# Patient Record
Sex: Male | Born: 1955
Health system: Southern US, Community
[De-identification: ages and names within clinical notes are randomized; demographics above are authoritative.]

## PROBLEM LIST (undated history)

## (undated) DIAGNOSIS — K5790 Diverticulosis of intestine, part unspecified, without perforation or abscess without bleeding: Secondary | ICD-10-CM

## (undated) DIAGNOSIS — K219 Gastro-esophageal reflux disease without esophagitis: Secondary | ICD-10-CM

## (undated) DIAGNOSIS — E785 Hyperlipidemia, unspecified: Secondary | ICD-10-CM

## (undated) DIAGNOSIS — K635 Polyp of colon: Secondary | ICD-10-CM

## (undated) DIAGNOSIS — Z800 Family history of malignant neoplasm of digestive organs: Secondary | ICD-10-CM

## (undated) DIAGNOSIS — I2510 Atherosclerotic heart disease of native coronary artery without angina pectoris: Secondary | ICD-10-CM

## (undated) DIAGNOSIS — I509 Heart failure, unspecified: Secondary | ICD-10-CM

## (undated) DIAGNOSIS — R0600 Dyspnea, unspecified: Secondary | ICD-10-CM

## (undated) DIAGNOSIS — I10 Essential (primary) hypertension: Secondary | ICD-10-CM

## (undated) DIAGNOSIS — I219 Acute myocardial infarction, unspecified: Secondary | ICD-10-CM

## (undated) DIAGNOSIS — G4733 Obstructive sleep apnea (adult) (pediatric): Secondary | ICD-10-CM

## (undated) DIAGNOSIS — I4891 Unspecified atrial fibrillation: Secondary | ICD-10-CM

## (undated) HISTORY — DX: Dyspnea, unspecified: R06.00

## (undated) HISTORY — DX: Hyperlipidemia, unspecified: E78.5

## (undated) HISTORY — DX: Essential (primary) hypertension: I10

## (undated) HISTORY — DX: Polyp of colon: K63.5

## (undated) HISTORY — DX: Family history of malignant neoplasm of digestive organs: Z80.0

## (undated) HISTORY — DX: Atherosclerotic heart disease of native coronary artery without angina pectoris: I25.10

## (undated) HISTORY — DX: Obstructive sleep apnea (adult) (pediatric): G47.33

## (undated) HISTORY — DX: Diverticulosis of intestine, part unspecified, without perforation or abscess without bleeding: K57.90

## (undated) HISTORY — DX: Heart failure, unspecified: I50.9

## (undated) HISTORY — DX: Unspecified atrial fibrillation: I48.91

## (undated) HISTORY — PX: CARDIAC CATHETERIZATION: SHX172

---
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
## 2002-03-30 ENCOUNTER — Emergency Department (HOSPITAL_COMMUNITY): Admission: EM | Admit: 2002-03-30 | Discharge: 2002-03-30 | Payer: Self-pay | Admitting: Emergency Medicine

## 2002-09-17 ENCOUNTER — Encounter: Payer: Self-pay | Admitting: Family Medicine

## 2002-09-17 ENCOUNTER — Ambulatory Visit (HOSPITAL_COMMUNITY): Admission: RE | Admit: 2002-09-17 | Discharge: 2002-09-17 | Payer: Self-pay | Admitting: Family Medicine

## 2002-12-01 ENCOUNTER — Encounter: Payer: Self-pay | Admitting: *Deleted

## 2002-12-01 ENCOUNTER — Emergency Department (HOSPITAL_COMMUNITY): Admission: EM | Admit: 2002-12-01 | Discharge: 2002-12-02 | Payer: Self-pay | Admitting: *Deleted

## 2003-09-13 ENCOUNTER — Emergency Department (HOSPITAL_COMMUNITY): Admission: EM | Admit: 2003-09-13 | Discharge: 2003-09-13 | Payer: Self-pay | Admitting: Emergency Medicine

## 2003-09-16 ENCOUNTER — Emergency Department (HOSPITAL_COMMUNITY): Admission: EM | Admit: 2003-09-16 | Discharge: 2003-09-16 | Payer: Self-pay | Admitting: Emergency Medicine

## 2003-09-20 ENCOUNTER — Emergency Department (HOSPITAL_COMMUNITY): Admission: EM | Admit: 2003-09-20 | Discharge: 2003-09-20 | Payer: Self-pay | Admitting: Emergency Medicine

## 2003-09-27 ENCOUNTER — Emergency Department (HOSPITAL_COMMUNITY): Admission: EM | Admit: 2003-09-27 | Discharge: 2003-09-27 | Payer: Self-pay | Admitting: Emergency Medicine

## 2003-10-11 ENCOUNTER — Emergency Department (HOSPITAL_COMMUNITY): Admission: EM | Admit: 2003-10-11 | Discharge: 2003-10-11 | Payer: Self-pay | Admitting: Emergency Medicine

## 2004-06-04 HISTORY — PX: CORONARY ARTERY BYPASS GRAFT: SHX141

## 2004-06-26 ENCOUNTER — Ambulatory Visit (HOSPITAL_COMMUNITY): Admission: RE | Admit: 2004-06-26 | Discharge: 2004-06-26 | Payer: Self-pay | Admitting: Family Medicine

## 2004-06-29 ENCOUNTER — Inpatient Hospital Stay (HOSPITAL_COMMUNITY): Admission: AD | Admit: 2004-06-29 | Discharge: 2004-07-05 | Payer: Self-pay | Admitting: Cardiology

## 2004-07-27 ENCOUNTER — Encounter: Admission: RE | Admit: 2004-07-27 | Discharge: 2004-07-27 | Payer: Self-pay | Admitting: Cardiology

## 2004-09-05 ENCOUNTER — Encounter (HOSPITAL_COMMUNITY): Admission: RE | Admit: 2004-09-05 | Discharge: 2004-10-05 | Payer: Self-pay | Admitting: *Deleted

## 2005-03-26 ENCOUNTER — Ambulatory Visit (HOSPITAL_COMMUNITY): Admission: RE | Admit: 2005-03-26 | Discharge: 2005-03-26 | Payer: Self-pay | Admitting: Family Medicine

## 2007-07-06 HISTORY — PX: COLONOSCOPY: SHX174

## 2007-07-16 ENCOUNTER — Encounter (INDEPENDENT_AMBULATORY_CARE_PROVIDER_SITE_OTHER): Payer: Self-pay | Admitting: General Surgery

## 2007-07-16 ENCOUNTER — Ambulatory Visit (HOSPITAL_COMMUNITY): Admission: RE | Admit: 2007-07-16 | Discharge: 2007-07-16 | Payer: Self-pay | Admitting: General Surgery

## 2007-07-16 DIAGNOSIS — K635 Polyp of colon: Secondary | ICD-10-CM

## 2007-07-16 HISTORY — DX: Polyp of colon: K63.5

## 2007-12-21 DIAGNOSIS — G4733 Obstructive sleep apnea (adult) (pediatric): Secondary | ICD-10-CM

## 2007-12-21 HISTORY — DX: Obstructive sleep apnea (adult) (pediatric): G47.33

## 2009-11-14 ENCOUNTER — Ambulatory Visit (HOSPITAL_COMMUNITY): Admission: RE | Admit: 2009-11-14 | Discharge: 2009-11-14 | Payer: Self-pay | Admitting: Cardiology

## 2009-11-17 ENCOUNTER — Ambulatory Visit (HOSPITAL_COMMUNITY): Admission: RE | Admit: 2009-11-17 | Discharge: 2009-11-17 | Payer: Self-pay | Admitting: Cardiology

## 2010-08-20 LAB — GLUCOSE, CAPILLARY
Glucose-Capillary: 120 mg/dL — ABNORMAL HIGH (ref 70–99)
Glucose-Capillary: 174 mg/dL — ABNORMAL HIGH (ref 70–99)

## 2010-10-17 NOTE — H&P (Signed)
NAME:  Francis Hall, Francis Hall                  ACCOUNT NO.:  [id number]   MEDICAL RECORD NO.:  [id number]          PATIENT TYPE:  AMB   LOCATION:  DAY                           FACILITY:  [facility]   PHYSICIAN:  Dalia Heading, M.D.  DATE OF BIRTH:  08/22/1935   DATE OF ADMISSION:  DATE OF DISCHARGE:  LH                              HISTORY & PHYSICAL   CHIEF COMPLAINT:  Need for screening colonoscopy.   HISTORY OF PRESENT ILLNESS:  The patient is a 55 year old white male who  is referred for endoscopic evaluation.  Needs colonoscopy for screening  purposes.  No abdominal pain, weight loss, nausea, vomiting, diarrhea,  constipation, melena, hematochezia have been noted.  He has never had a  colonoscopy.  There is no family history of colon carcinoma.   PAST MEDICAL HISTORY:  Includes:  1. Coronary artery disease.  2. Diabetes mellitus.   PAST SURGICAL HISTORY:  CABG 3 years ago.   CURRENT MEDICATIONS:  1. Plavix, which he will be holding.  2. Blood pressure pill.  3. Diabetic pill.   ALLERGIES:  No known drug allergies.   REVIEW OF SYSTEMS:  Noncontributory.   PHYSICAL EXAMINATION:  GENERAL:  The patient is a well-developed, well-  nourished, white male in no acute distress.  LUNGS:  Clear to auscultation with equal breath sounds bilaterally.  HEART:  Reveals regular rate and rhythm without S3, S4, or murmurs.  ABDOMEN:  Soft, nontender, nondistended.  No hepatosplenomegaly or  masses are noted.  RECTAL:  Examination was deferred to the procedure.   IMPRESSION:  Need for screening colonoscopy.   PLAN:  The patient is scheduled for a colonoscopy on July 08, 2007.  The risks and benefits of the procedure including bleeding and  perforation were fully explained to the patient, gave informed consent.      Dalia Heading, M.D.  Electronically Signed     MAJ/MEDQ  D:  06/24/2007  T:  06/24/2007  Job:  [id number]   cc:   Dalia Heading, M.D.  Fax: [phone]   Corrie Mckusick,  M.D.  Fax: [phone]

## 2010-10-20 NOTE — Discharge Summary (Signed)
NAMECERRONE, DEBOLD NO.:  [id number]   MEDICAL RECORD NO.:  [id number]          PATIENT TYPE:  INP   LOCATION:  2022                         FACILITY:  [facility]   PHYSICIAN:  Sheliah Plane, MD    DATE OF BIRTH:  27-Jul-1955   DATE OF ADMISSION:  06/29/2004  DATE OF DISCHARGE:                                 DISCHARGE SUMMARY   ADMISSION DIAGNOSIS:  Increasing shortness of breath and chest pain.   PAST MEDICAL HISTORY AND DISCHARGE DIAGNOSES:  1.  Hypertension.  2.  Hyperlipidemia.  3.  Noninsulin-dependent diabetes mellitus.  4.  History of right clavicular fracture.  5.  Status post tonsillectomy.  6.  Status post adenoidectomy.  7.  Two-vessel coronary artery disease, status post coronary artery bypass      graft x 2.  8.  Postoperative anemia.   ALLERGIES:  No known drug allergies.   BRIEF HISTORY:  The patient is a 55 year old Caucasian male with known  severe dyslipidemia, hypertension and noninsulin-dependent diabetes  mellitus, who had experienced symptoms of vague shortness of breath and  chest tightness for quite some time.  Over the past several weeks prior to  admission, however, the patient began to note that this was increasing.  Several weeks ago, the patient did have one episode where he was lifting a  tire and he became very dyspneic.  The episodes have increased since that  time, always with exertion and relieved with rest.  The patient denied  diaphoresis associated with any of these episodes.  He had an echocardiogram  performed in the past which revealed an ejection fraction of 51%, trivial  mild regurgitation and mild aortic sclerosis without stenosis.  The patient  underwent outpatient cardiac catheterization which revealed an 80% proximal  LAD lesion and a 95% circumflex lesion.  The patient's films were reviewed  with Dr. Jacinto Halim and it was Dr. Dennie Maizes opinion that the patient should  undergo surgical revascularization.   HOSPITAL COURSE:  The patient was admitted on June 29, 2004, status post  cardiac catheterization by Dr. Jacinto Halim.  Dr. Tyrone Sage of [facility] was consulted on  June 29, 2004, regarding surgical revascularization.  It was his opinion  that the patient should undergo coronary artery bypass grafting and this was  subsequently scheduled.  The patient was maintained on routine hospital  care.   The patient was taken to the OR on June 30, 2004, for coronary artery  bypass grafting x 2.  The left radial artery was harvested to the  circumflex.  The left internal mammary artery was grafted to the LAD.  The  patient tolerated the procedure well and was hemodynamically stable  immediately postoperatively.  The patient was transferred from the OR to the  SICU in stable condition.  The patient was extubated without complication  and woke up from anesthesia neurologically intact.   The patient was started on Imdur secondary to his radial artery harvest.  The patient's postoperative course has progressed as expected.  He has done  quite well postoperatively.  On postoperative day #1, he was without  complaint and awake and alert.  He was afebrile with stable vital signs and  maintaining a normal sinus rhythm.  There was no bleeding evident, however,  his hematocrit was noted to be 28.  His chest x-ray was stable.  The chest  tube and basal lines were discontinued in routine manner without  complication.  The patient began cardiac rehabilitation on postoperative day  #2 and has increased his tolerance to a satisfactory level.  The patient has  been volume overloaded postoperatively and has been diuresed accordingly.  The patient was transferred from the SICU to unit 2000 on postoperative day  #3, all without complication.  On postoperative day #4, the patient is  afebrile with stable vital signs and maintaining a normal sinus rhythm.  His  diabetes mellitus has been well controlled postoperatively.  He  is on his  home regimen of metformin and Actos.  He was initially controlled with  Lantus insulin and this was subsequently discontinued.  The patient does  continue to remain anemic postoperatively with a hemoglobin of 8.6.  Clinically, however, he is stable and will be started on an iron supplement.  On physical exam, the lungs are clear.  Cardiac is regular rate and rhythm  without murmur, rub or gallop. The abdomen is benign.  There is no edema  present in the bilateral lower extremities.  The left arm is neurologically  intact.  There is good grip present.  The incisions are healing well.  The  patient is making steady progress and is in stable condition at this time.  As long as he continues to progress in the current manner, he will be stable  for discharge in the next one to two days pending morning round  reevaluation.   LABORATORIES:  CBC and BMP on July 04, 2004, with white count 5.4,  hemoglobin 8.6, hematocrit 24.7, platelets 228, sodium 140, potassium 4.1,  BUN 28, creatinine 1.1 and glucose 153.   CONDITION ON DISCHARGE:  Improved.   DISCHARGE MEDICATIONS:  1.  Aspirin 81 mg p.o. daily.  2.  Coreg 25 mg b.i.d.  3.  Lasix 40 mg daily x 5 days.  4.  K-Dur 20 mEq daily x 5 days.  5.  Zocor 10 mg daily.  6.  Metformin 850 mg b.i.d.  7.  Tricor 145 mg daily.  8.  Actos 50 mg b.i.d.  9.  Plavix 75 mg daily.  10. Imdur 30 mg daily.  11. Iron 150 mg supplement b.i.d.  12. Folic acid 2 mg daily.  13. Tylox one to two p.o. q.4-6h. p.r.n. pain.   ACTIVITY:  No driving.  No lifting more than 10 pounds.  The patient is to  continue daily breathing and walking exercises.   DIET:  Low salt, low fat, carbohydrate modified, medium calorie.   WOUND CARE:  The patient may shower daily and clean the incisions with soap  and water.  If the incisions become red, swollen or drain or if the patient has a fever of 101 degrees Fahrenheit, he will be instructed to contact the  [facility]  office.   FOLLOWUP:  1.  Follow up with Dr. Jacinto Halim two weeks after discharge.  The patient will be      instructed to contact his office to      arrange this appointment.  The patient will have a chest x-ray taken at      the time of that appointment, which he will be instructed to bring with  him to the appointment with Dr. Tyrone Sage.  2.  Dr. Tyrone Sage on July 27, 2004, at 11 a.m.      AY/MEDQ  D:  07/04/2004  T:  07/04/2004  Job:  [id number]   cc:   Cristy Hilts. Jacinto Halim, MD  [address]  [place]  [place] [address]  Fax: [phone]   Dani Gobble, MD  Fax: [phone]

## 2010-10-20 NOTE — H&P (Signed)
[name], [name]                  ACCOUNT NO.:  [id number]   MEDICAL RECORD NO.:  [id number]          PATIENT TYPE:  REC   LOCATION:  CREH                          FACILITY:  [facility]   PHYSICIAN:  Ines Bloomer, M.D. DATE OF BIRTH:  1955/08/20   DATE OF ADMISSION:  12/26/2004  DATE OF DISCHARGE:                                HISTORY & PHYSICAL   HISTORY OF PRESENT ILLNESS:  Dictation Ended.       DPB/MEDQ  D:  12/25/2004  T:  12/26/2004  Job:  [id number]

## 2010-10-20 NOTE — Consult Note (Signed)
NAMESHAYDON, LEASE NO.:  [id number]   MEDICAL RECORD NO.:  [id number]          PATIENT TYPE:  INP   LOCATION:  3728                         FACILITY:  [facility]   PHYSICIAN:  Sheliah Plane, MD    DATE OF BIRTH:  February 14, 1956   DATE OF CONSULTATION:  06/29/2004  DATE OF DISCHARGE:                                   CONSULTATION   REQUESTING PHYSICIAN:  Dr. Jacinto Halim.   CARDIOLOGIST:  Dr. Domingo Sep in [place].   PRIMARY CARE PHYSICIAN:  Dr. Renette Butters, [facility].   REASON FOR CONSULTATION:  Coronary artery disease with unstable angina.   HISTORY OF PRESENT ILLNESS:  Patient is a 55 year old with known severe  dyslipidemia, who has had over a year of vague shortness of breath and chest  tightness; however, over the past several weeks, he has noted increasing  symptoms.  It first became worse when he was lifting a tire off his  motorcycle, put it in his car, and became very short of breath.  Since that  time, several weeks ago, he has had increasing episodes of shortness of  breath, always with exertion, relieving with rest.  The shortness of breath  is also accompanied with mild chest tightness. No diaphoresis.  He denies  any previous history of myocardial infarction.  He denies resting shortness  of breath.  He has had an echocardiogram in the past that showed an ejection  fraction of 51%, trivial mitral regurgitation, mild aortic sclerosis without  stenosis.  He had an exercise test in March, 2005, which is a low risk  study.   Cardiac risk factors include hypertension, severe hyperlipidemia, marked  positive family history with his father having cardiac disease, his sister  having had angioplasties done at age 80.  Also, his mother's brothers have  all had cardiac disease.  He denies claudication.  He denies renal  insufficiency.   PAST MEDICAL HISTORY:  He has had  no history of right clavicular fracture.   Previous surgery includes tonsillectomy and  adenoidectomy.   SOCIAL HISTORY:  [place]  He works Retail banker.  Denies alcohol use.  Denies smoking.  Is currently employed,  although his job does not involve heavy lifting.   MEDICATIONS:  1.  Benicar 40 mg a day.  2.  Ibuprofen 800 mg b.i.d.  3.  Coreg 25 b.i.d.  4.  Tricor 145 mg q.d.  5.  Fish oil.  6.  Zocor 10 mg a day.  7.  Vitamin C.  8.  Multivitamin.  9.  Cartia XT 120 mg a day.  10. Actos/Metformin 15/850 b.i.d.   DRUG ALLERGIES:  None known.   REVIEW OF SYSTEMS:  Positive for exertional shortness of breath and chest  pain.  He denies lower extremity edema, palpitations, syncope, presyncope,  orthopnea, or resting shortness of breath. General review of systems is  negative for weight gain or loss.  RESPIRATORY:  Without hemoptysis.  GASTROINTESTINAL:  He has had a history of sigmoid colonic diverticulitis in  the past without bleeding.  NEUROLOGIC:  He  denies TIAs or amaurosis.  MUSCULOSKELETAL:  Mild spur formation of the spine and osteoarthritis in the  hips and knees.  GU:  History of kidney stones in the past.  The last one in  June, 2004.  He denies any recent infections.  He has a history of anemia.  He has known diabetes x10 years.  Treated for the past three.  He denies a  psychiatric history.  Notes decreased hearing on the right.   PHYSICAL EXAMINATION:  VITAL SIGNS:  Blood pressure 139/78, pulse 87,  respiratory rate 18, O2 sat is 96% on room air.  Height 68 inches tall.  Weight is 206.2 pounds.  GENERAL:  Patient is a moderately obese male who is awake and alert,  neurologically intact.  HEENT:  Pupils are equal, round and reactive to light.  NECK:  Without carotid bruits.  LUNGS:  Clear.  CARDIAC:  Regular rhythm without murmur.  ABDOMEN:  Obese without palpable masses or tenderness.  EXTREMITIES:  There are 2+ DP/PT pulses.  He appears to have adequate veins  in both lower extremities.  Patient is  right-handed.  He has a negative  Allen's test bilaterally.   LABORATORY FINDINGS:  Hematocrit of 33.9, cholesterol 385, triglycerides  996, HDL 16, LDL 83.  Creatinine is 0.9.   Chest CT shows no pulmonary embolus.   Homocysteine levels were elevated to 16.88.   Patient's cardiac cath films done at an outpatient facility and reviewed  showed an 80% proximal LAD at the take-off of the diagonal.  The circumflex  has a proximal to mid stenosis of 95%, probably the culprit lesion.  The  right coronary artery has some luminal irregularities distally but without  significant stenosis.   Carotid Doppler studies show normal Dopplers in the palmar arches with  radial compression decreased 50% with ulnar compression.  There is no  significant carotid stenosis.   Patient's films are reviewed with Dr. Jacinto Halim.  The circumflex could be  angioplastied.  The LAD proximal lesion is very close to a take-off of a  diagonal coronary artery, which would make angioplasty difficult.  Coronary  artery bypass grafting is recommended to the patient.  The risks of surgery,  including death, infection, stroke, myocardial infarction, bleeding, blood  transfusion were all discussed.  Also discussed the possible use of radial  artery and the possibility of neurovascular injury to the left hand.  The  patient's palmar arch appears to have adequate flow, based on the ulnar  only.  The patient is agreeable with proceeding with planned surgery for  January 27th.     EG/MEDQ  D:  06/29/2004  T:  06/29/2004  Job:  [id number]

## 2010-10-20 NOTE — Op Note (Signed)
Francis Hall, Francis Hall NO.:  [id number]   MEDICAL RECORD NO.:  [id number]          PATIENT TYPE:  INP   LOCATION:  2022                         FACILITY:  [facility]   PHYSICIAN:  Sheliah Plane, MD    DATE OF BIRTH:  [birth year]   DATE OF PROCEDURE:  06/30/2004  DATE OF DISCHARGE:                                 OPERATIVE REPORT   PREOPERATIVE DIAGNOSIS:  Coronary occlusive disease with unstable angina.   POSTOPERATIVE DIAGNOSIS:  Coronary occlusive disease with unstable angina.   PROCEDURE:  Coronary artery bypass grafting x 2 with left internal mammary  to the left anterior descending coronary artery and the left radial artery  harvesting and bypass to the circumflex coronary artery.   SURGEON:  Sheliah Plane, M.D.   FIRST ASSISTANT:  Salvatore Decent. Dorris Fetch, M.D.   SECOND ASSISTANTS:  Pecola Leisure, P.A.  Rowe Clack, P.A.-C.   BRIEF HISTORY:  The patient is a 55 year old male who presents with unstable  anginal symptoms and underwent cardiac catheterization by Dr. Yates Decamp, as  an outpatient.  Because of the patient's unstable anginal symptoms and  greater than 95% stenosis of the circumflex coronary artery and an 80%  stenosis of the LAD, he was admitted to [facility] and cardiac surgery  consultation was obtained.   The patient has had longstanding diabetes, had luminal irregularities in the  right coronary artery but no high-grade stenosis.  Because of the high-grade  nature of the circumflex and the LAD lesion, and the complexity of the LAD  lesion, he was not felt to be a suitable candidate for angioplasty.  Coronary artery bypass grafting was recommended.  The patient agreed and  signed informed consent.   DESCRIPTION OF PROCEDURE:  With Swan-Ganz and arterial monitors in place,  the patient underwent general endotracheal anesthesia without difficulty.  The skin of the chest, legs and left arm was prepped with Betadine.  The  patient had a  negative Allen's test in the left hand.  Initially using a  curvilinear incision over the lower aspect of the left forearm, the radial  artery was identified and dissected free using clips and harmonic scalpel to  divide the small side branches.  The radial artery was of excellent quality  and caliber.  Prior to harvesting the vessel, the temporary bulldog was  placed, and there was preserved flow in the palmar arch.  The radial artery  was flushed with heparinized saline, papaverine solution.  The forearm was  then closed with running 2-0 Vicryl in the subcutaneous tissue and skin  staples in the skin edges.  Median sternotomy was performed.  The left  internal mammary artery was dissected down as a pedicle graft and the distal  artery had good free-flow.  Pericardium was opened.  Overall ventricular  function appeared preserved.  The patient was systemically heparinized.  Ascending aorta and the right atrium were cannulated and the aortic root  vented for cardioplegia, needle was introduced into the ascending aorta.  The patient was placed on cardiopulmonary bypass, 2.4 liters for a minute  per  meter squared, sites for anastomosis were selected and dissected out of  the epicardium.  Both the LAD and the circumflex were partially  intramyocardial but were able to be located.  The patient's body temperature  was allowed to drift, the aortic cross-clamp was applied and 500 mL of cold-  blood potassium-cardioplegia was administered with rapid diastolic arrest of  the heart.  Myocardial septal temperatures were monitored throughout the  cross-clamp period.   Attention was turned first to the circumflex coronary artery which was  opened and admitted a 1.5-mm probe.  Using a running 8-0 Prolene, the left  radial artery was anastomosed to the circumflex coronary artery.  Additional  blood cardioplegia was administered down the radial artery.  Attention was  then turned to the left anterior  descending coronary artery which was opened  and admitted a 1.5-mm probe.  Using a running 8-0 Prolene, the left internal  mammary artery was anastomosed to the left anterior descending coronary  artery.  With release of Edwards bulldog on the mammary artery, there was  appropriate rise in  the myocardial septal temperature; the aortic cross  clamp was removed for a total cross-clamp time 40 minutes.  Partial-  occlusion clamp was placed on the ascending aorta.  A single-punch aortotomy  was performed and using a running 7-0 Prolene, the radial artery was  anastomosed to the ascending aorta.  Air was evacuated from the graft and  partial-occlusion clamp was removed.  Sites for anastomoses were inspected  and were free of bleeding, and patient was then ventilated and weaned from  cardiopulmonary bypass without difficulty.  The patient remained  hemodynamically stable and was decannulated in the usual fashion.  Protamine  sulfate was administered with the operative field hemostatic.  Two atrial  and two ventricular pacing wires applied.  Graft marker applied in the left  pleural tube, two mediastinal tubes left in place, sternum was closed with  #6 stainless steel wire, fascia closed interrupted 0 Vicryl, running 3-0  Vicryl on the subcutaneous tissue, 4-0 subcuticular stitch in skin edges.  Dry dressings were applied.  Sponge and needle counts were reported correct  at the completion of the procedure.  The patient tolerated the procedure  without obvious complications and was transferred to surgical intensive care  unit for further postoperative care.  Sponge and needle counts were reported  as correct.  Total pump time was 86 minutes.      EG/MEDQ  D:  07/03/2004  T:  07/03/2004  Job:  [id number]   cc:   Cristy Hilts. Jacinto Halim, MD  [address]  [place]  [place] [address]  Fax: [phone]

## 2010-11-08 DIAGNOSIS — I509 Heart failure, unspecified: Secondary | ICD-10-CM

## 2010-11-08 HISTORY — DX: Heart failure, unspecified: I50.9

## 2010-11-27 ENCOUNTER — Encounter: Payer: Self-pay | Admitting: Urgent Care

## 2010-11-27 ENCOUNTER — Ambulatory Visit (INDEPENDENT_AMBULATORY_CARE_PROVIDER_SITE_OTHER): Payer: 59 | Admitting: Urgent Care

## 2010-11-27 VITALS — BP 133/71 | HR 69 | Temp 98.5°F | Ht 68.0 in | Wt 215.4 lb

## 2010-11-27 DIAGNOSIS — K635 Polyp of colon: Secondary | ICD-10-CM | POA: Insufficient documentation

## 2010-11-27 DIAGNOSIS — D126 Benign neoplasm of colon, unspecified: Secondary | ICD-10-CM

## 2010-11-27 DIAGNOSIS — Z800 Family history of malignant neoplasm of digestive organs: Secondary | ICD-10-CM

## 2010-11-27 NOTE — Assessment & Plan Note (Signed)
Francis Hall is a 55 y.o. caucasian male w/ last colonoscopy 2009.  Pathology revealed a single hyperplastic polyp removed in the setting of a complete colonosocpy w/ Dr Lovell Sheehan w/ a good prep.  This does not affect his risk for colorectal ca.  He does, however, have a family hx of colon cancer that will need to be considered in his risk-stratification.

## 2010-11-27 NOTE — Progress Notes (Signed)
Referring Provider: Assunta Found Primary Care Physician:  Assunta Found Primary Gastroenterologist:  Dr. Darrick Penna  Chief Complaint  Patient presents with  . Colonoscopy    Hx polyp, FH colon ca    HPI:  Francis Hall is a 55 y.o. male here as a referral from Dr. Phillips Odor for hx colon polyp removed in 2009.  Polyp removed from 30cm by Dr Lovell Sheehan and was found to be hyperplastic.  +FH colon ca dx w/ his father in his 63's.  Denies abd pain, nausea, vomiting, anorexia.  Denies constipation, diarrhea, rectal bleeding or melena.  Wt stable.  Past Medical History  Diagnosis Date  . Hyperplastic colon polyp 07/16/07    30cm   . Diabetes mellitus   . CAD (coronary artery disease)     [facility]  . Hyperlipemia   . HTN (hypertension)   . Family hx of colon cancer   . Diverticulosis    Past Surgical History  Procedure Date  . Coronary artery bypass graft 06/2004  . Tonsillectomy 1962   Current Outpatient Prescriptions  Medication Sig Dispense Refill  . aspirin 81 MG EC tablet Take 81 mg by mouth daily.        [name] atorvastatin (LIPITOR) 40 MG tablet Take 40 mg by mouth daily.        . carvedilol (COREG) 25 MG tablet Take 25 mg by mouth 2 (two) times daily with a meal.        . clopidogrel (PLAVIX) 75 MG tablet Take 75 mg by mouth daily.        [name] diltiazem (CARDIZEM CD) 180 MG 24 hr capsule Take 180 mg by mouth daily.        [name] lisinopril (PRINIVIL,ZESTRIL) 40 MG tablet Take 40 mg by mouth daily.        . Multiple Vitamin (MULTIVITAMIN) capsule Take 1 capsule by mouth daily.        [name] omega-3 acid ethyl esters (LOVAZA) 1 G capsule Take 2 g by mouth 2 (two) times daily.        . pioglitazone-metformin (ACTOPLUS MET) 15-850 MG per tablet Take 1 tablet by mouth 2 (two) times daily with a meal.        . sitaGLIPtin (JANUVIA) 100 MG tablet Take 100 mg by mouth daily.        . vitamin C (ASCORBIC ACID) 500 MG tablet Take 500 mg by mouth daily.         Allergies as of 11/27/2010  . (No Known  Allergies)   Family History  Problem Relation Age of Onset  . Colon cancer Father     62's?  . Lung cancer Father    History   Social History  . Marital Status: Married    Spouse Name: N/A    Number of Children: 2  . Years of Education: N/A   Occupational History  . Curator     Social History Main Topics  . Smoking status: Never Smoker   . Smokeless tobacco: Not on file  . Alcohol Use: No     social-2 beers/wk  . Drug Use: No  . Sexually Active: Not on file   Review of Systems: Gen: Denies any fever, chills, sweats, anorexia, fatigue, weakness, malaise, weight loss, and sleep disorder CV: Denies chest pain, angina, palpitations, syncope, orthopnea, PND, peripheral edema, and claudication. Resp: Denies dyspnea at rest, dyspnea with exercise, cough, sputum, wheezing, coughing up blood, and pleurisy. GI: Denies vomiting blood, jaundice, and fecal incontinence.  Denies dysphagia or odynophagia. GU : Denies urinary burning, blood in urine, urinary frequency, urinary hesitancy, nocturnal urination, and urinary incontinence. MS: Denies joint pain, limitation of movement, and swelling, stiffness, low back pain, extremity pain. Denies muscle weakness, cramps, atrophy.  Derm: Denies rash, itching, dry skin, hives, moles, warts, or unhealing ulcers.  Psych: Denies depression, anxiety, memory loss, suicidal ideation, hallucinations, paranoia, and confusion. Heme: Denies bruising, bleeding, and enlarged lymph nodes.  Physical Exam: BP 133/71  Pulse 69  Temp(Src) 98.5 F (36.9 C) (Temporal)  Ht 5\' 8"  (1.727 m)  Wt 215 lb 6.4 oz (97.705 kg)  BMI 32.75 kg/m2 General:   Alert,  Well-developed, well-nourished, pleasant and cooperative in NAD Head:  Normocephalic and atraumatic. Eyes:  Sclera clear, no icterus.   Conjunctiva pink. Ears:  Normal auditory acuity. Nose:  No deformity, discharge,  or lesions. Mouth:  No deformity or lesions, dentition normal. Neck:  Supple; no masses  or thyromegaly. Lungs:  Clear throughout to auscultation.   No wheezes, crackles, or rhonchi. No acute distress. Heart:  Regular rate and rhythm; no murmurs, clicks, rubs,  or gallops. Abdomen:  Soft, nontender and nondistended. No masses, hepatosplenomegaly or hernias noted. Normal bowel sounds, without guarding, and without rebound.   Rectal:  Deferred until time of colonoscopy.   Msk:  Symmetrical without gross deformities. Normal posture. Pulses:  Normal pulses noted. Extremities:  Without clubbing or edema. Neurologic:  Alert and  oriented x4;  grossly normal neurologically. Skin:  Intact without significant lesions or rashes. Cervical Nodes:  No significant cervical adenopathy. Psych:  Alert and cooperative. Normal mood and affect.

## 2010-11-27 NOTE — Assessment & Plan Note (Signed)
Next colonoscopy 2/20214 or sooner if he develops any red flags.    Thank you for your referral of Francis Hall for our care.

## 2010-11-30 NOTE — Progress Notes (Signed)
Cc to PCP 

## 2010-12-01 NOTE — Progress Notes (Signed)
Agree TCS q5years

## 2012-03-08 ENCOUNTER — Encounter (HOSPITAL_COMMUNITY): Payer: Self-pay | Admitting: Emergency Medicine

## 2012-03-08 ENCOUNTER — Emergency Department (HOSPITAL_COMMUNITY)
Admission: EM | Admit: 2012-03-08 | Discharge: 2012-03-08 | Disposition: A | Discharge status: Home / Self Care | Payer: BC Managed Care – PPO | Attending: Emergency Medicine | Admitting: Emergency Medicine

## 2012-03-08 ENCOUNTER — Emergency Department (HOSPITAL_COMMUNITY): Payer: BC Managed Care – PPO

## 2012-03-08 DIAGNOSIS — Z794 Long term (current) use of insulin: Secondary | ICD-10-CM | POA: Insufficient documentation

## 2012-03-08 DIAGNOSIS — S52201A Unspecified fracture of shaft of right ulna, initial encounter for closed fracture: Secondary | ICD-10-CM

## 2012-03-08 DIAGNOSIS — S52509A Unspecified fracture of the lower end of unspecified radius, initial encounter for closed fracture: Secondary | ICD-10-CM | POA: Insufficient documentation

## 2012-03-08 DIAGNOSIS — Z7982 Long term (current) use of aspirin: Secondary | ICD-10-CM | POA: Insufficient documentation

## 2012-03-08 DIAGNOSIS — S52609A Unspecified fracture of lower end of unspecified ulna, initial encounter for closed fracture: Secondary | ICD-10-CM | POA: Insufficient documentation

## 2012-03-08 DIAGNOSIS — Z951 Presence of aortocoronary bypass graft: Secondary | ICD-10-CM | POA: Insufficient documentation

## 2012-03-08 DIAGNOSIS — E785 Hyperlipidemia, unspecified: Secondary | ICD-10-CM | POA: Insufficient documentation

## 2012-03-08 DIAGNOSIS — E119 Type 2 diabetes mellitus without complications: Secondary | ICD-10-CM | POA: Insufficient documentation

## 2012-03-08 DIAGNOSIS — Z79899 Other long term (current) drug therapy: Secondary | ICD-10-CM | POA: Insufficient documentation

## 2012-03-08 DIAGNOSIS — I2510 Atherosclerotic heart disease of native coronary artery without angina pectoris: Secondary | ICD-10-CM | POA: Insufficient documentation

## 2012-03-08 DIAGNOSIS — I10 Essential (primary) hypertension: Secondary | ICD-10-CM | POA: Insufficient documentation

## 2012-03-08 MED ORDER — OXYCODONE-ACETAMINOPHEN 5-325 MG PO TABS
1.0000 | ORAL_TABLET | Freq: Once | ORAL | Status: AC
  Administered 2012-03-08: 1 via ORAL
  Filled 2012-03-08: qty 1

## 2012-03-08 MED ORDER — OXYCODONE-ACETAMINOPHEN 5-325 MG PO TABS: 1.0000 | ORAL_TABLET | Freq: Four times a day (QID) | ORAL | Status: DC | PRN

## 2012-03-08 NOTE — ED Notes (Signed)
Obvious swelling right wrist, any movement causes severe pain, ice pack applied.
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
## 2012-03-11 ENCOUNTER — Other Ambulatory Visit (HOSPITAL_COMMUNITY): Payer: Self-pay | Admitting: Urology

## 2012-03-12 ENCOUNTER — Encounter: Payer: Self-pay | Admitting: Orthopedic Surgery

## 2012-03-12 ENCOUNTER — Ambulatory Visit (INDEPENDENT_AMBULATORY_CARE_PROVIDER_SITE_OTHER): Payer: BC Managed Care – PPO | Admitting: Orthopedic Surgery

## 2012-03-12 VITALS — BP 140/70 | Ht 68.0 in | Wt 205.0 lb

## 2012-03-12 DIAGNOSIS — S52599A Other fractures of lower end of unspecified radius, initial encounter for closed fracture: Secondary | ICD-10-CM

## 2012-03-12 DIAGNOSIS — S52509A Unspecified fracture of the lower end of unspecified radius, initial encounter for closed fracture: Secondary | ICD-10-CM

## 2012-03-12 MED ORDER — HYDROCODONE-ACETAMINOPHEN 7.5-325 MG PO TABS: 1.0000 | ORAL_TABLET | Freq: Four times a day (QID) | ORAL | Status: DC | PRN

## 2012-03-12 NOTE — Patient Instructions (Addendum)
Keep  Cast dry   Do not get wet   If it gets wet dry with a hair dryer on low setting and call the office   

## 2012-03-12 NOTE — Progress Notes (Signed)
Patient ID: Francis Hall, male   DOB: [birth year], 56 y.o.   MRN: [id number] Chief Complaint  Patient presents with  . Wrist Injury    right wrist fracture, DOI 03/08/12   Fell at home, 03/08/2012. Injury, dull pain, 2/10, constant, swelling.  ROS: normal  The patient's allergies are recorded, the medical and surgical history have been recorded, medications family history and social history have been recorded and all have been reviewed.  Vital signs are stable as recorded  General appearance is normal  The patient is alert and oriented x3  The patient's mood and affect are normal  Gait assessment: normal  The cardiovascular exam reveals normal pulses and temperature without edema swelling.  The lymphatic system is negative for palpable lymph nodes  The sensory exam is normal.  There are no pathologic reflexes.  Balance is normal.   Exam of the right wrist: Inspection swelling tenderness radial and ulnar Range of motion decreased  Stability normal  Strength deferred Skin normal   Elbow and shoulder normal   xrays [facility]: non displaced intra-articular radius fracture and distal ulna fracture   xrays OOP in 4 weeks

## 2012-03-19 ENCOUNTER — Ambulatory Visit: Payer: BC Managed Care – PPO | Admitting: Orthopedic Surgery

## 2012-04-09 ENCOUNTER — Encounter: Payer: Self-pay | Admitting: Orthopedic Surgery

## 2012-04-09 ENCOUNTER — Ambulatory Visit (INDEPENDENT_AMBULATORY_CARE_PROVIDER_SITE_OTHER): Payer: BC Managed Care – PPO

## 2012-04-09 ENCOUNTER — Ambulatory Visit (INDEPENDENT_AMBULATORY_CARE_PROVIDER_SITE_OTHER): Payer: BC Managed Care – PPO | Admitting: Orthopedic Surgery

## 2012-04-09 VITALS — Ht 68.0 in | Wt 205.0 lb

## 2012-04-09 DIAGNOSIS — S62109A Fracture of unspecified carpal bone, unspecified wrist, initial encounter for closed fracture: Secondary | ICD-10-CM

## 2012-04-09 NOTE — Progress Notes (Signed)
Patient ID: Francis Hall, male   DOB: [birth year], 56 y.o.   MRN: [id number] Chief Complaint  Patient presents with  . Follow-up    4 week recheck on right wrist fracture.    X-rays out of plaster. Fracture is holding nicely.  Patient is placed in a brace that he brought from home.  X-ray again in 4 weeks

## 2012-04-09 NOTE — Patient Instructions (Signed)
Wear brace except for bathing  

## 2012-05-07 ENCOUNTER — Ambulatory Visit (INDEPENDENT_AMBULATORY_CARE_PROVIDER_SITE_OTHER): Payer: BC Managed Care – PPO

## 2012-05-07 ENCOUNTER — Ambulatory Visit (INDEPENDENT_AMBULATORY_CARE_PROVIDER_SITE_OTHER): Payer: BC Managed Care – PPO | Admitting: Orthopedic Surgery

## 2012-05-07 ENCOUNTER — Encounter: Payer: Self-pay | Admitting: Orthopedic Surgery

## 2012-05-07 VITALS — Ht 68.0 in | Wt 205.0 lb

## 2012-05-07 DIAGNOSIS — S62109A Fracture of unspecified carpal bone, unspecified wrist, initial encounter for closed fracture: Secondary | ICD-10-CM

## 2012-05-07 NOTE — Progress Notes (Signed)
Patient ID: Francis Hall, male   DOB: 07/08/1955, 56 y.o.   MRN: [id number] Chief Complaint  Patient presents with  . Follow-up    4 week recheck on right wrist with xray. DOI 03/08/12    1. Wrist fracture  DG Wrist Complete Right    xrays show fracture healing   Clinically exam is good except for stiffness  Wean brace   Exercise ball as tolerated   Prn

## 2012-05-07 NOTE — Patient Instructions (Addendum)
Wean yourself from the brace   Exercise ball daily

## 2012-07-31 ENCOUNTER — Ambulatory Visit (INDEPENDENT_AMBULATORY_CARE_PROVIDER_SITE_OTHER): Payer: BC Managed Care – PPO | Admitting: Urgent Care

## 2012-07-31 ENCOUNTER — Encounter: Payer: Self-pay | Admitting: Urgent Care

## 2012-07-31 VITALS — BP 155/80 | HR 55 | Temp 98.0°F | Ht 68.0 in | Wt 213.8 lb

## 2012-07-31 DIAGNOSIS — Z800 Family history of malignant neoplasm of digestive organs: Secondary | ICD-10-CM

## 2012-07-31 MED ORDER — PEG 3350-KCL-NA BICARB-NACL 420 G PO SOLR: 4000.0000 mL | ORAL | Status: DC

## 2012-07-31 NOTE — Assessment & Plan Note (Addendum)
Francis Hall is a pleasant 57 y.o. male due for high risk screening colonoscopy with Dr Darrick Penna given family hx of colon cancer.  He had a single hyperplastic polyp on last colonoscopy by Dr Lovell Sheehan in 2009.  He will remain on plavix & aspirin since this is a screening exam.  We did discuss the fact that he is at a slightly higher risk of GI bleeding given the fact that he is on aspirin & plavix, however the benefits of remaining on the medications outweigh the risk of bleeding. We discussed the life threatening nature of an embolic event. He agrees to remain on aspirin & plavix for this procedure. He also understands that a second procedure may be required if he shows signs of significant bleeding or is in need of significant intervention that cannot be performed while on plavix & aspirin. Other risks discussed include reaction to the medication, perforation, and infection. He agrees with all the above and consent will be obtained.  Take half of your diabetes medications (amaryl & glucophage) the day prior to the procedure Take 1/2 of your lantus insulin (35 units) the night prior to your procedure Hold lunch & evening dose of slide scale insulin the night before your procedure You will have an early morning appointment-Hold all diabetes medications day of procedure Bring all your medications and/or any insulin to the hospital the day of the procedure. Follow blood sugars, call us or your PCP if any problems.

## 2012-07-31 NOTE — Patient Instructions (Addendum)
Take half of your diabetes medications (amaryl & glucophage) the day prior to the procedure Take 1/2 of your lantus insulin (35 units) the night prior to your procedure Hold lunch & evening dose of slide scale insulin the night before your procedure You will have an early morning appointment-Hold all diabetes medications day of procedure Bring all your medications and/or any insulin to the hospital the day of the procedure. Follow blood sugars, call us or your PCP if any problems.

## 2012-08-01 NOTE — Progress Notes (Signed)
Referring Provider: Assunta Found Primary Care Physician:  Assunta Found Primary Gastroenterologist:  Dr. Darrick Penna  Chief Complaint  Patient presents with  . Colonoscopy    FH colon ca    HPI:  Francis Hall is a 57 y.o. male here to set up high risk screening colonoscopy.  Hx hyperplastic colon polyp removed in 2009 by Dr Lovell Sheehan.  Family hx  colon ca dx w/ his father in his 78's.  Denies abd pain, nausea, vomiting, anorexia.  Denies constipation, diarrhea, rectal bleeding or melena.  Wt stable.  He is on aspirin & plavix for CAD s/p CABG & has hx atrial fibrillation.    Past Medical History  Diagnosis Date  . Hyperplastic colon polyp 07/16/07    30cm   . Diabetes mellitus     [facility]  . CAD (coronary artery disease)     [facility]  . Hyperlipemia   . HTN (hypertension)   . Family hx of colon cancer   . Diverticulosis   . Atrial fibrillation    Past Surgical History  Procedure Laterality Date  . Coronary artery bypass graft  06/2004  . Tonsillectomy  1962  . Colonoscopy  07/2007    Dr Lovell Sheehan: hyperplastic polyp 30cm   Current Outpatient Prescriptions  Medication Sig Dispense Refill  . aspirin 81 MG EC tablet Take 81 mg by mouth daily.        [name] atorvastatin (LIPITOR) 40 MG tablet Take 20 mg by mouth daily.       . Canagliflozin (INVOKANA) 100 MG TABS Take by mouth.      . carvedilol (COREG) 25 MG tablet Take 25 mg by mouth 2 (two) times daily with a meal.        . clopidogrel (PLAVIX) 75 MG tablet Take 75 mg by mouth daily.        [name] diltiazem (CARDIZEM CD) 180 MG 24 hr capsule Take 180 mg by mouth daily.        . furosemide (LASIX) 20 MG tablet Take 20 mg by mouth 2 (two) times daily.      [name] glimepiride (AMARYL) 2 MG tablet Take 2 mg by mouth daily before breakfast.      . hydrochlorothiazide (MICROZIDE) 12.5 MG capsule Take 12.5 mg by mouth daily.      [name] HYDROcodone-acetaminophen (NORCO) 7.5-325 MG per tablet Take 1 tablet by mouth every 6 (six) hours as needed for pain.  56  tablet  2  . insulin aspart (NOVOLOG) 100 UNIT/ML injection Inject 10 Units into the skin 3 (three) times daily before meals.      . insulin glargine (LANTUS) 100 UNIT/ML injection Inject 70 Units into the skin at bedtime.      [name] lisinopril (PRINIVIL,ZESTRIL) 40 MG tablet Take 40 mg by mouth daily.        . meloxicam (MOBIC) 7.5 MG tablet Take 7.5 mg by mouth daily.      . metFORMIN (GLUCOPHAGE) 1000 MG tablet Take 1,000 mg by mouth 2 (two) times daily with a meal.      . Multiple Vitamin (MULTIVITAMIN) capsule Take 1 capsule by mouth daily.        [name] omega-3 acid ethyl esters (LOVAZA) 1 G capsule Take 2 g by mouth 2 (two) times daily.        . polyethylene glycol-electrolytes (TRILYTE) 420 G solution Take 4,000 mLs by mouth as directed.  4000 mL  0   No current facility-administered medications for this visit.   Allergies as  of 07/31/2012  . (No Known Allergies)   Family History  Problem Relation Age of Onset  . Colon cancer Father     81's?  . Lung cancer Father   . Arthritis    . Heart disease    . Diabetes     History   Social History  . Marital Status: Married    Spouse Name: N/A    Number of Children: 2  . Years of Education: N/A   Occupational History  . Curator     Social History Main Topics  . Smoking status: Never Smoker   . Smokeless tobacco: Not on file  . Alcohol Use: No     social-2 beers/wk  . Drug Use: No  . Sexually Active: Not on file   Review of Systems: Gen: Denies any fever, chills, sweats, anorexia, fatigue, weakness, malaise, weight loss, and sleep disorder CV: Denies chest pain, angina, palpitations, syncope, orthopnea, PND, peripheral edema, and claudication. Resp: Denies dyspnea at rest, dyspnea with exercise, cough, sputum, wheezing, coughing up blood, and pleurisy. GI: Denies vomiting blood, jaundice, and fecal incontinence.   Denies dysphagia or odynophagia. GU : Denies urinary burning, blood in urine, urinary frequency, urinary hesitancy,  nocturnal urination, and urinary incontinence. MS: Denies joint pain, limitation of movement, and swelling, stiffness, low back pain, extremity pain. Denies muscle weakness, cramps, atrophy.  Derm: Denies rash, itching, dry skin, hives, moles, warts, or unhealing ulcers.  Psych: Denies depression, anxiety, memory loss, suicidal ideation, hallucinations, paranoia, and confusion. Heme: Denies bruising, bleeding, and enlarged lymph nodes.  Physical Exam: BP 155/80  Pulse 55  Temp(Src) 98 F (36.7 C) (Oral)  Ht 5\' 8"  (1.727 m)  Wt 213 lb 12.8 oz (96.979 kg)  BMI 32.52 kg/m2 General:   Alert,  Well-developed, obese, pleasant and cooperative in NAD Head:  Normocephalic and atraumatic. Eyes:  Sclera clear, no icterus.   Conjunctiva pink. Ears:  Normal auditory acuity. Nose:  No deformity, discharge,  or lesions. Mouth:  No deformity or lesions, dentition normal. Neck:  Supple; no masses or thyromegaly. Lungs:  Clear throughout to auscultation.   No wheezes, crackles, or rhonchi. No acute distress. Heart:  Regular rate and rhythm; no murmurs, clicks, rubs,  or gallops. Abdomen:  Soft, nontender and nondistended. No masses, hepatosplenomegaly or hernias noted. Normal bowel sounds, without guarding, and without rebound.   Rectal:  Deferred until time of colonoscopy.   Msk:  Symmetrical without gross deformities. Normal posture. Pulses:  Normal pulses noted. Extremities:  Without clubbing or edema. Neurologic:  Alert and  oriented x4;  grossly normal neurologically. Skin:  Intact without significant lesions or rashes. Cervical Nodes:  No significant cervical adenopathy. Psych:  Alert and cooperative. Normal mood and affect.

## 2012-08-04 ENCOUNTER — Encounter (HOSPITAL_COMMUNITY): Payer: Self-pay | Admitting: Pharmacy Technician

## 2012-08-04 NOTE — Progress Notes (Signed)
Faxed to PCP

## 2012-08-12 ENCOUNTER — Encounter (HOSPITAL_COMMUNITY): Payer: Self-pay | Admitting: *Deleted

## 2012-08-12 ENCOUNTER — Ambulatory Visit (HOSPITAL_COMMUNITY)
Admission: RE | Admit: 2012-08-12 | Discharge: 2012-08-12 | Disposition: A | Discharge status: Home / Self Care | Payer: BC Managed Care – PPO | Source: Ambulatory Visit | Attending: Gastroenterology | Admitting: Gastroenterology

## 2012-08-12 ENCOUNTER — Encounter (HOSPITAL_COMMUNITY)
Admission: RE | Disposition: A | Discharge status: Home / Self Care | Payer: Self-pay | Source: Ambulatory Visit | Attending: Gastroenterology

## 2012-08-12 DIAGNOSIS — E119 Type 2 diabetes mellitus without complications: Secondary | ICD-10-CM | POA: Insufficient documentation

## 2012-08-12 DIAGNOSIS — K648 Other hemorrhoids: Secondary | ICD-10-CM

## 2012-08-12 DIAGNOSIS — K5730 Diverticulosis of large intestine without perforation or abscess without bleeding: Secondary | ICD-10-CM

## 2012-08-12 DIAGNOSIS — Z01812 Encounter for preprocedural laboratory examination: Secondary | ICD-10-CM | POA: Insufficient documentation

## 2012-08-12 DIAGNOSIS — Z794 Long term (current) use of insulin: Secondary | ICD-10-CM | POA: Insufficient documentation

## 2012-08-12 DIAGNOSIS — Z1211 Encounter for screening for malignant neoplasm of colon: Secondary | ICD-10-CM

## 2012-08-12 DIAGNOSIS — Z800 Family history of malignant neoplasm of digestive organs: Secondary | ICD-10-CM | POA: Insufficient documentation

## 2012-08-12 DIAGNOSIS — I10 Essential (primary) hypertension: Secondary | ICD-10-CM | POA: Insufficient documentation

## 2012-08-12 HISTORY — PX: COLONOSCOPY: SHX5424

## 2012-08-12 LAB — GLUCOSE, CAPILLARY

## 2012-08-12 SURGERY — COLONOSCOPY
Anesthesia: Moderate Sedation

## 2012-08-12 MED ORDER — MEPERIDINE HCL 100 MG/ML IJ SOLN
INTRAMUSCULAR | Status: AC
  Filled 2012-08-12: qty 2

## 2012-08-12 MED ORDER — MIDAZOLAM HCL 5 MG/5ML IJ SOLN
INTRAMUSCULAR | Status: AC
  Filled 2012-08-12: qty 10

## 2012-08-12 MED ORDER — MEPERIDINE HCL 100 MG/ML IJ SOLN
INTRAMUSCULAR | Status: DC | PRN
  Administered 2012-08-12 (×3): 25 mg via INTRAVENOUS

## 2012-08-12 MED ORDER — MIDAZOLAM HCL 5 MG/5ML IJ SOLN
INTRAMUSCULAR | Status: DC | PRN
  Administered 2012-08-12 (×3): 2 mg via INTRAVENOUS

## 2012-08-12 MED ORDER — STERILE WATER FOR IRRIGATION IR SOLN
Status: DC | PRN
  Administered 2012-08-12: 08:00:00

## 2012-08-12 MED ORDER — SODIUM CHLORIDE 0.45 % IV SOLN
INTRAVENOUS | Status: DC
  Administered 2012-08-12: 08:00:00 via INTRAVENOUS

## 2012-08-12 NOTE — H&P (Addendum)
Primary Care Physician:  Colette Ribas, MD Primary Gastroenterologist:  Dr. Darrick Penna  Pre-Procedure History & Physical: HPI:  Francis Hall is a 57 y.o. male here for FAMILY Hx COLON CA-FATHER HAD COLON CA AGE < 60.  Past Medical History  Diagnosis Date  . Hyperplastic colon polyp 07/16/07    30cm   . Diabetes mellitus     insulin  . CAD (coronary artery disease)     [facility]  . Hyperlipemia   . HTN (hypertension)   . Family hx of colon cancer   . Diverticulosis   . Atrial fibrillation     Past Surgical History  Procedure Laterality Date  . Coronary artery bypass graft  06/2004  . Tonsillectomy  1962  . Colonoscopy  07/2007    Dr Lovell Sheehan: hyperplastic polyp 30cm    Prior to Admission medications   Medication Sig Start Date End Date Taking? Authorizing Provider  aspirin 81 MG EC tablet Take 81 mg by mouth daily.     Yes Historical Provider, MD  atorvastatin (LIPITOR) 40 MG tablet Take 20 mg by mouth daily.    Yes Historical Provider, MD  Canagliflozin (INVOKANA) 100 MG TABS Take 1 tablet by mouth daily.    Yes Historical Provider, MD  carvedilol (COREG) 25 MG tablet Take 25 mg by mouth 2 (two) times daily with a meal.     Yes Historical Provider, MD  clopidogrel (PLAVIX) 75 MG tablet Take 75 mg by mouth daily.     Yes Historical Provider, MD  diltiazem (CARDIZEM CD) 180 MG 24 hr capsule Take 180 mg by mouth daily.     Yes Historical Provider, MD  glimepiride (AMARYL) 2 MG tablet Take 2 mg by mouth daily before breakfast.   Yes Historical Provider, MD  hydrochlorothiazide (MICROZIDE) 12.5 MG capsule Take 12.5 mg by mouth daily.   Yes Historical Provider, MD  insulin aspart (NOVOLOG) 100 UNIT/ML injection Inject 10 Units into the skin 3 (three) times daily before meals.   Yes Historical Provider, MD  insulin glargine (LANTUS) 100 UNIT/ML injection Inject 60 Units into the skin at bedtime.    Yes Historical Provider, MD  lisinopril (PRINIVIL,ZESTRIL) 40 MG tablet Take 40 mg  by mouth daily.     Yes Historical Provider, MD  meloxicam (MOBIC) 7.5 MG tablet Take 7.5 mg by mouth daily.   Yes Historical Provider, MD  metFORMIN (GLUCOPHAGE) 1000 MG tablet Take 1,000 mg by mouth 2 (two) times daily with a meal.   Yes Historical Provider, MD  Multiple Vitamin (MULTIVITAMIN) capsule Take 1 capsule by mouth daily.     Yes Historical Provider, MD  Multiple Vitamins-Minerals (MULTIVITAMINS THER. W/MINERALS) TABS Take 1 tablet by mouth daily.   Yes Historical Provider, MD  omega-3 acid ethyl esters (LOVAZA) 1 G capsule Take 2 g by mouth 2 (two) times daily.     Yes Historical Provider, MD  vitamin C (ASCORBIC ACID) 500 MG tablet Take 500 mg by mouth daily.   Yes Historical Provider, MD  furosemide (LASIX) 20 MG tablet Take 20 mg by mouth daily as needed.     Historical Provider, MD    Allergies as of 07/31/2012  . (No Known Allergies)    Family History  Problem Relation Age of Onset  . Colon cancer Father     34's?  . Lung cancer Father   . Arthritis    . Heart disease    . Diabetes      History   Social  History  . Marital Status: Married    Spouse Name: N/A    Number of Children: 2  . Years of Education: 12   Occupational History  . disabled Curator     Social History Main Topics  . Smoking status: Never Smoker   . Smokeless tobacco: Not on file  . Alcohol Use: Yes     Comment: social-2 beers/wk  . Drug Use: No  . Sexually Active: Not on file   Other Topics Concern  . Not on file   Social History Narrative  . No narrative on file    Review of Systems: See HPI, otherwise negative ROS   Physical Exam: BP 127/72  Pulse 68  Temp(Src) 97.8 F (36.6 C) (Oral)  Resp 20  Ht 5\' 8"  (1.727 m)  Wt 213 lb (96.616 kg)  BMI 32.39 kg/m2  SpO2 95% General:   Alert,  pleasant and cooperative in NAD Head:  Normocephalic and atraumatic. Neck:  Supple; Lungs:  Clear throughout to auscultation.    Heart:  Regular rate and rhythm. Abdomen:  Soft,  nontender and nondistended. Normal bowel sounds, without guarding, and without rebound.   Neurologic:  Alert and  oriented x4;  grossly normal neurologically.  Impression/Plan:     FAMILY Hx COLON CA-FATHER HAD COLON CA AGE< 60.  PLAN: 1. TCS TODAY

## 2012-08-12 NOTE — Op Note (Signed)
[facility] [address] [place], [address]   COLONOSCOPY PROCEDURE REPORT  PATIENT: Francis, Hall  MR#: [id number] BIRTHDATE: 1955-08-04 , 56  yrs. old GENDER: Male ENDOSCOPIST: Jonette Eva, MD REFERRED XB:JYNW Phillips Odor, M.D. PROCEDURE DATE:  08/12/2012 PROCEDURE:   Colonoscopy, screening  INDICATIONS:Patient's immediate family history of colon cancer. MEDICATIONS: Demerol 75 mg IV and Versed 6 mg IV  DESCRIPTION OF PROCEDURE:    Physical exam was performed.  Informed consent was obtained from the patient after explaining the benefits, risks, and alternatives to procedure.  The patient was connected to monitor and placed in left lateral position. Continuous oxygen was provided by nasal cannula and IV medicine administered through an indwelling cannula.  After administration of sedation and rectal exam, the patients rectum was intubated and the EC-[id number] ([id number])  colonoscope was advanced under direct visualization to the cecum.  The scope was removed slowly by carefully examining the color, texture, anatomy, and integrity mucosa on the way out.  The patient was recovered in endoscopy and discharged home in satisfactory condition.     COLON FINDINGS: Mild diverticulosis was noted in the sigmoid colon. , The colon was otherwise normal.  There was no inflammation, polyps or cancers unless previously stated.  , and Small internal hemorrhoids were found.  PREP QUALITY: excellent. CECAL W/D TIME: 10 minutes  COMPLICATIONS: None  ENDOSCOPIC IMPRESSION: 1.   Mild diverticulosis was noted in the sigmoid colon 2.   The colon was otherwise normal 3.   Small internal hemorrhoids  RECOMMENDATIONS: AWAIT BIOPSY HIGH FIBER DIET TCS IN 5 YEARS       _______________________________ eSignedJonette Eva, MD 08/12/2012 8:39 AM

## 2012-08-18 ENCOUNTER — Encounter (HOSPITAL_COMMUNITY): Payer: Self-pay | Admitting: Gastroenterology

## 2012-09-22 ENCOUNTER — Encounter: Payer: Self-pay | Admitting: *Deleted

## 2012-11-12 ENCOUNTER — Ambulatory Visit: Payer: BC Managed Care – PPO | Admitting: Cardiovascular Disease

## 2012-11-13 ENCOUNTER — Encounter: Payer: Self-pay | Admitting: Cardiovascular Disease

## 2012-11-13 ENCOUNTER — Ambulatory Visit (INDEPENDENT_AMBULATORY_CARE_PROVIDER_SITE_OTHER): Payer: BC Managed Care – PPO | Admitting: Cardiovascular Disease

## 2012-11-13 VITALS — BP 130/72 | HR 66 | Resp 16 | Ht 68.0 in | Wt 193.1 lb

## 2012-11-13 DIAGNOSIS — E663 Overweight: Secondary | ICD-10-CM

## 2012-11-13 DIAGNOSIS — Z794 Long term (current) use of insulin: Secondary | ICD-10-CM

## 2012-11-13 DIAGNOSIS — E119 Type 2 diabetes mellitus without complications: Secondary | ICD-10-CM

## 2012-11-13 DIAGNOSIS — E785 Hyperlipidemia, unspecified: Secondary | ICD-10-CM

## 2012-11-13 DIAGNOSIS — I2510 Atherosclerotic heart disease of native coronary artery without angina pectoris: Secondary | ICD-10-CM

## 2012-11-13 DIAGNOSIS — I10 Essential (primary) hypertension: Secondary | ICD-10-CM

## 2012-11-13 MED ORDER — DILTIAZEM HCL ER COATED BEADS 180 MG PO CP24: 180.0000 mg | ORAL_CAPSULE | Freq: Every day | ORAL | Status: DC

## 2012-11-13 MED ORDER — OMEGA-3-ACID ETHYL ESTERS 1 G PO CAPS: 2.0000 g | ORAL_CAPSULE | Freq: Two times a day (BID) | ORAL | Status: DC

## 2012-11-13 NOTE — Patient Instructions (Addendum)
Keep up the great job you are doing with exercise and weight loss. Follow up in one year.

## 2012-11-22 NOTE — Progress Notes (Signed)
REVIEWED.  TCS MAR 2014 TICS/IH. NEXT TCS 2019

## 2012-11-24 ENCOUNTER — Encounter: Payer: Self-pay | Admitting: Cardiovascular Disease

## 2012-11-24 DIAGNOSIS — I10 Essential (primary) hypertension: Secondary | ICD-10-CM | POA: Insufficient documentation

## 2012-11-24 DIAGNOSIS — E663 Overweight: Secondary | ICD-10-CM | POA: Insufficient documentation

## 2012-11-24 DIAGNOSIS — E1159 Type 2 diabetes mellitus with other circulatory complications: Secondary | ICD-10-CM | POA: Insufficient documentation

## 2012-11-24 DIAGNOSIS — I2510 Atherosclerotic heart disease of native coronary artery without angina pectoris: Secondary | ICD-10-CM | POA: Insufficient documentation

## 2012-11-24 DIAGNOSIS — E782 Mixed hyperlipidemia: Secondary | ICD-10-CM | POA: Insufficient documentation

## 2012-11-24 NOTE — Assessment & Plan Note (Signed)
The most recent lipid profile that I have is a year-old and at that time his total cholesterol and LDL cholesterol were fair, but his HDL was low and his triglycerides are high. Of course glycemic control has improved since that time. Will call to obtain his more recent lab values

## 2012-11-24 NOTE — Assessment & Plan Note (Signed)
He sounded a little surprised, when I told him that he was still significantly overweight. His BMI now is 29.4 which is just shy of obesity. I told him that an ideal body mass index would be between 21 and 24

## 2012-11-24 NOTE — Assessment & Plan Note (Signed)
He had all-arterial bypass in 2006 and has not had any coronary complaint since that time. A nuclear stress test performed in 2011 evidence of mild inferior wall ischemia. Cardiac catheterization performed at that time showed that both the LIMA to the LAD and the radial artery graft to the up to his marginal artery were widely patent but there was a 90% distal stenosis in the posterior descending artery it was felt to be best managed with medical therapy. The left main coronary artery had a 40% stenosis. Risk Factor modification is the mainstay of his treatment

## 2012-11-24 NOTE — Progress Notes (Signed)
Patient ID: Francis Hall, male   DOB: [birth year], 56 y.o.   MRN: [id number]     Reason for office visit CAD followup  Has been roughly 8 years since Francis Hall underwent bypass surgery (LIMA to LAD and free radial to OM) Francis Hall has not had any coronary event since that time.  Over the last year Francis Hall has taken lifestyle changes to heart and has lost pounds. Francis Hall is physically much more active than before. Invokana has been added to his insulin to oral antidiabetic spirits his most recent hemoglobin A1c was finally close to the target range at 7%. Francis Hall has not required any nitroglycerin for chest pain. Francis Hall has not had anemia health challenges.    Allergies  Allergen Reactions  . Crestor (Rosuvastatin) Other (See Comments)    myalgia    Current Outpatient Prescriptions  Medication Sig Dispense Refill  . aspirin 81 MG EC tablet Take 81 mg by mouth daily.        [name] atorvastatin (LIPITOR) 40 MG tablet Take 20 mg by mouth daily.       . Canagliflozin (INVOKANA) 100 MG TABS Take 1 tablet by mouth daily.       . carvedilol (COREG) 25 MG tablet Take 25 mg by mouth 2 (two) times daily with a meal.        . clopidogrel (PLAVIX) 75 MG tablet Take 75 mg by mouth daily.        [name] diltiazem (CARDIZEM CD) 180 MG 24 hr capsule Take 1 capsule (180 mg total) by mouth daily.  90 capsule  3  . furosemide (LASIX) 20 MG tablet Take 20 mg by mouth daily as needed.       . insulin aspart (NOVOLOG) 100 UNIT/ML injection Inject 10 Units into the skin 3 (three) times daily before meals.      . insulin glargine (LANTUS) 100 UNIT/ML injection Inject 60 Units into the skin at bedtime.       [name] lisinopril (PRINIVIL,ZESTRIL) 40 MG tablet Take 40 mg by mouth 2 (two) times daily.       . meloxicam (MOBIC) 7.5 MG tablet Take 7.5 mg by mouth daily.      . metFORMIN (GLUCOPHAGE) 1000 MG tablet Take 1,000 mg by mouth 2 (two) times daily with a meal.      . Multiple Vitamin (MULTIVITAMIN) capsule Take 1 capsule by mouth daily.        [name]  omega-3 acid ethyl esters (LOVAZA) 1 G capsule Take 2 capsules (2 g total) by mouth 2 (two) times daily.  360 capsule  3  . vitamin C (ASCORBIC ACID) 500 MG tablet Take 500 mg by mouth daily.      . hydrochlorothiazide (MICROZIDE) 12.5 MG capsule Take 12.5 mg by mouth daily.       No current facility-administered medications for this visit.    Past Medical History  Diagnosis Date  . Hyperplastic colon polyp 07/16/07    30cm   . Diabetes mellitus     insulin  . CAD (coronary artery disease)     [facility]  . Hyperlipemia   . HTN (hypertension)   . Family hx of colon cancer   . Diverticulosis   . Atrial fibrillation   . CHF (congestive heart failure) 11/08/2010    2D Echo EF=>55%  . Dyspnea   . OSA (obstructive sleep apnea) 12/21/2007    sleep study perform REM REM and NREM was 95.0%, AHI of 12.31hr and RDI of 12.4hr  Past Surgical History  Procedure Laterality Date  . Coronary artery bypass graft  06/2004    x2 performed for 40% left main stenosis, 80% proximal LAD stenosis of the first diagonal artery, and 50% left circumfles artery priximal stenosis.LIMA to LAD and free radial artery to circumflex coronary artery  . Tonsillectomy  1962  . Colonoscopy  07/2007    Dr Lovell Sheehan: hyperplastic polyp 30cm  . Colonoscopy N/A 08/12/2012    Procedure: COLONOSCOPY;  Surgeon: West Bali, MD;  Location: [facility];  Service: Endoscopy;  Laterality: N/A;  8:30  . Cardiac catheterization  2006 2011    Family History  Problem Relation Age of Onset  . Colon cancer Father     1's?  . Lung cancer Father   . Arthritis    . Heart disease    . Diabetes      History   Social History  . Marital Status: Married    Spouse Name: N/A    Number of Children: 2  . Years of Education: 12   Occupational History  . disabled Curator     Social History Main Topics  . Smoking status: Never Smoker   . Smokeless tobacco: Not on file  . Alcohol Use: Yes     Comment: social-2  beers/wk  . Drug Use: No  . Sexually Active: Not on file   Other Topics Concern  . Not on file   Social History Narrative  . No narrative on file    Review of systems: The patient specifically denies any chest pain at rest or with exertion, dyspnea at rest or with exertion, orthopnea, paroxysmal nocturnal dyspnea, syncope, palpitations, focal neurological deficits, intermittent claudication, lower extremity edema, unexplained weight gain, cough, hemoptysis or wheezing.  The patient also denies abdominal pain, nausea, vomiting, dysphagia, diarrhea, constipation, polyuria, polydipsia, dysuria, hematuria, frequency, urgency, abnormal bleeding or bruising, fever, chills, unexpected weight changes, mood swings, change in skin or hair texture, change in voice quality, auditory or visual problems, allergic reactions or rashes, new musculoskeletal complaints other than usual "aches and pains".   PHYSICAL EXAM BP 130/72  Pulse 66  Resp 16  Ht 5\' 8"  (1.727 m)  Wt 87.59 kg (193 lb 1.6 oz)  BMI 29.37 kg/m2  General: Alert, oriented x3, no distress Head: no evidence of trauma, PERRL, EOMI, no exophtalmos or lid lag, no myxedema, no xanthelasma; normal ears, nose and oropharynx Neck: normal jugular venous pulsations and no hepatojugular reflux; brisk carotid pulses without delay and no carotid bruits Chest: clear to auscultation, no signs of consolidation by percussion or palpation, normal fremitus, symmetrical and full respiratory , sternotomy scar Cardiovascular: normal position and quality of the apical impulse, regular rhythm, normal first and second heart sounds, no murmurs, rubs or gallops Abdomen: no tenderness or distention, no masses by palpation, no abnormal pulsatility or arterial bruits, normal bowel sounds, no hepatosplenomegaly Extremities: no clubbing, cyanosis or edema; 2+ radial, ulnar and brachial pulses bilaterally; 2+ right femoral, posterior tibial and dorsalis pedis pulses; 2+  left femoral, posterior tibial and dorsalis pedis pulses; no subclavian or femoral bruits Neurological: grossly nonfocal   EKG: Sinus rhythm, normal tracing  Lipid Panel  February 2013 total cholesterol 176, HDL 36, LDL 83, triglycerides 285; at that time hemoglobin A1c 7.5% and creatinine 0.86  ASSESSMENT AND PLAN CAD (coronary artery disease) Francis Hall had all-arterial bypass in 2006 and has not had any coronary complaint since that time. A nuclear stress test performed in 2011 evidence of  mild inferior wall ischemia. Cardiac catheterization performed at that time showed that both the LIMA to the LAD and the radial artery graft to the up to his marginal artery were widely patent but there was a 90% distal stenosis in the posterior descending artery it was felt to be best managed with medical therapy. The left main coronary artery had a 40% stenosis. Risk Factor modification is the mainstay of his treatment  Diabetes mellitus with insulin therapy Control has improved. His last hemoglobin A1c was 7%. This can be mostly attributed to the reduction in his weight, but Invokana may have also been of benefit.  Hyperlipidemia The most recent lipid profile that I have is a year-old and at that time his total cholesterol and LDL cholesterol were fair, but his HDL was low and his triglycerides are high. Of course glycemic control has improved since that time. Will call to obtain his more recent lab values  HTN (hypertension) Pressure in the desirable range.  Overweight Francis Hall sounded a little surprised, when I told him that Francis Hall was still significantly overweight. His BMI now is 29.4 which is just shy of obesity. I told him that an ideal body mass index would be between 21 and 24  Orders Placed This Encounter  Procedures  . EKG 12-Lead   Meds ordered this encounter  Medications  . diltiazem (CARDIZEM CD) 180 MG 24 hr capsule    Sig: Take 1 capsule (180 mg total) by mouth daily.    Dispense:  90 capsule     Refill:  3  . omega-3 acid ethyl esters (LOVAZA) 1 G capsule    Sig: Take 2 capsules (2 g total) by mouth 2 (two) times daily.    Dispense:  360 capsule    Refill:  3    Holle Sprick  Thurmon Fair, MD, [facility] [phone] office [phone] pager

## 2012-11-24 NOTE — Assessment & Plan Note (Addendum)
Control has improved. His last hemoglobin A1c was 7%. This can be mostly attributed to the reduction in his weight, but Invokana may have also been of benefit.

## 2012-11-24 NOTE — Assessment & Plan Note (Signed)
Pressure in the desirable range.

## 2012-11-25 ENCOUNTER — Other Ambulatory Visit: Payer: Self-pay | Admitting: *Deleted

## 2012-11-25 MED ORDER — DILTIAZEM HCL ER COATED BEADS 180 MG PO CP24: 180.0000 mg | ORAL_CAPSULE | Freq: Every day | ORAL | Status: DC

## 2012-11-25 NOTE — Telephone Encounter (Signed)
Diltiazem refills electronically

## 2012-11-26 ENCOUNTER — Other Ambulatory Visit: Payer: Self-pay | Admitting: *Deleted

## 2012-12-02 DIAGNOSIS — I219 Acute myocardial infarction, unspecified: Secondary | ICD-10-CM

## 2012-12-02 HISTORY — DX: Acute myocardial infarction, unspecified: I21.9

## 2012-12-10 ENCOUNTER — Telehealth: Payer: Self-pay | Admitting: Cardiovascular Disease

## 2012-12-10 MED ORDER — OMEGA-3-ACID ETHYL ESTERS 1 G PO CAPS: 2.0000 g | ORAL_CAPSULE | Freq: Two times a day (BID) | ORAL | Status: DC

## 2012-12-10 MED ORDER — DILTIAZEM HCL ER COATED BEADS 180 MG PO CP24: 180.0000 mg | ORAL_CAPSULE | Freq: Every day | ORAL | Status: DC

## 2012-12-10 NOTE — Telephone Encounter (Signed)
Call to pt to confirm and does want to use [facility].  Refill(s) sent to pharmacy.  Diltiazem and Omega-3 resent.

## 2012-12-10 NOTE — Telephone Encounter (Signed)
Returned call.  Informed Rx sent to [facility] which was formerly [facility] and he will need a verbal order to process script.  Informed RN will call pt to be sure he wants to use mail order pharmacy and will resend script to [facility] (correct mail order pharm) if needed.  Agreed w/ plan.

## 2012-12-10 NOTE — Telephone Encounter (Signed)
Please call question about his prescription-Reference#[id number]

## 2012-12-28 ENCOUNTER — Inpatient Hospital Stay (HOSPITAL_COMMUNITY)
Admission: EM | Admit: 2012-12-28 | Discharge: 2012-12-30 | DRG: 853 | Disposition: A | Discharge status: Home / Self Care | Payer: BC Managed Care – PPO | Attending: Cardiovascular Disease | Admitting: Cardiovascular Disease

## 2012-12-28 ENCOUNTER — Encounter (HOSPITAL_COMMUNITY): Payer: Self-pay | Admitting: Emergency Medicine

## 2012-12-28 ENCOUNTER — Emergency Department (HOSPITAL_COMMUNITY): Payer: BC Managed Care – PPO

## 2012-12-28 DIAGNOSIS — Z951 Presence of aortocoronary bypass graft: Secondary | ICD-10-CM

## 2012-12-28 DIAGNOSIS — I509 Heart failure, unspecified: Secondary | ICD-10-CM | POA: Diagnosis present

## 2012-12-28 DIAGNOSIS — I10 Essential (primary) hypertension: Secondary | ICD-10-CM

## 2012-12-28 DIAGNOSIS — Z800 Family history of malignant neoplasm of digestive organs: Secondary | ICD-10-CM

## 2012-12-28 DIAGNOSIS — Z860100 Personal history of colon polyps, unspecified: Secondary | ICD-10-CM

## 2012-12-28 DIAGNOSIS — Z8249 Family history of ischemic heart disease and other diseases of the circulatory system: Secondary | ICD-10-CM

## 2012-12-28 DIAGNOSIS — K5730 Diverticulosis of large intestine without perforation or abscess without bleeding: Secondary | ICD-10-CM | POA: Diagnosis present

## 2012-12-28 DIAGNOSIS — Z7902 Long term (current) use of antithrombotics/antiplatelets: Secondary | ICD-10-CM

## 2012-12-28 DIAGNOSIS — E119 Type 2 diabetes mellitus without complications: Secondary | ICD-10-CM

## 2012-12-28 DIAGNOSIS — I2510 Atherosclerotic heart disease of native coronary artery without angina pectoris: Secondary | ICD-10-CM | POA: Diagnosis present

## 2012-12-28 DIAGNOSIS — E663 Overweight: Secondary | ICD-10-CM | POA: Diagnosis present

## 2012-12-28 DIAGNOSIS — G4733 Obstructive sleep apnea (adult) (pediatric): Secondary | ICD-10-CM | POA: Diagnosis present

## 2012-12-28 DIAGNOSIS — Z7982 Long term (current) use of aspirin: Secondary | ICD-10-CM

## 2012-12-28 DIAGNOSIS — I214 Non-ST elevation (NSTEMI) myocardial infarction: Principal | ICD-10-CM | POA: Diagnosis present

## 2012-12-28 DIAGNOSIS — E785 Hyperlipidemia, unspecified: Secondary | ICD-10-CM

## 2012-12-28 DIAGNOSIS — Z794 Long term (current) use of insulin: Secondary | ICD-10-CM

## 2012-12-28 DIAGNOSIS — E782 Mixed hyperlipidemia: Secondary | ICD-10-CM | POA: Diagnosis present

## 2012-12-28 DIAGNOSIS — E1159 Type 2 diabetes mellitus with other circulatory complications: Secondary | ICD-10-CM

## 2012-12-28 DIAGNOSIS — Z79899 Other long term (current) drug therapy: Secondary | ICD-10-CM

## 2012-12-28 LAB — PRO B NATRIURETIC PEPTIDE: Pro B Natriuretic peptide (BNP): 662 pg/mL — ABNORMAL HIGH (ref 0–125)

## 2012-12-28 LAB — COMPREHENSIVE METABOLIC PANEL
BUN: 16 mg/dL (ref 6–23)
CO2: 23 mEq/L (ref 19–32)
Chloride: 102 mEq/L (ref 96–112)
Creatinine, Ser: 0.81 mg/dL (ref 0.50–1.35)
GFR calc Af Amer: >90 mL/min (ref >90)
GFR calc non Af Amer: >90 mL/min (ref >90)
Glucose, Bld: 284 mg/dL — ABNORMAL HIGH (ref 70–99)
Total Bilirubin: 0.3 mg/dL (ref 0.3–1.2)

## 2012-12-28 LAB — CBC WITH DIFFERENTIAL/PLATELET
HCT: 37.3 % — ABNORMAL LOW (ref 39.0–52.0)
Hemoglobin: 13.2 g/dL (ref 13.0–17.0)
Lymphocytes Relative: 35 % (ref 12–46)
MCV: 88.8 fL (ref 78.0–100.0)
Monocytes Absolute: 0.3 10*3/uL (ref 0.1–1.0)
Monocytes Relative: 7 % (ref 3–12)
Neutro Abs: 2.7 10*3/uL (ref 1.7–7.7)
WBC: 4.9 10*3/uL (ref 4.0–10.5)

## 2012-12-28 LAB — PROTIME-INR
INR: 0.95 (ref 0.00–1.49)
Prothrombin Time: 12.5 seconds (ref 11.6–15.2)

## 2012-12-28 LAB — TROPONIN I: Troponin I: 1.14 ng/mL (ref <0.30)

## 2012-12-28 LAB — GLUCOSE, CAPILLARY: Glucose-Capillary: 199 mg/dL — ABNORMAL HIGH (ref 70–99)

## 2012-12-28 LAB — PLATELET INHIBITION P2Y12: Platelet Function  P2Y12: 258 [PRU] (ref 194–418)

## 2012-12-28 LAB — HEPARIN LEVEL (UNFRACTIONATED): Heparin Unfractionated: 0.39 IU/mL (ref 0.30–0.70)

## 2012-12-28 MED ORDER — SODIUM CHLORIDE 0.9 % IV SOLN: INTRAVENOUS | Status: DC

## 2012-12-28 MED ORDER — ASPIRIN 81 MG PO CHEW
324.0000 mg | CHEWABLE_TABLET | Freq: Once | ORAL | Status: AC
  Administered 2012-12-28: 324 mg via ORAL

## 2012-12-28 MED ORDER — MORPHINE SULFATE 2 MG/ML IJ SOLN
2.0000 mg | Freq: Once | INTRAMUSCULAR | Status: AC
  Administered 2012-12-28: 2 mg via INTRAVENOUS
  Filled 2012-12-28: qty 1

## 2012-12-28 MED ORDER — INSULIN GLARGINE 100 UNIT/ML ~~LOC~~ SOLN
60.0000 [IU] | Freq: Every day | SUBCUTANEOUS | Status: DC
  Administered 2012-12-28 – 2012-12-29 (×2): 60 [IU] via SUBCUTANEOUS
  Filled 2012-12-28 (×4): qty 0.6

## 2012-12-28 MED ORDER — NITROGLYCERIN IN D5W 200-5 MCG/ML-% IV SOLN: 3.0000 ug/min | INTRAVENOUS | Status: DC

## 2012-12-28 MED ORDER — SODIUM CHLORIDE 0.9 % IJ SOLN: 3.0000 mL | INTRAMUSCULAR | Status: DC | PRN

## 2012-12-28 MED ORDER — ASPIRIN 81 MG PO CHEW
CHEWABLE_TABLET | ORAL | Status: AC
  Filled 2012-12-28: qty 4

## 2012-12-28 MED ORDER — ASPIRIN 81 MG PO CHEW
324.0000 mg | CHEWABLE_TABLET | ORAL | Status: AC
  Administered 2012-12-29: 324 mg via ORAL
  Filled 2012-12-28: qty 4

## 2012-12-28 MED ORDER — SODIUM CHLORIDE 0.9 % IV SOLN: 250.0000 mL | INTRAVENOUS | Status: DC | PRN

## 2012-12-28 MED ORDER — CHLORHEXIDINE GLUCONATE CLOTH 2 % EX PADS
6.0000 | MEDICATED_PAD | Freq: Every day | CUTANEOUS | Status: DC
  Administered 2012-12-29 – 2012-12-30 (×2): 6 via TOPICAL

## 2012-12-28 MED ORDER — NITROGLYCERIN 0.4 MG SL SUBL
0.4000 mg | SUBLINGUAL_TABLET | SUBLINGUAL | Status: DC | PRN
  Administered 2012-12-28 (×3): 0.4 mg via SUBLINGUAL
  Filled 2012-12-28: qty 25

## 2012-12-28 MED ORDER — DILTIAZEM HCL ER COATED BEADS 180 MG PO CP24
180.0000 mg | ORAL_CAPSULE | Freq: Every day | ORAL | Status: DC
  Administered 2012-12-28 – 2012-12-29 (×2): 180 mg via ORAL
  Filled 2012-12-28 (×2): qty 1

## 2012-12-28 MED ORDER — HEPARIN (PORCINE) IN NACL 100-0.45 UNIT/ML-% IJ SOLN
1300.0000 [IU]/h | INTRAMUSCULAR | Status: DC
  Administered 2012-12-28: 1150 [IU]/h via INTRAVENOUS
  Filled 2012-12-28 (×3): qty 250

## 2012-12-28 MED ORDER — MUPIROCIN 2 % EX OINT
1.0000 "application " | TOPICAL_OINTMENT | Freq: Two times a day (BID) | CUTANEOUS | Status: DC
  Administered 2012-12-28 – 2012-12-30 (×5): 1 via NASAL
  Filled 2012-12-28: qty 22

## 2012-12-28 MED ORDER — SODIUM CHLORIDE 0.9 % IJ SOLN
3.0000 mL | Freq: Two times a day (BID) | INTRAMUSCULAR | Status: DC
  Administered 2012-12-29: 3 mL via INTRAVENOUS

## 2012-12-28 MED ORDER — INSULIN ASPART 100 UNIT/ML ~~LOC~~ SOLN: 10.0000 [IU] | Freq: Three times a day (TID) | SUBCUTANEOUS | Status: DC

## 2012-12-28 MED ORDER — ONDANSETRON HCL 4 MG/2ML IJ SOLN: 4.0000 mg | Freq: Four times a day (QID) | INTRAMUSCULAR | Status: DC | PRN

## 2012-12-28 MED ORDER — ASPIRIN 81 MG PO CHEW: 324.0000 mg | CHEWABLE_TABLET | ORAL | Status: AC

## 2012-12-28 MED ORDER — CARVEDILOL 25 MG PO TABS
25.0000 mg | ORAL_TABLET | Freq: Two times a day (BID) | ORAL | Status: DC
  Administered 2012-12-28 – 2012-12-30 (×4): 25 mg via ORAL
  Filled 2012-12-28 (×7): qty 1

## 2012-12-28 MED ORDER — NITROGLYCERIN 2 % TD OINT
1.0000 [in_us] | TOPICAL_OINTMENT | Freq: Once | TRANSDERMAL | Status: AC
  Administered 2012-12-28: 1 [in_us] via TOPICAL

## 2012-12-28 MED ORDER — INSULIN ASPART 100 UNIT/ML ~~LOC~~ SOLN
0.0000 [IU] | Freq: Three times a day (TID) | SUBCUTANEOUS | Status: DC
  Administered 2012-12-28: 11 [IU] via SUBCUTANEOUS
  Administered 2012-12-28 – 2012-12-29 (×2): 10 [IU] via SUBCUTANEOUS
  Administered 2012-12-30: 10:00:00 11 [IU] via SUBCUTANEOUS

## 2012-12-28 MED ORDER — SODIUM CHLORIDE 0.9 % IV SOLN
INTRAVENOUS | Status: DC
  Administered 2012-12-29: 04:00:00 via INTRAVENOUS

## 2012-12-28 MED ORDER — ASPIRIN 300 MG RE SUPP: 300.0000 mg | RECTAL | Status: AC

## 2012-12-28 MED ORDER — NITROGLYCERIN 2 % TD OINT
TOPICAL_OINTMENT | TRANSDERMAL | Status: AC
  Filled 2012-12-28: qty 1

## 2012-12-28 MED ORDER — ENOXAPARIN SODIUM 100 MG/ML ~~LOC~~ SOLN
1.0000 mg/kg | Freq: Once | SUBCUTANEOUS | Status: AC
  Administered 2012-12-28: 100 mg via SUBCUTANEOUS
  Filled 2012-12-28: qty 1

## 2012-12-28 MED ORDER — NITROGLYCERIN IN D5W 200-5 MCG/ML-% IV SOLN
10.0000 ug/min | INTRAVENOUS | Status: DC
  Administered 2012-12-28: 10 ug/min via INTRAVENOUS
  Filled 2012-12-28: qty 250

## 2012-12-28 MED ORDER — ASPIRIN EC 81 MG PO TBEC
81.0000 mg | DELAYED_RELEASE_TABLET | Freq: Every day | ORAL | Status: DC
  Administered 2012-12-30: 09:00:00 81 mg via ORAL
  Filled 2012-12-28: qty 1

## 2012-12-28 MED ORDER — LISINOPRIL 40 MG PO TABS
40.0000 mg | ORAL_TABLET | Freq: Two times a day (BID) | ORAL | Status: DC
  Administered 2012-12-28 – 2012-12-30 (×4): 40 mg via ORAL
  Filled 2012-12-28 (×7): qty 1

## 2012-12-28 MED ORDER — ATORVASTATIN CALCIUM 20 MG PO TABS
20.0000 mg | ORAL_TABLET | Freq: Every day | ORAL | Status: DC
  Administered 2012-12-28 – 2012-12-29 (×2): 20 mg via ORAL
  Filled 2012-12-28 (×4): qty 1

## 2012-12-28 MED ORDER — ACETAMINOPHEN 325 MG PO TABS
650.0000 mg | ORAL_TABLET | ORAL | Status: DC | PRN
  Administered 2012-12-28 – 2012-12-29 (×2): 650 mg via ORAL
  Filled 2012-12-28 (×2): qty 2

## 2012-12-28 MED ORDER — GI COCKTAIL ~~LOC~~
30.0000 mL | Freq: Once | ORAL | Status: AC
  Administered 2012-12-28: 30 mL via ORAL
  Filled 2012-12-28: qty 30

## 2012-12-28 MED ORDER — OMEGA-3-ACID ETHYL ESTERS 1 G PO CAPS
2.0000 g | ORAL_CAPSULE | Freq: Two times a day (BID) | ORAL | Status: DC
  Administered 2012-12-28 – 2012-12-30 (×4): 2 g via ORAL
  Filled 2012-12-28 (×7): qty 2

## 2012-12-28 NOTE — ED Notes (Signed)
Pt states he got bee stung ( yellow jacket) mowing yesterday, Got short of breath after the sting, and has continued to have shortness of breath

## 2012-12-28 NOTE — ED Notes (Signed)
Report given to [place], RN on 2900

## 2012-12-28 NOTE — ED Notes (Signed)
Pt states the tightness in his chest has resolved. Pain 0/10

## 2012-12-28 NOTE — ED Notes (Signed)
Pt states he feels a tightness in his chest, not pain, uncomfortable , enough that he can not sleep

## 2012-12-28 NOTE — Progress Notes (Signed)
ANTICOAGULATION CONSULT NOTE - Initial Consult  Pharmacy Consult for Heparin Indication: CP/NSTEMI  Allergies  Allergen Reactions  . Crestor (Rosuvastatin) Other (See Comments)    myalgia    Patient Measurements: Height: 5\' 8"  (172.7 cm) Weight: 195 lb 8.8 oz (88.7 kg) IBW/kg (Calculated) : 68.4 Heparin Dosing Weight: 86.5kg  Vital Signs: Temp: 98.7 F (37.1 C) (07/27 0950) Temp src: Oral (07/27 0950) BP: 120/62 mmHg (07/27 1100) Pulse Rate: 61 (07/27 1100)  Labs:  Recent Labs  12/28/12 0514  HGB 13.2  HCT 37.3*  PLT 224  CREATININE 0.81  TROPONINI 1.14*    Estimated Creatinine Clearance: 111.5 ml/min (by C-G formula based on Cr of 0.81).   Medical History: Past Medical History  Diagnosis Date  . Hyperplastic colon polyp 07/16/07    30cm   . Diabetes mellitus     insulin  . CAD (coronary artery disease)     [facility]  . Hyperlipemia   . HTN (hypertension)   . Family hx of colon cancer   . Diverticulosis   . Atrial fibrillation   . CHF (congestive heart failure) 11/08/2010    2D Echo EF=>55%  . Dyspnea   . OSA (obstructive sleep apnea) 12/21/2007    sleep study perform REM REM and NREM was 95.0%, AHI of 12.31hr and RDI of 12.4hr    Medications:  Prescriptions prior to admission  Medication Sig Dispense Refill  . aspirin 81 MG EC tablet Take 81 mg by mouth daily.        [name] atorvastatin (LIPITOR) 40 MG tablet Take 20 mg by mouth daily.       . Canagliflozin (INVOKANA) 100 MG TABS Take 1 tablet by mouth daily.       . carvedilol (COREG) 25 MG tablet Take 25 mg by mouth 2 (two) times daily with a meal.        . Cholecalciferol (VITAMIN D3) 1000 UNITS CAPS Take 2 capsules by mouth daily.      . clopidogrel (PLAVIX) 75 MG tablet Take 75 mg by mouth daily.        [name] diltiazem (CARDIZEM CD) 180 MG 24 hr capsule Take 1 capsule (180 mg total) by mouth daily.  90 capsule  3  . furosemide (LASIX) 20 MG tablet Take 20 mg by mouth daily as needed.        . insulin aspart (NOVOLOG) 100 UNIT/ML injection Inject 10 Units into the skin 3 (three) times daily before meals.      . insulin glargine (LANTUS) 100 UNIT/ML injection Inject 60 Units into the skin at bedtime.       [name] lisinopril (PRINIVIL,ZESTRIL) 40 MG tablet Take 40 mg by mouth 2 (two) times daily.       . meloxicam (MOBIC) 7.5 MG tablet Take 7.5 mg by mouth daily.      . metFORMIN (GLUCOPHAGE) 1000 MG tablet Take 1,000 mg by mouth 2 (two) times daily with a meal.      . Multiple Vitamin (MULTIVITAMIN) capsule Take 1 capsule by mouth daily.        [name] omega-3 acid ethyl esters (LOVAZA) 1 G capsule Take 2 capsules (2 g total) by mouth 2 (two) times daily.  360 capsule  3  . vitamin C (ASCORBIC ACID) 500 MG tablet Take 500 mg by mouth daily.      . hydrochlorothiazide (MICROZIDE) 12.5 MG capsule Take 12.5 mg by mouth daily.        Assessment: 56yom transferred from [facility]  to start heparin for CP/NSTEMI. Patient received Lovenox 100mg  this AM ~0620 - will not bolus and 1150delay heparin drip start until ~1600. Patient reports no bleeding and not taking any anticoagulants. - H/H and Plts wnl - Baseline INR ordered  Goal of Therapy:  Heparin level 0.3-0.7 units/ml Monitor platelets by anticoagulation protocol: Yes   Plan:  1. INR now 2. Heparin drip 1150 units/hr (11.5 ml/hr) @ 1600 tonight 3. Check heparin level tonight @2300  4. Daily heparin level and CBC  Cleon Dew [phone] 12/28/2012,12:14 PM

## 2012-12-28 NOTE — ED Notes (Signed)
Report given to [facility], pt updated, remains pain free

## 2012-12-28 NOTE — ED Provider Notes (Signed)
CSN: [id number]     Arrival date & time 12/28/12  0434 History     First MD Initiated Contact with Patient 12/28/12 0507     Chief Complaint  Patient presents with  . Shortness of Breath   (Consider location/radiation/quality/duration/timing/severity/associated sxs/prior Treatment) HPI Comments: Patient with history of CAD, status post CABG 6 years ago.  Presents with complaints of feeling short of breath since yesterday afternoon.  It seemed to be worse this evening when he was lying down and trying to sleep.  He reports feeling some "indigestion" but denies any chest pain.  No fevers, chills, or cough.  He denies any recent exertional symptoms.  This all seems to have started after being stung by a yellow jacket while mowing the grass.  Patient is a 57 y.o. male presenting with shortness of breath. The history is provided by the patient.  Shortness of Breath Severity:  Mild Onset quality:  Gradual Duration:  2 days Timing:  Constant Progression:  Worsening Chronicity:  New Context: activity   Relieved by:  Nothing Worsened by:  Activity Ineffective treatments:  None tried   Past Medical History  Diagnosis Date  . Hyperplastic colon polyp 07/16/07    30cm   . Diabetes mellitus     insulin  . CAD (coronary artery disease)     [facility]  . Hyperlipemia   . HTN (hypertension)   . Family hx of colon cancer   . Diverticulosis   . Atrial fibrillation   . CHF (congestive heart failure) 11/08/2010    2D Echo EF=>55%  . Dyspnea   . OSA (obstructive sleep apnea) 12/21/2007    sleep study perform REM REM and NREM was 95.0%, AHI of 12.31hr and RDI of 12.4hr   Past Surgical History  Procedure Laterality Date  . Coronary artery bypass graft  06/2004    x2 performed for 40% left main stenosis, 80% proximal LAD stenosis of the first diagonal artery, and 50% left circumfles artery priximal stenosis.LIMA to LAD and free radial artery to circumflex coronary artery  .  Tonsillectomy  1962  . Colonoscopy  07/2007    Dr Lovell Sheehan: hyperplastic polyp 30cm  . Colonoscopy N/A 08/12/2012    Procedure: COLONOSCOPY;  Surgeon: West Bali, MD;  [facility]: [facility];  Service: Endoscopy;  Laterality: N/A;  8:30  . Cardiac catheterization  2006 2011   Family History  Problem Relation Age of Onset  . Colon cancer Father     80's?  . Lung cancer Father   . Arthritis    . Heart disease    . Diabetes     History  Substance Use Topics  . Smoking status: Never Smoker   . Smokeless tobacco: Not on file  . Alcohol Use: Yes     Comment: social-2 beers/wk    Review of Systems  Respiratory: Positive for shortness of breath.   All other systems reviewed and are negative.    Allergies  Crestor  Home Medications   Current Outpatient Rx  Name  Route  Sig  Dispense  Refill  . aspirin 81 MG EC tablet   Oral   Take 81 mg by mouth daily.           [name] atorvastatin (LIPITOR) 40 MG tablet   Oral   Take 20 mg by mouth daily.          . Canagliflozin (INVOKANA) 100 MG TABS   Oral   Take 1 tablet by mouth daily.          [name]  carvedilol (COREG) 25 MG tablet   Oral   Take 25 mg by mouth 2 (two) times daily with a meal.           . clopidogrel (PLAVIX) 75 MG tablet   Oral   Take 75 mg by mouth daily.           [name] diltiazem (CARDIZEM CD) 180 MG 24 hr capsule   Oral   Take 1 capsule (180 mg total) by mouth daily.   90 capsule   3   . furosemide (LASIX) 20 MG tablet   Oral   Take 20 mg by mouth daily as needed.          . insulin aspart (NOVOLOG) 100 UNIT/ML injection   Subcutaneous   Inject 10 Units into the skin 3 (three) times daily before meals.         . insulin glargine (LANTUS) 100 UNIT/ML injection   Subcutaneous   Inject 60 Units into the skin at bedtime.          [name] lisinopril (PRINIVIL,ZESTRIL) 40 MG tablet   Oral   Take 40 mg by mouth 2 (two) times daily.          . meloxicam (MOBIC) 7.5 MG tablet   Oral   Take 7.5  mg by mouth daily.         . metFORMIN (GLUCOPHAGE) 1000 MG tablet   Oral   Take 1,000 mg by mouth 2 (two) times daily with a meal.         . Multiple Vitamin (MULTIVITAMIN) capsule   Oral   Take 1 capsule by mouth daily.           [name] omega-3 acid ethyl esters (LOVAZA) 1 G capsule   Oral   Take 2 capsules (2 g total) by mouth 2 (two) times daily.   360 capsule   3   . vitamin C (ASCORBIC ACID) 500 MG tablet   Oral   Take 500 mg by mouth daily.         . hydrochlorothiazide (MICROZIDE) 12.5 MG capsule   Oral   Take 12.5 mg by mouth daily.          BP 171/77  Temp(Src) 97.7 F (36.5 C) (Oral)  Resp 20  Ht 5\' 8"  (1.727 m)  Wt 190 lb (86.183 kg)  BMI 28.9 kg/m2  SpO2 95% Physical Exam  Nursing note and vitals reviewed. Constitutional: He is oriented to person, place, and time. He appears well-developed and well-nourished. No distress.  HENT:  Head: Normocephalic and atraumatic.  Mouth/Throat: Oropharynx is clear and moist.  Neck: Normal range of motion. Neck supple.  Cardiovascular: Normal rate, regular rhythm and normal heart sounds.   No murmur heard. Pulmonary/Chest: Effort normal and breath sounds normal. No respiratory distress. He has no wheezes. He has no rales.  Abdominal: Soft. Bowel sounds are normal. He exhibits no distension. There is no tenderness.  Lymphadenopathy:    He has no cervical adenopathy.  Neurological: He is alert and oriented to person, place, and time.  Skin: Skin is warm and dry. He is not diaphoretic.  There is a small puncture to the posterior aspect of the left ankle.  No redness or erythema.    ED Course   Procedures (including critical care time)  Labs Reviewed  CBC WITH DIFFERENTIAL  COMPREHENSIVE METABOLIC PANEL  TROPONIN I  PRO B NATRIURETIC PEPTIDE  LIPASE, BLOOD   No results found. No diagnosis found.  Date: 12/28/2012  Rate: 70  Rhythm: normal sinus rhythm  QRS Axis: normal  Intervals: normal  ST/T Wave  abnormalities: normal  Conduction Disutrbances:none  Narrative Interpretation:   Old EKG Reviewed: unchanged    MDM  The workup reveals a negative ekg, but troponin is elevated at 1.12 with normal renal function.  I have spoken with Dr. Thomes Cake, the cardiology fellow at [facility] who agrees to accept the patient in transfer.  He will be given lovenox and started on a nitroglycerin drip in consultation with cardiology.    CRITICAL CARE Performed by: Geoffery Lyons Total critical care time: 30 minutes Critical care time was exclusive of separately billable procedures and treating other patients. Critical care was necessary to treat or prevent imminent or life-threatening deterioration. Critical care was time spent personally by me on the following activities: development of treatment plan with patient and/or surrogate as well as nursing, discussions with consultants, evaluation of patient's response to treatment, examination of patient, obtaining history from patient or surrogate, ordering and performing treatments and interventions, ordering and review of laboratory studies, ordering and review of radiographic studies, pulse oximetry and re-evaluation of patient's condition.   Geoffery Lyons, MD 12/28/12 [phone]

## 2012-12-28 NOTE — ED Notes (Signed)
[facility] called for Transfer

## 2012-12-28 NOTE — H&P (Signed)
Pt. Seen and examined. Agree with the NP/PA-C note as written.  Chest pressure last Friday while at a race in [place], on and off associated with dyspnea. Improved somewhat Saturday, but then much worse last night, could not sleep - presented to the ER at [facility] with elevated troponin of 1.14 ->1.20. He tells me that in the past he had an abnormal NST in 2011 and a cath, but his vein grafts were considered to small to intervene upon. I suspect he may have finally occluded a graft.  He is currently chest pain free on heparin/nitroglycerin. Keep NPO p MN for LHC tomorrow.   Chrystie Nose, MD, [facility] Attending Cardiologist [facility]

## 2012-12-28 NOTE — ED Notes (Signed)
Nitro paste removed, MD ordered drip

## 2012-12-28 NOTE — Progress Notes (Signed)
Utilization Review Completed.Francis Hall T7/27/2014  

## 2012-12-28 NOTE — ED Notes (Signed)
Patient placed on continuous cardiac monitoring, continuous pulse 0x monitoring, and placed patient of 2L O2 [address].  

## 2012-12-28 NOTE — ED Notes (Signed)
[facility] here to transport pt.   

## 2012-12-28 NOTE — Progress Notes (Signed)
Pt c/o mid chest pressure 6/10. VSS. Total of 3 sublingual nitro given. Nitro drip titrated up to 40 mcg. Now pt states pressure is 2/10.  EKG done, no changes noted compared to earlier EKG. Dr. Tresa Endo notified. Morphine order obtained. Will continue to monitor.

## 2012-12-28 NOTE — ED Notes (Signed)
CRITICAL VALUE ALERT  Critical value received:  Troponin 1.14  Date of notification:  12/28/12  Time of notification:  0602  Critical value read back:yes  Nurse who received alert:  Kathlene Cote, RN  MD notified (1st page):  Dr. Judd Lien  Time of first page:  0603  Responding MD:  Dr. Judd Lien  Time MD responded:  [phone]

## 2012-12-28 NOTE — ED Notes (Signed)
Received report on pt, pt alert sitting in bed, denies any pain at present, NSR on monitor, pt and family updated on plan of care,

## 2012-12-28 NOTE — ED Notes (Signed)
Patient reports is having no chest pain, tightness, or discomfort at this time. Patient reports chest pain lessened soon after nitroglycerin ointment was placed, and states pain and discomfort is gone now.

## 2012-12-28 NOTE — H&P (Signed)
Chief Complaint:  SOB, NSTEMI  HPI: The patient is a 57 y/o male, followed at [facility] by Dr. Royann Shivers. He was transferred directly from [facility] for further management of NSTEMI. He has known CAD, s/p all-arterial CABG in 2006 (LIMA to LAD and free radial to OM).  A nuclear stress test performed in 2011 demonstrated evidence of mild inferior wall ischemia. Cardiac catheterization performed at that time showed that both the LIMA to the LAD and the radial artery graft up to his marginal artery were widely patent but there was a 90% distal stenosis in the posterior descending artery, which was felt to be best managed with medical therapy. The left main coronary artery had a 40% stenosis. His other cardiac risk factors include DM, HTN and a family history of early CAD. He initially presented to [facility] with  a complaint of "indigestion" and shortness of breath. He had an intiall episode 3 days ago that was short lived and went away on its own. However, last night he developed midsternal chest tightness with mild SOB. It was worse with exertion. It did not radiate. He did not take anything at home for the discomfort. The pain did not improved with rest. At that time, he decided to go to the ER for evaluation. Work-up demonstrated a normal EKG, however he had a positive troponin of 1.14. He was started on IV nitro and transferred to [facility] for further management. He is currently CP free and denies SOB.   Past Medical History  Diagnosis Date  . Hyperplastic colon polyp 07/16/07    30cm   . Diabetes mellitus     insulin  . CAD (coronary artery disease)     [facility]  . Hyperlipemia   . HTN (hypertension)   . Family hx of colon cancer   . Diverticulosis   . Atrial fibrillation   . CHF (congestive heart failure) 11/08/2010    2D Echo EF=>55%  . Dyspnea   . OSA (obstructive sleep apnea) 12/21/2007    sleep study perform REM REM and NREM was 95.0%, AHI of 12.31hr and RDI of 12.4hr     Past Surgical History  Procedure Laterality Date  . Coronary artery bypass graft  06/2004    x2 performed for 40% left main stenosis, 80% proximal LAD stenosis of the first diagonal artery, and 50% left circumfles artery priximal stenosis.LIMA to LAD and free radial artery to circumflex coronary artery  . Tonsillectomy  1962  . Colonoscopy  07/2007    Dr Lovell Sheehan: hyperplastic polyp 30cm  . Colonoscopy N/A 08/12/2012    Procedure: COLONOSCOPY;  Surgeon: West Bali, MD;  Location: [facility];  Service: Endoscopy;  Laterality: N/A;  8:30  . Cardiac catheterization  2006 2011    Family History  Problem Relation Age of Onset  . Colon cancer Father     43's?  . Lung cancer Father   . Arthritis    . Heart disease    . Diabetes    . Coronary artery disease Father 38    MI   Social History:  reports that he has never smoked. He does not have any smokeless tobacco history on file. He reports that  drinks alcohol. He reports that he does not use illicit drugs.  Allergies:  Allergies  Allergen Reactions  . Crestor (Rosuvastatin) Other (See Comments)    myalgia    Medications Prior to Admission  Medication Sig Dispense Refill  . aspirin 81 MG EC tablet  Take 81 mg by mouth daily.        [name] atorvastatin (LIPITOR) 40 MG tablet Take 20 mg by mouth daily.       . Canagliflozin (INVOKANA) 100 MG TABS Take 1 tablet by mouth daily.       . carvedilol (COREG) 25 MG tablet Take 25 mg by mouth 2 (two) times daily with a meal.        . Cholecalciferol (VITAMIN D3) 1000 UNITS CAPS Take 2 capsules by mouth daily.      . clopidogrel (PLAVIX) 75 MG tablet Take 75 mg by mouth daily.        [name] diltiazem (CARDIZEM CD) 180 MG 24 hr capsule Take 1 capsule (180 mg total) by mouth daily.  90 capsule  3  . furosemide (LASIX) 20 MG tablet Take 20 mg by mouth daily as needed.       . insulin aspart (NOVOLOG) 100 UNIT/ML injection Inject 10 Units into the skin 3 (three) times daily before meals.      .  insulin glargine (LANTUS) 100 UNIT/ML injection Inject 60 Units into the skin at bedtime.       [name] lisinopril (PRINIVIL,ZESTRIL) 40 MG tablet Take 40 mg by mouth 2 (two) times daily.       . meloxicam (MOBIC) 7.5 MG tablet Take 7.5 mg by mouth daily.      . metFORMIN (GLUCOPHAGE) 1000 MG tablet Take 1,000 mg by mouth 2 (two) times daily with a meal.      . Multiple Vitamin (MULTIVITAMIN) capsule Take 1 capsule by mouth daily.        [name] omega-3 acid ethyl esters (LOVAZA) 1 G capsule Take 2 capsules (2 g total) by mouth 2 (two) times daily.  360 capsule  3  . vitamin C (ASCORBIC ACID) 500 MG tablet Take 500 mg by mouth daily.      . hydrochlorothiazide (MICROZIDE) 12.5 MG capsule Take 12.5 mg by mouth daily.        Results for orders placed during the hospital encounter of 12/28/12 (from the past 48 hour(s))  CBC WITH DIFFERENTIAL     Status: Abnormal   Collection Time    12/28/12  5:14 AM      Result Value Range   WBC 4.9  4.0 - 10.5 K/uL   RBC 4.20 (*) 4.22 - 5.81 MIL/uL   Hemoglobin 13.2  13.0 - 17.0 g/dL   HCT 16.1 (*) 09.6 - 04.5 %   MCV 88.8  78.0 - 100.0 fL   MCH 31.4  26.0 - 34.0 pg   MCHC 35.4  30.0 - 36.0 g/dL   RDW 40.9  81.1 - 91.4 %   Platelets 224  150 - 400 K/uL   Neutrophils Relative % 53  43 - 77 %   Neutro Abs 2.7  1.7 - 7.7 K/uL   Lymphocytes Relative 35  12 - 46 %   Lymphs Abs 1.7  0.7 - 4.0 K/uL   Monocytes Relative 7  3 - 12 %   Monocytes Absolute 0.3  0.1 - 1.0 K/uL   Eosinophils Relative 4  0 - 5 %   Eosinophils Absolute 0.2  0.0 - 0.7 K/uL   Basophils Relative 1  0 - 1 %   Basophils Absolute 0.0  0.0 - 0.1 K/uL  COMPREHENSIVE METABOLIC PANEL     Status: Abnormal   Collection Time    12/28/12  5:14 AM      Result Value Range  Sodium 137  135 - 145 mEq/L   Potassium 4.0  3.5 - 5.1 mEq/L   Comment: RESULT REPEATED AND VERIFIED   Chloride 102  96 - 112 mEq/L   CO2 23  19 - 32 mEq/L   Glucose, Bld 284 (*) 70 - 99 mg/dL   BUN 16  6 - 23 mg/dL   Creatinine,  Ser 1.61  0.50 - 1.35 mg/dL   Calcium 9.0  8.4 - 09.6 mg/dL   Total Protein 6.6  6.0 - 8.3 g/dL   Albumin 3.7  3.5 - 5.2 g/dL   AST 34  0 - 37 U/L   ALT 34  0 - 53 U/L   Alkaline Phosphatase 121 (*) 39 - 117 U/L   Total Bilirubin 0.3  0.3 - 1.2 mg/dL   GFR calc non Af Amer >90  >90 mL/min   GFR calc Af Amer >90  >90 mL/min   Comment:            The eGFR has been calculated     using the CKD EPI equation.     This calculation has not been     validated in all clinical     situations.     eGFR's persistently     <90 mL/min signify     possible Chronic Kidney Disease.  TROPONIN I     Status: Abnormal   Collection Time    12/28/12  5:14 AM      Result Value Range   Troponin I 1.14 (*) <0.30 ng/mL   Comment: CRITICAL RESULT CALLED TO, READ BACK BY AND VERIFIED WITH:     ARMSTRONG J. AT 0454U ON [id number] BY THOMPSON S.                Due to the release kinetics of cTnI,     a negative result within the first hours     of the onset of symptoms does not rule out     myocardial infarction with certainty.     If myocardial infarction is still suspected,     repeat the test at appropriate intervals.  PRO B NATRIURETIC PEPTIDE     Status: Abnormal   Collection Time    12/28/12  5:14 AM      Result Value Range   Pro B Natriuretic peptide (BNP) 662.0 (*) 0 - 125 pg/mL  LIPASE, BLOOD     Status: Abnormal   Collection Time    12/28/12  5:14 AM      Result Value Range   Lipase 86 (*) 11 - 59 U/L  GLUCOSE, CAPILLARY     Status: Abnormal   Collection Time    12/28/12  8:35 AM      Result Value Range   Glucose-Capillary 199 (*) 70 - 99 mg/dL   Comment 1 Notify RN     Comment 2 Documented in Chart     Dg Chest 2 View  12/28/2012   *RADIOLOGY REPORT*  Clinical Data: Shortness of breath after the stenting yesterday. Chest pressure.  CHEST - 2 VIEW  Comparison: 11/14/2009  Findings: Stable appearance of postoperative changes in the mediastinum. The heart size and pulmonary vascularity are  normal. The lungs appear clear and expanded without focal air space disease or consolidation. No blunting of the costophrenic angles.  No pneumothorax.  Mediastinal contours appear intact.  Mild degenerative changes in the thoracic spine.  No significant change since previous study.  IMPRESSION: No evidence of active pulmonary disease.  Original Report Authenticated By: Burman Nieves, M.D.    Review of Systems  Constitutional: Negative for diaphoresis.  HENT: Negative for neck pain.   Respiratory: Positive for shortness of breath.   Cardiovascular: Positive for chest pain.  Musculoskeletal: Negative for back pain.  Neurological: Negative for loss of consciousness.  All other systems reviewed and are negative.    Blood pressure 115/59, pulse 92, temperature 98.7 F (37.1 C), temperature source Oral, resp. rate 23, height 5\' 8"  (1.727 m), weight 195 lb 8.8 oz (88.7 kg), SpO2 96.00%. Physical Exam  Constitutional: He is oriented to person, place, and time. He appears well-developed and well-nourished. No distress.  HENT:  Head: Normocephalic and atraumatic.  Neck: No JVD present. Carotid bruit is not present.  Cardiovascular: Normal rate, regular rhythm, normal heart sounds and intact distal pulses.  Exam reveals no gallop and no friction rub.   No murmur heard. Pulses:      Radial pulses are 2+ on the right side, and 2+ on the left side.       Dorsalis pedis pulses are 2+ on the right side, and 2+ on the left side.  Respiratory: Effort normal and breath sounds normal. No respiratory distress. He has no wheezes. He has no rales.  GI: Soft. He exhibits no distension and no mass. There is no tenderness.  Musculoskeletal: He exhibits no edema.  Neurological: He is alert and oriented to person, place, and time.  Skin: Skin is warm and dry. He is not diaphoretic.  Psychiatric: He has a normal mood and affect. His behavior is normal.     Assessment/Plan Principal Problem:   NSTEMI  (non-ST elevated myocardial infarction) Active Problems:   Diabetes mellitus with insulin therapy   Overweight   HTN (hypertension)   CAD (coronary artery disease)   Hyperlipidemia  Plan: 57 y/o male with known CAD, s/p CABG in 2006, admitted with NSTEMI. Currently CP free on IV nitro. EKG shows nonspecific ST changes but otherwise is normal. Will place heparin per pharmacy order. Will continue to trend cardiac enzymes. Will make NPO at midnight and plan for a diagnostic LHC +/- PCI tomorrow. Will order home meds, but will hold Metformin and HCTZ. He is already on a BB and statin. He has been on Plavix. Will check a P2Y12. MD to follow.  Allayne Butcher, PA-C   12/28/2012, 11:11 AM

## 2012-12-29 ENCOUNTER — Encounter (HOSPITAL_COMMUNITY)
Admission: EM | Disposition: A | Discharge status: Home / Self Care | Payer: Self-pay | Source: Home / Self Care | Attending: Cardiovascular Disease

## 2012-12-29 DIAGNOSIS — I214 Non-ST elevation (NSTEMI) myocardial infarction: Principal | ICD-10-CM

## 2012-12-29 DIAGNOSIS — I2510 Atherosclerotic heart disease of native coronary artery without angina pectoris: Secondary | ICD-10-CM

## 2012-12-29 HISTORY — PX: LEFT HEART CATHETERIZATION WITH CORONARY/GRAFT ANGIOGRAM: SHX5450

## 2012-12-29 LAB — BASIC METABOLIC PANEL
Calcium: 8.4 mg/dL (ref 8.4–10.5)
Creatinine, Ser: 0.83 mg/dL (ref 0.50–1.35)
GFR calc Af Amer: >90 mL/min (ref >90)
GFR calc non Af Amer: >90 mL/min (ref >90)
Sodium: 138 mEq/L (ref 135–145)

## 2012-12-29 LAB — PROTIME-INR
INR: 0.96 (ref 0.00–1.49)
Prothrombin Time: 12.6 seconds (ref 11.6–15.2)

## 2012-12-29 LAB — CBC
MCH: 31 pg (ref 26.0–34.0)
MCHC: 35.3 g/dL (ref 30.0–36.0)
Platelets: 187 10*3/uL (ref 150–400)
RBC: 3.81 MIL/uL — ABNORMAL LOW (ref 4.22–5.81)
RDW: 13.7 % (ref 11.5–15.5)

## 2012-12-29 LAB — TROPONIN I: Troponin I: 1.72 ng/mL (ref <0.30)

## 2012-12-29 LAB — GLUCOSE, CAPILLARY: Glucose-Capillary: 176 mg/dL — ABNORMAL HIGH (ref 70–99)

## 2012-12-29 LAB — LIPID PANEL
HDL: 25 mg/dL — ABNORMAL LOW (ref >39)
Total CHOL/HDL Ratio: 7 RATIO
VLDL: UNDETERMINED mg/dL (ref 0–40)

## 2012-12-29 LAB — POCT ACTIVATED CLOTTING TIME: Activated Clotting Time: 309 seconds

## 2012-12-29 SURGERY — PERCUTANEOUS CORONARY STENT INTERVENTION (PCI-S)

## 2012-12-29 MED ORDER — SODIUM CHLORIDE 0.9 % IJ SOLN
3.0000 mL | Freq: Two times a day (BID) | INTRAMUSCULAR | Status: DC
  Administered 2012-12-29: 22:00:00 3 mL via INTRAVENOUS

## 2012-12-29 MED ORDER — NITROGLYCERIN 0.2 MG/ML ON CALL CATH LAB
INTRAVENOUS | Status: AC
  Filled 2012-12-29: qty 1

## 2012-12-29 MED ORDER — SODIUM CHLORIDE 0.9 % IV SOLN: 250.0000 mL | INTRAVENOUS | Status: DC | PRN

## 2012-12-29 MED ORDER — BIVALIRUDIN 250 MG IV SOLR
0.2500 mg/kg/h | INTRAVENOUS | Status: DC
  Filled 2012-12-29: qty 250

## 2012-12-29 MED ORDER — HEPARIN (PORCINE) IN NACL 2-0.9 UNIT/ML-% IJ SOLN
INTRAMUSCULAR | Status: AC
  Filled 2012-12-29: qty 1000

## 2012-12-29 MED ORDER — TICAGRELOR 90 MG PO TABS
90.0000 mg | ORAL_TABLET | Freq: Once | ORAL | Status: AC
  Administered 2012-12-29: 90 mg via ORAL
  Filled 2012-12-29: qty 1

## 2012-12-29 MED ORDER — SODIUM CHLORIDE 0.9 % IJ SOLN: 3.0000 mL | INTRAMUSCULAR | Status: DC | PRN

## 2012-12-29 MED ORDER — LIDOCAINE HCL (PF) 1 % IJ SOLN
INTRAMUSCULAR | Status: AC
  Filled 2012-12-29: qty 30

## 2012-12-29 MED ORDER — TICAGRELOR 90 MG PO TABS
90.0000 mg | ORAL_TABLET | Freq: Two times a day (BID) | ORAL | Status: DC
  Administered 2012-12-30: 90 mg via ORAL
  Filled 2012-12-29 (×2): qty 1

## 2012-12-29 MED ORDER — DILTIAZEM HCL ER COATED BEADS 120 MG PO CP24
120.0000 mg | ORAL_CAPSULE | Freq: Every day | ORAL | Status: DC
  Administered 2012-12-30: 09:00:00 120 mg via ORAL
  Filled 2012-12-29: qty 1

## 2012-12-29 MED ORDER — MIDAZOLAM HCL 2 MG/2ML IJ SOLN
INTRAMUSCULAR | Status: AC
  Filled 2012-12-29: qty 2

## 2012-12-29 MED ORDER — FENTANYL CITRATE 0.05 MG/ML IJ SOLN
INTRAMUSCULAR | Status: AC
  Filled 2012-12-29: qty 2

## 2012-12-29 MED ORDER — MORPHINE SULFATE 2 MG/ML IJ SOLN: 1.0000 mg | INTRAMUSCULAR | Status: DC | PRN

## 2012-12-29 MED ORDER — TICAGRELOR 90 MG PO TABS
ORAL_TABLET | ORAL | Status: AC
  Filled 2012-12-29: qty 2

## 2012-12-29 MED ORDER — BIVALIRUDIN 250 MG IV SOLR
INTRAVENOUS | Status: AC
  Filled 2012-12-29: qty 250

## 2012-12-29 MED ORDER — SODIUM CHLORIDE 0.9 % IV SOLN: 1.0000 mL/kg/h | INTRAVENOUS | Status: AC

## 2012-12-29 NOTE — Progress Notes (Signed)
[facility]  Subjective: Pt had mild chest tightness overnight. Pain was relieved with increase in IV Nitro. Currently CP free.   Objective: Vital signs in last 24 hours: Temp:  [97.6 F (36.4 C)-98.7 F (37.1 C)] 98 F (36.7 C) (07/28 0400) Pulse Rate:  [47-92] 61 (07/28 0700) Resp:  [11-55] 12 (07/28 0700) BP: (97-153)/(51-79) 108/57 mmHg (07/28 0700) SpO2:  [94 %-100 %] 97 % (07/28 0700) Weight:  [195 lb 8.8 oz (88.7 kg)] 195 lb 8.8 oz (88.7 kg) (07/27 0950) Last BM Date: 12/27/12  Intake/Output from previous day: 07/27 0701 - 07/28 0700 In: 1264.8 [P.O.:480; I.V.:784.8] Out: 1600 [Urine:1600] Intake/Output this shift:    Medications Current Facility-Administered Medications  Medication Dose Route Frequency Provider Last Rate Last Dose  . 0.9 %  sodium chloride infusion   Intravenous Continuous Mihai Croitoru, MD   20 mL/hr at 12/28/12 1000  . 0.9 %  sodium chloride infusion  250 mL Intravenous PRN Brittainy Simmons, PA-C      . 0.9 %  sodium chloride infusion   Intravenous Continuous Brittainy Simmons, PA-C 75 mL/hr at 12/29/12 0415    . acetaminophen (TYLENOL) tablet 650 mg  650 mg Oral Q4H PRN Brittainy Simmons, PA-C   650 mg at 12/28/12 1643  . aspirin chewable tablet 324 mg  324 mg Oral NOW Brittainy Simmons, PA-C       Or  . aspirin suppository 300 mg  300 mg Rectal NOW Brittainy Simmons, PA-C      . [START ON 12/30/2012] aspirin EC tablet 81 mg  81 mg Oral Daily Brittainy Simmons, PA-C      . atorvastatin (LIPITOR) tablet 20 mg  20 mg Oral q1800 Brittainy Simmons, PA-C   20 mg at 12/28/12 1734  . carvedilol (COREG) tablet 25 mg  25 mg Oral BID WC Brittainy Simmons, PA-C   25 mg at 12/28/12 1643  . Chlorhexidine Gluconate Cloth 2 % PADS 6 each  6 each Topical Q0600 Mihai Croitoru, MD   6 each at 12/29/12 0600  . diltiazem (CARDIZEM CD) 24 hr capsule 180 mg  180 mg Oral Daily Brittainy Simmons, PA-C   180 mg at 12/28/12 1252  . heparin  ADULT infusion 100 units/mL (25000 units/250 mL)  1,300 Units/hr Intravenous Continuous Mihai Croitoru, MD 13 mL/hr at 12/29/12 0538 1,300 Units/hr at 12/29/12 0538  . insulin aspart (novoLOG) injection 0-13 Units  0-13 Units Subcutaneous TID AC Brittainy Simmons, PA-C   10 Units at 12/28/12 1735  . insulin glargine (LANTUS) injection 60 Units  60 Units Subcutaneous QHS Brittainy Simmons, PA-C   60 Units at 12/28/12 2216  . lisinopril (PRINIVIL,ZESTRIL) tablet 40 mg  40 mg Oral BID Brittainy Simmons, PA-C   40 mg at 12/28/12 2216  . mupirocin ointment (BACTROBAN) 2 % 1 application  1 application Nasal BID Mihai Croitoru, MD   1 application at 12/28/12 2216  . nitroGLYCERIN (NITROSTAT) SL tablet 0.4 mg  0.4 mg Sublingual Q5 Min x 3 PRN Brittainy Simmons, PA-C   0.4 mg at 12/28/12 2317  . nitroGLYCERIN 0.2 mg/mL in dextrose 5 % infusion  3-40 mcg/min Intravenous Titrated Jacob Kelly, MD 12 mL/hr at 12/28/12 2324 40 mcg/min at 12/28/12 2324  . omega-3 acid ethyl esters (LOVAZA) capsule 2 g  2 g Oral BID Brittainy Simmons, PA-C   2 g at 12/28/12 2216  . ondansetron (ZOFRAN) injection 4 mg  4 mg Intravenous Q6H PRN Brittainy Simmons, PA-C      .   sodium chloride 0.9 % injection 3 mL  3 mL Intravenous Q12H Brittainy Simmons, PA-C      . sodium chloride 0.9 % injection 3 mL  3 mL Intravenous PRN Brittainy Simmons, PA-C        PE: General appearance: alert, cooperative and no distress Lungs: clear to auscultation bilaterally Heart: regular rate and rhythm, S1, S2 normal, no murmur, click, rub or gallop Extremities: no LEE Pulses: 2+ and symmetric Skin: warm and dry Neurologic: Grossly normal  Lab Results:   Recent Labs  12/28/12 0514 12/29/12 0438  WBC 4.9 6.7  HGB 13.2 11.8*  HCT 37.3* 33.4*  PLT 224 187   BMET  Recent Labs  12/28/12 0514 12/29/12 0438  NA 137 138  K 4.0 3.9  CL 102 105  CO2 23 25  GLUCOSE 284* 179*  BUN 16 13  CREATININE 0.81 0.83  CALCIUM 9.0 8.4    PT/INR  Recent Labs  12/28/12 1215 12/29/12 0438  LABPROT 12.5 12.6  INR 0.95 0.96   Cholesterol  Recent Labs  12/29/12 0438  CHOL 175   Cardiac Enzymes Cardiac Panel (last 3 results)  Recent Labs  12/28/12 1215 12/28/12 1725 12/28/12 2319  TROPONINI 1.20* 1.66* 1.72*    Assessment/Plan  Principal Problem:   NSTEMI (non-ST elevated myocardial infarction) Active Problems:   Diabetes mellitus with insulin therapy   Overweight   HTN (hypertension)   CAD (coronary artery disease)   Hyperlipidemia  Plan: Admitted yesterday for NSTEMI with troponin of 1.72. He is on IV heparin and IV NTG. He had recurrence of chest tightness last night that was relieved with up titration of IV NTG. No CP currently. EKG this morning shows nonspecific ST abnormalities, unchanged from yesterday. INR is WNL. Renal function is normal with SCr of 0.83. HR and BP both stable. He has been NPO since midnight. Plan for diagnostic LHC +/- PCI today.     LOS: 1 day    Brittainy M. Simmons, PA-C 12/29/2012 8:19 AM  I have seen and evaluated the patient this AM along with Brittany Simmons, PA. I agree with her findings, examination as well as impression recommendations.  Presented with NSTEMI - Inferior ST-T changes noted -- that do seem new from June.  Mild CP last PM controlled with NTG.  He does have known 90% rPDA lesion.  Plan LHC with native & graft cor angio +/- PCI today.  BP & HR are stable (borderline hypotensive).  -- on Diltiazem, max dose Coreg BB & max dose ACE-I.  (may be able to back down on CCB.  On statin & Lovaza.  Other labs are stable.   Performing MD:  [name],Shashank W, M.D., M.S.  Procedure:  LHC WITH NATIVE & GRAFT ANGIOGRAPHY +/- PCI  The procedure with Risks/Benefits/Alternatives and Indications was reviewed with the patient and family.  All questions were answered.    Risks / Complications include, but not limited to: Death, MI, CVA/TIA, VF/VT (with  defibrillation), Bradycardia (need for temporary pacer placement), contrast induced nephropathy, bleeding / bruising / hematoma / pseudoaneurysm, vascular or coronary injury (with possible emergent CT or Vascular Surgery), adverse medication reactions, infection.    The patient (and family) voice understanding and agree to proceed.     MD Time with pt: 10 min  [name],Allon W, M.D., M.S. [facility] [address] [place], [place]  [address]  [phone] Pager # [phone] 12/29/2012 11:27 AM     

## 2012-12-29 NOTE — H&P (View-Only) (Signed)
[facility]  Subjective: Pt had mild chest tightness overnight. Pain was relieved with increase in IV Nitro. Currently CP free.   Objective: Vital signs in last 24 hours: Temp:  [97.6 F (36.4 C)-98.7 F (37.1 C)] 98 F (36.7 C) (07/28 0400) Pulse Rate:  [47-92] 61 (07/28 0700) Resp:  [11-55] 12 (07/28 0700) BP: (97-153)/(51-79) 108/57 mmHg (07/28 0700) SpO2:  [94 %-100 %] 97 % (07/28 0700) Weight:  [195 lb 8.8 oz (88.7 kg)] 195 lb 8.8 oz (88.7 kg) (07/27 0950) Last BM Date: 12/27/12  Intake/Output from previous day: 07/27 0701 - 07/28 0700 In: 1264.8 [P.O.:480; I.V.:784.8] Out: 1600 [Urine:1600] Intake/Output this shift:    Medications Current Facility-Administered Medications  Medication Dose Route Frequency Provider Last Rate Last Dose  . 0.9 %  sodium chloride infusion   Intravenous Continuous Mihai Croitoru, MD   20 mL/hr at 12/28/12 1000  . 0.9 %  sodium chloride infusion  250 mL Intravenous PRN Brittainy Simmons, PA-C      . 0.9 %  sodium chloride infusion   Intravenous Continuous Brittainy Simmons, PA-C 75 mL/hr at 12/29/12 0415    . acetaminophen (TYLENOL) tablet 650 mg  650 mg Oral Q4H PRN Brittainy Simmons, PA-C   650 mg at 12/28/12 1643  . aspirin chewable tablet 324 mg  324 mg Oral NOW Brittainy Simmons, PA-C       Or  . aspirin suppository 300 mg  300 mg Rectal NOW Brittainy Simmons, PA-C      . [START ON 12/30/2012] aspirin EC tablet 81 mg  81 mg Oral Daily Brittainy Simmons, PA-C      . atorvastatin (LIPITOR) tablet 20 mg  20 mg Oral q1800 Brittainy Simmons, PA-C   20 mg at 12/28/12 1734  . carvedilol (COREG) tablet 25 mg  25 mg Oral BID WC Brittainy Simmons, PA-C   25 mg at 12/28/12 1643  . Chlorhexidine Gluconate Cloth 2 % PADS 6 each  6 each Topical Q0600 Mihai Croitoru, MD   6 each at 12/29/12 0600  . diltiazem (CARDIZEM CD) 24 hr capsule 180 mg  180 mg Oral Daily Brittainy Simmons, PA-C   180 mg at 12/28/12 1252  . heparin  ADULT infusion 100 units/mL (25000 units/250 mL)  1,300 Units/hr Intravenous Continuous Mihai Croitoru, MD 13 mL/hr at 12/29/12 0538 1,300 Units/hr at 12/29/12 0538  . insulin aspart (novoLOG) injection 0-13 Units  0-13 Units Subcutaneous TID AC Brittainy Simmons, PA-C   10 Units at 12/28/12 1735  . insulin glargine (LANTUS) injection 60 Units  60 Units Subcutaneous QHS Brittainy Simmons, PA-C   60 Units at 12/28/12 2216  . lisinopril (PRINIVIL,ZESTRIL) tablet 40 mg  40 mg Oral BID Brittainy Simmons, PA-C   40 mg at 12/28/12 2216  . mupirocin ointment (BACTROBAN) 2 % 1 application  1 application Nasal BID Thurmon Fair, MD   1 application at 12/28/12 2216  . nitroGLYCERIN (NITROSTAT) SL tablet 0.4 mg  0.4 mg Sublingual Q5 Min x 3 PRN Brittainy Simmons, PA-C   0.4 mg at 12/28/12 2317  . nitroGLYCERIN 0.2 mg/mL in dextrose 5 % infusion  3-40 mcg/min Intravenous Titrated Leeann Must, MD 12 mL/hr at 12/28/12 2324 40 mcg/min at 12/28/12 2324  . omega-3 acid ethyl esters (LOVAZA) capsule 2 g  2 g Oral BID Brittainy Simmons, PA-C   2 g at 12/28/12 2216  . ondansetron (ZOFRAN) injection 4 mg  4 mg Intravenous Q6H PRN Brittainy Simmons, PA-C      .  sodium chloride 0.9 % injection 3 mL  3 mL Intravenous Q12H Brittainy Simmons, PA-C      . sodium chloride 0.9 % injection 3 mL  3 mL Intravenous PRN Robbie Lis, PA-C        PE: General appearance: alert, cooperative and no distress Lungs: clear to auscultation bilaterally Heart: regular rate and rhythm, S1, S2 normal, no murmur, click, rub or gallop Extremities: no LEE Pulses: 2+ and symmetric Skin: warm and dry Neurologic: Grossly normal  Lab Results:   Recent Labs  12/28/12 0514 12/29/12 0438  WBC 4.9 6.7  HGB 13.2 11.8*  HCT 37.3* 33.4*  PLT 224 187   BMET  Recent Labs  12/28/12 0514 12/29/12 0438  NA 137 138  K 4.0 3.9  CL 102 105  CO2 23 25  GLUCOSE 284* 179*  BUN 16 13  CREATININE 0.81 0.83  CALCIUM 9.0 8.4    PT/INR  Recent Labs  12/28/12 1215 12/29/12 0438  LABPROT 12.5 12.6  INR 0.95 0.96   Cholesterol  Recent Labs  12/29/12 0438  CHOL 175   Cardiac Enzymes Cardiac Panel (last 3 results)  Recent Labs  12/28/12 1215 12/28/12 1725 12/28/12 2319  TROPONINI 1.20* 1.66* 1.72*    Assessment/Plan  Principal Problem:   NSTEMI (non-ST elevated myocardial infarction) Active Problems:   Diabetes mellitus with insulin therapy   Overweight   HTN (hypertension)   CAD (coronary artery disease)   Hyperlipidemia  Plan: Admitted yesterday for NSTEMI with troponin of 1.72. He is on IV heparin and IV NTG. He had recurrence of chest tightness last night that was relieved with up titration of IV NTG. No CP currently. EKG this morning shows nonspecific ST abnormalities, unchanged from yesterday. INR is WNL. Renal function is normal with SCr of 0.83. HR and BP both stable. He has been NPO since midnight. Plan for diagnostic LHC +/- PCI today.     LOS: 1 day    Brittainy M. Delmer Islam 12/29/2012 8:19 AM  I have seen and evaluated the patient this AM along with Boyce Medici, PA. I agree with her findings, examination as well as impression recommendations.  Presented with NSTEMI - Inferior ST-T changes noted -- that do seem new from June.  Mild CP last PM controlled with NTG.  He does have known 90% rPDA lesion.  Plan LHC with native & graft cor angio +/- PCI today.  BP & HR are stable (borderline hypotensive).  -- on Diltiazem, max dose Coreg BB & max dose ACE-I.  (may be able to back down on CCB.  On statin & Lovaza.  Other labs are stable.   Performing MD:  Marykay Lex, M.D., M.S.  Procedure:  LHC WITH NATIVE & GRAFT ANGIOGRAPHY +/- PCI  The procedure with Risks/Benefits/Alternatives and Indications was reviewed with the patient and family.  All questions were answered.    Risks / Complications include, but not limited to: Death, MI, CVA/TIA, VF/VT (with  defibrillation), Bradycardia (need for temporary pacer placement), contrast induced nephropathy, bleeding / bruising / hematoma / pseudoaneurysm, vascular or coronary injury (with possible emergent CT or Vascular Surgery), adverse medication reactions, infection.    The patient (and family) voice understanding and agree to proceed.     MD Time with pt: 10 min  [name],Kaiyan W, M.D., M.S. [facility] [address] [place], [place]  [address]  [phone] Pager # [phone] 12/29/2012 11:27 AM

## 2012-12-29 NOTE — Progress Notes (Signed)
ANTICOAGULATION CONSULT NOTE - Follow Up  Pharmacy Consult for Heparin Indication: CP/NSTEMI  Allergies  Allergen Reactions  . Crestor (Rosuvastatin) Other (See Comments)    myalgia   Patient Measurements: Height: 5\' 8"  (172.7 cm) Weight: 195 lb 8.8 oz (88.7 kg) IBW/kg (Calculated) : 68.4 Heparin Dosing Weight: 86.5kg  Vital Signs: Temp: 98.1 F (36.7 C) (07/27 2000) Temp src: Oral (07/27 2000) BP: 128/65 mmHg (07/27 2325) Pulse Rate: 64 (07/27 2325)  Labs:  Recent Labs  12/28/12 0514 12/28/12 1215 12/28/12 1725 12/28/12 2319  HGB 13.2  --   --   --   HCT 37.3*  --   --   --   PLT 224  --   --   --   LABPROT  --  12.5  --   --   INR  --  0.95  --   --   HEPARINUNFRC  --   --   --  0.39  CREATININE 0.81  --   --   --   TROPONINI 1.14* 1.20* 1.66*  --    Estimated Creatinine Clearance: 111.5 ml/min (by C-G formula based on Cr of 0.81).  Medical History: Past Medical History  Diagnosis Date  . Hyperplastic colon polyp 07/16/07    30cm   . Diabetes mellitus     insulin  . CAD (coronary artery disease)     [facility]  . Hyperlipemia   . HTN (hypertension)   . Family hx of colon cancer   . Diverticulosis   . Atrial fibrillation   . CHF (congestive heart failure) 11/08/2010    2D Echo EF=>55%  . Dyspnea   . OSA (obstructive sleep apnea) 12/21/2007    sleep study perform REM REM and NREM was 95.0%, AHI of 12.31hr and RDI of 12.4hr   Medications:  Prescriptions prior to admission  Medication Sig Dispense Refill  . aspirin 81 MG EC tablet Take 81 mg by mouth daily.        [name] atorvastatin (LIPITOR) 40 MG tablet Take 20 mg by mouth daily.       . Canagliflozin (INVOKANA) 100 MG TABS Take 1 tablet by mouth daily.       . carvedilol (COREG) 25 MG tablet Take 25 mg by mouth 2 (two) times daily with a meal.        . Cholecalciferol (VITAMIN D3) 1000 UNITS CAPS Take 2 capsules by mouth daily.      . clopidogrel (PLAVIX) 75 MG tablet Take 75 mg by mouth  daily.        [name] diltiazem (CARDIZEM CD) 180 MG 24 hr capsule Take 1 capsule (180 mg total) by mouth daily.  90 capsule  3  . furosemide (LASIX) 20 MG tablet Take 20 mg by mouth daily as needed.       . insulin aspart (NOVOLOG) 100 UNIT/ML injection Inject 10 Units into the skin 3 (three) times daily before meals.      . insulin glargine (LANTUS) 100 UNIT/ML injection Inject 60 Units into the skin at bedtime.       [name] lisinopril (PRINIVIL,ZESTRIL) 40 MG tablet Take 40 mg by mouth 2 (two) times daily.       . meloxicam (MOBIC) 7.5 MG tablet Take 7.5 mg by mouth daily.      . metFORMIN (GLUCOPHAGE) 1000 MG tablet Take 1,000 mg by mouth 2 (two) times daily with a meal.      . Multiple Vitamin (MULTIVITAMIN) capsule Take 1 capsule by  mouth daily.        [name] omega-3 acid ethyl esters (LOVAZA) 1 G capsule Take 2 capsules (2 g total) by mouth 2 (two) times daily.  360 capsule  3  . vitamin C (ASCORBIC ACID) 500 MG tablet Take 500 mg by mouth daily.      . hydrochlorothiazide (MICROZIDE) 12.5 MG capsule Take 12.5 mg by mouth daily.       Assessment: 56yom transferred from [facility] to start heparin for CP/NSTEMI. Patient received Lovenox 100mg  this AM ~0620 - will not bolus and 1150delay heparin drip start until ~1600. Patient reports no bleeding and not taking any anticoagulants.  He continues to have some CP.  His initial heparin level is 0.39 on IV heparin rate of 1150 units/hr.  No noted bleeding complications.  Goal of Therapy:  Heparin level 0.3-0.7 units/ml Monitor platelets by anticoagulation protocol: Yes   Plan:  1.  Continue IV Heparin drip at 1150 units/hr (11.5 ml/hr) 2.  Check heparin level daily and CBC  Nadara Mustard, PharmD., MS Clinical Pharmacist Pager:  [phone] Thank you for allowing pharmacy to be part of this patients care team. 12/29/2012,12:00 AM

## 2012-12-29 NOTE — Interval H&P Note (Signed)
History and Physical Interval Note:  12/29/2012 11:55 AM  Francis Hall  has presented today for surgery, with the diagnosis of NSTEMI  The various methods of treatment have been discussed with the patient and family. After consideration of risks, benefits and other options for treatment, the patient has consented to  Procedure(s): LEFT HEART CATHETERIZATION WITH CORONARY ANGIOGRAM (N/A) & GRAFT ANGIOGRAHY +/- PCI as a surgical intervention .  The patient's history has been reviewed, patient examined, no change in status, stable for surgery.  I have reviewed the patient's chart and labs.  Questions were answered to the patient's satisfaction.    Cath Lab Visit (complete for each Cath Lab visit)  Clinical Evaluation Leading to the Procedure:   ACS: yes  Non-ACS:    Anginal Classification: CCS IV  Anti-ischemic medical therapy: Maximal Therapy (2 or more classes of medications)  Non-Invasive Test Results: No non-invasive testing performed  Prior CABG: Previous CABG  Francis Hall

## 2012-12-29 NOTE — CV Procedure (Signed)
CARDIAC CATHETERIZATION AND PERCUTANEOUS INTERVENTION REPORT  NAME:  ARMARI FUSSELL   MRN: [id number] DOB:  1955-07-04   ADMIT DATE: 12/28/2012 Procedure Date: 12/29/2012   INTERVENTIONAL CARDIOLOGIST: Marykay Lex, M.D., MS PRIMARY CARE PROVIDER: Colette Ribas, MD PRIMARY CARDIOLOGIST:  Thurmon Fair, MD  PATIENT:  Francis Hall is a 57 y.o. male with a PMH of CAD-CABGx2 in 2006 (LIMA-LAD, fRad-OM) who has done well since, but presented on 12/28/12 with an acute coronary syndrome and ruled in for NSTEMI.  He did have intermittent anginal pain last PM, relieved by NTG.  He is now referred for cardiac catheterization +/- PCI.   PRE-OPERATIVE DIAGNOSIS:    NSTEMI  KNOWN CAD - PRIOR CABG & DISTAL RPL STENOSIS.  PROCEDURES PERFORMED:    LEFT HEART CATHETERIZATION WITH NATIVE & GRAFT ANGIOGRAPHY  COMPLEX BIFURCATION PCI / PTCA OF DISTAL RCA INTO PROXIMAL RPL (PROMUS PREMIER DES 2.25 MM X 16 MM - POST DILATED TO 2.5 MM) WITH PTCA OF OSTIAL RPDA USING 2.5 MM BALLOON  PCI OF LONG MID RCA LESION - PROMUS PREMIER DES 2.5 MM X 38 MM (OST-DILATED TO 3.0 MM)  PROCEDURE:Consent:  Risks of procedure as well as the alternatives and risks of each were explained to the (patient/caregiver).  Consent for procedure obtained. Consent for signed by MD and patient with RN witness -- placed on chart.   PROCEDURE: The patient was brought to the 2nd Floor [facility] in the fasting state and prepped and draped in the usual sterile fashion for Right groin or radial access. A modified Allen's test with plethysmography was performed, revealing excellent Ulnar artery collateral flow.  Sterile technique was used including antiseptics, cap, gloves, gown, hand hygiene, mask and sheet.  Skin prep: Chlorhexidine.  Time Out: Verified patient identification, verified procedure, site/side was marked, verified correct patient position, special equipment/implants available,  medications/allergies/relevent history reviewed, required imaging and test results available.  Performed  Access: Right Common Femoral Artery; 5 Fr Sheath -- fluoroscopically guided modified Seldinger technique   Diagnostic:  5Fr JL4, JR 4, Angled Pigtail catheters advanced & exchanged over J-wire.  Left Coronary Artery Angiography: JL4  Right Coronary Artery, fRad-OM, LIMA-LAD Angiography: JR4  LV Hemodynamics (LV Gram): Angled Pigtail  Sheath:  Exchanged to 6 Fr for PCI, post-PCI the sheath was sutured in place  MEDICATIONS:  Anesthesia:  Local Lidocaine 18 ml  Sedation:  1 mg IV Versed, 25 mcg IV fentanyl ;   Omnipaque Contrast: 225 ml  Anticoagulation:  Angiomax Bolus & drip  Anti-Platelet Agent:  Brilinta 180mg   Hemodynamics:  Central Aortic / Mean Pressures: 124/86 mmHg;   Left Ventricular Pressures / EDP: 122/4 mmHg; 14 mmHg  Left Ventriculography:  EF: 50-55%  Wall Motion: basal to mid inferior hypokinesis  Coronary Anatomy: Left Main:  LAD:  100% Occluded proximal after D1  D1: Large caliber vessel that courses along the anterolateral wall, minimal luminal irregularities  LIMA-LAD- widely patent with retrograde filling to D2; distal LAD is small & tapers to the apex.  Left Circumflex: 100% occluded @ ostium  SVG - OM1: widely patent with antegrade flow filling a major lateral OM branch, retrograde filling to AV-Groove branch that goes to an OM2.  Antegrade OM1 - minimal luminal irregularities.    Retrograde flow has a ~60-70% stenosis in the proximal OM1.  Downstream flow in AV-Groove-OM2 reveals diffuse mild luminal irregularities    RCA: Large caliber, dominant vessel with a long segment of calcified, irregular stenosis  ranging from ~50-70% with a focal "napkin ring" stenosis in the middle of the diseased segment (Lesion #1).  The vessel normalizes before a distal bifurcation lesion involving the distal RCA and the ostium of both the    Right Posterior  AV Groove Branch (RPAV / RPLA) and the RPDA.  ~80-90% into the RPAV/RPLA & ~70% into the RPDA.  RPDA: Moderate caliber vessel, ostial ~70% tubular stenosis, then minimal luminal irregularities. RPLASysytem:The RPAV has ostial-proximal ~80% stenosis that ends just prior to the AV Nodal artery.  It then bifurcates into RPL1 & 2.  Mid RPL2 (just after the bend) has a long ~70-80% stenosis that was previously described.  Reviewing the preliminary angiography revealed a complex set of lesions involving the mid RCA that would require a very long stent, as well as bifurcation distal RCA-RPLA-RPDA and a distal RPL2 lesion.  The most distal lesion is essentially unchanged from his latest catheterization.  I discussed the findings with Dr. Allyson Sabal, who reviewed the images along with me, and we decided to perform bifurcation PCI/PTCA followed by direct mid RCA stenting, then treat the most distal RPL2 lesion medically.  Percutaneous Coronary Intervention:  Sheath exchanged for 6 Fr Guide: 6 Fr   JR4 Guidewires: [place] - PDA & mid RCA, [place] - PLA  Lesion #1 - bifurcation dRDA-PLA & PDA  PDA -  BMW wire: after stent to RPLA, redirected into the RPDA for "rescue PTCA" Predilation Balloon: Angiosculpt 2.0 mm x 10 mm;   8 Atm x 60 Sec,  Following Stent to RPAV, the wire was advanced into the RPAV through the stent, the redirected through the stent struts into the PDA. Post-dilation Balloon: [place] Quantum Apex 2.5 mm x 12 mm;   6 Atm x 60 Sec (~20% residual stenosis)  dRCA-pRPL Predilation Balloon: Angiosculpt 2.0 mm x 10 mm;   8 Atm x 32 Sec, 8 Atm x 35 Sec Stent: Promus Premier DES 2.25 mm x 16 mm;   14 Atm x 35 Sec Post-dilation Balloon: [place] Quantum Apex 2.5 mm x 12 mm;   16 Atm x 45 Sec,   Final Diameter: 2.55 mm   Lesion #2: m RCA diffuse 50-70 with a focal 90% in the middle -- irregular / hazy & calcified.  Stent: Promus Premeir 2.5 mm x 38 mm;   18 Atm x 60 Sec -- diameter: 2.8  mm Post-dilation Balloon: [place] Quantum Apex 3.0 mm x 20 mm;   12 Atm x 60 Sec, 14 Atm x 45 Sec  Final Diameter: 3.0 mm  Post deployment angiography in multiple views, with and without guidewire in place revealed excellent stent deployment and lesion coverage.  There was no evidence of dissection or perforation.  The now "jailed" RPDA has mild, but acceptable residual ~20% ostial-proximal stenosis.  PATIENT DISPOSITION:    The patient was transferred to the PACU holding area in a hemodynamicaly stable, chest pain free condition.  The patient tolerated the procedure well, and there were no complications.  EBL:   < 20 ml  The patient was stable before, during, and after the procedure.  POST-OPERATIVE DIAGNOSIS:    Severe native CAD with known LAD & Circumflex occlusion, but newly noted mult-site severe disease of the mid and distal RCA with a bifurcation 1,1,1 lesion involving the RPLA & RPDA  Successful multi-site PCI of the long mid RCA lesion as well as the distal RCA-RPLA lesion with PTCA of the ostial-proximal RPDA.  Widely patent LAD and free Radial to the OM.  Low normal LVEF with mild basal to mid Inferior hypokinesis.   PLAN OF CARE:  Standard post catheterization care in post-procedure unit  Dual Antiplatelet Therapy for a minimum of one year  Continue to titrate cardiac medications.   Marykay Lex, M.D., M.S. [facility] [address] [place], [place]  [address]  [phone]  12/29/2012 2:08 PM

## 2012-12-29 NOTE — Progress Notes (Signed)
Site area: right groin  Site Prior to Removal:  Level 0  Pressure Applied For 20 MINUTES    Minutes Beginning at 1815  Manual:   yes  Patient Status During Pull:  stable  Post Pull Groin Site:  Level 0  Post Pull Instructions Given:  yes  Post Pull Pulses Present:  yes  Dressing Applied:  yes, gauze pressure dressing  Comments:  1905 gauze dressing dry and intact when rechecked. Remains level 0

## 2012-12-29 NOTE — Progress Notes (Signed)
ANTICOAGULATION CONSULT NOTE - Pharmacy Consult for Heparin Indication: CP/NSTEMI  Allergies  Allergen Reactions  . Crestor (Rosuvastatin) Other (See Comments)    myalgia   Patient Measurements: Height: 5\' 8"  (172.7 cm) Weight: 195 lb 8.8 oz (88.7 kg) IBW/kg (Calculated) : 68.4 Heparin Dosing Weight: 86.5kg  Vital Signs: Temp: 98 F (36.7 C) (07/28 0400) Temp src: Oral (07/28 0400) BP: 105/51 mmHg (07/28 0400) Pulse Rate: 58 (07/28 0400)  Labs:  Recent Labs  12/28/12 0514 12/28/12 1215 12/28/12 1725 12/28/12 2319 12/29/12 0438  HGB 13.2  --   --   --  11.8*  HCT 37.3*  --   --   --  33.4*  PLT 224  --   --   --  187  LABPROT  --  12.5  --   --  12.6  INR  --  0.95  --   --  0.96  HEPARINUNFRC  --   --   --  0.39 0.24*  CREATININE 0.81  --   --   --   --   TROPONINI 1.14* 1.20* 1.66* 1.72*  --    Estimated Creatinine Clearance: 111.5 ml/min (by C-G formula based on Cr of 0.81).  Assessment: 57 y.o. male with chest pain for heparin   Goal of Therapy:  Heparin level 0.3-0.7 units/ml Monitor platelets by anticoagulation protocol: Yes   Plan:  Increase Heparin 1300 units/hr F/U after cath  Geannie Risen, PharmD, BCPS  12/29/2012,5:36 AM

## 2012-12-30 DIAGNOSIS — I2510 Atherosclerotic heart disease of native coronary artery without angina pectoris: Secondary | ICD-10-CM

## 2012-12-30 LAB — CBC
MCH: 29.8 pg (ref 26.0–34.0)
MCV: 87.9 fL (ref 78.0–100.0)
Platelets: 168 10*3/uL (ref 150–400)
RBC: 3.89 MIL/uL — ABNORMAL LOW (ref 4.22–5.81)

## 2012-12-30 LAB — GLUCOSE, CAPILLARY: Glucose-Capillary: 166 mg/dL — ABNORMAL HIGH (ref 70–99)

## 2012-12-30 LAB — BASIC METABOLIC PANEL
BUN: 8 mg/dL (ref 6–23)
CO2: 25 mEq/L (ref 19–32)
Calcium: 8.9 mg/dL (ref 8.4–10.5)
Creatinine, Ser: 0.77 mg/dL (ref 0.50–1.35)

## 2012-12-30 MED ORDER — DILTIAZEM HCL ER COATED BEADS 120 MG PO CP24: 120.0000 mg | ORAL_CAPSULE | Freq: Every day | ORAL | Status: DC

## 2012-12-30 MED ORDER — NITROGLYCERIN 0.4 MG SL SUBL: 0.4000 mg | SUBLINGUAL_TABLET | SUBLINGUAL | Status: DC | PRN

## 2012-12-30 MED ORDER — MUPIROCIN 2 % EX OINT: 1.0000 "application " | TOPICAL_OINTMENT | Freq: Two times a day (BID) | CUTANEOUS | Status: DC

## 2012-12-30 MED ORDER — HYDROCHLOROTHIAZIDE 12.5 MG PO CAPS: 12.5000 mg | ORAL_CAPSULE | Freq: Every day | ORAL | Status: DC | PRN

## 2012-12-30 MED ORDER — TICAGRELOR 90 MG PO TABS: 90.0000 mg | ORAL_TABLET | Freq: Two times a day (BID) | ORAL | Status: DC

## 2012-12-30 MED ORDER — ACETAMINOPHEN 325 MG PO TABS: 650.0000 mg | ORAL_TABLET | ORAL | Status: DC | PRN

## 2012-12-30 MED FILL — Sodium Chloride IV Soln 0.9%: INTRAVENOUS | Qty: 50 | Status: AC

## 2012-12-30 NOTE — Care Management Note (Addendum)
  Page 1 of 1   12/30/2012     11:07:55 AM   CARE MANAGEMENT NOTE 12/30/2012  Patient:  [facility]   Account Number:  [id number]  Date Initiated:  12/29/2012  Documentation initiated by:  Junius Creamer  Subjective/Objective Assessment:   adm w mi     Action/Plan:   lives w wife, pcp dr Assunta Found   Anticipated DC Date:  12/30/2012   Anticipated DC Plan:  HOME/SELF CARE      DC Planning Services  CM consult      Choice offered to / List presented to:             Status of service:   Medicare Important Message given?   (If response is "NO", the following Medicare IM given date fields will be blank) Date Medicare IM given:   Date Additional Medicare IM given:    Discharge Disposition:  HOME/SELF CARE  Per UR Regulation:  Reviewed for med. necessity/level of care/duration of stay  If discussed at Long Length of Stay Meetings, dates discussed:    Comments:  12/30/12 [address], RN,BSN, NSM [phone] Spoke with pt and wife at bedside regarding Brilinta medication.  Pt given Brilinta brochure with 30 day free card and prescription refill assistance card intact. Pt utilizes [facility] in [place] for prescription needs. NCM called [facility] to ensure prescribed medication in stock. Will report results of benefits check when available.

## 2012-12-30 NOTE — Progress Notes (Signed)
CARDIAC REHAB PHASE I   PRE:  Rate/Rhythm: 66 SR    BP: sitting 141/61    SaO2:   MODE:  Ambulation: 800 ft   POST:  Rate/Rhythm: 83 SR    BP: sitting 152/73     SaO2:   Tolerated well with slow pace (prob his norm). Denied CP. Ed completed. Encouraged increasing ex and tighter carb control. Pt now understands when to take NTG. Not interested in CRPII due to copay. [phone]   Elissa Lovett [place] CES, ACSM 12/30/2012 8:58 AM

## 2012-12-30 NOTE — Discharge Summary (Signed)
Physician Discharge Summary      Patient ID: Francis Hall MRN: [id number] DOB/AGE: 1956/01/02 57 y.o.  Admit date: 12/28/2012 Discharge date: 12/30/2012  Discharge Diagnoses:  Principal Problem:   NSTEMI (non-ST elevated myocardial infarction) Active Problems:   CAD (coronary artery disease), Hx CABG 2006, s/p PCI on long mid RCA lesionsand distal RCA-RPLA lesion with PTCA of the ostial-proximal RPDA 12/29/12    Diabetes mellitus with insulin therapy   Overweight   HTN (hypertension)   Hyperlipidemia   Discharged Condition: good  Procedures: 12/29/12:  Cardiac cath by Dr. Herbie Baltimore  12/29/12 Severe native CAD with known LAD & Circumflex occlusion, but newly noted mult-site severe disease of the mid and distal RCA with a bifurcation 1,1,1 lesion involving the RPLA & RPDA  Successful multi-site PCI of the long mid RCA lesion as well as the distal RCA-RPLA lesion with PTCA of the ostial-proximal RPDA. Widely patent LAD and free Radial to the OM.  Low normal LVEF with mild basal to mid Inferior hypokinesis.  Hospital Course:  57 y/o male, followed at [facility] by Dr. Royann Shivers. He was transferred directly from [facility] to [facility] for further management of NSTEMI. He has known CAD, s/p all-arterial CABG in 2006 (LIMA to LAD and free radial to OM). A nuclear stress test performed in 2011 demonstrated evidence of mild inferior wall ischemia. Cardiac catheterization performed at that time showed that both the LIMA to the LAD and the radial artery graft up to his marginal artery were widely patent but there was a 90% distal stenosis in the posterior descending artery, which was felt to be best managed with medical therapy. The left main coronary artery had a 40% stenosis. His other cardiac risk factors include DM, HTN and a family history of early CAD. He initially presented to [facility] with a complaint of "indigestion" and shortness of breath. He had an intiall episode 3 days ago that was short  lived and went away on its own. However, last night he developed midsternal chest tightness with mild SOB. It was worse with exertion. It did not radiate. He did not take anything at home for the discomfort. The pain did not improve with rest. At that time, he decided to go to the ER for evaluation. Work-up demonstrated a normal EKG, however he had a positive troponin of 1.14. He was started on IV nitro and transferred to [facility] for further management. He was CP free and denied SOB on arrival to [facility].   The night after admit he did develop mild chest discomfort. Increasing the IV NTG resolved his pain. EKG with nonspecific ST abnormalities.   He underwent cardiac cath and was found to have newly noted mult-site severe disease of the mid and distal RCA with a bifurcation 1,1,1 lesion involving the RPLA & RPDA.  Undergoing PCI.  Pt tolerated the procedure without complications.  The next AM he ambulated with cardiac rehab without pain.  He was seen and evaluated by Dr. Allyson Sabal and found to be stable for discharge home.  He is on Brilinta and ASA. Will follow up with Dr. Royann Shivers.  Pk troponin 1.72.  Consults: none  Significant Diagnostic Studies:  BMET    Component Value Date/Time   NA 140 12/30/2012 0400   K 3.8 12/30/2012 0400   CL 106 12/30/2012 0400   CO2 25 12/30/2012 0400   GLUCOSE 102* 12/30/2012 0400   BUN 8 12/30/2012 0400   CREATININE 0.77 12/30/2012 0400   CALCIUM 8.9 12/30/2012  0400   GFRNONAA >90 12/30/2012 0400   GFRAA >90 12/30/2012 0400    CBC    Component Value Date/Time   WBC 6.6 12/30/2012 0400   RBC 3.89* 12/30/2012 0400   HGB 11.6* 12/30/2012 0400   HCT 34.2* 12/30/2012 0400   PLT 168 12/30/2012 0400   MCV 87.9 12/30/2012 0400   MCH 29.8 12/30/2012 0400   MCHC 33.9 12/30/2012 0400   RDW 13.7 12/30/2012 0400   LYMPHSABS 1.7 12/28/2012 0514   MONOABS 0.3 12/28/2012 0514   EOSABS 0.2 12/28/2012 0514   BASOSABS 0.0 12/28/2012 0514    ProBNP  662  Troponin 1.72,  pk  hgb A1C  7.6 Platelet function P2Y12 258  CHEST - 2 VIEW Comparison: 11/14/2009 Findings: Stable appearance of postoperative changes in the mediastinum. The heart size and pulmonary vascularity are normal. The lungs appear clear and expanded without focal air space disease or consolidation. No blunting of the costophrenic angles. No pneumothorax. Mediastinal contours appear intact. Mild degenerative changes in the thoracic spine. No significant change since previous study.  IMPRESSION: No evidence of active pulmonary disease.    Discharge Exam: Blood pressure 141/61, pulse 64, temperature 98 F (36.7 C), temperature source Oral, resp. rate 18, height 5\' 8"  (1.727 m), weight 196 lb 3.4 oz (89 kg), SpO2 98.00%.  AM exam:  PE: Per Dr. Allyson Sabal  General:Pleasant affect, NAD  Skin:Warm and dry, brisk capillary refill  Heart:S1S2 RRR without murmur, gallup, rub or click  Lungs:clear without rales, rhonchi, or wheezes  WUJ:WJXB, non tender, + BS, do not palpate liver spleen or masses  Ext:no lower ext edema, 2+ pedal pulses, 2+ radial pulses  Neuro:alert and oriented, MAE, follows commands, + facial symmetry  Disposition: 01-Home or Self Care   Future Appointments Provider Department Dept Phone   01/12/2013 3:00 PM Thurmon Fair, MD [facility]  [phone]       Medication List    STOP taking these medications       clopidogrel 75 MG tablet  Commonly known as:  PLAVIX     meloxicam 7.5 MG tablet  Commonly known as:  MOBIC      TAKE these medications       acetaminophen 325 MG tablet  Commonly known as:  TYLENOL  Take 2 tablets (650 mg total) by mouth every 4 (four) hours as needed.     aspirin 81 MG EC tablet  Take 81 mg by mouth daily.     atorvastatin 40 MG tablet  Commonly known as:  LIPITOR  Take 20 mg by mouth daily.     carvedilol 25 MG tablet  Commonly known as:  COREG  Take 25 mg by mouth 2 (two) times daily with a meal.      diltiazem 120 MG 24 hr capsule  Commonly known as:  CARDIZEM CD  Take 1 capsule (120 mg total) by mouth daily.     furosemide 20 MG tablet  Commonly known as:  LASIX  Take 20 mg by mouth daily as needed.     hydrochlorothiazide 12.5 MG capsule  Commonly known as:  MICROZIDE  Take 12.5 mg by mouth daily.     insulin aspart 100 UNIT/ML injection  Commonly known as:  novoLOG  Inject 10 Units into the skin 3 (three) times daily before meals.     insulin glargine 100 UNIT/ML injection  Commonly known as:  LANTUS  Inject 60 Units into the skin at bedtime.  INVOKANA 100 MG Tabs  Generic drug:  Canagliflozin  Take 1 tablet by mouth daily.     lisinopril 40 MG tablet  Commonly known as:  PRINIVIL,ZESTRIL  Take 40 mg by mouth 2 (two) times daily.     metFORMIN 1000 MG tablet  Commonly known as:  GLUCOPHAGE  Take 1,000 mg by mouth 2 (two) times daily with a meal.     multivitamin capsule  Take 1 capsule by mouth daily.     mupirocin ointment 2 %  Commonly known as:  BACTROBAN  Apply 1 application topically 2 (two) times daily. Continue for 5 days total.     nitroGLYCERIN 0.4 MG SL tablet  Commonly known as:  NITROSTAT  Place 1 tablet (0.4 mg total) under the tongue every 5 (five) minutes x 3 doses as needed for chest pain.     omega-3 acid ethyl esters 1 G capsule  Commonly known as:  LOVAZA  Take 2 capsules (2 g total) by mouth 2 (two) times daily.     Ticagrelor 90 MG Tabs tablet  Commonly known as:  BRILINTA  Take 1 tablet (90 mg total) by mouth 2 (two) times daily.     vitamin C 500 MG tablet  Commonly known as:  ASCORBIC ACID  Take 500 mg by mouth daily.     Vitamin D3 1000 UNITS Caps  Take 2 capsules by mouth daily.           Follow-up Information   Follow up with Thurmon Fair, MD On 01/12/2013. (at 3:00PM )    Contact information:   [address] [place] [address] [phone]      Discharge Instructions: DO NOT take METFORMIN  until 01/01/13, Metformin may interact with cath dye.    Take 1 NTG, under your tongue, while sitting.  If no relief of pain may repeat NTG, one tab every 5 minutes up to 3 tablets total over 15 minutes.  If no relief CALL 911.  If you have dizziness/lightheadness  while taking NTG, stop taking and call 911.        No driving for 4 days, due to heart attack and cardiac cath.  Call [facility] if any bleeding, swelling or drainage at cath site.  May shower, no tub baths for 48 hours for groin sticks. No lifting over 10 pounds for 1 week.  Heart Healthy Diabetic diet.    Use tylenol instead of Mobic for arthritis pain.   Signed: Leone Brand Nurse Practitioner-Certified [facility] 12/30/2012, 10:21 AM  Time spent on discharge :45 minutes.

## 2012-12-30 NOTE — Progress Notes (Signed)
Subjective: No chest pain, no SOB  Objective: Vital signs in last 24 hours: Temp:  [97.8 F (36.6 C)-99.2 F (37.3 C)] 98 F (36.7 C) (07/29 0803) Pulse Rate:  [39-81] 64 (07/29 0803) Resp:  [14-21] 18 (07/29 0803) BP: (103-156)/(50-66) 141/61 mmHg (07/29 0803) SpO2:  [96 %-100 %] 98 % (07/29 0803) Weight:  [196 lb 3.4 oz (89 kg)] 196 lb 3.4 oz (89 kg) (07/29 0036) Weight change: 10.6 oz (0.3 kg) Last BM Date: 12/27/12 Intake/Output from previous day: -184 07/28 0701 - 07/29 0700 In: 1640.7 [P.O.:360; I.V.:1280.7] Out: 1925 [Urine:1925] Intake/Output this shift:    PE: Per Dr. Allyson Sabal General:Pleasant affect, NAD Skin:Warm and dry, brisk capillary refill Heart:S1S2 RRR without murmur, gallup, rub or click Lungs:clear without rales, rhonchi, or wheezes NWG:NFAO, non tender, + BS, do not palpate liver spleen or masses Ext:no lower ext edema, 2+ pedal pulses, 2+ radial pulses Neuro:alert and oriented, MAE, follows commands, + facial symmetry   Lab Results:  Recent Labs  12/29/12 0438 12/30/12 0400  WBC 6.7 6.6  HGB 11.8* 11.6*  HCT 33.4* 34.2*  PLT 187 168   BMET  Recent Labs  12/29/12 0438 12/30/12 0400  NA 138 140  K 3.9 3.8  CL 105 106  CO2 25 25  GLUCOSE 179* 102*  BUN 13 8  CREATININE 0.83 0.77  CALCIUM 8.4 8.9    Recent Labs  12/28/12 1725 12/28/12 2319  TROPONINI 1.66* 1.72*    Lab Results  Component Value Date   CHOL 175 12/29/2012   HDL 25* 12/29/2012   LDLCALC UNABLE TO CALCULATE IF TRIGLYCERIDE OVER 400 mg/dL 07/04/8655   TRIG 846* 9/62/9528   CHOLHDL 7.0 12/29/2012   Lab Results  Component Value Date   HGBA1C 7.6* 12/28/2012     No results found for this basename: TSH    Hepatic Function Panel  Recent Labs  12/28/12 0514  PROT 6.6  ALBUMIN 3.7  AST 34  ALT 34  ALKPHOS 121*  BILITOT 0.3    Recent Labs  12/29/12 0438  CHOL 175   No results found for this basename: PROTIME,  in the last 72 hours        Studies/Results: Cardiac cath and PCI 12/29/12: POST-OPERATIVE DIAGNOSIS:  Severe native CAD with known LAD & Circumflex occlusion, but newly noted mult-site severe disease of the mid and distal RCA with a bifurcation 1,1,1 lesion involving the RPLA & RPDA  Successful multi-site PCI of the long mid RCA lesion as well as the distal RCA-RPLA lesion with PTCA of the ostial-proximal RPDA. Widely patent LAD and free Radial to the OM.  Low normal LVEF with mild basal to mid Inferior hypokinesis.  EF 50-55%    Medications: I have reviewed the patient's current medications. Scheduled Meds: . aspirin EC  81 mg Oral Daily  . atorvastatin  20 mg Oral q1800  . carvedilol  25 mg Oral BID WC  . Chlorhexidine Gluconate Cloth  6 each Topical Q0600  . diltiazem  120 mg Oral Daily  . insulin aspart  0-13 Units Subcutaneous TID AC  . insulin glargine  60 Units Subcutaneous QHS  . lisinopril  40 mg Oral BID  . mupirocin ointment  1 application Nasal BID  . omega-3 acid ethyl esters  2 g Oral BID  . sodium chloride  3 mL Intravenous Q12H  . Ticagrelor  90 mg Oral BID   Continuous Infusions: . bivalirudin (ANGIOMAX) infusion 5 mg/mL (Cath Lab,ACS,PCI indication)    .  nitroGLYCERIN 40 mcg/min (12/29/12 0800)   PRN Meds:.sodium chloride, acetaminophen, morphine injection, nitroGLYCERIN, ondansetron (ZOFRAN) IV, sodium chloride  Assessment/Plan: Principal Problem:   NSTEMI (non-ST elevated myocardial infarction) Active Problems:   CAD (coronary artery disease), Hx CABG 2006, s/p PCI on long mid RCA lesionsand distal RCA-RPLA lesion with PTCA of the ostial-proximal RPDA 12/29/12    Diabetes mellitus with insulin therapy   Overweight   HTN (hypertension)   Hyperlipidemia  PLAN:  Ok for discharge.  Will follow up with Dr. Royann Shivers. Will make sure he can afford Brilinta.  Dr. Allyson Sabal has seen and examined.   LOS: 2 days    [facility] R  Nurse Practitioner Certified Pager [phone] 12/30/2012, 9:58  AM   Agree with note written by Nada Boozer RNP  S/P RCA PCI/stent with DES. Small NSTEMI. No CP/SOB. Exam benign. Labs OK. Plan D/C home on ASA and Brilenta. ROV with Dr. Julieanne Cotton J 12/30/2012 11:19 AM

## 2012-12-30 NOTE — Progress Notes (Signed)
Pt did not have brilinta ordered for tonight.  Received 180 mg brilinta in cath lab earlier today.  Order is for 90mg  bid to start tom 7/29.  Dr. Shirlee Latch notified of above and order received for brilinta 90mg  tonight.

## 2013-01-12 ENCOUNTER — Ambulatory Visit (INDEPENDENT_AMBULATORY_CARE_PROVIDER_SITE_OTHER): Payer: BC Managed Care – PPO | Admitting: Cardiovascular Disease

## 2013-01-12 ENCOUNTER — Encounter: Payer: Self-pay | Admitting: Cardiovascular Disease

## 2013-01-12 VITALS — BP 142/66 | HR 67 | Ht 68.0 in | Wt 193.7 lb

## 2013-01-12 DIAGNOSIS — Z794 Long term (current) use of insulin: Secondary | ICD-10-CM

## 2013-01-12 DIAGNOSIS — E119 Type 2 diabetes mellitus without complications: Secondary | ICD-10-CM

## 2013-01-12 DIAGNOSIS — I2510 Atherosclerotic heart disease of native coronary artery without angina pectoris: Secondary | ICD-10-CM

## 2013-01-12 DIAGNOSIS — E785 Hyperlipidemia, unspecified: Secondary | ICD-10-CM

## 2013-01-12 DIAGNOSIS — E663 Overweight: Secondary | ICD-10-CM

## 2013-01-12 MED ORDER — FENOFIBRATE 48 MG PO TABS: 48.0000 mg | ORAL_TABLET | Freq: Every day | ORAL | Status: DC

## 2013-01-12 MED ORDER — HYDROCHLOROTHIAZIDE 12.5 MG PO CAPS: 12.5000 mg | ORAL_CAPSULE | Freq: Every day | ORAL | Status: DC | PRN

## 2013-01-12 NOTE — Patient Instructions (Addendum)
Your physician recommends that you return for lab work in: 3 months before your next appointment.  Go to the lab before eating or drinking anything other than water with your medications.  Your physician recommends that you schedule a follow-up appointment in: 3 months

## 2013-01-14 NOTE — Assessment & Plan Note (Signed)
Discussed low carbohydrate, low glycemic index starch diet and weight loss and exercise. Target A1c less than 7%.

## 2013-01-14 NOTE — Assessment & Plan Note (Signed)
Add fenofibrate to statin. Push for more aggressive glycemic control with a hemoglobin A1c target of under 7%. Recheck metabolic parameters in 3 months

## 2013-01-14 NOTE — Assessment & Plan Note (Signed)
Persistent efforts at weight loss to a target BMI of less than 25 and a waistline of under 34 inches were discussed. Reviewed a low carbohydrate, low glycemic index starch, high protein high unsaturated fat diet to achieve this. Cardiac rehabilitation leading to lifelong sustained aerobic exercise were discussed.

## 2013-01-14 NOTE — Assessment & Plan Note (Signed)
Now asymptomatic. Critical importance of uninterrupted dual antiplatelet therapy for one year was discussed. The focus should now be on aggressive risk factor modification to prevent disease progression.

## 2013-01-14 NOTE — Progress Notes (Signed)
Patient ID: Francis Hall, male   DOB: 12/03/1955, 57 y.o.   MRN: [id number]     Reason for office visit Followup after recent non-STEMI and PCI  Recent presentation with unstable angina and laboratory evidence of a small NSTEMI. He has known CAD, s/p all-arterial CABG in 2006 (LIMA to LAD and free radial to OM). He initially presented to [facility] with a complaint of "indigestion" and shortness of breath. He  developed midsternal chest tightness with mild SOB. It was worse with exertion. It did not radiate. He did not take anything at home for the discomfort. The pain did not improve with rest. At that time, he decided to go to the ER for evaluation. Work-up demonstrated a normal EKG, however he had a positive troponin of 1.14. He was started on IV nitro and transferred to [facility] for further management. He was CP free and denied SOB on arrival to [facility]. The night after admit he did develop mild chest discomfort. Increasing the IV NTG resolved his pain. EKG with nonspecific ST abnormalities. Pk troponin 1.72. He underwent cardiac cath and was found to have newly noted mult-site severe disease of the mid and distal RCA with a bifurcation lesion involving the RPLA & RPDA.   PTCA OF DISTAL RCA INTO PROXIMAL RPL (PROMUS PREMIER DES 2.25 MM X 16 MM - POST DILATED TO 2.5 MM)  PTCA OF OSTIAL RPDA USING 2.5 MM BALLOON  PCI OF LONG MID RCA LESION - PROMUS PREMIER DES 2.5 MM X 38 MM (OST-DILATED TO 3.0 MM)  Pt tolerated the procedure without complications. The next AM he ambulated with cardiac rehab without pain. No recurrence of chest pain since. He is on Brilinta and ASA.       Allergies  Allergen Reactions  . Crestor [Rosuvastatin] Other (See Comments)    myalgia    Current Outpatient Prescriptions  Medication Sig Dispense Refill  . acetaminophen (TYLENOL) 325 MG tablet Take 2 tablets (650 mg total) by mouth every 4 (four) hours as needed.      [name] aspirin 81 MG EC tablet Take 81 mg by mouth daily.         [name] atorvastatin (LIPITOR) 40 MG tablet Take 20 mg by mouth daily.       . Canagliflozin (INVOKANA) 100 MG TABS Take 1 tablet by mouth daily.       . carvedilol (COREG) 25 MG tablet Take 25 mg by mouth 2 (two) times daily with a meal.        . Cholecalciferol (VITAMIN D3) 1000 UNITS CAPS Take 2 capsules by mouth daily.      [name] diltiazem (CARDIZEM CD) 120 MG 24 hr capsule Take 1 capsule (120 mg total) by mouth daily.  30 capsule  1  . insulin aspart (NOVOLOG) 100 UNIT/ML injection Inject 10-13 Units into the skin 3 (three) times daily before meals. Sliding scale      . insulin glargine (LANTUS) 100 UNIT/ML injection Inject 60 Units into the skin at bedtime.       [name] lisinopril (PRINIVIL,ZESTRIL) 40 MG tablet Take 40 mg by mouth 2 (two) times daily.       . metFORMIN (GLUCOPHAGE) 1000 MG tablet Take 1,000 mg by mouth 2 (two) times daily with a meal.      . Multiple Vitamin (MULTIVITAMIN) capsule Take 1 capsule by mouth daily.        . nitroGLYCERIN (NITROSTAT) 0.4 MG SL tablet Place 1 tablet (0.4 mg total) under the tongue  every 5 (five) minutes x 3 doses as needed for chest pain.  25 tablet  0  . omega-3 acid ethyl esters (LOVAZA) 1 G capsule Take 2 capsules (2 g total) by mouth 2 (two) times daily.  360 capsule  3  . Ticagrelor (BRILINTA) 90 MG TABS tablet Take 1 tablet (90 mg total) by mouth 2 (two) times daily.  60 tablet  11  . vitamin C (ASCORBIC ACID) 500 MG tablet Take 500 mg by mouth daily.      . fenofibrate (TRICOR) 48 MG tablet Take 1 tablet (48 mg total) by mouth daily.  30 tablet  3  . hydrochlorothiazide (MICROZIDE) 12.5 MG capsule Take 1 capsule (12.5 mg total) by mouth daily as needed.  30 capsule  3   No current facility-administered medications for this visit.    Past Medical History  Diagnosis Date  . Hyperplastic colon polyp 07/16/07    30cm   . Diabetes mellitus     insulin  . CAD (coronary artery disease)     [facility]  . Hyperlipemia   . HTN (hypertension)   .  Family hx of colon cancer   . Diverticulosis   . Atrial fibrillation   . CHF (congestive heart failure) 11/08/2010    2D Echo EF=>55%  . Dyspnea   . OSA (obstructive sleep apnea) 12/21/2007    sleep study perform REM REM and NREM was 95.0%, AHI of 12.31hr and RDI of 12.4hr    Past Surgical History  Procedure Laterality Date  . Coronary artery bypass graft  06/2004    x2 performed for 40% left main stenosis, 80% proximal LAD stenosis of the first diagonal artery, and 50% left circumfles artery priximal stenosis.LIMA to LAD and free radial artery to circumflex coronary artery  . Tonsillectomy  1962  . Colonoscopy  07/2007    Dr Lovell Sheehan: hyperplastic polyp 30cm  . Colonoscopy N/A 08/12/2012    Procedure: COLONOSCOPY;  Surgeon: West Bali, MD;  [facility]: [facility];  Service: Endoscopy;  Laterality: N/A;  8:30  . Cardiac catheterization  2006 2011    Family History  Problem Relation Age of Onset  . Colon cancer Father     58's?  . Lung cancer Father   . Arthritis    . Heart disease    . Diabetes    . Coronary artery disease Father 67    MI    History   Social History  . Marital Status: Married    Spouse Name: N/A    Number of Children: 2  . Years of Education: 12   Occupational History  . disabled Curator     Social History Main Topics  . Smoking status: Never Smoker   . Smokeless tobacco: Never Used  . Alcohol Use: Yes     Comment: social-2 beers/wk  . Drug Use: No  . Sexual Activity: Not on file   Other Topics Concern  . Not on file   Social History Narrative  . No narrative on file    Review of systems: The patient specifically denies any chest pain at rest or with exertion, dyspnea at rest or with exertion, orthopnea, paroxysmal nocturnal dyspnea, syncope, palpitations, focal neurological deficits, intermittent claudication, lower extremity edema, unexplained weight gain, cough, hemoptysis or wheezing.  The patient also denies abdominal pain,  nausea, vomiting, dysphagia, diarrhea, constipation, polyuria, polydipsia, dysuria, hematuria, frequency, urgency, abnormal bleeding or bruising, fever, chills, unexpected weight changes, mood swings, change in skin or hair  texture, change in voice quality, auditory or visual problems, allergic reactions or rashes, new musculoskeletal complaints other than usual "aches and pains".  No complications at groin access site  PHYSICAL EXAM BP 142/66  Pulse 67  Ht 5\' 8"  (1.727 m)  Wt 193 lb 11.2 oz (87.862 kg)  BMI 29.46 kg/m2  General: Alert, oriented x3, no distress Head: no evidence of trauma, PERRL, EOMI, no exophtalmos or lid lag, no myxedema, no xanthelasma; normal ears, nose and oropharynx Neck: normal jugular venous pulsations and no hepatojugular reflux; brisk carotid pulses without delay and no carotid bruits Chest: clear to auscultation, no signs of consolidation by percussion or palpation, normal fremitus, symmetrical and full respiratory excursions Cardiovascular: normal position and quality of the apical impulse, regular rhythm, normal first and second heart sounds, no murmurs, rubs or gallops Abdomen: no tenderness or distention, no masses by palpation, no abnormal pulsatility or arterial bruits, normal bowel sounds, no hepatosplenomegaly Extremities: no clubbing, cyanosis or edema; 2+ radial, ulnar and brachial pulses bilaterally; 2+ right femoral, posterior tibial and dorsalis pedis pulses; 2+ left femoral, posterior tibial and dorsalis pedis pulses; no subclavian or femoral bruits Neurological: grossly nonfocal   EKG: Normal sinus rhythm, T-wave inversion in leads 69F and less prominently in V5 and V6 are new when compared with tracings from 2013 but similar to those during his hospitalization  Lipid Panel     Component Value Date/Time   CHOL 175 12/29/2012 0438   TRIG 574* 12/29/2012 0438   HDL 25* 12/29/2012 0438   CHOLHDL 7.0 12/29/2012 0438   VLDL UNABLE TO CALCULATE IF  TRIGLYCERIDE OVER 400 mg/dL 11/22/3084 5784   LDLCALC UNABLE TO CALCULATE IF TRIGLYCERIDE OVER 400 mg/dL 6/96/2952 8413    BMET    Component Value Date/Time   NA 140 12/30/2012 0400   K 3.8 12/30/2012 0400   CL 106 12/30/2012 0400   CO2 25 12/30/2012 0400   GLUCOSE 102* 12/30/2012 0400   BUN 8 12/30/2012 0400   CREATININE 0.77 12/30/2012 0400   CALCIUM 8.9 12/30/2012 0400   GFRNONAA >90 12/30/2012 0400   GFRAA >90 12/30/2012 0400     ASSESSMENT AND PLAN CAD (coronary artery disease), Hx CABG 2006, s/p PCI on long mid RCA lesionsand distal RCA-RPLA lesion with PTCA of the ostial-proximal RPDA 12/29/12  Now asymptomatic. Critical importance of uninterrupted dual antiplatelet therapy for one year was discussed. The focus should now be on aggressive risk factor modification to prevent disease progression.  Hyperlipidemia Add fenofibrate to statin. Push for more aggressive glycemic control with a hemoglobin A1c target of under 7%. Recheck metabolic parameters in 3 months  Overweight Persistent efforts at weight loss to a target BMI of less than 25 and a waistline of under 34 inches were discussed. Reviewed a low carbohydrate, low glycemic index starch, high protein high unsaturated fat diet to achieve this. Cardiac rehabilitation leading to lifelong sustained aerobic exercise were discussed.  Diabetes mellitus with insulin therapy Discussed low carbohydrate, low glycemic index starch diet and weight loss and exercise. Target A1c less than 7%.  Orders Placed This Encounter  Procedures  . Lipid Profile  . Comp Met (CMET)  . EKG 12-Lead   Meds ordered this encounter  Medications  . fenofibrate (TRICOR) 48 MG tablet    Sig: Take 1 tablet (48 mg total) by mouth daily.    Dispense:  30 tablet    Refill:  3    Fritzi Scripter  Thurmon Fair, MD, [facility] [phone] office [phone] pager

## 2013-01-21 ENCOUNTER — Telehealth: Payer: Self-pay | Admitting: Cardiovascular Disease

## 2013-01-21 NOTE — Telephone Encounter (Signed)
Pt's wife was calling regarding his Brilinta Rx not being called into [facility] for a refill for up to a year plus a refill at [facility].  [facility] fax number [phone].

## 2013-01-22 ENCOUNTER — Other Ambulatory Visit: Payer: Self-pay | Admitting: Cardiovascular Disease

## 2013-01-22 MED ORDER — TICAGRELOR 90 MG PO TABS: 90.0000 mg | ORAL_TABLET | Freq: Two times a day (BID) | ORAL | Status: DC

## 2013-01-22 NOTE — Telephone Encounter (Signed)
Returned call.  Left message to call back tomorrow before 4pm and page RN when calling.

## 2013-01-22 NOTE — Telephone Encounter (Signed)
Wife returning your call.

## 2013-01-22 NOTE — Telephone Encounter (Signed)
Refill request received for Brilinta.  Refill(s) sent to pharmacy.  Also sent 90-day refill to [facility].

## 2013-01-22 NOTE — Telephone Encounter (Signed)
Returned call.  Pt stated his wife called about his refills on his meds.  Stated she handles it and he isn't sure what he needs, but knows he needs Brilinta.  Pt informed Rxs can be sent, but RN needs to know which meds he needs.  Pt gave cell number for wife, Darl Pikes.  Call to wife and left message to call back today before 4pm and page RN.

## 2013-01-23 ENCOUNTER — Telehealth: Payer: Self-pay | Admitting: Cardiovascular Disease

## 2013-01-23 NOTE — Telephone Encounter (Signed)
Returning your call about refills

## 2013-01-23 NOTE — Telephone Encounter (Signed)
Refill request received and completed.

## 2013-01-23 NOTE — Telephone Encounter (Signed)
Returned call and left message to call back Monday before 4pm and that refills were sent.

## 2013-02-09 ENCOUNTER — Telehealth: Payer: Self-pay | Admitting: Cardiovascular Disease

## 2013-02-09 NOTE — Telephone Encounter (Signed)
Pt was calling you back in regards to the Rx that was supposed to be sent to [facility].

## 2013-02-09 NOTE — Telephone Encounter (Signed)
Returned call.  Left message to call back tomorrow before 4pm.  

## 2013-02-10 MED ORDER — TICAGRELOR 90 MG PO TABS: 90.0000 mg | ORAL_TABLET | Freq: Two times a day (BID) | ORAL | Status: DC

## 2013-02-10 MED ORDER — DILTIAZEM HCL ER COATED BEADS 120 MG PO CP24: 120.0000 mg | ORAL_CAPSULE | Freq: Every day | ORAL | Status: DC

## 2013-02-10 NOTE — Telephone Encounter (Signed)
Returned call to [place], pt's mother.  Stated she did not call.  Call to pt.  Stated his wife called yesterday and left him information to give.  Pt stated he never received diltiazem or Brilinta.  Informed Brilinta was sent to [facility] as requested and diltiazem was printed at discharge from hospital.  Informed RN will resend both Rxs and f/u call to confirm received.  Pt verbalized understanding and agreed w/ plan.  Refill(s) sent to pharmacy.  Call to [facility] and informed Rxs not received [employer]) and that pt has a balance so Rxs will not be mailed until paid.  Transferred to pharmacy and spoke w/ Dorene Sorrow, pharmacist.  Informed Brilinta Rx from 8.21.14 received.  RN informed both sent today, but will do verbal order.  Dorene Sorrow received Rx for diltiazem and will prepare to fill.  Stated Brilinta to be filled tomorrow.   Call to pt and informed.  Pt verbalized understanding and agreed w/ plan.

## 2013-02-22 ENCOUNTER — Other Ambulatory Visit: Payer: Self-pay | Admitting: Cardiovascular Disease

## 2013-02-23 ENCOUNTER — Other Ambulatory Visit: Payer: Self-pay | Admitting: Cardiovascular Disease

## 2013-02-23 NOTE — Telephone Encounter (Signed)
Rx was sent to pharmacy electronically. 

## 2013-04-14 ENCOUNTER — Ambulatory Visit (INDEPENDENT_AMBULATORY_CARE_PROVIDER_SITE_OTHER): Payer: BC Managed Care – PPO | Admitting: Cardiovascular Disease

## 2013-04-14 ENCOUNTER — Encounter: Payer: Self-pay | Admitting: Cardiovascular Disease

## 2013-04-14 VITALS — BP 128/80 | HR 64 | Ht 68.0 in | Wt 191.7 lb

## 2013-04-14 DIAGNOSIS — I10 Essential (primary) hypertension: Secondary | ICD-10-CM

## 2013-04-14 DIAGNOSIS — E785 Hyperlipidemia, unspecified: Secondary | ICD-10-CM

## 2013-04-14 DIAGNOSIS — I2510 Atherosclerotic heart disease of native coronary artery without angina pectoris: Secondary | ICD-10-CM

## 2013-04-14 NOTE — Patient Instructions (Signed)
Your physician recommends that you schedule a follow-up appointment in: 9 months  

## 2013-04-19 NOTE — Progress Notes (Signed)
Patient ID: ZAC TORTI, male   DOB: [birth year], 57 y.o.   MRN: [id number]     Reason for office visit CAD status post recent PCI  Francis Hall underwent bypass surgery in 2006 when he was only 57 years old and has subsequently required repeated percutaneous interventions  placement of 2 drug-eluting stents (proximal posterolateral artery 2.25 x 16 mm Promus and mid right coronary artery 2.5 x 38 mm Promus) after a small non-ST segment elevation myocardial infarction July. He is currently free of angina and denies any dyspnea with activity. His only cardiac complaint is slight dizziness if he gets out of the car after driving a long distance.   Allergies  Allergen Reactions  . Crestor [Rosuvastatin] Other (See Comments)    myalgia    Current Outpatient Prescriptions  Medication Sig Dispense Refill  . acetaminophen (TYLENOL) 325 MG tablet Take 2 tablets (650 mg total) by mouth every 4 (four) hours as needed.      [name] aspirin 81 MG EC tablet Take 81 mg by mouth daily.        [name] atorvastatin (LIPITOR) 20 MG tablet Take 20 mg by mouth at bedtime.      . Canagliflozin (INVOKANA) 100 MG TABS Take 1 tablet by mouth daily.       . carvedilol (COREG) 25 MG tablet Take 25 mg by mouth 2 (two) times daily with a meal.        . Cholecalciferol (VITAMIN D3) 1000 UNITS CAPS Take 2 capsules by mouth daily.      [name] diltiazem (CARDIZEM CD) 120 MG 24 hr capsule TAKE ONE CAPSULE BY MOUTH EVERY DAY  30 capsule  11  . fenofibrate (TRICOR) 48 MG tablet Take 1 tablet (48 mg total) by mouth daily.  30 tablet  3  . hydrochlorothiazide (MICROZIDE) 12.5 MG capsule Take 1 capsule (12.5 mg total) by mouth daily as needed.  30 capsule  3  . insulin aspart (NOVOLOG) 100 UNIT/ML injection Inject 10-13 Units into the skin 3 (three) times daily before meals. Sliding scale      . insulin glargine (LANTUS) 100 UNIT/ML injection Inject 60 Units into the skin at bedtime.       [name] lisinopril (PRINIVIL,ZESTRIL) 40 MG tablet Take 40 mg by  mouth 2 (two) times daily.       . metFORMIN (GLUCOPHAGE) 1000 MG tablet Take 1,000 mg by mouth 2 (two) times daily with a meal.      . Multiple Vitamin (MULTIVITAMIN) capsule Take 1 capsule by mouth daily.        [name] NITROSTAT 0.4 MG SL tablet PLACE 1 TABLET UNDER TONGUE IF NEEDED FOR CHEST PAIN.Jake Seats OF 3 DOSES  25 tablet  5  . omega-3 acid ethyl esters (LOVAZA) 1 G capsule Take 2 capsules (2 g total) by mouth 2 (two) times daily.  360 capsule  3  . Ticagrelor (BRILINTA) 90 MG TABS tablet Take 1 tablet (90 mg total) by mouth 2 (two) times daily.  180 tablet  3  . vitamin C (ASCORBIC ACID) 500 MG tablet Take 500 mg by mouth daily.       No current facility-administered medications for this visit.    Past Medical History  Diagnosis Date  . Hyperplastic colon polyp 07/16/07    30cm   . Diabetes mellitus     insulin  . CAD (coronary artery disease)     [facility]  . Hyperlipemia   . HTN (hypertension)   . Family hx of  colon cancer   . Diverticulosis   . Atrial fibrillation   . CHF (congestive heart failure) 11/08/2010    2D Echo EF=>55%  . Dyspnea   . OSA (obstructive sleep apnea) 12/21/2007    sleep study perform REM REM and NREM was 95.0%, AHI of 12.31hr and RDI of 12.4hr    Past Surgical History  Procedure Laterality Date  . Coronary artery bypass graft  06/2004    x2 performed for 40% left main stenosis, 80% proximal LAD stenosis of the first diagonal artery, and 50% left circumfles artery priximal stenosis.LIMA to LAD and free radial artery to circumflex coronary artery  . Tonsillectomy  1962  . Colonoscopy  07/2007    Dr Lovell Sheehan: hyperplastic polyp 30cm  . Colonoscopy N/A 08/12/2012    Procedure: COLONOSCOPY;  Surgeon: West Bali, MD;  Location: [facility];  Service: Endoscopy;  Laterality: N/A;  8:30  . Cardiac catheterization  2006 2011    Family History  Problem Relation Age of Onset  . Colon cancer Father     33's?  . Lung cancer Father   . Arthritis     . Heart disease    . Diabetes    . Coronary artery disease Father 86    MI    History   Social History  . Marital Status: Married    Spouse Name: N/A    Number of Children: 2  . Years of Education: 12   Occupational History  . disabled Curator     Social History Main Topics  . Smoking status: Never Smoker   . Smokeless tobacco: Never Used  . Alcohol Use: Yes     Comment: social-2 beers/wk  . Drug Use: No  . Sexual Activity: Not on file   Other Topics Concern  . Not on file   Social History Narrative  . No narrative on file    Review of systems: The patient specifically denies any chest pain at rest or with exertion, dyspnea at rest or with exertion, orthopnea, paroxysmal nocturnal dyspnea, syncope, palpitations, focal neurological deficits, intermittent claudication, lower extremity edema, unexplained weight gain, cough, hemoptysis or wheezing.  The patient also denies abdominal pain, nausea, vomiting, dysphagia, diarrhea, constipation, polyuria, polydipsia, dysuria, hematuria, frequency, urgency, abnormal bleeding or bruising, fever, chills, unexpected weight changes, mood swings, change in skin or hair texture, change in voice quality, auditory or visual problems, allergic reactions or rashes, new musculoskeletal complaints other than usual "aches and pains".   PHYSICAL EXAM BP 128/80  Pulse 64  Ht 5\' 8"  (1.727 m)  Wt 191 lb 11.2 oz (86.955 kg)  BMI 29.15 kg/m2  General: Alert, oriented x3, no distress Head: no evidence of trauma, PERRL, EOMI, no exophtalmos or lid lag, no myxedema, no xanthelasma; normal ears, nose and oropharynx Neck: normal jugular venous pulsations and no hepatojugular reflux; brisk carotid pulses without delay and no carotid bruits Chest: clear to auscultation, no signs of consolidation by percussion or palpation, normal fremitus, symmetrical and full respiratory excursions, sternotomy scar Cardiovascular: normal position and quality of the  apical impulse, regular rhythm, normal first and second heart sounds, no murmurs, rubs or gallops Abdomen: no tenderness or distention, no masses by palpation, no abnormal pulsatility or arterial bruits, normal bowel sounds, no hepatosplenomegaly Extremities: no clubbing, cyanosis or edema; 2+ulnar and brachial pulses bilaterally, 2+ right radial artery pulse, left radial artery harvested; 2+ right femoral, posterior tibial and dorsalis pedis pulses; 2+ left femoral, posterior tibial and dorsalis pedis  pulses; no subclavian or femoral bruits Neurological: grossly nonfocal  Lipid Panel     Component Value Date/Time   CHOL 175 12/29/2012 0438   TRIG 574* 12/29/2012 0438   HDL 25* 12/29/2012 0438   CHOLHDL 7.0 12/29/2012 0438   VLDL UNABLE TO CALCULATE IF TRIGLYCERIDE OVER 400 mg/dL 1/61/0960 4540   LDLCALC UNABLE TO CALCULATE IF TRIGLYCERIDE OVER 400 mg/dL 9/81/1914 7829   Hemoglobin A1c July 27   7.6% BMET    Component Value Date/Time   NA 140 12/30/2012 0400   K 3.8 12/30/2012 0400   CL 106 12/30/2012 0400   CO2 25 12/30/2012 0400   GLUCOSE 102* 12/30/2012 0400   BUN 8 12/30/2012 0400   CREATININE 0.77 12/30/2012 0400   CALCIUM 8.9 12/30/2012 0400   GFRNONAA >90 12/30/2012 0400   GFRAA >90 12/30/2012 0400     ASSESSMENT AND PLAN Hyperlipidemia He is taking statin and fenofibrate - the most important avenue to improve his hypertriglyceridemia and low HDL cholesterol his weight loss, regular physical exercise, diet low in carbohydrates and saturated fats and high in protein and unsaturated fat.  HTN (hypertension) Control, no changes in medicines  CAD (coronary artery disease), Hx CABG 2006, s/p PCI on long mid RCA lesionsand distal RCA-RPLA lesion with PTCA of the ostial-proximal RPDA 12/29/12  Asymptomatic. Focus on risk factor modification. Considerable risk of recurrent problems due to his stent restenosis because of the relatively small size of the stents the long 38 mm stent in the mid  RCA and his diabetes. Discussed symptoms of recurrent angina with in-stent restenosis, typically occurring 6-18 months after the initial procedure.   Patient Instructions  Your physician recommends that you schedule a follow-up appointment in: 9 months.       Meds ordered this encounter  Medications  . atorvastatin (LIPITOR) 20 MG tablet    Sig: Take 20 mg by mouth at bedtime.    Junious Silk, MD, [facility] [phone] office [phone] pager

## 2013-04-19 NOTE — Assessment & Plan Note (Signed)
Asymptomatic. Focus on risk factor modification. Considerable risk of recurrent problems due to his stent restenosis because of the relatively small size of the stents the long 38 mm stent in the mid RCA and his diabetes. Discussed symptoms of recurrent angina with in-stent restenosis, typically occurring 6-18 months after the initial procedure.

## 2013-04-19 NOTE — Assessment & Plan Note (Signed)
He is taking statin and fenofibrate - the most important avenue to improve his hypertriglyceridemia and low HDL cholesterol his weight loss, regular physical exercise, diet low in carbohydrates and saturated fats and high in protein and unsaturated fat.

## 2013-04-19 NOTE — Assessment & Plan Note (Signed)
Control, no changes in medicines

## 2013-07-08 ENCOUNTER — Encounter: Payer: Self-pay | Admitting: Cardiovascular Disease

## 2013-09-07 ENCOUNTER — Other Ambulatory Visit: Payer: Self-pay | Admitting: *Deleted

## 2013-09-07 MED ORDER — METFORMIN HCL 1000 MG PO TABS: 1000.0000 mg | ORAL_TABLET | Freq: Two times a day (BID) | ORAL | Status: DC

## 2013-09-07 NOTE — Telephone Encounter (Signed)
Rx refill sent to patients pharmacy  

## 2013-09-09 ENCOUNTER — Other Ambulatory Visit: Payer: Self-pay | Admitting: *Deleted

## 2013-09-09 NOTE — Telephone Encounter (Signed)
Rx refill was filled 2 days ago.

## 2013-10-05 ENCOUNTER — Telehealth: Payer: Self-pay | Admitting: Cardiovascular Disease

## 2013-10-05 NOTE — Telephone Encounter (Signed)
States he needs disability paper filled out.  Per our chart here Dr. Loletha Grayer does not have him out on disability.  Patient states Dr. Hilma Favors has him out of disability.  One of his ins. Companies has quit paying for his car and needs the paperwork done.  Suggested he talk w/Dr. Hilma Favors about this.  Will make an appt with Dr. Loletha Grayer in July to discuss.  Patient voiced understanding.

## 2013-10-05 NOTE — Telephone Encounter (Signed)
Want to speak to someone about his disability papers  .[name] Please call   Thanks

## 2013-10-05 NOTE — Telephone Encounter (Signed)
Forwarded to [place], [place].

## 2013-10-30 ENCOUNTER — Encounter: Payer: Self-pay | Admitting: Cardiovascular Disease

## 2013-11-24 ENCOUNTER — Encounter (HOSPITAL_COMMUNITY): Payer: Self-pay | Admitting: Pharmacy Technician

## 2013-11-27 ENCOUNTER — Encounter (HOSPITAL_COMMUNITY)
Admission: RE | Admit: 2013-11-27 | Discharge: 2013-11-27 | Disposition: A | Discharge status: Home / Self Care | Payer: BC Managed Care – PPO | Source: Ambulatory Visit | Attending: Ophthalmology | Admitting: Ophthalmology

## 2013-11-27 ENCOUNTER — Encounter (HOSPITAL_COMMUNITY): Payer: Self-pay

## 2013-11-27 DIAGNOSIS — Z01818 Encounter for other preprocedural examination: Secondary | ICD-10-CM | POA: Insufficient documentation

## 2013-11-27 DIAGNOSIS — Z01812 Encounter for preprocedural laboratory examination: Secondary | ICD-10-CM | POA: Insufficient documentation

## 2013-11-27 HISTORY — DX: Acute myocardial infarction, unspecified: I21.9

## 2013-11-27 LAB — BASIC METABOLIC PANEL
BUN: 21 mg/dL (ref 6–23)
CHLORIDE: 103 meq/L (ref 96–112)
CO2: 22 mEq/L (ref 19–32)
Calcium: 9.5 mg/dL (ref 8.4–10.5)
Creatinine, Ser: 0.87 mg/dL (ref 0.50–1.35)
GFR calc non Af Amer: >90 mL/min (ref >90)
Glucose, Bld: 218 mg/dL — ABNORMAL HIGH (ref 70–99)
Potassium: 4.5 mEq/L (ref 3.7–5.3)
SODIUM: 139 meq/L (ref 137–147)

## 2013-11-27 LAB — HEMOGLOBIN AND HEMATOCRIT, BLOOD
HCT: 36.1 % — ABNORMAL LOW (ref 39.0–52.0)
HEMOGLOBIN: 12.6 g/dL — AB (ref 13.0–17.0)

## 2013-11-27 NOTE — Pre-Procedure Instructions (Signed)
Patient given information to sign up for my chart at home. 

## 2013-11-27 NOTE — Patient Instructions (Signed)
Your procedure is scheduled on:  12/03/13  Report to [facility] at 11:00 AM.  Call this number if you have problems the morning of surgery: [phone]   Remember:   Do not eat food or drink liquids after midnight.   Take these medicines the morning of surgery with A SIP OF WATER: Carvedilol, Diltiazem, Lisinopril and Hydrochlorothiazide. Take only half of your dose of Lantus the night before your procedure.   Do not wear jewelry, make-up or nail polish.  Do not wear lotions, powders, or perfumes. You may wear deodorant.  Do not bring valuables to the hospital.  [facility] is not responsible for any belongings or valuables.               Contacts, dentures or bridgework may not be worn into surgery.               Patients discharged the day of surgery will not be allowed to drive home.   Special Instructions: Start using your eye drops prior to surgery as directed by your eye doctor.   Please read over the following fact sheets that you were given: Anesthesia Post-op Instructions and Care and Recovery After Surgery     Cataract Surgery  A cataract is a clouding of the lens of the eye. When a lens becomes cloudy, vision is reduced based on the degree and nature of the clouding. Surgery may be needed to improve vision. Surgery removes the cloudy lens and usually replaces it with a substitute lens (intraocular lens, IOL). LET YOUR EYE DOCTOR KNOW ABOUT:  Allergies to food or medicine.  Medicines taken including herbs, eyedrops, over-the-counter medicines, and creams.  Use of steroids (by mouth or creams).  Previous problems with anesthetics or numbing medicine.  History of bleeding problems or blood clots.  Previous surgery.  Other health problems, including diabetes and kidney problems.  Possibility of pregnancy, if this applies. RISKS AND COMPLICATIONS  Infection.  Inflammation of the eyeball (endophthalmitis) that can spread to both eyes (sympathetic ophthalmia).  Poor  wound healing.  If an IOL is inserted, it can later fall out of proper position. This is very uncommon.  Clouding of the part of your eye that holds an IOL in place. This is called an "after-cataract." These are uncommon, but easily treated. BEFORE THE PROCEDURE  Do not eat or drink anything except small amounts of water for 8 to 12 before your surgery, or as directed by your caregiver.  Unless you are told otherwise, continue any eyedrops you have been prescribed.  Talk to your primary caregiver about all other medicines that you take (both prescription and non-prescription). In some cases, you may need to stop or change medicines near the time of your surgery. This is most important if you are taking blood-thinning medicine.Do not stop medicines unless you are told to do so.  Arrange for someone to drive you to and from the procedure.  Do not put contact lenses in either eye on the day of your surgery. PROCEDURE There is more than one method for safely removing a cataract. Your doctor can explain the differences and help determine which is best for you. Phacoemulsification surgery is the most common form of cataract surgery.  An injection is given behind the eye or eyedrops are given to make this a painless procedure.  A small cut (incision) is made on the edge of the clear, dome-shaped surface that covers the front of the eye (cornea).  A tiny probe is  painlessly inserted into the eye. This device gives off ultrasound waves that soften and break up the cloudy center of the lens. This makes it easier for the cloudy lens to be removed by suction.  An IOL may be implanted.  The normal lens of the eye is covered by a clear capsule. Part of that capsule is intentionally left in the eye to support the IOL.  Your surgeon may or may not use stitches to close the incision. There are other forms of cataract surgery that require a larger incision and stiches to close the eye. This approach  is taken in cases where the doctor feels that the cataract cannot be easily removed using phacoemulsification. AFTER THE PROCEDURE  When an IOL is implanted, it does not need care. It becomes a permanent part of your eye and cannot be seen or felt.  Your doctor will schedule follow-up exams to check on your progress.  Review your other medicines with your doctor to see which can be resumed after surgery.  Use eyedrops or take medicine as prescribed by your doctor. Document Released: 05/10/2011 Document Revised: 08/13/2011 Document Reviewed: 05/10/2011 [facility] Patient Information 2013 [place].    PATIENT INSTRUCTIONS POST-ANESTHESIA  IMMEDIATELY FOLLOWING SURGERY:  Do not drive or operate machinery for the first twenty four hours after surgery.  Do not make any important decisions for twenty four hours after surgery or while taking narcotic pain medications or sedatives.  If you develop intractable nausea and vomiting or a severe headache please notify your doctor immediately.  FOLLOW-UP:  Please make an appointment with your surgeon as instructed. You do not need to follow up with anesthesia unless specifically instructed to do so.  WOUND CARE INSTRUCTIONS (if applicable):  Keep a dry clean dressing on the anesthesia/puncture wound site if there is drainage.  Once the wound has quit draining you may leave it open to air.  Generally you should leave the bandage intact for twenty four hours unless there is drainage.  If the epidural site drains for more than 36-48 hours please call the anesthesia department.  QUESTIONS?:  Please feel free to call your physician or the hospital operator if you have any questions, and they will be happy to assist you.

## 2013-12-02 MED ORDER — LIDOCAINE HCL (PF) 1 % IJ SOLN
INTRAMUSCULAR | Status: AC
  Filled 2013-12-02: qty 2

## 2013-12-02 MED ORDER — CYCLOPENTOLATE-PHENYLEPHRINE OP SOLN OPTIME - NO CHARGE
OPHTHALMIC | Status: AC
  Filled 2013-12-02: qty 2

## 2013-12-02 MED ORDER — PHENYLEPHRINE HCL 2.5 % OP SOLN
OPHTHALMIC | Status: AC
  Filled 2013-12-02: qty 15

## 2013-12-02 MED ORDER — TETRACAINE HCL 0.5 % OP SOLN
OPHTHALMIC | Status: AC
  Filled 2013-12-02: qty 2

## 2013-12-02 MED ORDER — LIDOCAINE HCL 3.5 % OP GEL
OPHTHALMIC | Status: AC
  Filled 2013-12-02: qty 1

## 2013-12-02 MED ORDER — NEOMYCIN-POLYMYXIN-DEXAMETH 3.5-10000-0.1 OP SUSP
OPHTHALMIC | Status: AC
  Filled 2013-12-02: qty 5

## 2013-12-03 ENCOUNTER — Encounter (HOSPITAL_COMMUNITY): Payer: Self-pay | Admitting: *Deleted

## 2013-12-03 ENCOUNTER — Ambulatory Visit (HOSPITAL_COMMUNITY): Payer: BC Managed Care – PPO | Admitting: Anesthesiology

## 2013-12-03 ENCOUNTER — Encounter (HOSPITAL_COMMUNITY): Payer: BC Managed Care – PPO | Admitting: Anesthesiology

## 2013-12-03 ENCOUNTER — Encounter (HOSPITAL_COMMUNITY)
Admission: RE | Disposition: A | Discharge status: Home / Self Care | Payer: Self-pay | Source: Ambulatory Visit | Attending: Ophthalmology

## 2013-12-03 ENCOUNTER — Ambulatory Visit (HOSPITAL_COMMUNITY)
Admission: RE | Admit: 2013-12-03 | Discharge: 2013-12-03 | Disposition: A | Discharge status: Home / Self Care | Payer: BC Managed Care – PPO | Source: Ambulatory Visit | Attending: Ophthalmology | Admitting: Ophthalmology

## 2013-12-03 DIAGNOSIS — I2510 Atherosclerotic heart disease of native coronary artery without angina pectoris: Secondary | ICD-10-CM | POA: Insufficient documentation

## 2013-12-03 DIAGNOSIS — I10 Essential (primary) hypertension: Secondary | ICD-10-CM | POA: Insufficient documentation

## 2013-12-03 DIAGNOSIS — Z794 Long term (current) use of insulin: Secondary | ICD-10-CM | POA: Insufficient documentation

## 2013-12-03 DIAGNOSIS — E119 Type 2 diabetes mellitus without complications: Secondary | ICD-10-CM | POA: Insufficient documentation

## 2013-12-03 DIAGNOSIS — H2589 Other age-related cataract: Secondary | ICD-10-CM | POA: Insufficient documentation

## 2013-12-03 DIAGNOSIS — Z951 Presence of aortocoronary bypass graft: Secondary | ICD-10-CM | POA: Insufficient documentation

## 2013-12-03 DIAGNOSIS — Z7982 Long term (current) use of aspirin: Secondary | ICD-10-CM | POA: Insufficient documentation

## 2013-12-03 HISTORY — PX: CATARACT EXTRACTION W/PHACO: SHX586

## 2013-12-03 LAB — SURGICAL PCR SCREEN
MRSA, PCR: NEGATIVE
Staphylococcus aureus: NEGATIVE

## 2013-12-03 LAB — GLUCOSE, CAPILLARY: GLUCOSE-CAPILLARY: 225 mg/dL — AB (ref 70–99)

## 2013-12-03 SURGERY — PHACOEMULSIFICATION, CATARACT, WITH IOL INSERTION
Anesthesia: Monitor Anesthesia Care | Site: Eye | Laterality: Left

## 2013-12-03 MED ORDER — LIDOCAINE HCL 3.5 % OP GEL: 1.0000 "application " | Freq: Once | OPHTHALMIC | Status: DC

## 2013-12-03 MED ORDER — EPINEPHRINE HCL 1 MG/ML IJ SOLN
INTRAOCULAR | Status: DC | PRN
  Administered 2013-12-03: 12:00:00

## 2013-12-03 MED ORDER — PHENYLEPHRINE HCL 2.5 % OP SOLN
1.0000 [drp] | OPHTHALMIC | Status: AC
  Administered 2013-12-03 (×3): 1 [drp] via OPHTHALMIC

## 2013-12-03 MED ORDER — FENTANYL CITRATE 0.05 MG/ML IJ SOLN
25.0000 ug | INTRAMUSCULAR | Status: AC
  Administered 2013-12-03 (×2): 25 ug via INTRAVENOUS

## 2013-12-03 MED ORDER — LIDOCAINE HCL (PF) 1 % IJ SOLN
INTRAMUSCULAR | Status: DC | PRN
  Administered 2013-12-03: .5 mL

## 2013-12-03 MED ORDER — NEOMYCIN-POLYMYXIN-DEXAMETH 3.5-10000-0.1 OP SUSP
OPHTHALMIC | Status: DC | PRN
  Administered 2013-12-03: 2 [drp] via OPHTHALMIC

## 2013-12-03 MED ORDER — FENTANYL CITRATE 0.05 MG/ML IJ SOLN
INTRAMUSCULAR | Status: AC
  Filled 2013-12-03: qty 2

## 2013-12-03 MED ORDER — LIDOCAINE 3.5 % OP GEL OPTIME - NO CHARGE
OPHTHALMIC | Status: DC | PRN
  Administered 2013-12-03: 2 [drp] via OPHTHALMIC

## 2013-12-03 MED ORDER — FENTANYL CITRATE 0.05 MG/ML IJ SOLN: 25.0000 ug | INTRAMUSCULAR | Status: DC | PRN

## 2013-12-03 MED ORDER — MIDAZOLAM HCL 2 MG/2ML IJ SOLN
1.0000 mg | INTRAMUSCULAR | Status: DC | PRN
  Administered 2013-12-03: 2 mg via INTRAVENOUS

## 2013-12-03 MED ORDER — MIDAZOLAM HCL 2 MG/2ML IJ SOLN
INTRAMUSCULAR | Status: AC
  Filled 2013-12-03: qty 2

## 2013-12-03 MED ORDER — MUPIROCIN 2 % EX OINT
TOPICAL_OINTMENT | Freq: Two times a day (BID) | CUTANEOUS | Status: DC
  Administered 2013-12-03: 1 via NASAL

## 2013-12-03 MED ORDER — MUPIROCIN 2 % EX OINT
TOPICAL_OINTMENT | CUTANEOUS | Status: AC
  Filled 2013-12-03: qty 22

## 2013-12-03 MED ORDER — POVIDONE-IODINE 5 % OP SOLN
OPHTHALMIC | Status: DC | PRN
  Administered 2013-12-03: 1 via OPHTHALMIC

## 2013-12-03 MED ORDER — CYCLOPENTOLATE-PHENYLEPHRINE 0.2-1 % OP SOLN
1.0000 [drp] | OPHTHALMIC | Status: AC
  Administered 2013-12-03 (×3): 1 [drp] via OPHTHALMIC

## 2013-12-03 MED ORDER — EPINEPHRINE HCL 1 MG/ML IJ SOLN
INTRAMUSCULAR | Status: AC
  Filled 2013-12-03: qty 1

## 2013-12-03 MED ORDER — BSS IO SOLN
INTRAOCULAR | Status: DC | PRN
  Administered 2013-12-03: 15 mL via INTRAOCULAR

## 2013-12-03 MED ORDER — LACTATED RINGERS IV SOLN
INTRAVENOUS | Status: DC
  Administered 2013-12-03: 1000 mL via INTRAVENOUS

## 2013-12-03 MED ORDER — PROVISC 10 MG/ML IO SOLN
INTRAOCULAR | Status: DC | PRN
  Administered 2013-12-03: 0.85 mL via INTRAOCULAR

## 2013-12-03 MED ORDER — TETRACAINE HCL 0.5 % OP SOLN
1.0000 [drp] | OPHTHALMIC | Status: AC
  Administered 2013-12-03 (×3): 1 [drp] via OPHTHALMIC

## 2013-12-03 MED ORDER — ONDANSETRON HCL 4 MG/2ML IJ SOLN: 4.0000 mg | Freq: Once | INTRAMUSCULAR | Status: DC | PRN

## 2013-12-03 SURGICAL SUPPLY — 34 items
CAPSULAR TENSION RING-AMO (OPHTHALMIC RELATED) IMPLANT
CLOTH BEACON ORANGE TIMEOUT ST (SAFETY) ×1 IMPLANT
EYE SHIELD UNIVERSAL CLEAR (GAUZE/BANDAGES/DRESSINGS) ×1 IMPLANT
GLOVE BIO SURGEON STRL SZ 6.5 (GLOVE) IMPLANT
GLOVE BIOGEL PI IND STRL 6.5 (GLOVE) IMPLANT
GLOVE BIOGEL PI IND STRL 7.0 (GLOVE) IMPLANT
GLOVE BIOGEL PI IND STRL 7.5 (GLOVE) IMPLANT
GLOVE BIOGEL PI IND STRL 8.5 (GLOVE) IMPLANT
GLOVE BIOGEL PI INDICATOR 6.5 (GLOVE) ×1
GLOVE BIOGEL PI INDICATOR 7.0 (GLOVE) ×1
GLOVE BIOGEL PI INDICATOR 7.5 (GLOVE)
GLOVE BIOGEL PI INDICATOR 8.5 (GLOVE) ×1
GLOVE ECLIPSE 6.5 STRL STRAW (GLOVE) IMPLANT
GLOVE ECLIPSE 7.0 STRL STRAW (GLOVE) IMPLANT
GLOVE ECLIPSE 7.5 STRL STRAW (GLOVE) IMPLANT
GLOVE EXAM NITRILE LRG STRL (GLOVE) IMPLANT
GLOVE EXAM NITRILE MD LF STRL (GLOVE) IMPLANT
GLOVE SKINSENSE NS SZ6.5 (GLOVE)
GLOVE SKINSENSE NS SZ7.0 (GLOVE)
GLOVE SKINSENSE STRL SZ6.5 (GLOVE) IMPLANT
GLOVE SKINSENSE STRL SZ7.0 (GLOVE) IMPLANT
KIT VITRECTOMY (OPHTHALMIC RELATED) IMPLANT
PAD ARMBOARD 7.5X6 YLW CONV (MISCELLANEOUS) ×1 IMPLANT
PROC W NO LENS (INTRAOCULAR LENS)
PROC W SPEC LENS (INTRAOCULAR LENS)
PROCESS W NO LENS (INTRAOCULAR LENS) IMPLANT
PROCESS W SPEC LENS (INTRAOCULAR LENS) IMPLANT
RING MALYGIN (MISCELLANEOUS) IMPLANT
SIGHTPATH CAT PROC W REG LENS (Ophthalmic Related) ×2 IMPLANT
SYR TB 1ML LL NO SAFETY (SYRINGE) ×1 IMPLANT
TAPE SURG TRANSPORE 1 IN (GAUZE/BANDAGES/DRESSINGS) IMPLANT
TAPE SURGICAL TRANSPORE 1 IN (GAUZE/BANDAGES/DRESSINGS) ×1
VISCOELASTIC ADDITIONAL (OPHTHALMIC RELATED) IMPLANT
WATER STERILE IRR 250ML POUR (IV SOLUTION) ×1 IMPLANT

## 2013-12-03 NOTE — Anesthesia Postprocedure Evaluation (Signed)
  Anesthesia Post-op Note  Patient: Francis Hall  Procedure(s) Performed: Procedure(s) (LRB): CATARACT EXTRACTION PHACO AND INTRAOCULAR LENS PLACEMENT (IOC) (Left)  Patient Location:  Short Stay  Anesthesia Type: MAC  Level of Consciousness: awake  Airway and Oxygen Therapy: Patient Spontanous Breathing  Post-op Pain: none  Post-op Assessment: Post-op Vital signs reviewed, Patient's Cardiovascular Status Stable, Respiratory Function Stable, Patent Airway, No signs of Nausea or vomiting and Pain level controlled  Post-op Vital Signs: Reviewed and stable  Complications: No apparent anesthesia complications

## 2013-12-03 NOTE — Transfer of Care (Signed)
Immediate Anesthesia Transfer of Care Note  Patient: Francis Hall  Procedure(s) Performed: Procedure(s) (LRB): CATARACT EXTRACTION PHACO AND INTRAOCULAR LENS PLACEMENT (IOC) (Left)  Patient Location: Shortstay  Anesthesia Type: MAC  Level of Consciousness: awake  Airway & Oxygen Therapy: Patient Spontanous Breathing   Post-op Assessment: Report given to PACU RN, Post -op Vital signs reviewed and stable and Patient moving all extremities  Post vital signs: Reviewed and stable  Complications: No apparent anesthesia complications

## 2013-12-03 NOTE — Discharge Instructions (Signed)

## 2013-12-03 NOTE — Anesthesia Preprocedure Evaluation (Signed)
Anesthesia Evaluation  Patient identified by MRN, date of birth, ID band Patient awake    Reviewed: Allergy & Precautions, H&P , NPO status , Patient's Chart, lab work & pertinent test results  Airway Mallampati: II TM Distance: >3 FB     Dental  (+) Teeth Intact   Pulmonary shortness of breath, sleep apnea ,  breath sounds clear to auscultation        Cardiovascular hypertension, Pt. on medications + CAD, + Past MI, + CABG and +CHF Rhythm:Regular Rate:Normal     Neuro/Psych    GI/Hepatic negative GI ROS,   Endo/Other  diabetes, Well Controlled, Type 2, Insulin Dependent  Renal/GU      Musculoskeletal   Abdominal   Peds  Hematology   Anesthesia Other Findings   Reproductive/Obstetrics                           Anesthesia Physical Anesthesia Plan  ASA: III  Anesthesia Plan: MAC   Post-op Pain Management:    Induction: Intravenous  Airway Management Planned: Nasal Cannula  Additional Equipment:   Intra-op Plan:   Post-operative Plan:   Informed Consent: I have reviewed the patients History and Physical, chart, labs and discussed the procedure including the risks, benefits and alternatives for the proposed anesthesia with the patient or authorized representative who has indicated his/her understanding and acceptance.     Plan Discussed with:   Anesthesia Plan Comments:         Anesthesia Quick Evaluation

## 2013-12-03 NOTE — Anesthesia Procedure Notes (Signed)
Procedure Name: MAC Date/Time: 12/03/2013 12:05 PM Performed by: Vista Deck Pre-anesthesia Checklist: Patient identified, Emergency Drugs available, Suction available, Timeout performed and Patient being monitored Patient Re-evaluated:Patient Re-evaluated prior to inductionOxygen Delivery Method: Nasal Cannula

## 2013-12-03 NOTE — Op Note (Addendum)
Date of Admission: 12/03/2013  Date of Surgery: 12/03/2013   Pre-Op Dx: Cataract Left Eye  Post-Op Dx: Senile Combined Cataract Left  Eye,  Dx Code 366.19  Surgeon: Tonny Branch, M.D.  Assistants: None  Anesthesia: Topical with MAC  Indications: Painless, progressive loss of vision with compromise of daily activities.  Surgery: Cataract Extraction with Intraocular lens Implant Left Eye  Discription: The patient had dilating drops and viscous lidocaine placed into the Left eye in the pre-op holding area. After transfer to the operating room, a time out was performed. The patient was then prepped and draped. Beginning with a 78 degree blade a paracentesis port was made at the surgeon's 2 o'clock position. The anterior chamber was then filled with 1% non-preserved lidocaine. This was followed by filling the anterior chamber with Provisc.  A 2.81mm keratome blade was used to make a clear corneal incision at the temporal limbus.  A bent cystatome needle was used to create a continuous tear capsulotomy. Hydrodissection was performed with balanced salt solution on a Fine canula. The lens nucleus was then removed using the phacoemulsification handpiece. Residual cortex was removed with the I&A handpiece. The anterior chamber and capsular bag were refilled with Provisc. A posterior chamber intraocular lens was placed into the capsular bag with it's injector. The implant was positioned with the Kuglan hook. The Provisc was then removed from the anterior chamber and capsular bag with the I&A handpiece. Stromal hydration of the main incision and paracentesis port was performed with BSS on a Fine canula. The wounds were tested for leak which was negative. The patient tolerated the procedure well. There were no operative complications. The patient was then transferred to the recovery room in stable condition.  Complications: None  Specimen: None  EBL: None  Prosthetic device: Hoya iSert 250, power 17.5 D, SN  [id number].

## 2013-12-03 NOTE — H&P (Signed)
I have reviewed the H&P, the patient was re-examined, and I have identified no interval changes in medical condition and plan of care since the history and physical of record  

## 2013-12-07 ENCOUNTER — Encounter (HOSPITAL_COMMUNITY): Payer: Self-pay | Admitting: Ophthalmology

## 2013-12-07 ENCOUNTER — Other Ambulatory Visit: Payer: Self-pay | Admitting: Cardiovascular Disease

## 2013-12-07 ENCOUNTER — Encounter (HOSPITAL_COMMUNITY): Payer: Self-pay | Admitting: Pharmacy Technician

## 2013-12-07 NOTE — Telephone Encounter (Signed)
Rx was sent to pharmacy electronically. 

## 2013-12-09 ENCOUNTER — Encounter (HOSPITAL_COMMUNITY)
Admission: RE | Admit: 2013-12-09 | Discharge: 2013-12-09 | Disposition: A | Discharge status: Home / Self Care | Payer: BC Managed Care – PPO | Source: Ambulatory Visit | Attending: Ophthalmology | Admitting: Ophthalmology

## 2013-12-11 MED ORDER — NEOMYCIN-POLYMYXIN-DEXAMETH 3.5-10000-0.1 OP SUSP
OPHTHALMIC | Status: AC
  Filled 2013-12-11: qty 5

## 2013-12-11 MED ORDER — PHENYLEPHRINE HCL 2.5 % OP SOLN
OPHTHALMIC | Status: AC
Start: 2013-12-11 — End: 2013-12-11
  Filled 2013-12-11: qty 15

## 2013-12-11 MED ORDER — LIDOCAINE HCL (PF) 1 % IJ SOLN
INTRAMUSCULAR | Status: AC
  Filled 2013-12-11: qty 2

## 2013-12-11 MED ORDER — CYCLOPENTOLATE-PHENYLEPHRINE OP SOLN OPTIME - NO CHARGE
OPHTHALMIC | Status: AC
  Filled 2013-12-11: qty 2

## 2013-12-11 MED ORDER — TETRACAINE HCL 0.5 % OP SOLN
OPHTHALMIC | Status: AC
  Filled 2013-12-11: qty 2

## 2013-12-11 MED ORDER — LIDOCAINE HCL 3.5 % OP GEL
OPHTHALMIC | Status: AC
Start: 2013-12-11 — End: 2013-12-11
  Filled 2013-12-11: qty 1

## 2013-12-14 ENCOUNTER — Encounter (HOSPITAL_COMMUNITY)
Admission: RE | Disposition: A | Discharge status: Home / Self Care | Payer: Self-pay | Source: Ambulatory Visit | Attending: Ophthalmology

## 2013-12-14 ENCOUNTER — Ambulatory Visit (HOSPITAL_COMMUNITY)
Admission: RE | Admit: 2013-12-14 | Discharge: 2013-12-14 | Disposition: A | Discharge status: Home / Self Care | Payer: BC Managed Care – PPO | Source: Ambulatory Visit | Attending: Ophthalmology | Admitting: Ophthalmology

## 2013-12-14 ENCOUNTER — Ambulatory Visit (HOSPITAL_COMMUNITY): Payer: BC Managed Care – PPO | Admitting: Anesthesiology

## 2013-12-14 ENCOUNTER — Encounter (HOSPITAL_COMMUNITY): Payer: BC Managed Care – PPO | Admitting: Anesthesiology

## 2013-12-14 ENCOUNTER — Encounter (HOSPITAL_COMMUNITY): Payer: Self-pay

## 2013-12-14 DIAGNOSIS — Z7982 Long term (current) use of aspirin: Secondary | ICD-10-CM | POA: Insufficient documentation

## 2013-12-14 DIAGNOSIS — H269 Unspecified cataract: Secondary | ICD-10-CM | POA: Insufficient documentation

## 2013-12-14 DIAGNOSIS — Z794 Long term (current) use of insulin: Secondary | ICD-10-CM | POA: Insufficient documentation

## 2013-12-14 DIAGNOSIS — I2510 Atherosclerotic heart disease of native coronary artery without angina pectoris: Secondary | ICD-10-CM | POA: Insufficient documentation

## 2013-12-14 DIAGNOSIS — I10 Essential (primary) hypertension: Secondary | ICD-10-CM | POA: Insufficient documentation

## 2013-12-14 DIAGNOSIS — E119 Type 2 diabetes mellitus without complications: Secondary | ICD-10-CM | POA: Insufficient documentation

## 2013-12-14 DIAGNOSIS — H52209 Unspecified astigmatism, unspecified eye: Secondary | ICD-10-CM | POA: Insufficient documentation

## 2013-12-14 DIAGNOSIS — Z951 Presence of aortocoronary bypass graft: Secondary | ICD-10-CM | POA: Insufficient documentation

## 2013-12-14 HISTORY — PX: CATARACT EXTRACTION W/PHACO: SHX586

## 2013-12-14 LAB — GLUCOSE, CAPILLARY: GLUCOSE-CAPILLARY: 261 mg/dL — AB (ref 70–99)

## 2013-12-14 SURGERY — PHACOEMULSIFICATION, CATARACT, WITH IOL INSERTION
Anesthesia: Monitor Anesthesia Care | Site: Eye | Laterality: Right

## 2013-12-14 MED ORDER — LIDOCAINE 3.5 % OP GEL OPTIME - NO CHARGE
OPHTHALMIC | Status: DC | PRN
  Administered 2013-12-14: 1 [drp] via OPHTHALMIC

## 2013-12-14 MED ORDER — MIDAZOLAM HCL 2 MG/2ML IJ SOLN
INTRAMUSCULAR | Status: AC
  Filled 2013-12-14: qty 2

## 2013-12-14 MED ORDER — LIDOCAINE HCL 3.5 % OP GEL
1.0000 "application " | Freq: Once | OPHTHALMIC | Status: AC
  Administered 2013-12-14: 1 via OPHTHALMIC

## 2013-12-14 MED ORDER — LACTATED RINGERS IV SOLN
INTRAVENOUS | Status: DC
  Administered 2013-12-14: 09:00:00 via INTRAVENOUS

## 2013-12-14 MED ORDER — MIDAZOLAM HCL 2 MG/2ML IJ SOLN
1.0000 mg | INTRAMUSCULAR | Status: DC | PRN
  Administered 2013-12-14: 2 mg via INTRAVENOUS

## 2013-12-14 MED ORDER — BSS IO SOLN
INTRAOCULAR | Status: DC | PRN
  Administered 2013-12-14: 10:00:00

## 2013-12-14 MED ORDER — FENTANYL CITRATE 0.05 MG/ML IJ SOLN
25.0000 ug | INTRAMUSCULAR | Status: AC
  Administered 2013-12-14 (×2): 25 ug via INTRAVENOUS

## 2013-12-14 MED ORDER — PHENYLEPHRINE HCL 2.5 % OP SOLN
1.0000 [drp] | OPHTHALMIC | Status: AC
  Administered 2013-12-14 (×3): 1 [drp] via OPHTHALMIC

## 2013-12-14 MED ORDER — NEOMYCIN-POLYMYXIN-DEXAMETH 3.5-10000-0.1 OP SUSP
OPHTHALMIC | Status: DC | PRN
  Administered 2013-12-14: 2 [drp] via OPHTHALMIC

## 2013-12-14 MED ORDER — CYCLOPENTOLATE-PHENYLEPHRINE 0.2-1 % OP SOLN
1.0000 [drp] | OPHTHALMIC | Status: AC
  Administered 2013-12-14 (×3): 1 [drp] via OPHTHALMIC

## 2013-12-14 MED ORDER — LIDOCAINE HCL (PF) 1 % IJ SOLN
INTRAMUSCULAR | Status: DC | PRN
  Administered 2013-12-14: .4 mL

## 2013-12-14 MED ORDER — FENTANYL CITRATE 0.05 MG/ML IJ SOLN
INTRAMUSCULAR | Status: AC
  Filled 2013-12-14: qty 2

## 2013-12-14 MED ORDER — PROVISC 10 MG/ML IO SOLN
INTRAOCULAR | Status: DC | PRN
Start: 2013-12-14 — End: 2013-12-14
  Administered 2013-12-14: 0.85 mL via INTRAOCULAR

## 2013-12-14 MED ORDER — POVIDONE-IODINE 5 % OP SOLN
OPHTHALMIC | Status: DC | PRN
  Administered 2013-12-14: 1 via OPHTHALMIC

## 2013-12-14 MED ORDER — TETRACAINE HCL 0.5 % OP SOLN
1.0000 [drp] | OPHTHALMIC | Status: AC
  Administered 2013-12-14 (×3): 1 [drp] via OPHTHALMIC

## 2013-12-14 MED ORDER — EPINEPHRINE HCL 1 MG/ML IJ SOLN
INTRAMUSCULAR | Status: AC
  Filled 2013-12-14: qty 1

## 2013-12-14 MED ORDER — BSS IO SOLN
INTRAOCULAR | Status: DC | PRN
  Administered 2013-12-14: 15 mL via INTRAOCULAR

## 2013-12-14 SURGICAL SUPPLY — 32 items
CAPSULAR TENSION RING-AMO (OPHTHALMIC RELATED) IMPLANT
CLOTH BEACON ORANGE TIMEOUT ST (SAFETY) ×1 IMPLANT
EYE SHIELD UNIVERSAL CLEAR (GAUZE/BANDAGES/DRESSINGS) ×1 IMPLANT
GLOVE BIO SURGEON STRL SZ 6.5 (GLOVE) IMPLANT
GLOVE BIOGEL PI IND STRL 6.5 (GLOVE) IMPLANT
GLOVE BIOGEL PI IND STRL 7.0 (GLOVE) IMPLANT
GLOVE BIOGEL PI IND STRL 7.5 (GLOVE) IMPLANT
GLOVE BIOGEL PI INDICATOR 6.5 (GLOVE)
GLOVE BIOGEL PI INDICATOR 7.0 (GLOVE) ×1
GLOVE BIOGEL PI INDICATOR 7.5 (GLOVE)
GLOVE ECLIPSE 6.5 STRL STRAW (GLOVE) IMPLANT
GLOVE ECLIPSE 7.0 STRL STRAW (GLOVE) IMPLANT
GLOVE ECLIPSE 7.5 STRL STRAW (GLOVE) IMPLANT
GLOVE EXAM NITRILE LRG STRL (GLOVE) IMPLANT
GLOVE EXAM NITRILE MD LF STRL (GLOVE) IMPLANT
GLOVE SKINSENSE NS SZ6.5 (GLOVE)
GLOVE SKINSENSE NS SZ7.0 (GLOVE) ×1
GLOVE SKINSENSE STRL SZ6.5 (GLOVE) IMPLANT
GLOVE SKINSENSE STRL SZ7.0 (GLOVE) IMPLANT
KIT VITRECTOMY (OPHTHALMIC RELATED) IMPLANT
PAD ARMBOARD 7.5X6 YLW CONV (MISCELLANEOUS) ×1 IMPLANT
PROC W NO LENS (INTRAOCULAR LENS)
PROC W SPEC LENS (INTRAOCULAR LENS)
PROCESS W NO LENS (INTRAOCULAR LENS) IMPLANT
PROCESS W SPEC LENS (INTRAOCULAR LENS) IMPLANT
RING MALYGIN (MISCELLANEOUS) IMPLANT
SIGHTPATH CAT PROC W REG LENS (Ophthalmic Related) ×2 IMPLANT
SYR TB 1ML LL NO SAFETY (SYRINGE) ×1 IMPLANT
TAPE SURG TRANSPORE 1 IN (GAUZE/BANDAGES/DRESSINGS) IMPLANT
TAPE SURGICAL TRANSPORE 1 IN (GAUZE/BANDAGES/DRESSINGS) ×1
VISCOELASTIC ADDITIONAL (OPHTHALMIC RELATED) IMPLANT
WATER STERILE IRR 250ML POUR (IV SOLUTION) ×1 IMPLANT

## 2013-12-14 NOTE — Transfer of Care (Signed)
Immediate Anesthesia Transfer of Care Note  Patient: Francis Hall  Procedure(s) Performed: Procedure(s) with comments: CATARACT EXTRACTION PHACO AND INTRAOCULAR LENS PLACEMENT (IOC) (Right) - CDE 7.84   Patient Location: Short Stay  Anesthesia Type:MAC  Level of Consciousness: awake, alert  and oriented  Airway & Oxygen Therapy: Patient Spontanous Breathing  Post-op Assessment: Report given to PACU RN  Post vital signs: Reviewed and stable  Complications: No apparent anesthesia complications

## 2013-12-14 NOTE — Discharge Instructions (Signed)

## 2013-12-14 NOTE — Op Note (Signed)
Date of Admission: 12/14/2013  Date of Surgery: 12/14/2013  Pre-Op Dx: Cataract Right Eye  Post-Op Dx: Combined Cataract Right Eye,  Dx Code 366.19, Astigmatism Right Eye, Dx Code 633.35  Surgeon: Tonny Branch, M.D.  Assistants: None  Anesthesia: Topical with MAC  Indications: Painless, progressive loss of vision with compromise of daily activities.  Surgery: Cataract Extraction with Intraocular lens Implant Right Eye  Discription: The patient had dilating drops and viscous lidocaine placed into the Right in the pre-op holding area. In the sitting position horizontal reference marks were made on the cornea.  After transfer to the operating room, a time out was performed. The patient was then prepped and draped. Beginning with a 90 degree blade a paracentesis port was made at the surgeon's 2 o'clock position. The anterior chamber was then filled with 1% non-preserved lidocaine. This was followed by filling the anterior chamber with Provisc. A 2.31mm keratome blade was used to make a clear cornea incision at the temporal limbus. A bent cystatome needle was used to create a continuous tear capsulotomy. Hydrodissection was performed with balanced salt solution on a Fine canula. The lens nucleus was then removed using the phacoemulsification handpiece. Residual cortex was removed with the I&A handpiece. The anterior chamber and capsular bag were refilled with Provisc. A posterior chamber intraocular lens was placed into the capsular bag with it's injector. Additional corneal marks were made on the 179/359 degree meridians.  The Provisc was then removed from the anterior chamber and capsular bag with the I&A handpiece. The implant was positioned with the Kuglan hook on the marks. Stromal hydration of the main incision and paracentesis port was performed with BSS on a Fine canula. The wounds were tested for leak which was negative. The patient tolerated the procedure well. There were no operative  complications. The patient was then transferred to the recovery room in stable condition.  Complications: None  Specimen: None  EBL: None  Prosthetic device: STAAR Toric, [id number], power 17.0SE/+2.0, SN [id number].

## 2013-12-14 NOTE — Anesthesia Postprocedure Evaluation (Signed)
  Anesthesia Post-op Note  Patient: Francis Hall  Procedure(s) Performed: Procedure(s) with comments: CATARACT EXTRACTION PHACO AND INTRAOCULAR LENS PLACEMENT (IOC) (Right) - CDE 7.84   Patient Location: Short Stay  Anesthesia Type:MAC  Level of Consciousness: awake, alert  and oriented  Airway and Oxygen Therapy: Patient Spontanous Breathing  Post-op Pain: none  Post-op Assessment: Post-op Vital signs reviewed, Patient's Cardiovascular Status Stable, Respiratory Function Stable, Patent Airway and No signs of Nausea or vomiting  Post-op Vital Signs: Reviewed and stable  Last Vitals:  Filed Vitals:   12/14/13 0905  BP: 100/59  Pulse: 66  Temp: 36.6 C  Resp: 16    Complications: No apparent anesthesia complications

## 2013-12-14 NOTE — Transfer of Care (Signed)
Immediate Anesthesia Transfer of Care Note  Patient: Francis Hall  Procedure(s) Performed: Procedure(s) with comments: CATARACT EXTRACTION PHACO AND INTRAOCULAR LENS PLACEMENT (IOC) (Right) - CDE 7.84   Patient Location: Short Stay  Anesthesia Type:MAC  Level of Consciousness: awake  Airway & Oxygen Therapy: Patient Spontanous Breathing  Post-op Assessment: Report given to PACU RN  Post vital signs: Reviewed  Complications: No apparent anesthesia complications

## 2013-12-14 NOTE — H&P (Signed)
I have reviewed the H&P, the patient was re-examined, and I have identified no interval changes in medical condition and plan of care since the history and physical of record  

## 2013-12-14 NOTE — Anesthesia Preprocedure Evaluation (Signed)
Anesthesia Evaluation  Patient identified by MRN, date of birth, ID band Patient awake    Reviewed: Allergy & Precautions, H&P , NPO status , Patient's Chart, lab work & pertinent test results  Airway Mallampati: II TM Distance: >3 FB     Dental  (+) Teeth Intact   Pulmonary shortness of breath, sleep apnea ,  breath sounds clear to auscultation        Cardiovascular hypertension, Pt. on medications + CAD, + Past MI, + CABG and +CHF Rhythm:Regular Rate:Normal     Neuro/Psych    GI/Hepatic negative GI ROS,   Endo/Other  diabetes, Well Controlled, Type 2, Insulin Dependent  Renal/GU      Musculoskeletal   Abdominal   Peds  Hematology   Anesthesia Other Findings   Reproductive/Obstetrics                           Anesthesia Physical Anesthesia Plan  ASA: III  Anesthesia Plan: MAC   Post-op Pain Management:    Induction: Intravenous  Airway Management Planned: Nasal Cannula  Additional Equipment:   Intra-op Plan:   Post-operative Plan:   Informed Consent: I have reviewed the patients History and Physical, chart, labs and discussed the procedure including the risks, benefits and alternatives for the proposed anesthesia with the patient or authorized representative who has indicated his/her understanding and acceptance.     Plan Discussed with:   Anesthesia Plan Comments:         Anesthesia Quick Evaluation

## 2013-12-15 ENCOUNTER — Encounter (HOSPITAL_COMMUNITY): Payer: Self-pay | Admitting: Ophthalmology

## 2013-12-15 ENCOUNTER — Ambulatory Visit: Payer: BC Managed Care – PPO | Admitting: Cardiovascular Disease

## 2013-12-24 ENCOUNTER — Other Ambulatory Visit: Payer: Self-pay | Admitting: Cardiovascular Disease

## 2013-12-24 NOTE — Telephone Encounter (Signed)
Rx was sent to pharmacy electronically. 

## 2014-01-04 ENCOUNTER — Ambulatory Visit (INDEPENDENT_AMBULATORY_CARE_PROVIDER_SITE_OTHER): Payer: BC Managed Care – PPO | Admitting: Cardiovascular Disease

## 2014-01-04 ENCOUNTER — Encounter: Payer: Self-pay | Admitting: Cardiovascular Disease

## 2014-01-04 ENCOUNTER — Ambulatory Visit: Payer: BC Managed Care – PPO | Admitting: Cardiovascular Disease

## 2014-01-04 VITALS — BP 138/70 | HR 81 | Ht 67.0 in | Wt 186.5 lb

## 2014-01-04 DIAGNOSIS — Z79899 Other long term (current) drug therapy: Secondary | ICD-10-CM

## 2014-01-04 DIAGNOSIS — I10 Essential (primary) hypertension: Secondary | ICD-10-CM

## 2014-01-04 DIAGNOSIS — E785 Hyperlipidemia, unspecified: Secondary | ICD-10-CM

## 2014-01-04 DIAGNOSIS — R42 Dizziness and giddiness: Secondary | ICD-10-CM

## 2014-01-04 DIAGNOSIS — E782 Mixed hyperlipidemia: Secondary | ICD-10-CM

## 2014-01-04 DIAGNOSIS — I2510 Atherosclerotic heart disease of native coronary artery without angina pectoris: Secondary | ICD-10-CM

## 2014-01-04 NOTE — Assessment & Plan Note (Signed)
Controlled today. Will continue current medication regimen.

## 2014-01-04 NOTE — Assessment & Plan Note (Addendum)
Has remained asymptomatic. Will continue to focus on risk factor reduction, which the patient has done a fantastic job of with a decrease in his A1c to 6.9. Patient to continue to attempt to lose weight. Will continue the patient on brilanta in favor of plavix due to his platelet inhibition testing being elevated while on plavix. Discussed symptoms of recurrent angina. Will see in follow-up in one year.

## 2014-01-04 NOTE — Progress Notes (Signed)
Patient ID: SEAB AXEL, male   DOB: Jul 20, 1955, 58 y.o.   MRN: [id number]     Reason for office visit For follow-up CAD s/p recent PCI.  Doing well. Reports a few dizzy spells a few months ago. Would be fine after sitting down. Last time was one month ago. Had a couple of times in one week. Happened 5-6 times total. Would only occur right after standing up. Notes no chest pain or shortness of breath. No palpitations. Though he does state there is occasional tightness in his chest with heavy breathing that only occurs when he has been out in the heat. He notes no bleeding problems. Notes he has been out of work since September 2012 for his blood pressure being elevated and swelling. No orthopnea or PND. Uses CPAP nightly. Has not required nitroglycerin. Has noted some days where he's more tired than usual. Notes has been worse with the heat. He denies exertional or at rest chest pain and dyspnea. He notes he stops himself when outside prior to getting tired. He states he does not have to stop himself due to being tired.    Allergies  Allergen Reactions  . Crestor [Rosuvastatin] Other (See Comments)    myalgia    Current Outpatient Prescriptions  Medication Sig Dispense Refill  . acetaminophen (TYLENOL) 325 MG tablet Take 2 tablets (650 mg total) by mouth every 4 (four) hours as needed.      [name] aspirin 81 MG EC tablet Take 81 mg by mouth daily.        [name] atorvastatin (LIPITOR) 20 MG tablet Take 20 mg by mouth at bedtime.      [name] BRILINTA 90 MG TABS tablet TAKE 1 TABLET TWICE A DAY  180 tablet  1  . Canagliflozin (INVOKANA) 300 MG TABS Take 300 mg by mouth daily.      [name] CARTIA XT 120 MG 24 hr capsule TAKE 1 CAPSULE DAILY  90 capsule  1  . carvedilol (COREG) 25 MG tablet Take 25 mg by mouth 2 (two) times daily with a meal.        . Cholecalciferol (VITAMIN D3) 1000 UNITS CAPS Take 2 capsules by mouth daily.      . fenofibrate (TRICOR) 48 MG tablet TAKE 1 TABLET DAILY  90 tablet  1  .  hydrochlorothiazide (MICROZIDE) 12.5 MG capsule Take 1 capsule (12.5 mg total) by mouth daily as needed.  30 capsule  3  . insulin aspart (NOVOLOG) 100 UNIT/ML injection Inject 10-13 Units into the skin 3 (three) times daily before meals. Sliding scale      . insulin glargine (LANTUS) 100 UNIT/ML injection Inject 60 Units into the skin at bedtime.       [name] lisinopril (PRINIVIL,ZESTRIL) 20 MG tablet Take 20 mg by mouth 2 (two) times daily.      . metFORMIN (GLUCOPHAGE) 1000 MG tablet Take 1 tablet (1,000 mg total) by mouth 2 (two) times daily with a meal.  180 tablet  3  . Multiple Vitamin (MULTIVITAMIN) capsule Take 1 capsule by mouth daily.        . nitroGLYCERIN (NITROSTAT) 0.4 MG SL tablet Place 0.4 mg under the tongue every 5 (five) minutes as needed for chest pain.      [name] omega-3 acid ethyl esters (LOVAZA) 1 G capsule Take 2 capsules (2 g total) by mouth 2 (two) times daily.  360 capsule  3  . vitamin C (ASCORBIC ACID) 500 MG tablet Take 500 mg by mouth  daily.       No current facility-administered medications for this visit.    Past Medical History  Diagnosis Date  . Hyperplastic colon polyp 07/16/07    30cm   . Diabetes mellitus     insulin  . CAD (coronary artery disease)     [facility]  . Hyperlipemia   . HTN (hypertension)   . Family hx of colon cancer   . Diverticulosis   . Atrial fibrillation   . CHF (congestive heart failure) 11/08/2010    2D Echo EF=>55%  . Dyspnea   . OSA (obstructive sleep apnea) 12/21/2007    sleep study perform REM 53mins REM and NREM was 95.0%, AHI of 12.31hr and RDI of 12.4hr  . Myocardial infarction 12/2012    Past Surgical History  Procedure Laterality Date  . Coronary artery bypass graft  06/2004    x2 performed for 40% left main stenosis, 80% proximal LAD stenosis of the first diagonal artery, and 50% left circumfles artery priximal stenosis.LIMA to LAD and free radial artery to circumflex coronary artery  . Tonsillectomy  1962  .  Colonoscopy  07/2007    Dr Arnoldo Morale: hyperplastic polyp 30cm  . Colonoscopy N/A 08/12/2012    Procedure: COLONOSCOPY;  Surgeon: Danie Binder, MD;  Location: [facility];  Service: Endoscopy;  Laterality: N/A;  8:30  . Cardiac catheterization  2006 2011    2 stents  . Cataract extraction w/phaco Left 12/03/2013    Procedure: CATARACT EXTRACTION PHACO AND INTRAOCULAR LENS PLACEMENT (IOC);  Surgeon: Tonny Branch, MD;  Location: [facility];  Service: Ophthalmology;  Laterality: Left;  CDE:7.07  . Cataract extraction w/phaco Right 12/14/2013    Procedure: CATARACT EXTRACTION PHACO AND INTRAOCULAR LENS PLACEMENT (IOC);  Surgeon: Tonny Branch, MD;  Location: [facility];  Service: Ophthalmology;  Laterality: Right;  CDE 7.84     Family History  Problem Relation Age of Onset  . Colon cancer Father     45's?  . Lung cancer Father   . Arthritis    . Heart disease    . Diabetes    . Coronary artery disease Father 26    MI    History   Social History  . Marital Status: Married    Spouse Name: N/A    Number of Children: 2  . Years of Education: 12   Occupational History  . disabled Dealer     Social History Main Topics  . Smoking status: Never Smoker   . Smokeless tobacco: Never Used  . Alcohol Use: Yes     Comment: social-2 beers/wk  . Drug Use: No  . Sexual Activity: Yes    Birth Control/ Protection: None   Other Topics Concern  . Not on file   Social History Narrative  . No narrative on file    Review of systems: The patient specifically denies any chest pain at rest or with exertion, dyspnea at rest or with exertion, orthopnea, paroxysmal nocturnal dyspnea, syncope, palpitations, focal neurological deficits, intermittent claudication, lower extremity edema, unexplained weight gain   PHYSICAL EXAM BP 160/72  Pulse 81  Ht 5\' 7"  (1.702 m)  Wt 186 lb 8 oz (84.596 kg)  BMI 29.20 kg/m2 General: Alert, oriented x3, no distress  Head: no evidence of trauma, PERRL, EOMI, no  exophtalmos or lid lag; normal ears, nose and oropharynx  Neck: normal jugular venous pulsations and no hepatojugular reflux; brisk carotid pulses without delay and no carotid bruits  Chest: clear to auscultation, symmetrical  and full respiratory excursions Cardiovascular: normal position and quality of the apical impulse, regular rhythm, normal first and second heart sounds, no murmurs, rubs or gallops  Abdomen: no tenderness or distention, no masses by palpation, no abnormal pulsatility or arterial bruits, normal bowel sounds, no hepatosplenomegaly  Extremities: no clubbing, cyanosis or edema; 2+ right radial artery pulse, left radial artery harvested Neurological: grossly nonfocal  Lipid Panel     Component Value Date/Time   CHOL 175 12/29/2012 0438   TRIG 574* 12/29/2012 0438   HDL 25* 12/29/2012 0438   CHOLHDL 7.0 12/29/2012 0438   VLDL UNABLE TO CALCULATE IF TRIGLYCERIDE OVER 400 mg/dL 12/29/2012 0438   LDLCALC UNABLE TO CALCULATE IF TRIGLYCERIDE OVER 400 mg/dL 12/29/2012 0438    BMET    Component Value Date/Time   NA 139 11/27/2013 0915   K 4.5 11/27/2013 0915   CL 103 11/27/2013 0915   CO2 22 11/27/2013 0915   GLUCOSE 218* 11/27/2013 0915   BUN 21 11/27/2013 0915   CREATININE 0.87 11/27/2013 0915   CALCIUM 9.5 11/27/2013 0915   GFRNONAA >90 11/27/2013 0915   GFRAA >90 11/27/2013 0915   EKG: PACs present otherwise unremarkable  ASSESSMENT AND PLAN CAD (coronary artery disease), Hx CABG 2006, s/p PCI on long mid RCA lesionsand distal RCA-RPLA lesion with PTCA of the ostial-proximal RPDA 12/29/12  Has remained asymptomatic. Will continue to focus on risk factor reduction, which the patient has done a fantastic job of with a decrease in his A1c to 6.9. Patient to continue to attempt to lose weight. Will continue the patient on brilanta in favor of plavix due to his platelet inhibition testing being elevated while on plavix. Discussed symptoms of recurrent angina. Will see in follow-up in one  year.  Hyperlipidemia Patient has done well on lipitor and tricor. He was previously due for a lipid panel, though he did not get this done. He notes he may have had this with his Endocrinologist. We will request these labs from his endocrinologist. If they have not been done we will have him get labs here for follow-up of this issue. He is to continue lipitor and tricor.  HTN (hypertension) Controlled today. Will continue current medication regimen.   Dizziness Resolved at this time. History would indicate orthostatic hypotension. Will continue to monitor at this time. Will monitor blood pressure as patient loses weight and decrease blood pressure medications if indicated.   Orders Placed This Encounter  Procedures  . EKG 12-Lead   No orders of the defined types were placed in this encounter.    Patient is to follow-up in 12 months or sooner as needed.  Tommi Rumps, MD [facility] PGY-2  Sanda Klein, MD, [facility] [phone] office [phone] pager  I have seen and examined the patient along with Tommi Rumps, MD.  I have reviewed the chart, notes and new data.  I agree with his note.  Key new complaints: Shavon describes fatigue, but has not had angina or dyspnea on exertion Key examination changes: Substantial weight loss, and no clinical signs of heart failure, no arrhythmia Key new findings / data: Marked improvement in risk factor control, especially in glucose levels.  PLAN: Will get the results of his recent labs. His lipid profile has not been performed in the last 6 months we will request one. We may need to reduce the dose of his diuretics if he continues to lose weight as his blood pressure seems to be lower now. He  has set a target weight loss down to 170 lb, which I think would be beneficial and realistic. Unfortunately, I believe he is still at high risk of recurrent coronary events and with continual long-term antiplatelet  therapy with an effective agent. He notes that while on treatment with clopidogrel he had high PRU. Continue Brilinta.  01/04/2014, 6:16 PM

## 2014-01-04 NOTE — Assessment & Plan Note (Signed)
Resolved at this time. History would indicate orthostatic hypotension. Will continue to monitor at this time. Will monitor blood pressure as patient loses weight and decrease blood pressure medications if indicated.

## 2014-01-04 NOTE — Assessment & Plan Note (Signed)
Patient has done well on lipitor and tricor. He was previously due for a lipid panel, though he did not get this done. He notes he may have had this with his Endocrinologist. We will request these labs from his endocrinologist. If they have not been done we will have him get labs here for follow-up of this issue. He is to continue lipitor and tricor.

## 2014-01-04 NOTE — Patient Instructions (Signed)
If you have not had lab work done this year at Dr. Liliane Channel office take the lab order to the [facility] lab for [place].    Dr. Sallyanne Kuster recommends that you schedule a follow-up appointment in: One year.

## 2014-01-27 ENCOUNTER — Telehealth: Payer: Self-pay | Admitting: Cardiovascular Disease

## 2014-01-27 NOTE — Telephone Encounter (Signed)
Francis Sauer, do you have a status on release ID [phone]  MRN [id number] Sent by Lucilla Lame to you in July.

## 2014-03-09 ENCOUNTER — Telehealth: Payer: Self-pay | Admitting: Cardiovascular Disease

## 2014-03-09 NOTE — Telephone Encounter (Signed)
Received completed Disability form from [employer] @ [place].  Given to B Lassiter for Dr Sallyanne Kuster to sign.

## 2014-03-18 ENCOUNTER — Telehealth: Payer: Self-pay | Admitting: *Deleted

## 2014-03-18 NOTE — Telephone Encounter (Signed)
[facility] re: disability papers.

## 2014-03-26 ENCOUNTER — Telehealth: Payer: Self-pay | Admitting: *Deleted

## 2014-03-26 NOTE — Telephone Encounter (Signed)
Called both home and cell #'s.  No answer and voice mailbox has not been set up.  Need him to call to discuss disability papers.

## 2014-03-26 NOTE — Telephone Encounter (Signed)
Call both #'s.  Voiced mailboxes have not been set up.  Patient needs to contact me so that we can finish his disability papers.  I was able to leave a message on 03/18/14.

## 2014-04-12 ENCOUNTER — Other Ambulatory Visit: Payer: Self-pay | Admitting: Cardiovascular Disease

## 2014-04-13 NOTE — Telephone Encounter (Signed)
E sent to pharmacy 

## 2014-05-13 ENCOUNTER — Encounter (HOSPITAL_COMMUNITY): Payer: Self-pay | Admitting: Cardiology

## 2014-05-14 ENCOUNTER — Telehealth: Payer: Self-pay | Admitting: Cardiovascular Disease

## 2014-05-14 NOTE — Telephone Encounter (Signed)
Physcians Statement form received from [facility] @ [place].  Given to B Lassiter on 03/09/14 for Dr C to sign.  Was signed on 03/17/14.  B Lassiter unable to reach patient, left msg and called numerous times regarding question on Attending Physician Statement.  Never returned calls.  I as well tried to reach patient unsuccessfully.  Mailed completed and signed Attending Physician Statement to patient on 05/14/14. lp

## 2014-05-26 ENCOUNTER — Other Ambulatory Visit: Payer: Self-pay

## 2014-05-26 MED ORDER — TICAGRELOR 90 MG PO TABS: 90.0000 mg | ORAL_TABLET | Freq: Two times a day (BID) | ORAL | Status: DC

## 2014-05-26 MED ORDER — DILTIAZEM HCL ER COATED BEADS 120 MG PO CP24: 120.0000 mg | ORAL_CAPSULE | Freq: Every day | ORAL | Status: DC

## 2014-05-26 NOTE — Telephone Encounter (Signed)
Rx(s) sent to pharmacy electronically.  

## 2014-05-27 ENCOUNTER — Other Ambulatory Visit: Payer: Self-pay | Admitting: *Deleted

## 2014-05-27 MED ORDER — FENOFIBRATE 48 MG PO TABS: 48.0000 mg | ORAL_TABLET | Freq: Every day | ORAL | Status: DC

## 2014-05-27 NOTE — Telephone Encounter (Signed)
Rx(s) sent to pharmacy electronically.  

## 2014-06-10 ENCOUNTER — Encounter: Payer: BLUE CROSS/BLUE SHIELD | Attending: "Endocrinology | Admitting: Nutrition

## 2014-06-10 VITALS — Ht 68.0 in | Wt 193.0 lb

## 2014-06-10 DIAGNOSIS — E1165 Type 2 diabetes mellitus with hyperglycemia: Secondary | ICD-10-CM

## 2014-06-10 DIAGNOSIS — Z713 Dietary counseling and surveillance: Secondary | ICD-10-CM | POA: Diagnosis not present

## 2014-06-10 DIAGNOSIS — Z794 Long term (current) use of insulin: Secondary | ICD-10-CM | POA: Diagnosis not present

## 2014-06-10 DIAGNOSIS — IMO0002 Reserved for concepts with insufficient information to code with codable children: Secondary | ICD-10-CM

## 2014-06-10 DIAGNOSIS — E118 Type 2 diabetes mellitus with unspecified complications: Secondary | ICD-10-CM | POA: Diagnosis not present

## 2014-06-10 NOTE — Patient Instructions (Signed)
Goals:  Follow Diabetes Meal Plan as instructed  Eat 3 meals at consistent times B) 6-8 am L) 12-3 pm D)5-7 pm  Limit carbohydrate intake to 45 grams carbohydrate/meal  Avoid snacks between meals.  Cut out junk food  Increase fresh fruits and vegetables.  Increase water to 4 bottles of water per day  Add lean protein foods to meals/snacks  Monitor glucose levels in am and 2 hours after supper a few times per week.  Aim for 30 mins of physical activity daily  Bring food record and glucose log to your next nutrition visit Goal: Get A1C down to 6.5% and lose 1 lb per week.

## 2014-06-10 NOTE — Progress Notes (Signed)
  Medical Nutrition Therapy:  Appt start time: 0800 end time:  0900.   Assessment:  Primary concerns today: Diabetes.  He is here today with his wife, who is a diabetic also.  He has PMH of CVD and has had 1-2 heart attacks with stents put in. He eatsa lot of red meat but less than he use to. He and his wife eat pizza, chips, and and he use to drink soda but has cut that out. He only drinks water now. Has cut back on junk food and trying to eat healthier foods. His mom died a month ago and he was eating poorly for the last three months. He trying to get back on track now. He notes he is cuttingt back portions. Eats 2 meals per day sometimes. Not exercising much right now. Lost 40 lbs over the last three years. Most recent A1C was 8.1%. He is on Invokana, Lantus 60 units and 10 units of Novolog with meals plus sliding scale and Metformin. He records his blood sugars most of the time but not testing 100%.  He appears motivated to make further changes and get back on track with his diabetes and reducing risks for more cardiovascular events.  Preferred Learning Style:     No preference indicated   Learning Readiness:    Not ready  Contemplating  Ready  Change in progress   MEDICATIONS: See list   DIETARY INTAKE:  24-hr recall:  B ( AM): 1-2 eggs, bacon, OR Honey nut cheerrios, with 1% milk Snk ( AM): none or nabs  L ( PM): burger and fries, or sometimes skips Snk ( PM): none D ( PM): Fried chicken sandwich, fries, water  Snk ( PM): fruits, bananas Beverages: water, green tea, He admits to having eaten a lot of junk food while his mom was dying and got off track.  Usual physical activity: ADL   Estimated energy needs: 1800 calories 200 g carbohydrates 135 g protein 50 g fat  Progress Towards Goal(s):  In progress.   Nutritional Diagnosis:  NB-1.1 Food and nutrition-related knowledge deficit As related to Diabetes.  As evidenced by A1C 8.1%..    Intervention:  Nutrition  counseling and diabetes education provided on diabetes, diet, meal planning, CHO Counting, meal planning, portion sizes, target ranges for blood sugars, and prevention of complication and  Benefits of exercise. Stressed need for medication and diet compliance to  Reduce risk of more cardiovascular events. . Teaching Method Utilized:  Visual Auditory Hands on  Handouts given during visit include: The Plate Method Carb Counting and Food Label handouts Meal Plan Card . Marland KitchenGoals:  Follow Diabetes Meal Plan as instructed  Eat 3 meals at consistent times B) 6-8 am L) 12-3 pm D)5-7 pm  Limit carbohydrate intake to 45 grams carbohydrate/meal  Avoid snacks between meals.  Cut out junk food  Increase fresh fruits and vegetables.  Increase water to 4 bottles of water per day  Add lean protein foods to meals/snacks  Monitor glucose levels in am and 2 hours after supper a few times per week.  Aim for 30 mins of physical activity daily  Bring food record and glucose log to your next nutrition visit Goal: Get A1C down to 6.5% and lose 1 lb per week.  Barriers to learning/adherence to lifestyle change: noen  Demonstrated degree of understanding via:  Teach Back   Monitoring/Evaluation:  Dietary intake, exercise, meal planning, and body weight in 1 month(s).

## 2014-07-21 ENCOUNTER — Encounter: Payer: Self-pay | Admitting: Nutrition

## 2014-07-21 ENCOUNTER — Encounter: Payer: BLUE CROSS/BLUE SHIELD | Attending: "Endocrinology | Admitting: Nutrition

## 2014-07-21 VITALS — Ht 68.0 in | Wt 196.0 lb

## 2014-07-21 DIAGNOSIS — E118 Type 2 diabetes mellitus with unspecified complications: Secondary | ICD-10-CM | POA: Diagnosis present

## 2014-07-21 DIAGNOSIS — Z713 Dietary counseling and surveillance: Secondary | ICD-10-CM | POA: Insufficient documentation

## 2014-07-21 DIAGNOSIS — E1165 Type 2 diabetes mellitus with hyperglycemia: Secondary | ICD-10-CM

## 2014-07-21 DIAGNOSIS — Z794 Long term (current) use of insulin: Secondary | ICD-10-CM | POA: Diagnosis not present

## 2014-07-21 DIAGNOSIS — E663 Overweight: Secondary | ICD-10-CM

## 2014-07-21 DIAGNOSIS — IMO0002 Reserved for concepts with insufficient information to code with codable children: Secondary | ICD-10-CM

## 2014-07-21 NOTE — Patient Instructions (Signed)
Goals:  Follow Diabetes Meal Plan as instructed  Eat 3 meals about the same time every day.  Avoid snacks between meals.  Drink water and unsweet tea  Cut out late night snacks to improve morning blood sugars.  Choose carrots, celery sticks or other low carb veggies for a snack if needed.  Exercise 30 minutes 5 days per week.  Get A1C down to 7% in three months.  Lose 1 lb per week

## 2014-07-21 NOTE — Progress Notes (Signed)
  Medical Nutrition Therapy:  Appt start time: 0800 end time:  0830.  Assessment:  Primary concerns today: Diabetes follow up. FBS 116-166 Before lunch 86-140,90- 173. Occasionally forgets Lantus but less often now.. BS are improving. BS log brought with hiim today. Has cut down on sodas, cut down on bread, and increased fresh fruits and vegetables. Still active but not exercising intentionally yet. Going to buy an ellipical maybe today. Drinking more water and eating smaller portions. More accurate with meal patterns and taking meal time insulin consistently. Lantus 60 units and 10 units of Novolog with meals.  Gained 2 lbs which may be related to better BS control and consistent meals and taking insulin and lack of exercise OR heavier clothes he is wearing today due to weather.  Preferred Learning Style:     No preference indicated   Learning Readiness:    Ready  Change in progress   MEDICATIONS: See list   DIETARY INTAKE:  24-hr recall:  B ( AM): 1-2 eggs, bacon, OR Honey nut cheerrios, with 1% milk Snk ( AM): none or nabs  L ( PM): more baked and grilled foods. Less fast foods. Snk ( PM): none D ( PM): Mac/cheese, and fired potatoes,   Snk ( PM): fruits, bananas Beverages: water, green tea,  Usual physical activity: ADL   Estimated energy needs: 1800 calories 200 g carbohydrates 135 g protein 50 g fat  Progress Towards Goal(s):  In progress.   Nutritional Diagnosis:  NB-1.1 Food and nutrition-related knowledge deficit As related to Diabetes.  As evidenced by A1C 8.1%..    Intervention:  Nutrition counseling and diabetes education provided on diabetes, diet, meal planning, CHO Counting, meal planning, portion sizes, target ranges for blood sugars, and prevention of complication and  Benefits of exercise. Stressed need for medication and diet compliance to  Reduce risk of more cardiovascular events. . Goals:  Follow Diabetes Meal Plan as instructed  Eat 3  meals about the same time every day.  Avoid snacks between meals.  Drink water and unsweet tea  Cut out late night snacks to improve morning blood sugars.  Choose carrots, celery sticks or other low carb veggies for a snack if needed.  Exercise 30 minutes 5 days per week.  Get A1C down to 7% in three months.  Lose 1 lb per week.   Teaching Method Utilized:  Visual Auditory Hands on  Handouts given during visit include: The Plate Method Carb Counting and Food Label handouts Meal Plan Card   Barriers to learning/adherence to lifestyle change: none  Demonstrated degree of understanding via:  Teach Back   Monitoring/Evaluation:  Dietary intake, exercise, meal planning, and body weight in 1 month(s).

## 2014-09-01 ENCOUNTER — Encounter: Payer: BLUE CROSS/BLUE SHIELD | Attending: "Endocrinology | Admitting: Nutrition

## 2014-09-01 VITALS — Ht 68.0 in | Wt 195.0 lb

## 2014-09-01 DIAGNOSIS — E1165 Type 2 diabetes mellitus with hyperglycemia: Secondary | ICD-10-CM

## 2014-09-01 DIAGNOSIS — Z794 Long term (current) use of insulin: Secondary | ICD-10-CM | POA: Insufficient documentation

## 2014-09-01 DIAGNOSIS — E118 Type 2 diabetes mellitus with unspecified complications: Secondary | ICD-10-CM | POA: Insufficient documentation

## 2014-09-01 DIAGNOSIS — E669 Obesity, unspecified: Secondary | ICD-10-CM | POA: Insufficient documentation

## 2014-09-01 DIAGNOSIS — Z6829 Body mass index (BMI) 29.0-29.9, adult: Secondary | ICD-10-CM | POA: Diagnosis not present

## 2014-09-01 DIAGNOSIS — Z713 Dietary counseling and surveillance: Secondary | ICD-10-CM | POA: Diagnosis not present

## 2014-09-01 DIAGNOSIS — IMO0002 Reserved for concepts with insufficient information to code with codable children: Secondary | ICD-10-CM

## 2014-09-01 NOTE — Patient Instructions (Addendum)
Goals:  Follow the Plate Method  Increase lower carb vegetables.  Lose 1 lb per week  Exercise 30 minutes daily  Keep A1C 6.5% or lower

## 2014-09-01 NOTE — Progress Notes (Signed)
  Medical Nutrition Therapy:  Appt start time: 0900 end time:  0930  Assessment:  Primary concerns today: Diabetes follow up. He has been watching his carbs a lot more. Meals more consistent and eating on time. Take his medicines as prescribed.. A1C was 6.4% down from >8%. Lantus 50 units daily and Humalog 10 units with meals. Getting a lot more exercise now that the weather is better. He needs more consistent low carb vegetables in his diet. Usually gets them in daily he reports. Lost 1 lb since last visit.  Preferred Learning Style:     No preference indicated   Learning Readiness:    Ready  Change in progress   MEDICATIONS: See list   DIETARY INTAKE:  24-hr recall:  B ( AM): 1-2 eggs, and cereal bar. Snk ( AM): none or nabs  L ( PM): Sandwich with small order order fries. Snk ( PM): none D ( PM): Kix cereal with 1% milk,  Snk ( PM): Beverages: water, green tea,  Usual physical activity: ADL   Estimated energy needs: 1800 calories 200 g carbohydrates 135 g protein 50 g fat  Progress Towards Goal(s):  In progress.   Nutritional Diagnosis:  NB-1.1 Food and nutrition-related knowledge deficit As related to Diabetes.  As evidenced by A1C 8.1%..    Intervention:  Reviewed meal planning and target goals and benefits of exercise for needed weight loss.  Goals:  Follow the Plate Method  Increase lower carb vegetables.  Lose 1 lb per week  Exercise 30 minutes daily  Keep A1C 6.5% or lower.    Teaching Method Utilized:  Visual Auditory Hands on  Handouts given during visit include: The Plate Method Carb Counting and Food Label handouts Meal Plan Card  Barriers to learning/adherence to lifestyle change: none  Demonstrated degree of understanding via:  Teach Back   Monitoring/Evaluation:  Dietary intake, exercise, meal planning, and body weight in 3 month(s).

## 2014-09-09 ENCOUNTER — Telehealth: Payer: Self-pay | Admitting: Cardiovascular Disease

## 2014-09-09 MED ORDER — TICAGRELOR 90 MG PO TABS: 90.0000 mg | ORAL_TABLET | Freq: Two times a day (BID) | ORAL | Status: DC

## 2014-09-09 MED ORDER — OMEGA-3-ACID ETHYL ESTERS 1 G PO CAPS: 2.0000 g | ORAL_CAPSULE | Freq: Two times a day (BID) | ORAL | Status: DC

## 2014-09-09 MED ORDER — DILTIAZEM HCL ER COATED BEADS 120 MG PO CP24: 120.0000 mg | ORAL_CAPSULE | Freq: Every day | ORAL | Status: DC

## 2014-09-09 NOTE — Telephone Encounter (Signed)
Meds refilled, LMOM for patient.

## 2014-09-09 NOTE — Telephone Encounter (Signed)
°  1. Which medications need to be refilled?Cartia XT, Brilinta,Omega 3   2. Which pharmacy is medication to be sent to?[facility] needs new prescriptions sent .[name] [phone]  3. Do they need a 30 day or 90 day supply? 90   4. Would they like a call back once the medication has been sent to the pharmacy? Yes can leave message

## 2014-10-12 ENCOUNTER — Other Ambulatory Visit: Payer: Self-pay

## 2014-10-12 MED ORDER — FENOFIBRATE 48 MG PO TABS: 48.0000 mg | ORAL_TABLET | Freq: Every day | ORAL | Status: DC

## 2014-10-12 NOTE — Telephone Encounter (Signed)
Rx(s) sent to pharmacy electronically.  

## 2014-11-07 IMAGING — CR DG CHEST 2V
2 series · 2 of 2 positions shown · non-contrast
Comparison: 11/14/2009

CLINICAL DATA: Shortness of breath after the stenting yesterday.
Chest pressure.

CHEST - 2 VIEW

[view not recorded (1 of 2)]
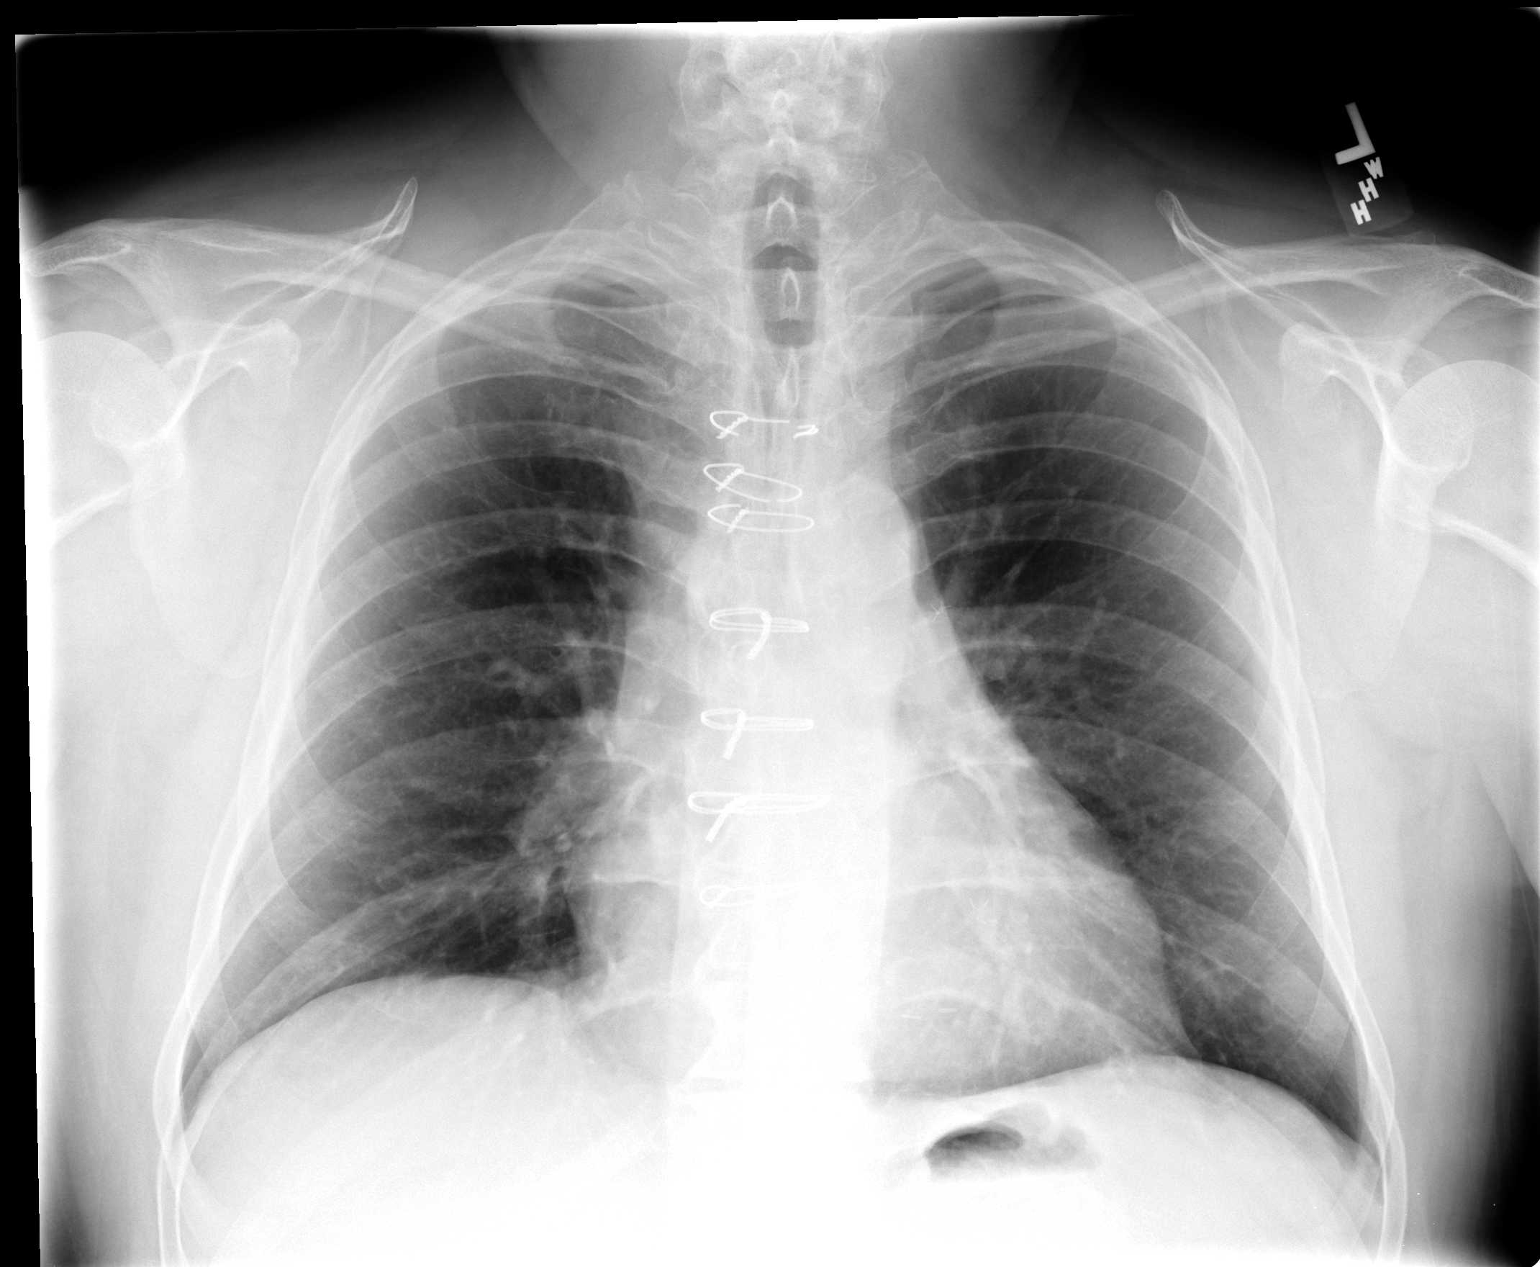

[view not recorded (2 of 2)]
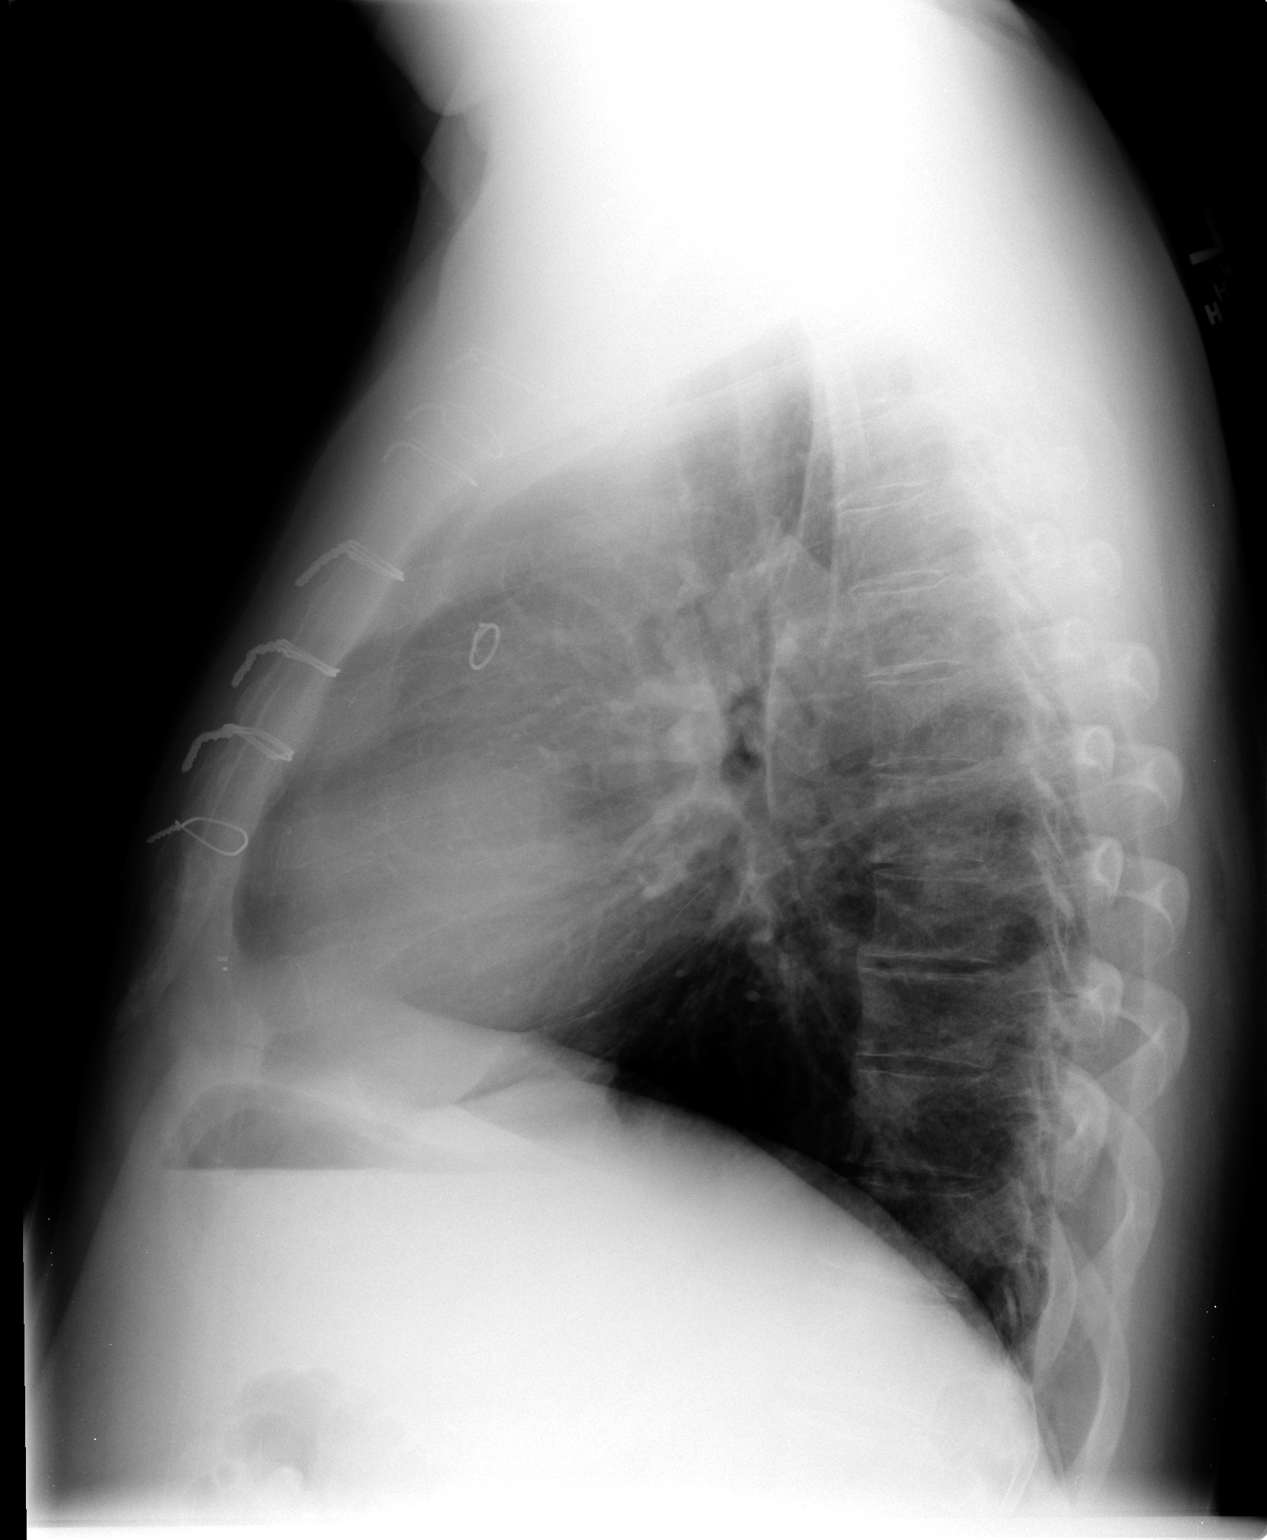

[2 of 2 positions shown; findings below may reference images not displayed]

FINDINGS: Stable appearance of postoperative changes in the
mediastinum. The heart size and pulmonary vascularity are normal.
The lungs appear clear and expanded without focal air space disease
or consolidation. No blunting of the costophrenic angles.  No
pneumothorax.  Mediastinal contours appear intact.  Mild
degenerative changes in the thoracic spine.  No significant change
since previous study.
IMPRESSION: No evidence of active pulmonary disease.

## 2014-12-02 ENCOUNTER — Encounter: Payer: Self-pay | Admitting: Nutrition

## 2014-12-02 ENCOUNTER — Encounter: Payer: BLUE CROSS/BLUE SHIELD | Attending: "Endocrinology | Admitting: Nutrition

## 2014-12-02 VITALS — Ht 68.0 in | Wt 192.0 lb

## 2014-12-02 DIAGNOSIS — E109 Type 1 diabetes mellitus without complications: Secondary | ICD-10-CM | POA: Diagnosis present

## 2014-12-02 DIAGNOSIS — Z794 Long term (current) use of insulin: Secondary | ICD-10-CM | POA: Diagnosis not present

## 2014-12-02 DIAGNOSIS — Z713 Dietary counseling and surveillance: Secondary | ICD-10-CM | POA: Diagnosis not present

## 2014-12-02 DIAGNOSIS — Z6829 Body mass index (BMI) 29.0-29.9, adult: Secondary | ICD-10-CM | POA: Insufficient documentation

## 2014-12-02 NOTE — Progress Notes (Signed)
  Medical Nutrition Therapy:  Appt start time: 0930 end time:  0945  Assessment:  Primary concerns today: Diabetes follow up     Not eating meals on schedule. Occasionally forgets his insulin and night time meds. He admits to knowing how to eat and what to do, just needs to do it. 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 A1C 6.7%, up from 6.4% last visit three months ago. C Peptide checked 0.7.  He may have LADA.  He takes 50 units of Lantus and 10 units of Humalog with meals plus sliding scale. Here to see Dr. Dorris Fetch today. No changes in insulin regimen.  He needs more consistent meals and medication management.  Lost 3 lbs since last visit.  Preferred Learning Style:     No preference indicated   Learning Readiness:    Ready  Change in progress   MEDICATIONS: See list   DIETARY INTAKE:  24-hr recall:  UP and down with eating a habits due to his busy schedule of working on his house etc. Knows he needs more fresh fruits, whole grains and lower carb vegetables. Eating late at night and skips meals which causes him to overeat at certain meals and therefore impacting his blood sugars.  Usual physical activity: Works on farm.  Estimated energy needs: 1800 calories 200 g carbohydrates 135 g protein 50 g fat  Progress Towards Goal(s):  In progress.   Nutritional Diagnosis:  NB-1.1 Food and nutrition-related knowledge deficit As related to Diabetes.  As evidenced by A1C 8.1%..    Intervention:  Reviewed meal planning and target goals and benefits of exercise for needed weight loss. Discussed complications of DM and need for balanced meals at scheduled times and not skipping medications or meals to improve DM control.  Goals:  Follow the Plate Method  Do not skip meals.   Eat meals at balanced times.  Increase fresh fruits and lower carb vegetables.  Do not forgot to take insulin with meals and bedtime.  Keep A1C 6.5% or  less.    Teaching Method Utilized:  Visual Auditory Hands on  Handouts given during visit include: The Plate Method Carb Counting and Food Label handouts Meal Plan Card  Barriers to learning/adherence to lifestyle change: none  Demonstrated degree of understanding via:  Teach Back   Monitoring/Evaluation:  Dietary intake, exercise, meal planning, and body weight in PRN. If he needs follow up, he can call to schedule appointment.

## 2014-12-02 NOTE — Patient Instructions (Signed)
Goals:  Follow the Plate Method  Do not skip meals.   Eat meals at balanced times.  Increase fresh fruits and lower carb vegetables.  Do not forgot to take insulin with meals and bedtime.  Keep A1C 6.5% or less.

## 2015-01-10 ENCOUNTER — Ambulatory Visit (INDEPENDENT_AMBULATORY_CARE_PROVIDER_SITE_OTHER): Payer: BLUE CROSS/BLUE SHIELD | Admitting: Cardiovascular Disease

## 2015-01-10 ENCOUNTER — Encounter: Payer: Self-pay | Admitting: Cardiovascular Disease

## 2015-01-10 ENCOUNTER — Other Ambulatory Visit: Payer: Self-pay | Admitting: *Deleted

## 2015-01-10 VITALS — BP 158/82 | HR 61 | Resp 16 | Ht 68.0 in | Wt 193.7 lb

## 2015-01-10 DIAGNOSIS — I25118 Atherosclerotic heart disease of native coronary artery with other forms of angina pectoris: Secondary | ICD-10-CM

## 2015-01-10 DIAGNOSIS — I10 Essential (primary) hypertension: Secondary | ICD-10-CM

## 2015-01-10 DIAGNOSIS — I25708 Atherosclerosis of coronary artery bypass graft(s), unspecified, with other forms of angina pectoris: Secondary | ICD-10-CM

## 2015-01-10 DIAGNOSIS — E785 Hyperlipidemia, unspecified: Secondary | ICD-10-CM | POA: Diagnosis not present

## 2015-01-10 MED ORDER — FENOFIBRATE 48 MG PO TABS: 48.0000 mg | ORAL_TABLET | Freq: Every day | ORAL | Status: DC

## 2015-01-10 NOTE — Progress Notes (Signed)
Patient ID: Francis Hall, male   DOB: 1956-02-20, 58 y.o.   MRN: [id number]     Cardiology Office Note   Date:  01/11/2015   ID:  Francis Hall, DOB 02-02-1956, MRN [id number]  PCP:  Purvis Kilts, MD  Cardiologist:   Sanda Klein, MD   Chief Complaint  Patient presents with  . Annual Exam    patient reports some dizziness-r/t squating down and standing back up quickly, otherwise, no complaints.      History of Present Illness: Francis Hall is a 59 y.o. male who presents for coronary artery disease with extensive history of previous surgical and percutaneous revascularization, ischemic cardiomyopathy, chronic (predominantly diastolic) heart failure, type 2 diabetes mellitus requiring insulin, obesity, mixed hyperlipidemia with severe hypertriglyceridemia.  Since his last appointment he has not had any new cardiac events. He denies angina pectoris at rest or with exertion. He is relatively sedentary, as always but denies any dyspnea with activity that he does perform. He has developed fairly significant lower extremity edema. He has not been taking his hydrochlorothiazide since he thought it was the medicine to be taken "as needed" for his swelling and did not notice that it led to any significant diuresis. He is reasonably compliant with a low sodium diet but does not weigh himself on a daily basis.  Glycemic control has improved with a hemoglobin A1c that is drop from 8.3 to 6.7 under the care of Dr. Dorris Fetch.    Past Medical History  Diagnosis Date  . Hyperplastic colon polyp 07/16/07    30cm   . Diabetes mellitus     insulin  . CAD (coronary artery disease)     [facility]  . Hyperlipemia   . HTN (hypertension)   . Family hx of colon cancer   . Diverticulosis   . Atrial fibrillation   . CHF (congestive heart failure) 11/08/2010    2D Echo EF=>55%  . Dyspnea   . OSA (obstructive sleep apnea) 12/21/2007    sleep study perform REM 41mins REM and NREM was 95.0%, AHI of  12.31hr and RDI of 12.4hr  . Myocardial infarction 12/2012    Past Surgical History  Procedure Laterality Date  . Coronary artery bypass graft  06/2004    x2 performed for 40% left main stenosis, 80% proximal LAD stenosis of the first diagonal artery, and 50% left circumfles artery priximal stenosis.LIMA to LAD and free radial artery to circumflex coronary artery  . Tonsillectomy  1962  . Colonoscopy  07/2007    Dr Arnoldo Morale: hyperplastic polyp 30cm  . Colonoscopy N/A 08/12/2012    Procedure: COLONOSCOPY;  Surgeon: Danie Binder, MD;  Location: [facility];  Service: Endoscopy;  Laterality: N/A;  8:30  . Cardiac catheterization  2006 2011    2 stents  . Cataract extraction w/phaco Left 12/03/2013    Procedure: CATARACT EXTRACTION PHACO AND INTRAOCULAR LENS PLACEMENT (IOC);  Surgeon: Tonny Branch, MD;  Location: [facility] ORS;  Service: Ophthalmology;  Laterality: Left;  CDE:7.07  . Cataract extraction w/phaco Right 12/14/2013    Procedure: CATARACT EXTRACTION PHACO AND INTRAOCULAR LENS PLACEMENT (IOC);  Surgeon: Tonny Branch, MD;  Location: [facility] OR[facility];  Service: Ophthalmology;  Laterality: Right;  CDE 7.84   . Left heart catheterization with coronary/graft angiogram  12/29/2012    Procedure: LEFT HEART CATHETERIZATION WITH Beatrix Fetters;  Surgeon: Leonie Man, MD;  Location: [facility] CATH LAB;  Service: Cardiovascular;;     Current Outpatient Prescriptions  Medication Sig Dispense Refill  .  acetaminophen (TYLENOL) 325 MG tablet Take 2 tablets (650 mg total) by mouth every 4 (four) hours as needed.    [name] aspirin 81 MG EC tablet Take 81 mg by mouth daily.      [name] atorvastatin (LIPITOR) 20 MG tablet Take 20 mg by mouth at bedtime.    . Canagliflozin (INVOKANA) 300 MG TABS Take 300 mg by mouth daily.    . carvedilol (COREG) 25 MG tablet Take 25 mg by mouth 2 (two) times daily with a meal.      . Cholecalciferol (VITAMIN D3) 1000 UNITS CAPS Take 2 capsules by mouth daily.    [name] diltiazem (CARTIA XT) 120  MG 24 hr capsule Take 1 capsule (120 mg total) by mouth daily. 90 capsule 1  . hydrochlorothiazide (MICROZIDE) 12.5 MG capsule Take 1 capsule (12.5 mg total) by mouth daily as needed. 30 capsule 3  . insulin glargine (LANTUS) 100 UNIT/ML injection Inject 60 Units into the skin at bedtime.     [name] lisinopril (PRINIVIL,ZESTRIL) 20 MG tablet Take 20 mg by mouth 2 (two) times daily.    . metFORMIN (GLUCOPHAGE) 1000 MG tablet Take 1 tablet (1,000 mg total) by mouth 2 (two) times daily with a meal. 180 tablet 3  . Multiple Vitamin (MULTIVITAMIN) capsule Take 1 capsule by mouth daily.      . nitroGLYCERIN (NITROSTAT) 0.4 MG SL tablet Place 0.4 mg under the tongue every 5 (five) minutes as needed for chest pain.    [name] NITROSTAT 0.4 MG SL tablet PLACE 1 TABLET UNDER TONGUE IF NEEDED FOR CHEST PAIN..MAX OF 3 DOSES 25 tablet 3  . NOVOLOG FLEXPEN 100 UNIT/ML FlexPen Inject 10-13 Units into the skin 3 (three) times daily with meals. per sliding scale  2  . omega-3 acid ethyl esters (LOVAZA) 1 G capsule Take 2 capsules (2 g total) by mouth 2 (two) times daily. 360 capsule 1  . ticagrelor (BRILINTA) 90 MG TABS tablet Take 1 tablet (90 mg total) by mouth 2 (two) times daily. 180 tablet 1  . vitamin C (ASCORBIC ACID) 500 MG tablet Take 500 mg by mouth daily.    . fenofibrate (TRICOR) 48 MG tablet Take 1 tablet (48 mg total) by mouth daily. 90 tablet 3   No current facility-administered medications for this visit.    Allergies:   Crestor    Social History:  The patient  reports that he has never smoked. He has never used smokeless tobacco. He reports that he drinks alcohol. He reports that he does not use illicit drugs.   Family History:  The patient's family history includes Arthritis in an other family member; Colon cancer in his father; Coronary artery disease (age of onset: 72) in his father; Diabetes in an other family member; Heart disease in an other family member; Lung cancer in his father.    ROS:   Please see the history of present illness.    Otherwise, review of systems positive for none.   All other systems are reviewed and negative.    PHYSICAL EXAM: VS:  BP 158/82 mmHg  Pulse 61  Resp 16  Ht 5\' 8"  (1.727 m)  Wt 193 lb 11.2 oz (87.862 kg)  BMI 29.46 kg/m2 , BMI Body mass index is 29.46 kg/(m^2).  General: Alert, oriented x3, no distress Head: no evidence of trauma, PERRL, EOMI, no exophtalmos or lid lag, no myxedema, no xanthelasma; normal ears, nose and oropharynx Neck: normal jugular venous pulsations and no hepatojugular reflux; brisk carotid  pulses without delay and no carotid bruits Chest: clear to auscultation, no signs of consolidation by percussion or palpation, normal fremitus, symmetrical and full respiratory excursions Cardiovascular: normal position and quality of the apical impulse, regular rhythm, normal first and second heart sounds, no  murmurs, rubs or gallops Abdomen: no tenderness or distention, no masses by palpation, no abnormal pulsatility or arterial bruits, normal bowel sounds, no hepatosplenomegaly Extremities: no clubbing, cyanosis; 2+  metrical ankle and pretibial edema; 2+ radial, ulnar and brachial pulses bilaterally; 2+ right femoral, posterior tibial and dorsalis pedis pulses; 2+ left femoral, posterior tibial and dorsalis pedis pulses; no subclavian or femoral bruits Neurological: grossly nonfocal Psych: euthymic mood, full affect   EKG:  EKG is ordered today. The ekg ordered today demonstrates sinus rhythm with a single PAC, QTC 396 ms  Lipid Panel    Component Value Date/Time   CHOL 175 12/29/2012 0438   TRIG 574* 12/29/2012 0438   HDL 25* 12/29/2012 0438   CHOLHDL 7.0 12/29/2012 0438   VLDL UNABLE TO CALCULATE IF TRIGLYCERIDE OVER 400 mg/dL 12/29/2012 0438   LDLCALC UNABLE TO CALCULATE IF TRIGLYCERIDE OVER 400 mg/dL 12/29/2012 0438      Wt Readings from Last 3 Encounters:  01/10/15 193 lb 11.2 oz (87.862 kg)  12/02/14 192 lb  (87.091 kg)  09/01/14 195 lb (88.451 kg)      ASSESSMENT AND PLAN:  CAD (coronary artery disease), Hx CABG 2006, s/p PCI on long mid RCA lesions and distal RCA-RPLA lesion with PTCA of the ostial-proximal RPDA 12/29/12  Has remained asymptomatic. Will continue to focus on risk factor reduction. Patient to continue to attempt to lose weight. Will continue the patient on brilanta in favor of plavix due to his platelet inhibition testing being elevated while on plavix. Discussed symptoms of recurrent angina. Will see in follow-up in one year. High risk for recurrent coronary events. Keep on platelet inhibition lifelong. Note that he was a Plavix nonresponder.  2 years have passed since his last percutaneous revascularization with drug-eluting stents (proximal posterolateral artery 2.25 x 16 mm Promus and mid right coronary artery 2.5 x 38 mm Promus) after a small non-ST segment elevation myocardial infarction July 2014). I think we have escaped the possibility of recurrent in-stent restenosis by now.  Hyperlipidemia Patient has done well on lipitor and tricor. He requires the fibroid for severe hypertriglyceridemia, not as a simple add onto his statin. Triglycerides remained elevated even once good glycemic control was obtained. Labs most recently performed by his Endocrinologist. We will request these labs from Dr. Dorris Fetch. He is to continue lipitor and tricor.  HTN (hypertension) Insufficiently controlled. Resume hydrochlorothiazide as a daily medication. Discussed with him that this is an anti-hypertensive not a medication for immediate relief of edema. Occasional dizziness is probably secondary to orthostatic hypotension due to combination of diabetes and multiple medications.    Current medicines are reviewed at length with the patient today.  The patient does not have concerns regarding medicines.   Labs/ tests ordered today include:  Orders Placed This Encounter  Procedures  . EKG 12-Lead     Patient Instructions  Your physician wants you to follow-up in: [place] will receive a reminder letter in the mail two months in advance. If you don't receive a letter, please call our office to schedule the follow-up appointment.   TAKE HYDROCHLOROTHIAZIDE ONCE DAILY      Signed, Tirzah Fross, MD  01/11/2015 5:16 PM    Manette Doto,  MD, [facility] [phone] office [phone] pager

## 2015-01-10 NOTE — Patient Instructions (Signed)
Your physician wants you to follow-up in: [place] will receive a reminder letter in the mail two months in advance. If you don't receive a letter, please call our office to schedule the follow-up appointment.   TAKE HYDROCHLOROTHIAZIDE ONCE DAILY

## 2015-01-11 ENCOUNTER — Encounter: Payer: Self-pay | Admitting: Cardiovascular Disease

## 2015-01-12 ENCOUNTER — Other Ambulatory Visit: Payer: Self-pay | Admitting: *Deleted

## 2015-01-12 MED ORDER — FENOFIBRATE 48 MG PO TABS: 48.0000 mg | ORAL_TABLET | Freq: Every day | ORAL | Status: DC

## 2015-01-31 ENCOUNTER — Other Ambulatory Visit: Payer: Self-pay | Admitting: Cardiovascular Disease

## 2015-01-31 MED ORDER — HYDROCHLOROTHIAZIDE 12.5 MG PO CAPS: 12.5000 mg | ORAL_CAPSULE | Freq: Every day | ORAL | Status: DC | PRN

## 2015-01-31 MED ORDER — TICAGRELOR 90 MG PO TABS: 90.0000 mg | ORAL_TABLET | Freq: Two times a day (BID) | ORAL | Status: DC

## 2015-01-31 MED ORDER — DILTIAZEM HCL ER COATED BEADS 120 MG PO CP24: 120.0000 mg | ORAL_CAPSULE | Freq: Every day | ORAL | Status: DC

## 2015-01-31 NOTE — Telephone Encounter (Signed)
°  1. Which medications need to be refilled? HCTZ, Brilinta, Diltiazem  2. Which pharmacy is medication to be sent to?[facility] in [place]   3. Do they need a 30 day or 90 day supply? 30(for partial) 90( regular prescription)   4. Would they like a call back once the medication has been sent to the pharmacy? YEs

## 2015-01-31 NOTE — Telephone Encounter (Signed)
Rx(s) sent to pharmacy electronically. Patient notified. 

## 2015-02-13 LAB — HM DIABETES EYE EXAM

## 2015-03-03 ENCOUNTER — Other Ambulatory Visit: Payer: Self-pay | Admitting: "Endocrinology

## 2015-03-07 ENCOUNTER — Other Ambulatory Visit: Payer: Self-pay | Admitting: Cardiovascular Disease

## 2015-03-07 NOTE — Telephone Encounter (Signed)
REFILL 

## 2015-03-09 LAB — COMPREHENSIVE METABOLIC PANEL
A/G RATIO: 1.9 (ref 1.1–2.5)
ALBUMIN: 4.5 g/dL (ref 3.5–5.5)
ALT: 17 IU/L (ref 0–44)
AST: 15 IU/L (ref 0–40)
Alkaline Phosphatase: 66 IU/L (ref 39–117)
BUN / CREAT RATIO: 26 — AB (ref 9–20)
BUN: 28 mg/dL — ABNORMAL HIGH (ref 6–24)
Bilirubin Total: 0.5 mg/dL (ref 0.0–1.2)
CALCIUM: 9.4 mg/dL (ref 8.7–10.2)
CO2: 21 mmol/L (ref 18–29)
Chloride: 101 mmol/L (ref 97–108)
Creatinine, Ser: 1.09 mg/dL (ref 0.76–1.27)
GFR, EST AFRICAN AMERICAN: 87 mL/min/{1.73_m2} (ref >59)
GFR, EST NON AFRICAN AMERICAN: 75 mL/min/{1.73_m2} (ref >59)
GLOBULIN, TOTAL: 2.4 g/dL (ref 1.5–4.5)
Glucose: 120 mg/dL — ABNORMAL HIGH (ref 65–99)
POTASSIUM: 4.7 mmol/L (ref 3.5–5.2)
SODIUM: 139 mmol/L (ref 134–144)
TOTAL PROTEIN: 6.9 g/dL (ref 6.0–8.5)

## 2015-03-09 LAB — HGB A1C W/O EAG: HEMOGLOBIN A1C: 6.5 % — AB (ref 4.8–5.6)

## 2015-03-09 LAB — IA-2 AUTOANTIBODIES: IA-2 Autoantibodies: <1 U/mL

## 2015-03-09 LAB — INSULIN ANTIBODIES, BLOOD: Insulin AutoAb: <5 uU/mL

## 2015-03-09 LAB — GLUTAMIC ACID DECARBOXYLASE AUTO ABS: Glutamic Acid Decarb Ab: <5 U/mL (ref 0.0–5.0)

## 2015-03-10 ENCOUNTER — Ambulatory Visit: Payer: Self-pay | Admitting: "Endocrinology

## 2015-03-15 ENCOUNTER — Ambulatory Visit (INDEPENDENT_AMBULATORY_CARE_PROVIDER_SITE_OTHER): Payer: BLUE CROSS/BLUE SHIELD | Admitting: "Endocrinology

## 2015-03-15 ENCOUNTER — Encounter: Payer: Self-pay | Admitting: "Endocrinology

## 2015-03-15 VITALS — BP 144/73 | HR 65 | Ht 68.0 in | Wt 191.0 lb

## 2015-03-15 DIAGNOSIS — I10 Essential (primary) hypertension: Secondary | ICD-10-CM | POA: Diagnosis not present

## 2015-03-15 DIAGNOSIS — E785 Hyperlipidemia, unspecified: Secondary | ICD-10-CM | POA: Diagnosis not present

## 2015-03-15 DIAGNOSIS — E1159 Type 2 diabetes mellitus with other circulatory complications: Secondary | ICD-10-CM | POA: Diagnosis not present

## 2015-03-15 NOTE — Patient Instructions (Signed)
Advice for weight management -For most of [place] the best way to lose weight is by diet management. Generally speaking, diet management means restricting carbohydrate consumption to minimum possible (and to unprocessed or minimally processed complex starch) and increasing protein intake (animal or plant source), fruits, and vegetables.  -Sticking to a routine mealtime to eat 3 meals a day and avoiding unnecessary snacks is shown to have a big role in weight control.  -It is better to avoid simple carbohydrates including: Cakes, Desserts, Ice Cream, Soda (diet and regular), Sweet Tea, Candies, Chips, Cookies, Artificial Sweeteners, and "Sugar-free" Products.   -Exercise: 30 minutes a day 3-4 days a week, or 150 minutes a week. Combine stretch, strength, and aerobic activities. You may seek evaluation by your heart doctor prior to initiating exercise if you have high risk for heart disease.  -Continue your  visit with Jearld Fenton, RDN, CDE for individualized nutrition education.

## 2015-03-15 NOTE — Progress Notes (Signed)
Subjective:    Patient ID: Francis Hall, male    DOB: September 29, 1955,    Past Medical History  Diagnosis Date  . Hyperplastic colon polyp 07/16/07    30cm   . Diabetes mellitus     insulin  . CAD (coronary artery disease)     [facility]  . Hyperlipemia   . HTN (hypertension)   . Family hx of colon cancer   . Diverticulosis   . Atrial fibrillation ([place])   . CHF (congestive heart failure) ([place]) 11/08/2010    2D Echo EF=>55%  . Dyspnea   . OSA (obstructive sleep apnea) 12/21/2007    sleep study perform REM 70mins REM and NREM was 95.0%, AHI of 12.31hr and RDI of 12.4hr  . Myocardial infarction [facility]) 12/2012   Past Surgical History  Procedure Laterality Date  . Coronary artery bypass graft  06/2004    x2 performed for 40% left main stenosis, 80% proximal LAD stenosis of the first diagonal artery, and 50% left circumfles artery priximal stenosis.LIMA to LAD and free radial artery to circumflex coronary artery  . Tonsillectomy  1962  . Colonoscopy  07/2007    Dr Arnoldo Morale: hyperplastic polyp 30cm  . Colonoscopy N/A 08/12/2012    Procedure: COLONOSCOPY;  Surgeon: Danie Binder, MD;  Location: [facility] ENDO SUITE;  Service: Endoscopy;  Laterality: N/A;  8:30  . Cardiac catheterization  2006 2011    2 stents  . Cataract extraction w/phaco Left 12/03/2013    Procedure: CATARACT EXTRACTION PHACO AND INTRAOCULAR LENS PLACEMENT (IOC);  Surgeon: Tonny Branch, MD;  Location: [facility] ORS;  Service: Ophthalmology;  Laterality: Left;  CDE:7.07  . Cataract extraction w/phaco Right 12/14/2013    Procedure: CATARACT EXTRACTION PHACO AND INTRAOCULAR LENS PLACEMENT (IOC);  Surgeon: Tonny Branch, MD;  Location: [facility] ORS;  Service: Ophthalmology;  Laterality: Right;  CDE 7.84   . Left heart catheterization with coronary/graft angiogram  12/29/2012    Procedure: LEFT HEART CATHETERIZATION WITH Beatrix Fetters;  Surgeon: Leonie Man, MD;  Location: [facility] CATH LAB;  Service: Cardiovascular;;   Social History    Social History  . Marital Status: Married    Spouse Name: N/A  . Number of Children: 2  . Years of Education: 12   Occupational History  . disabled Dealer     Social History Main Topics  . Smoking status: Never Smoker   . Smokeless tobacco: Never Used  . Alcohol Use: Yes     Comment: social-2 beers/wk  . Drug Use: No  . Sexual Activity: Yes    Birth Control/ Protection: None   Other Topics Concern  . None   Social History Narrative   Outpatient Encounter Prescriptions as of 03/15/2015  Medication Sig  . acetaminophen (TYLENOL) 325 MG tablet Take 2 tablets (650 mg total) by mouth every 4 (four) hours as needed.  [name] aspirin 81 MG EC tablet Take 81 mg by mouth daily.    [name] atorvastatin (LIPITOR) 20 MG tablet Take 20 mg by mouth at bedtime.  [name] BRILINTA 90 MG TABS tablet TAKE 1 TABLET (90 MG TOTAL) BY MOUTH 2 (TWO) TIMES DAILY.  . Canagliflozin (INVOKANA) 300 MG TABS Take 300 mg by mouth daily.  . carvedilol (COREG) 25 MG tablet Take 25 mg by mouth 2 (two) times daily with a meal.    . Cholecalciferol (VITAMIN D3) 1000 UNITS CAPS Take 2 capsules by mouth daily.  [name] diltiazem (CARTIA XT) 120 MG 24 hr capsule Take 1 capsule (120  mg total) by mouth daily.  . fenofibrate (TRICOR) 48 MG tablet Take 1 tablet (48 mg total) by mouth daily.  . hydrochlorothiazide (MICROZIDE) 12.5 MG capsule TAKE 1 CAPSULE (12.5 MG TOTAL) BY MOUTH DAILY AS NEEDED.  [name] insulin glargine (LANTUS) 100 UNIT/ML injection Inject 60 Units into the skin at bedtime.   [name] lisinopril (PRINIVIL,ZESTRIL) 20 MG tablet Take 20 mg by mouth 2 (two) times daily.  . metFORMIN (GLUCOPHAGE) 1000 MG tablet Take 1 tablet (1,000 mg total) by mouth 2 (two) times daily with a meal.  . Multiple Vitamin (MULTIVITAMIN) capsule Take 1 capsule by mouth daily.    . nitroGLYCERIN (NITROSTAT) 0.4 MG SL tablet Place 0.4 mg under the tongue every 5 (five) minutes as needed for chest pain.  [name] NITROSTAT 0.4 MG SL tablet PLACE 1 TABLET UNDER  TONGUE IF NEEDED FOR CHEST PAIN..MAX OF 3 DOSES  . NOVOLOG FLEXPEN 100 UNIT/ML FlexPen Inject 10-13 Units into the skin 3 (three) times daily with meals. per sliding scale  . omega-3 acid ethyl esters (LOVAZA) 1 G capsule Take 2 capsules (2 g total) by mouth 2 (two) times daily.  . vitamin C (ASCORBIC ACID) 500 MG tablet Take 500 mg by mouth daily.   No facility-administered encounter medications on file as of 03/15/2015.   ALLERGIES: Allergies  Allergen Reactions  . Crestor [Rosuvastatin] Other (See Comments)    myalgia   VACCINATION STATUS:  There is no immunization history on file for this patient.  Diabetes He presents for his follow-up diabetic visit. He has type 2 diabetes mellitus. Onset time: He was diagnosed at approximate age of 82 years. His disease course has been improving. There are no hypoglycemic associated symptoms. Pertinent negatives for hypoglycemia include no confusion, headaches, pallor or seizures. There are no diabetic associated symptoms. Pertinent negatives for diabetes include no chest pain, no fatigue, no polydipsia, no polyphagia, no polyuria and no weakness. There are no hypoglycemic complications. Symptoms are stable. Diabetic complications include heart disease and peripheral neuropathy. Risk factors for coronary artery disease include dyslipidemia, diabetes mellitus, hypertension and male sex. His weight is stable. He has had a previous visit with a dietitian. He participates in exercise intermittently. His home blood glucose trend is decreasing steadily. His overall blood glucose range is 140-180 mg/dl. An ACE inhibitor/angiotensin II receptor blocker is being taken. Eye exam is current.  Hypertension This is a chronic problem. The current episode started more than 1 year ago. The problem has been waxing and waning since onset. Pertinent negatives include no chest pain, headaches, neck pain, palpitations or shortness of breath. Risk factors for coronary artery  disease include diabetes mellitus, dyslipidemia, male gender and sedentary lifestyle.  Hyperlipidemia This is a chronic problem. The current episode started more than 1 year ago. Recent lipid tests were reviewed and are high. Pertinent negatives include no chest pain, myalgias or shortness of breath. Current antihyperlipidemic treatment includes statins.     Review of Systems  Constitutional: Negative for fatigue and unexpected weight change.  HENT: Negative for dental problem, mouth sores and trouble swallowing.   Eyes: Negative for visual disturbance.  Respiratory: Negative for cough, choking, chest tightness, shortness of breath and wheezing.   Cardiovascular: Negative for chest pain, palpitations and leg swelling.  Gastrointestinal: Negative for nausea, vomiting, abdominal pain, diarrhea, constipation and abdominal distention.  Endocrine: Negative for polydipsia, polyphagia and polyuria.  Genitourinary: Negative for dysuria, urgency, hematuria and flank pain.  Musculoskeletal: Negative for myalgias, back pain, gait problem and  neck pain.  Skin: Negative for pallor, rash and wound.  Neurological: Negative for seizures, syncope, weakness, numbness and headaches.  Psychiatric/Behavioral: Negative.  Negative for confusion and dysphoric mood.    Objective:    BP 144/73 mmHg  Pulse 65  Ht 5\' 8"  (1.727 m)  Wt 191 lb (86.637 kg)  BMI 29.05 kg/m2  SpO2 96%  Wt Readings from Last 3 Encounters:  03/15/15 191 lb (86.637 kg)  01/10/15 193 lb 11.2 oz (87.862 kg)  12/02/14 192 lb (87.091 kg)    Physical Exam  Constitutional: He is oriented to person, place, and time. He appears well-developed and well-nourished. He is cooperative. No distress.  HENT:  Head: Normocephalic and atraumatic.  Eyes: EOM are normal.  Neck: Normal range of motion. Neck supple. No tracheal deviation present. No thyromegaly present.  Cardiovascular: Normal rate, S1 normal, S2 normal and normal heart sounds.  Exam  reveals no gallop.   No murmur heard. Pulses:      Dorsalis pedis pulses are 1+ on the right side, and 1+ on the left side.       Posterior tibial pulses are 1+ on the right side, and 1+ on the left side.  Pulmonary/Chest: Breath sounds normal. No respiratory distress. He has no wheezes.  Abdominal: Soft. Bowel sounds are normal. He exhibits no distension. There is no tenderness. There is no guarding and no CVA tenderness.  Musculoskeletal: He exhibits no edema.       Right shoulder: He exhibits no swelling and no deformity.  Neurological: He is alert and oriented to person, place, and time. He has normal strength and normal reflexes. No cranial nerve deficit or sensory deficit. Gait normal.  Skin: Skin is warm and dry. No rash noted. No cyanosis. Nails show no clubbing.  Psychiatric: He has a normal mood and affect. His speech is normal and behavior is normal. Judgment and thought content normal. Cognition and memory are normal.    Results for orders placed or performed in visit on 03/15/15  HM DIABETES EYE EXAM  Result Value Ref Range   HM Diabetic Eye Exam No Retinopathy No Retinopathy   Complete Blood Count (Most recent): Lab Results  Component Value Date   WBC 6.6 12/30/2012   HGB 12.6* 11/27/2013   HCT 36.1* 11/27/2013   MCV 87.9 12/30/2012   PLT 168 12/30/2012   Chemistry (most recent): Lab Results  Component Value Date   NA 139 03/03/2015   K 4.7 03/03/2015   CL 101 03/03/2015   CO2 21 03/03/2015   BUN 28* 03/03/2015   CREATININE 1.09 03/03/2015   Diabetic Labs (most recent): Lab Results  Component Value Date   HGBA1C 6.5* 03/03/2015   HGBA1C 7.6* 12/28/2012   Lipid profile (most recent): Lab Results  Component Value Date   TRIG 574* 12/29/2012   CHOL 175 12/29/2012         Assessment & Plan:   1. Type 2 diabetes mellitus with vascular disease ([place]) His diabetes is  complicated by coronary artery disease status post CABG in 2006 and recent stent  placement in 2014. Patient came with improved glucose profile, and  recent A1c of 6.5 %.  Glucose logs and insulin administration records pertaining to this visit,  to be scanned into patient's records.  Recent labs reviewed. - Patient remains at a high risk for more acute and chronic complications of diabetes which include CAD, CVA, CKD, retinopathy, and neuropathy. These are all discussed in detail with the patient.  -  I have re-counseled the patient on diet management and weight loss  by adopting a carbohydrate restricted / protein rich  Diet. - Patient is advised to stick to a routine mealtimes to eat 3 meals  a day and avoid unnecessary snacks ( to snack only to correct hypoglycemia).  - Suggestion is made for patient to avoid simple carbohydrates   from their diet including Cakes , Desserts, Ice Cream,  Soda (  diet and regular) , Sweet Tea , Candies,  Chips, Cookies, Artificial Sweeteners,   and "Sugar-free" Products .  This will help patient to have stable blood glucose profile and potentially avoid unintended  Weight gain.  - The patient  has been  scheduled with Jearld Fenton, RDN, CDE for individualized DM education. - I have approached patient with the following individualized plan to manage diabetes and patient agrees.  - I will proceed with basal insulin Lantus 50 units QHS, and prandial insulin NovoLog 10 units TIDAC for pre-meal BG readings of 90-150mg /dl, plus patient specific correction dose of rapid acting insulin  for unexpected hyperglycemia above 150mg /dl, associated with strict monitoring of glucose  AC and HS. - Patient is warned not to take insulin without proper monitoring per orders. -Adjustment parameters are given for hypo and hyperglycemia in writing. -Patient is encouraged to call clinic for blood glucose levels less than 70 or above 300 mg /dl. - I will continue metformin 1000 mg by mouth twice a day and Invokana 300 mg by mouth every morning, therapeutically suitable  for patient..  - Patient will be considered for incretin therapy as appropriate next visit. - Patient specific target  for A1c; LDL, HDL, Triglycerides, and  Waist Circumference were discussed in detail.  2) BP/HTN: Unontrolled. Continue current medications including ACEI. 3) Lipids/HPL: No recent LDL,  continue statins. 4)  Weight/Diet: CDE consult in progress, exercise, and carbohydrates information provided.  5) Chronic Care/Health Maintenance:  -Patient  Is on ACEI and Statin medications and encouraged to continue to follow up with Ophthalmology, Podiatrist at least yearly or according to recommendations, and advised to stay away from smoking. I have recommended yearly flu vaccine and pneumonia vaccination at least every 5 years; moderate intensity exercise for up to 150 minutes weekly; and  sleep for at least 7 hours a day.  I advised patient to maintain close follow up with their PCP for primary care needs.  Patient is asked to bring meter and  blood glucose logs during their next visit.   Follow up plan: Return in about 3 months (around 06/15/2015) for diabetes, high blood pressure, high cholesterol, follow up with pre-visit labs, meter, and logs.  Glade Lloyd, MD Phone: [phone]  Fax: [phone]   03/15/2015, 11:57 AM

## 2015-03-27 ENCOUNTER — Other Ambulatory Visit: Payer: Self-pay | Admitting: "Endocrinology

## 2015-04-02 ENCOUNTER — Other Ambulatory Visit: Payer: Self-pay | Admitting: "Endocrinology

## 2015-04-22 ENCOUNTER — Other Ambulatory Visit: Payer: Self-pay | Admitting: Cardiovascular Disease

## 2015-04-22 MED ORDER — OMEGA-3-ACID ETHYL ESTERS 1 G PO CAPS
2.0000 g | ORAL_CAPSULE | Freq: Two times a day (BID) | ORAL | Status: DC
Start: 2015-04-22 — End: 2015-12-22

## 2015-04-29 ENCOUNTER — Other Ambulatory Visit: Payer: Self-pay | Admitting: "Endocrinology

## 2015-06-10 LAB — CMP14+EGFR
ALBUMIN: 4.3 g/dL (ref 3.5–5.5)
ALT: 18 IU/L (ref 0–44)
AST: 17 IU/L (ref 0–40)
Albumin/Globulin Ratio: 1.9 (ref 1.1–2.5)
Alkaline Phosphatase: 71 IU/L (ref 39–117)
BUN / CREAT RATIO: 21 — AB (ref 9–20)
BUN: 21 mg/dL (ref 6–24)
Bilirubin Total: 0.4 mg/dL (ref 0.0–1.2)
CALCIUM: 9.6 mg/dL (ref 8.7–10.2)
CO2: 20 mmol/L (ref 18–29)
CREATININE: 1.02 mg/dL (ref 0.76–1.27)
Chloride: 103 mmol/L (ref 96–106)
GFR calc Af Amer: 93 mL/min/{1.73_m2} (ref >59)
GFR, EST NON AFRICAN AMERICAN: 81 mL/min/{1.73_m2} (ref >59)
GLOBULIN, TOTAL: 2.3 g/dL (ref 1.5–4.5)
Glucose: 134 mg/dL — ABNORMAL HIGH (ref 65–99)
Potassium: 4.4 mmol/L (ref 3.5–5.2)
SODIUM: 140 mmol/L (ref 134–144)
Total Protein: 6.6 g/dL (ref 6.0–8.5)

## 2015-06-10 LAB — LIPID PANEL
CHOLESTEROL TOTAL: 197 mg/dL (ref 100–199)
Chol/HDL Ratio: 5.3 ratio units — ABNORMAL HIGH (ref 0.0–5.0)
HDL: 37 mg/dL — AB (ref >39)
LDL Calculated: 103 mg/dL — ABNORMAL HIGH (ref 0–99)
Triglycerides: 287 mg/dL — ABNORMAL HIGH (ref 0–149)
VLDL CHOLESTEROL CAL: 57 mg/dL — AB (ref 5–40)

## 2015-06-10 LAB — HEMOGLOBIN A1C
ESTIMATED AVERAGE GLUCOSE: 160 mg/dL
Hgb A1c MFr Bld: 7.2 % — ABNORMAL HIGH (ref 4.8–5.6)

## 2015-06-15 ENCOUNTER — Other Ambulatory Visit: Payer: Self-pay | Admitting: Cardiovascular Disease

## 2015-06-15 ENCOUNTER — Ambulatory Visit (INDEPENDENT_AMBULATORY_CARE_PROVIDER_SITE_OTHER): Payer: BLUE CROSS/BLUE SHIELD | Admitting: "Endocrinology

## 2015-06-15 ENCOUNTER — Encounter: Payer: Self-pay | Admitting: "Endocrinology

## 2015-06-15 VITALS — BP 142/76 | HR 61 | Ht 68.0 in | Wt 194.0 lb

## 2015-06-15 DIAGNOSIS — E785 Hyperlipidemia, unspecified: Secondary | ICD-10-CM | POA: Diagnosis not present

## 2015-06-15 DIAGNOSIS — E1159 Type 2 diabetes mellitus with other circulatory complications: Secondary | ICD-10-CM | POA: Diagnosis not present

## 2015-06-15 DIAGNOSIS — I10 Essential (primary) hypertension: Secondary | ICD-10-CM | POA: Diagnosis not present

## 2015-06-15 LAB — PSA: Prostate Specific Ag, Serum: 2 ng/mL (ref 0.0–4.0)

## 2015-06-15 LAB — SPECIMEN STATUS REPORT

## 2015-06-15 MED ORDER — LISINOPRIL 20 MG PO TABS: 20.0000 mg | ORAL_TABLET | Freq: Two times a day (BID) | ORAL | Status: DC

## 2015-06-15 NOTE — Telephone Encounter (Signed)
Sent 14 day supply of lisinopril to local pharmacy. Attempted to send 90 day supply to mail order -- [facility], [facility], etc do not come up on search - no contact info on file. Unsure if this is mail order pharmacy or Rx benefits. Sought clarification of need from patient, called, no answer when dialed, no VM set up.

## 2015-06-15 NOTE — Patient Instructions (Signed)

## 2015-06-15 NOTE — Progress Notes (Signed)
Subjective:    Patient ID: Francis Hall, male    DOB: 1955/11/06,    Past Medical History  Diagnosis Date  . Hyperplastic colon polyp 07/16/07    30cm   . Diabetes mellitus     insulin  . CAD (coronary artery disease)     [facility]  . Hyperlipemia   . HTN (hypertension)   . Family hx of colon cancer   . Diverticulosis   . Atrial fibrillation ([place])   . CHF (congestive heart failure) ([place]) 11/08/2010    2D Echo EF=>55%  . Dyspnea   . OSA (obstructive sleep apnea) 12/21/2007    sleep study perform REM 53mns REM and NREM was 95.0%, AHI of 12.31hr and RDI of 12.4hr  . Myocardial infarction ([facility] 12/2012   Past Surgical History  Procedure Laterality Date  . Coronary artery bypass graft  06/2004    x2 performed for 40% left main stenosis, 80% proximal LAD stenosis of the first diagonal artery, and 50% left circumfles artery priximal stenosis.LIMA to LAD and free radial artery to circumflex coronary artery  . Tonsillectomy  1962  . Colonoscopy  07/2007    Dr JArnoldo Morale hyperplastic polyp 30cm  . Colonoscopy N/A 08/12/2012    Procedure: COLONOSCOPY;  Surgeon: SDanie Binder MD;  Location: [facility] ENDO SUITE;  Service: Endoscopy;  Laterality: N/A;  8:30  . Cardiac catheterization  2006 2011    2 stents  . Cataract extraction w/phaco Left 12/03/2013    Procedure: CATARACT EXTRACTION PHACO AND INTRAOCULAR LENS PLACEMENT (IOC);  Surgeon: KTonny Branch MD;  Location: [facility] ORS;  Service: Ophthalmology;  Laterality: Left;  CDE:7.07  . Cataract extraction w/phaco Right 12/14/2013    Procedure: CATARACT EXTRACTION PHACO AND INTRAOCULAR LENS PLACEMENT (IOC);  Surgeon: KTonny Branch MD;  Location: [facility] ORS;  Service: Ophthalmology;  Laterality: Right;  CDE 7.84   . Left heart catheterization with coronary/graft angiogram  12/29/2012    Procedure: LEFT HEART CATHETERIZATION WITH CBeatrix Fetters  Surgeon: DLeonie Man MD;  [facility];  Service: Cardiovascular;;   Social History    Social History  . Marital Status: Married    Spouse Name: N/A  . Number of Children: 2  . Years of Education: 12   Occupational History  . disabled mDealer    Social History Main Topics  . Smoking status: Never Smoker   . Smokeless tobacco: Never Used  . Alcohol Use: Yes     Comment: social-2 beers/wk  . Drug Use: No  . Sexual Activity: Yes    Birth Control/ Protection: None   Other Topics Concern  . None   Social History Narrative   Outpatient Encounter Prescriptions as of 06/15/2015  Medication Sig  . acetaminophen (TYLENOL) 325 MG tablet Take 2 tablets (650 mg total) by mouth every 4 (four) hours as needed.  .Marland Kitchenaspirin 81 MG EC tablet Take 81 mg by mouth daily.    .Marland Kitchenatorvastatin (LIPITOR) 20 MG tablet TAKE 1 TABLET BY MOUTH AT BEDTIME  . BRILINTA 90 MG TABS tablet TAKE 1 TABLET (90 MG TOTAL) BY MOUTH 2 (TWO) TIMES DAILY.  . carvedilol (COREG) 25 MG tablet Take 25 mg by mouth 2 (two) times daily with a meal.    . Cholecalciferol (VITAMIN D3) 1000 UNITS CAPS Take 2 capsules by mouth daily.  .Marland Kitchendiltiazem (CARTIA XT) 120 MG 24 hr capsule Take 1 capsule (120 mg total) by mouth daily.  . fenofibrate (TRICOR) 48 MG tablet Take  1 tablet (48 mg total) by mouth daily.  . hydrochlorothiazide (MICROZIDE) 12.5 MG capsule TAKE 1 CAPSULE (12.5 MG TOTAL) BY MOUTH DAILY AS NEEDED.  [name] Insulin Glargine (LANTUS SOLOSTAR) 100 UNIT/ML Solostar Pen Inject 50 Units into the skin at bedtime.  . INVOKANA 300 MG TABS tablet TAKE 1 TABLET BY MOUTH EVERY DAY  . lisinopril (PRINIVIL,ZESTRIL) 20 MG tablet Take 20 mg by mouth 2 (two) times daily.  . metFORMIN (GLUCOPHAGE) 1000 MG tablet Take 1 tablet BY MOUTH  TWICE DAILY  . Multiple Vitamin (MULTIVITAMIN) capsule Take 1 capsule by mouth daily.    . nitroGLYCERIN (NITROSTAT) 0.4 MG SL tablet Place 0.4 mg under the tongue every 5 (five) minutes as needed for chest pain.  [name] NITROSTAT 0.4 MG SL tablet PLACE 1 TABLET UNDER TONGUE IF NEEDED FOR CHEST  PAIN..MAX OF 3 DOSES  . NOVOLOG FLEXPEN 100 UNIT/ML FlexPen Inject 10-13 Units into the skin 3 (three) times daily with meals. per sliding scale  . omega-3 acid ethyl esters (LOVAZA) 1 G capsule Take 2 capsules (2 g total) by mouth 2 (two) times daily.  . vitamin C (ASCORBIC ACID) 500 MG tablet Take 500 mg by mouth daily.  . [DISCONTINUED] insulin glargine (LANTUS) 100 UNIT/ML injection Inject 60 Units into the skin at bedtime.    No facility-administered encounter medications on file as of 06/15/2015.   ALLERGIES: Allergies  Allergen Reactions  . Crestor [Rosuvastatin] Other (See Comments)    myalgia   VACCINATION STATUS:  There is no immunization history on file for this patient.  Diabetes He presents for his follow-up diabetic visit. He has type 2 diabetes mellitus. Onset time: He was diagnosed at approximate age of 59 years. His disease course has been worsening. There are no hypoglycemic associated symptoms. Pertinent negatives for hypoglycemia include no confusion, headaches, pallor or seizures. There are no diabetic associated symptoms. Pertinent negatives for diabetes include no chest pain, no fatigue, no polydipsia, no polyphagia, no polyuria and no weakness. There are no hypoglycemic complications. Symptoms are worsening. Diabetic complications include heart disease and peripheral neuropathy. Risk factors for coronary artery disease include dyslipidemia, diabetes mellitus, hypertension and male sex. His weight is stable. He has had a previous visit with a dietitian. He participates in exercise intermittently. His home blood glucose trend is decreasing steadily (He still has significant difficulty maintaining a regular schedule for monitoring and meals. It appears that he is addressing hyperglycemia at random instead of following for insulin chart I gave him.). His breakfast blood glucose range is generally 180-200 mg/dl. His overall blood glucose range is 180-200 mg/dl. An ACE  inhibitor/angiotensin II receptor blocker is being taken. Eye exam is current.  Hypertension This is a chronic problem. The current episode started more than 1 year ago. The problem has been waxing and waning since onset. Pertinent negatives include no chest pain, headaches, neck pain, palpitations or shortness of breath. Risk factors for coronary artery disease include diabetes mellitus, dyslipidemia, male gender and sedentary lifestyle.  Hyperlipidemia This is a chronic problem. The current episode started more than 1 year ago. Recent lipid tests were reviewed and are high. Pertinent negatives include no chest pain, myalgias or shortness of breath. Current antihyperlipidemic treatment includes statins.     Review of Systems  Constitutional: Negative for fatigue and unexpected weight change.  HENT: Negative for dental problem, mouth sores and trouble swallowing.   Eyes: Negative for visual disturbance.  Respiratory: Negative for cough, choking, chest tightness, shortness of breath and  wheezing.   Cardiovascular: Negative for chest pain, palpitations and leg swelling.  Gastrointestinal: Negative for nausea, vomiting, abdominal pain, diarrhea, constipation and abdominal distention.  Endocrine: Negative for polydipsia, polyphagia and polyuria.  Genitourinary: Negative for dysuria, urgency, hematuria and flank pain.  Musculoskeletal: Negative for myalgias, back pain, gait problem and neck pain.  Skin: Negative for pallor, rash and wound.  Neurological: Negative for seizures, syncope, weakness, numbness and headaches.  Psychiatric/Behavioral: Negative.  Negative for confusion and dysphoric mood.    Objective:    BP 142/76 mmHg  Pulse 61  Ht _0  (1.727 m)  Wt 194 lb (87.998 kg)  BMI 29.50 kg/m2  SpO2 97%  Wt Readings from Last 3 Encounters:  06/15/15 194 lb (87.998 kg)  03/15/15 191 lb (86.637 kg)  01/10/15 193 lb 11.2 oz (87.862 kg)    Physical Exam  Constitutional: He is oriented  to person, place, and time. He appears well-developed and well-nourished. He is cooperative. No distress.  HENT:  Head: Normocephalic and atraumatic.  Eyes: EOM are normal.  Neck: Normal range of motion. Neck supple. No tracheal deviation present. No thyromegaly present.  Cardiovascular: Normal rate, S1 normal, S2 normal and normal heart sounds.  Exam reveals no gallop.   No murmur heard. Pulses:      Dorsalis pedis pulses are 1+ on the right side, and 1+ on the left side.       Posterior tibial pulses are 1+ on the right side, and 1+ on the left side.  Pulmonary/Chest: Breath sounds normal. No respiratory distress. He has no wheezes.  Abdominal: Soft. Bowel sounds are normal. He exhibits no distension. There is no tenderness. There is no guarding and no CVA tenderness.  Musculoskeletal: He exhibits no edema.       Right shoulder: He exhibits no swelling and no deformity.  Neurological: He is alert and oriented to person, place, and time. He has normal strength and normal reflexes. No cranial nerve deficit or sensory deficit. Gait normal.  Skin: Skin is warm and dry. No rash noted. No cyanosis. Nails show no clubbing.  Psychiatric: He has a normal mood and affect. His speech is normal and behavior is normal. Judgment and thought content normal. Cognition and memory are normal.    Results for orders placed or performed in visit on 03/15/15  Lipid panel  Result Value Ref Range   Cholesterol, Total 197 100 - 199 mg/dL   Triglycerides 287 (H) 0 - 149 mg/dL   HDL 37 (L) >39 mg/dL   VLDL Cholesterol Cal 57 (H) 5 - 40 mg/dL   LDL Calculated 103 (H) 0 - 99 mg/dL   Chol/HDL Ratio 5.3 (H) 0.0 - 5.0 ratio units  CMP14+EGFR  Result Value Ref Range   Glucose 134 (H) 65 - 99 mg/dL   BUN 21 6 - 24 mg/dL   Creatinine, Ser 1.02 0.76 - 1.27 mg/dL   GFR calc non Af Amer 81 >59 mL/min/1.73   GFR calc Af Amer 93 >59 mL/min/1.73   BUN/Creatinine Ratio 21 (H) 9 - 20   Sodium 140 134 - 144 mmol/L    Potassium 4.4 3.5 - 5.2 mmol/L   Chloride 103 96 - 106 mmol/L   CO2 20 18 - 29 mmol/L   Calcium 9.6 8.7 - 10.2 mg/dL   Total Protein 6.6 6.0 - 8.5 g/dL   Albumin 4.3 3.5 - 5.5 g/dL   Globulin, Total 2.3 1.5 - 4.5 g/dL   Albumin/Globulin Ratio 1.9 1.1 - 2.5  Bilirubin Total 0.4 0.0 - 1.2 mg/dL   Alkaline Phosphatase 71 39 - 117 IU/L   AST 17 0 - 40 IU/L   ALT 18 0 - 44 IU/L  Hemoglobin A1c  Result Value Ref Range   Hgb A1c MFr Bld 7.2 (H) 4.8 - 5.6 %   Est. average glucose Bld gHb Est-mCnc 160 mg/dL  PSA  Result Value Ref Range   Prostate Specific Ag, Serum 2.0 0.0 - 4.0 ng/mL  Specimen status report  Result Value Ref Range   specimen status report Comment   HM DIABETES EYE EXAM  Result Value Ref Range   HM Diabetic Eye Exam No Retinopathy No Retinopathy   Diabetic Labs (most recent): Lab Results  Component Value Date   HGBA1C 7.2* 06/09/2015   HGBA1C 6.5* 03/03/2015   HGBA1C 7.6* 12/28/2012   Lipid Panel     Component Value Date/Time   CHOL 197 06/09/2015 1034   CHOL 175 12/29/2012 0438   TRIG 287* 06/09/2015 1034   HDL 37* 06/09/2015 1034   HDL 25* 12/29/2012 0438   CHOLHDL 5.3* 06/09/2015 1034   CHOLHDL 7.0 12/29/2012 0438   VLDL UNABLE TO CALCULATE IF TRIGLYCERIDE OVER 400 mg/dL 12/29/2012 0438   LDLCALC 103* 06/09/2015 1034   LDLCALC UNABLE TO CALCULATE IF TRIGLYCERIDE OVER 400 mg/dL 12/29/2012 0438     Assessment & Plan:   1. Type 2 diabetes mellitus with vascular disease ([place])  His diabetes is  complicated by coronary artery disease status post CABG in 2006 and recent stent placement in 2014. Patient came with improved glucose profile, and  recent A1c of 6.5 %.  Glucose logs and insulin administration records pertaining to this visit,  to be scanned into patient's records.  Recent labs reviewed. - Patient remains at a high risk for more acute and chronic complications of diabetes which include CAD, CVA, CKD, retinopathy, and neuropathy. These are all  discussed in detail with the patient.  - I have re-counseled the patient on diet management and weight loss  by adopting a carbohydrate restricted / protein rich  Diet. -We had a long discussion regarding the need for him to stick to the monitoring and insulin administration schedule I gave him. - Patient is advised to stick to a routine mealtimes to eat 3 meals  a day and avoid unnecessary snacks ( to snack only to correct hypoglycemia).  - Suggestion is made for patient to avoid simple carbohydrates   from their diet including Cakes , Desserts, Ice Cream,  Soda (  diet and regular) , Sweet Tea , Candies,  Chips, Cookies, Artificial Sweeteners,   and "Sugar-free" Products .  This will help patient to have stable blood glucose profile and potentially avoid unintended  Weight gain.  - The patient  has been  scheduled with Jearld Fenton, RDN, CDE for individualized DM education. - I have approached patient with the following individualized plan to manage diabetes and patient agrees.  - I will proceed with basal insulin Lantus 50 units QHS, and prandial insulin NovoLog 10 units TIDAC for pre-meal BG readings of 90-164m/dl, plus patient specific correction dose of rapid acting insulin  for unexpected hyperglycemia above 1530mdl, associated with strict monitoring of glucose  AC and HS. - Patient is warned not to take insulin without proper monitoring per orders. -Adjustment parameters are given for hypo and hyperglycemia in writing. -Patient is encouraged to call clinic for blood glucose levels less than 70 or above 300 mg /dl. - I will  continue metformin 1000 mg by mouth twice a day and Invokana 300 mg by mouth every morning, therapeutically suitable for patient..  - Patient will be considered for incretin therapy as appropriate next visit. - Patient specific target  for A1c; LDL, HDL, Triglycerides, and  Waist Circumference were discussed in detail.  2) BP/HTN: Unontrolled. Continue current  medications including ACEI. 3) Lipids/HPL: No recent LDL,  continue statins. 4)  Weight/Diet: CDE consult in progress, exercise, and carbohydrates information provided.  5) Chronic Care/Health Maintenance:  -Patient  Is on ACEI and Statin medications and encouraged to continue to follow up with Ophthalmology, Podiatrist at least yearly or according to recommendations, and advised to stay away from smoking. I have recommended yearly flu vaccine and pneumonia vaccination at least every 5 years; moderate intensity exercise for up to 150 minutes weekly; and  sleep for at least 7 hours a day.  I advised patient to maintain close follow up with their PCP for primary care needs.  Patient is asked to bring meter and  blood glucose logs during their next visit.   Follow up plan: Return in about 3 months (around 09/13/2015) for diabetes, high blood pressure, high cholesterol, follow up with pre-visit labs, meter, and logs.  Glade Lloyd, MD Phone: [phone]  Fax: [phone]   06/15/2015, 2:43 PM

## 2015-06-15 NOTE — Telephone Encounter (Signed)
°*  STAT* If patient is at the pharmacy, call can be transferred to refill team.   1. Which medications need to be refilled? (please list name of each medication and dose if known) Lisinorpril   2. Which pharmacy/location (including street and city if local pharmacy) is medication to be sent to? [facility] [address]  3. Do they need a 30 day or 90 day supply? 14 day supply and 90 day

## 2015-06-16 NOTE — Telephone Encounter (Signed)
2nd attempt to reach. NA, no VM.

## 2015-06-24 ENCOUNTER — Other Ambulatory Visit: Payer: Self-pay | Admitting: "Endocrinology

## 2015-06-30 ENCOUNTER — Other Ambulatory Visit: Payer: Self-pay | Admitting: "Endocrinology

## 2015-06-30 ENCOUNTER — Other Ambulatory Visit: Payer: Self-pay | Admitting: Cardiovascular Disease

## 2015-07-01 NOTE — Telephone Encounter (Signed)
Rx request sent to pharmacy.  

## 2015-08-10 ENCOUNTER — Other Ambulatory Visit: Payer: Self-pay | Admitting: "Endocrinology

## 2015-08-28 ENCOUNTER — Other Ambulatory Visit: Payer: Self-pay | Admitting: "Endocrinology

## 2015-09-13 ENCOUNTER — Other Ambulatory Visit: Payer: Self-pay | Admitting: "Endocrinology

## 2015-09-14 LAB — BASIC METABOLIC PANEL
BUN / CREAT RATIO: 21 — AB (ref 9–20)
BUN: 19 mg/dL (ref 6–24)
CALCIUM: 9.7 mg/dL (ref 8.7–10.2)
CO2: 21 mmol/L (ref 18–29)
Chloride: 104 mmol/L (ref 96–106)
Creatinine, Ser: 0.89 mg/dL (ref 0.76–1.27)
GFR, EST AFRICAN AMERICAN: 109 mL/min/{1.73_m2} (ref >59)
GFR, EST NON AFRICAN AMERICAN: 94 mL/min/{1.73_m2} (ref >59)
Glucose: 142 mg/dL — ABNORMAL HIGH (ref 65–99)
POTASSIUM: 4.7 mmol/L (ref 3.5–5.2)
Sodium: 141 mmol/L (ref 134–144)

## 2015-09-14 LAB — HGB A1C W/O EAG: Hgb A1c MFr Bld: 7.4 % — ABNORMAL HIGH (ref 4.8–5.6)

## 2015-09-20 ENCOUNTER — Encounter: Payer: Self-pay | Admitting: "Endocrinology

## 2015-09-20 ENCOUNTER — Ambulatory Visit (INDEPENDENT_AMBULATORY_CARE_PROVIDER_SITE_OTHER): Payer: BLUE CROSS/BLUE SHIELD | Admitting: "Endocrinology

## 2015-09-20 VITALS — BP 135/74 | HR 71 | Ht 68.0 in | Wt 187.0 lb

## 2015-09-20 DIAGNOSIS — I10 Essential (primary) hypertension: Secondary | ICD-10-CM | POA: Diagnosis not present

## 2015-09-20 DIAGNOSIS — E1159 Type 2 diabetes mellitus with other circulatory complications: Secondary | ICD-10-CM | POA: Diagnosis not present

## 2015-09-20 DIAGNOSIS — E785 Hyperlipidemia, unspecified: Secondary | ICD-10-CM

## 2015-09-20 MED ORDER — INSULIN GLARGINE 100 UNIT/ML SOLOSTAR PEN: 50.0000 [IU] | PEN_INJECTOR | Freq: Every day | SUBCUTANEOUS | Status: DC

## 2015-09-20 MED ORDER — CANAGLIFLOZIN 300 MG PO TABS: 300.0000 mg | ORAL_TABLET | Freq: Every day | ORAL | Status: DC

## 2015-09-20 MED ORDER — METFORMIN HCL 1000 MG PO TABS: 1000.0000 mg | ORAL_TABLET | Freq: Two times a day (BID) | ORAL | Status: DC

## 2015-09-20 MED ORDER — INSULIN ASPART 100 UNIT/ML FLEXPEN: 10.0000 [IU] | PEN_INJECTOR | Freq: Three times a day (TID) | SUBCUTANEOUS | Status: DC

## 2015-09-20 NOTE — Progress Notes (Signed)
Subjective:    Patient ID: Francis Hall, male    DOB: January 15, 1956,    Past Medical History  Diagnosis Date  . Hyperplastic colon polyp 07/16/07    30cm   . Diabetes mellitus     insulin  . CAD (coronary artery disease)     [place]  . Hyperlipemia   . HTN (hypertension)   . Family hx of colon cancer   . Diverticulosis   . Atrial fibrillation ([place])   . CHF (congestive heart failure) ([place]) 11/08/2010    2D Echo EF=>55%  . Dyspnea   . OSA (obstructive sleep apnea) 12/21/2007    sleep study perform REM 57mins REM and NREM was 95.0%, AHI of 12.31hr and RDI of 12.4hr  . Myocardial infarction [facility]) 12/2012   Past Surgical History  Procedure Laterality Date  . Coronary artery bypass graft  06/2004    x2 performed for 40% left main stenosis, 80% proximal LAD stenosis of the first diagonal artery, and 50% left circumfles artery priximal stenosis.LIMA to LAD and free radial artery to circumflex coronary artery  . Tonsillectomy  1962  . Colonoscopy  07/2007    Dr Arnoldo Morale: hyperplastic polyp 30cm  . Colonoscopy N/A 08/12/2012    Procedure: COLONOSCOPY;  Surgeon: Danie Binder, MD;  Location: [facility] ENDO SUITE;  Service: Endoscopy;  Laterality: N/A;  8:30  . Cardiac catheterization  2006 2011    2 stents  . Cataract extraction w/phaco Left 12/03/2013    Procedure: CATARACT EXTRACTION PHACO AND INTRAOCULAR LENS PLACEMENT (IOC);  Surgeon: Tonny Branch, MD;  Location: [facility] ORS;  Service: Ophthalmology;  Laterality: Left;  CDE:7.07  . Cataract extraction w/phaco Right 12/14/2013    Procedure: CATARACT EXTRACTION PHACO AND INTRAOCULAR LENS PLACEMENT (IOC);  Surgeon: Tonny Branch, MD;  Location: [facility] ORS;  Service: Ophthalmology;  Laterality: Right;  CDE 7.84   . Left heart catheterization with coronary/graft angiogram  12/29/2012    Procedure: LEFT HEART CATHETERIZATION WITH Beatrix Fetters;  Surgeon: Leonie Man, MD;  [facility] C[facility] LAB;  Service: Cardiovascular;;   Social History    Social History  . Marital Status: Married    Spouse Name: N/A  . Number of Children: 2  . Years of Education: 12   Occupational History  . disabled Dealer     Social History Main Topics  . Smoking status: Never Smoker   . Smokeless tobacco: Never Used  . Alcohol Use: Yes     Comment: social-2 beers/wk  . Drug Use: No  . Sexual Activity: Yes    Birth Control/ Protection: None   Other Topics Concern  . None   Social History Narrative   Outpatient Encounter Prescriptions as of 09/20/2015  Medication Sig  . acetaminophen (TYLENOL) 325 MG tablet Take 2 tablets (650 mg total) by mouth every 4 (four) hours as needed.  [name] aspirin 81 MG EC tablet Take 81 mg by mouth daily.    [name] atorvastatin (LIPITOR) 20 MG tablet TAKE 1 TABLET BY MOUTH AT BEDTIME  . BRILINTA 90 MG TABS tablet TAKE 1 TABLET (90 MG TOTAL) BY MOUTH 2 (TWO) TIMES DAILY.  . canagliflozin (INVOKANA) 300 MG TABS tablet Take 1 tablet (300 mg total) by mouth daily.  . carvedilol (COREG) 25 MG tablet Take 25 mg by mouth 2 (two) times daily with a meal.    . Cholecalciferol (VITAMIN D3) 1000 UNITS CAPS Take 2 capsules by mouth daily.  [name] diltiazem (CARTIA XT) 120 MG 24 hr capsule  Take 1 capsule (120 mg total) by mouth daily.  . fenofibrate (TRICOR) 48 MG tablet Take 1 tablet (48 mg total) by mouth daily.  . hydrochlorothiazide (MICROZIDE) 12.5 MG capsule TAKE 1 CAPSULE (12.5 MG TOTAL) BY MOUTH DAILY AS NEEDED.  [name] insulin aspart (NOVOLOG FLEXPEN) 100 UNIT/ML FlexPen Inject 10-16 Units into the skin 3 (three) times daily with meals.  . Insulin Glargine (LANTUS SOLOSTAR) 100 UNIT/ML Solostar Pen Inject 50 Units into the skin at bedtime.  [name] lisinopril (PRINIVIL,ZESTRIL) 20 MG tablet TAKE 1 TABLET (20 MG TOTAL) BY MOUTH 2 (TWO) TIMES DAILY.  . metFORMIN (GLUCOPHAGE) 1000 MG tablet Take 1 tablet (1,000 mg total) by mouth 2 (two) times daily with a meal.  . Multiple Vitamin (MULTIVITAMIN) capsule Take 1 capsule by mouth daily.    .  nitroGLYCERIN (NITROSTAT) 0.4 MG SL tablet Place 0.4 mg under the tongue every 5 (five) minutes as needed for chest pain.  [name] NITROSTAT 0.4 MG SL tablet PLACE 1 TABLET UNDER TONGUE IF NEEDED FOR CHEST PAIN..MAX OF 3 DOSES  . omega-3 acid ethyl esters (LOVAZA) 1 G capsule Take 2 capsules (2 g total) by mouth 2 (two) times daily.  . vitamin C (ASCORBIC ACID) 500 MG tablet Take 500 mg by mouth daily.  . [DISCONTINUED] Insulin Glargine (LANTUS SOLOSTAR) 100 UNIT/ML Solostar Pen Inject 50 Units into the skin at bedtime.  . [DISCONTINUED] INVOKANA 300 MG TABS tablet TAKE 1 TABLET BY MOUTH EVERY DAY  . [DISCONTINUED] metFORMIN (GLUCOPHAGE) 1000 MG tablet Take 1 tablet by mouth  twice a day  . [DISCONTINUED] NOVOLOG FLEXPEN 100 UNIT/ML FlexPen Inject subcutaneously 10  units 3 times daily   No facility-administered encounter medications on file as of 09/20/2015.   ALLERGIES: Allergies  Allergen Reactions  . Crestor [Rosuvastatin] Other (See Comments)    myalgia   VACCINATION STATUS:  There is no immunization history on file for this patient.  Diabetes He presents for his follow-up diabetic visit. He has type 2 diabetes mellitus. Onset time: He was diagnosed at approximate age of 53 years. His disease course has been worsening. There are no hypoglycemic associated symptoms. Pertinent negatives for hypoglycemia include no confusion, headaches, pallor or seizures. There are no diabetic associated symptoms. Pertinent negatives for diabetes include no chest pain, no fatigue, no polydipsia, no polyphagia, no polyuria and no weakness. There are no hypoglycemic complications. Symptoms are worsening. Diabetic complications include heart disease and peripheral neuropathy. Risk factors for coronary artery disease include dyslipidemia, diabetes mellitus, hypertension and male sex. His weight is stable. He has had a previous visit with a dietitian. He participates in exercise intermittently. His home blood glucose  trend is decreasing steadily (He still has significant difficulty maintaining a regular schedule for monitoring and meals. It appears that he is addressing hyperglycemia at random instead of following for insulin chart I gave him.). His breakfast blood glucose range is generally 180-200 mg/dl. His overall blood glucose range is 180-200 mg/dl. An ACE inhibitor/angiotensin II receptor blocker is being taken. Eye exam is current.  Hypertension This is a chronic problem. The current episode started more than 1 year ago. The problem has been waxing and waning since onset. Pertinent negatives include no chest pain, headaches, neck pain, palpitations or shortness of breath. Risk factors for coronary artery disease include diabetes mellitus, dyslipidemia, male gender and sedentary lifestyle.  Hyperlipidemia This is a chronic problem. The current episode started more than 1 year ago. Recent lipid tests were reviewed and are high. Pertinent  negatives include no chest pain, myalgias or shortness of breath. Current antihyperlipidemic treatment includes statins.     Review of Systems  Constitutional: Negative for fatigue and unexpected weight change.  HENT: Negative for dental problem, mouth sores and trouble swallowing.   Eyes: Negative for visual disturbance.  Respiratory: Negative for cough, choking, chest tightness, shortness of breath and wheezing.   Cardiovascular: Negative for chest pain, palpitations and leg swelling.  Gastrointestinal: Negative for nausea, vomiting, abdominal pain, diarrhea, constipation and abdominal distention.  Endocrine: Negative for polydipsia, polyphagia and polyuria.  Genitourinary: Negative for dysuria, urgency, hematuria and flank pain.  Musculoskeletal: Negative for myalgias, back pain, gait problem and neck pain.  Skin: Negative for pallor, rash and wound.  Neurological: Negative for seizures, syncope, weakness, numbness and headaches.  Psychiatric/Behavioral: Negative.   Negative for confusion and dysphoric mood.    Objective:    BP 135/74 mmHg  Pulse 71  Ht 5\' 8"  (1.727 m)  Wt 187 lb (84.823 kg)  BMI 28.44 kg/m2  SpO2 97%  Wt Readings from Last 3 Encounters:  09/20/15 187 lb (84.823 kg)  06/15/15 194 lb (87.998 kg)  03/15/15 191 lb (86.637 kg)    Physical Exam  Constitutional: He is oriented to person, place, and time. He appears well-developed and well-nourished. He is cooperative. No distress.  HENT:  Head: Normocephalic and atraumatic.  Eyes: EOM are normal.  Neck: Normal range of motion. Neck supple. No tracheal deviation present. No thyromegaly present.  Cardiovascular: Normal rate, S1 normal, S2 normal and normal heart sounds.  Exam reveals no gallop.   No murmur heard. Pulses:      Dorsalis pedis pulses are 1+ on the right side, and 1+ on the left side.       Posterior tibial pulses are 1+ on the right side, and 1+ on the left side.  Pulmonary/Chest: Breath sounds normal. No respiratory distress. He has no wheezes.  Abdominal: Soft. Bowel sounds are normal. He exhibits no distension. There is no tenderness. There is no guarding and no CVA tenderness.  Musculoskeletal: He exhibits no edema.       Right shoulder: He exhibits no swelling and no deformity.  Neurological: He is alert and oriented to person, place, and time. He has normal strength and normal reflexes. No cranial nerve deficit or sensory deficit. Gait normal.  Skin: Skin is warm and dry. No rash noted. No cyanosis. Nails show no clubbing.  Psychiatric: He has a normal mood and affect. His speech is normal and behavior is normal. Judgment and thought content normal. Cognition and memory are normal.    Results for orders placed or performed in visit on [id number]  Basic metabolic panel  Result Value Ref Range   Glucose 142 (H) 65 - 99 mg/dL   BUN 19 6 - 24 mg/dL   Creatinine, Ser 0.89 0.76 - 1.27 mg/dL   GFR calc non Af Amer 94 >59 mL/min/1.73   GFR calc Af Amer 109 >59  mL/min/1.73   BUN/Creatinine Ratio 21 (H) 9 - 20   Sodium 141 134 - 144 mmol/L   Potassium 4.7 3.5 - 5.2 mmol/L   Chloride 104 96 - 106 mmol/L   CO2 21 18 - 29 mmol/L   Calcium 9.7 8.7 - 10.2 mg/dL  Hgb A1c w/o eAG  Result Value Ref Range   Hgb A1c MFr Bld 7.4 (H) 4.8 - 5.6 %   Diabetic Labs (most recent): Lab Results  Component Value Date   HGBA1C 7.4*  09/13/2015   HGBA1C 7.2* 06/09/2015   HGBA1C 6.5* 03/03/2015   Lipid Panel     Component Value Date/Time   CHOL 197 06/09/2015 1034   CHOL 175 12/29/2012 0438   TRIG 287* 06/09/2015 1034   HDL 37* 06/09/2015 1034   HDL 25* 12/29/2012 0438   CHOLHDL 5.3* 06/09/2015 1034   CHOLHDL 7.0 12/29/2012 0438   VLDL UNABLE TO CALCULATE IF TRIGLYCERIDE OVER 400 mg/dL 12/29/2012 0438   LDLCALC 103* 06/09/2015 1034   LDLCALC UNABLE TO CALCULATE IF TRIGLYCERIDE OVER 400 mg/dL 12/29/2012 0438     Assessment & Plan:   1. Type 2 diabetes mellitus with vascular disease ([place])  His diabetes is  complicated by coronary artery disease status post CABG in 2006 and recent stent placement in 2014. Patient came with Worsening glucose profile, and  recent A1c of 7.4% increasing from 6 .5 %.  Glucose logs and insulin administration records pertaining to this visit,  to be scanned into patient's records.    -He has not been consistent in monitoring and injecting insulin. He missed at least a third of his insulin injection opportunities.  -Recent labs reviewed. - Patient remains at a high risk for more acute and chronic complications of diabetes which include CAD, CVA, CKD, retinopathy, and neuropathy. These are all discussed in detail with the patient.  - I have re-counseled the patient on diet management and weight loss  by adopting a carbohydrate restricted / protein rich  Diet. -We had a long discussion regarding the need for him to stick to the monitoring and insulin administration schedule I gave him. - Patient is advised to stick to a routine  mealtimes to eat 3 meals  a day and avoid unnecessary snacks ( to snack only to correct hypoglycemia).  - Suggestion is made for patient to avoid simple carbohydrates   from their diet including Cakes , Desserts, Ice Cream,  Soda (  diet and regular) , Sweet Tea , Candies,  Chips, Cookies, Artificial Sweeteners,   and "Sugar-free" Products .  This will help patient to have stable blood glucose profile and potentially avoid unintended  Weight gain.  - The patient  has been  scheduled with Jearld Fenton, RDN, CDE for individualized DM education. - I have approached patient with the following individualized plan to manage diabetes and patient agrees.  - I will proceed with basal insulin Lantus 50 units QHS, and prandial insulin NovoLog 10 units TIDAC for pre-meal BG readings of 90-150mg /dl, plus patient specific correction dose of rapid acting insulin  for unexpected hyperglycemia above 150mg /dl, associated with strict monitoring of glucose  AC and HS. - Patient is warned not to take insulin without proper monitoring per orders. -Adjustment parameters are given for hypo and hyperglycemia in writing. -Patient is encouraged to call clinic for blood glucose levels less than 70 or above 300 mg /dl. - I will continue metformin 1000 mg by mouth twice a day and Invokana 300 mg by mouth every morning, therapeutically suitable for patient.  - Patient specific target  for A1c; LDL, HDL, Triglycerides, and  Waist Circumference were discussed in detail.  2) BP/HTN: Unontrolled. Continue current medications including ACEI. 3) Lipids/HPL: No recent LDL,  continue statins. 4)  Weight/Diet: CDE consult in progress, exercise, and carbohydrates information provided.  5) Chronic Care/Health Maintenance:  -Patient  Is on ACEI and Statin medications and encouraged to continue to follow up with Ophthalmology, Podiatrist at least yearly or according to recommendations, and advised to stay  away from smoking. I have  recommended yearly flu vaccine and pneumonia vaccination at least every 5 years; moderate intensity exercise for up to 150 minutes weekly; and  sleep for at least 7 hours a day.  I advised patient to maintain close follow up with their PCP for primary care needs.  Patient is asked to bring meter and  blood glucose logs during their next visit.   Follow up plan: Return in about 3 months (around 12/20/2015) for diabetes, high blood pressure, high cholesterol, follow up with pre-visit labs, meter, and logs.  Glade Lloyd, MD Phone: [phone]  Fax: [phone]   09/20/2015, 11:24 AM

## 2015-09-20 NOTE — Patient Instructions (Signed)

## 2015-10-25 ENCOUNTER — Telehealth: Payer: Self-pay | Admitting: Cardiovascular Disease

## 2015-10-25 NOTE — Telephone Encounter (Signed)
New message   Pt wife is calling to get pt medications sent to home [facility]   aporzatin 20mg  1 xday

## 2015-10-26 MED ORDER — ATORVASTATIN CALCIUM 20 MG PO TABS: 20.0000 mg | ORAL_TABLET | Freq: Every day | ORAL | Status: DC

## 2015-10-26 NOTE — Telephone Encounter (Signed)
Rx(s) sent to pharmacy electronically.  

## 2015-11-13 ENCOUNTER — Other Ambulatory Visit: Payer: Self-pay | Admitting: "Endocrinology

## 2015-11-27 ENCOUNTER — Other Ambulatory Visit: Payer: Self-pay | Admitting: "Endocrinology

## 2015-12-10 ENCOUNTER — Other Ambulatory Visit: Payer: Self-pay | Admitting: "Endocrinology

## 2015-12-13 ENCOUNTER — Other Ambulatory Visit: Payer: Self-pay | Admitting: Cardiovascular Disease

## 2015-12-22 ENCOUNTER — Other Ambulatory Visit: Payer: Self-pay | Admitting: Cardiovascular Disease

## 2015-12-23 LAB — CMP14+EGFR
A/G RATIO: 2 (ref 1.2–2.2)
ALK PHOS: 65 IU/L (ref 39–117)
ALT: 19 IU/L (ref 0–44)
AST: 16 IU/L (ref 0–40)
Albumin: 4.3 g/dL (ref 3.5–5.5)
BUN/Creatinine Ratio: 21 — ABNORMAL HIGH (ref 9–20)
BUN: 20 mg/dL (ref 6–24)
Bilirubin Total: 0.4 mg/dL (ref 0.0–1.2)
CHLORIDE: 100 mmol/L (ref 96–106)
CO2: 22 mmol/L (ref 18–29)
Calcium: 9.6 mg/dL (ref 8.7–10.2)
Creatinine, Ser: 0.94 mg/dL (ref 0.76–1.27)
GFR calc Af Amer: 103 mL/min/{1.73_m2} (ref >59)
GFR calc non Af Amer: 89 mL/min/{1.73_m2} (ref >59)
GLOBULIN, TOTAL: 2.2 g/dL (ref 1.5–4.5)
Glucose: 158 mg/dL — ABNORMAL HIGH (ref 65–99)
POTASSIUM: 4.4 mmol/L (ref 3.5–5.2)
SODIUM: 138 mmol/L (ref 134–144)
Total Protein: 6.5 g/dL (ref 6.0–8.5)

## 2015-12-23 LAB — HEMOGLOBIN A1C
Est. average glucose Bld gHb Est-mCnc: 126 mg/dL
Hgb A1c MFr Bld: 6 % — ABNORMAL HIGH (ref 4.8–5.6)

## 2015-12-23 NOTE — Telephone Encounter (Signed)
REFILL 

## 2015-12-28 ENCOUNTER — Other Ambulatory Visit: Payer: Self-pay | Admitting: Cardiovascular Disease

## 2015-12-29 ENCOUNTER — Encounter: Payer: Self-pay | Admitting: "Endocrinology

## 2015-12-29 ENCOUNTER — Ambulatory Visit (INDEPENDENT_AMBULATORY_CARE_PROVIDER_SITE_OTHER): Payer: BLUE CROSS/BLUE SHIELD | Admitting: "Endocrinology

## 2015-12-29 VITALS — BP 125/71 | HR 71 | Ht 68.0 in | Wt 186.0 lb

## 2015-12-29 DIAGNOSIS — E785 Hyperlipidemia, unspecified: Secondary | ICD-10-CM | POA: Diagnosis not present

## 2015-12-29 DIAGNOSIS — I10 Essential (primary) hypertension: Secondary | ICD-10-CM | POA: Diagnosis not present

## 2015-12-29 DIAGNOSIS — E1159 Type 2 diabetes mellitus with other circulatory complications: Secondary | ICD-10-CM

## 2015-12-29 MED ORDER — CANAGLIFLOZIN 100 MG PO TABS: 100.0000 mg | ORAL_TABLET | Freq: Every day | ORAL | 1 refills | Status: DC

## 2015-12-29 NOTE — Telephone Encounter (Signed)
REFILL 

## 2015-12-29 NOTE — Progress Notes (Signed)
Subjective:    Patient ID: Francis Hall, male    DOB: 03/13/1956,    Past Medical History:  Diagnosis Date  . Atrial fibrillation ([place])   . CAD (coronary artery disease)    [facility]  . CHF (congestive heart failure) ([place]) 11/08/2010   2D Echo EF=>55%  . Diabetes mellitus    insulin  . Diverticulosis   . Dyspnea   . Family hx of colon cancer   . HTN (hypertension)   . Hyperlipemia   . Hyperplastic colon polyp 07/16/07   30cm   . Myocardial infarction ([place]) 12/2012  . OSA (obstructive sleep apnea) 12/21/2007   sleep study perform REM 28mns REM and NREM was 95.0%, AHI of 12.31hr and RDI of 12.4hr   Past Surgical History:  Procedure Laterality Date  . CARDIAC CATHETERIZATION  2006 2011   2 stents  . CATARACT EXTRACTION W/PHACO Left 12/03/2013   Procedure: CATARACT EXTRACTION PHACO AND INTRAOCULAR LENS PLACEMENT (IOC);  Surgeon: KTonny Branch MD;  Location: [facility];  Service: Ophthalmology;  Laterality: Left;  CDE:7.07  . CATARACT EXTRACTION W/PHACO Right 12/14/2013   Procedure: CATARACT EXTRACTION PHACO AND INTRAOCULAR LENS PLACEMENT (IOC);  Surgeon: KTonny Branch MD;  Location: [facility];  Service: Ophthalmology;  Laterality: Right;  CDE 7.84   . COLONOSCOPY  07/2007   Dr JArnoldo Morale hyperplastic polyp 30cm  . COLONOSCOPY N/A 08/12/2012   Procedure: COLONOSCOPY;  Surgeon: SDanie Binder MD;  Location: [facility];  Service: Endoscopy;  Laterality: N/A;  8:30  . CORONARY ARTERY BYPASS GRAFT  06/2004   x2 performed for 40% left main stenosis, 80% proximal LAD stenosis of the first diagonal artery, and 50% left circumfles artery priximal stenosis.LIMA to LAD and free radial artery to circumflex coronary artery  . LEFT HEART CATHETERIZATION WITH CORONARY/GRAFT ANGIOGRAM  12/29/2012   Procedure: LEFT HEART CATHETERIZATION WITH CBeatrix Fetters  Surgeon: DLeonie Man MD;  [facility];  Service: Cardiovascular;;  . TONSILLECTOMY  1962   Social History   Social  History  . Marital status: Married    Spouse name: N/A  . Number of children: 2  . Years of education: 12   Occupational History  . disabled mDealer    Social History Main Topics  . Smoking status: Never Smoker  . Smokeless tobacco: Never Used  . Alcohol use Yes     Comment: social-2 beers/wk  . Drug use: No  . Sexual activity: Yes    Birth control/ protection: None   Other Topics Concern  . None   Social History Narrative  . None   Outpatient Encounter Prescriptions as of 12/29/2015  Medication Sig  . acetaminophen (TYLENOL) 325 MG tablet Take 2 tablets (650 mg total) by mouth every 4 (four) hours as needed.  .Marland Kitchenaspirin 81 MG EC tablet Take 81 mg by mouth daily.    .Marland Kitchenatorvastatin (LIPITOR) 20 MG tablet Take 1 tablet (20 mg total) by mouth at bedtime.  .Marland KitchenBRILINTA 90 MG TABS tablet TAKE 1 TABLET (90 MG TOTAL) BY MOUTH 2 (TWO) TIMES DAILY.  . canagliflozin (INVOKANA) 100 MG TABS tablet Take 1 tablet (100 mg total) by mouth daily before breakfast.  . carvedilol (COREG) 25 MG tablet Take 25 mg by mouth 2 (two) times daily with a meal.    . Cholecalciferol (VITAMIN D3) 1000 UNITS CAPS Take 2 capsules by mouth daily.  .Marland Kitchendiltiazem (CARTIA XT) 120 MG 24 hr capsule Take 1 capsule (120 mg  total) by mouth daily.  . fenofibrate (TRICOR) 48 MG tablet Take 1 tablet (48 mg total) by mouth daily.  . hydrochlorothiazide (MICROZIDE) 12.5 MG capsule TAKE 1 CAPSULE (12.5 MG TOTAL) BY MOUTH DAILY AS NEEDED.  . hydrochlorothiazide (MICROZIDE) 12.5 MG capsule Take 1 capsule by mouth  daily as needed  . LANTUS SOLOSTAR 100 UNIT/ML Solostar Pen Inject subcutaneously 50  units at bedtime  . lisinopril (PRINIVIL,ZESTRIL) 20 MG tablet TAKE 1 TABLET (20 MG TOTAL) BY MOUTH 2 (TWO) TIMES DAILY.  . metFORMIN (GLUCOPHAGE) 1000 MG tablet Take 1 tablet by mouth two  times daily with a meal  . Multiple Vitamin (MULTIVITAMIN) capsule Take 1 capsule by mouth daily.    . nitroGLYCERIN (NITROSTAT) 0.4 MG SL tablet  Place 0.4 mg under the tongue every 5 (five) minutes as needed for chest pain.  [name] NITROSTAT 0.4 MG SL tablet PLACE 1 TABLET UNDER TONGUE IF NEEDED FOR CHEST PAIN..MAX OF 3 DOSES  . NOVOLOG FLEXPEN 100 UNIT/ML FlexPen Inject 10-16 units  subcutaneously 3 times  daily with meals.  [name] omega-3 acid ethyl esters (LOVAZA) 1 g capsule Take 2 capsules (2 g total) by mouth 2 (two) times daily. NEED OV.  . vitamin C (ASCORBIC ACID) 500 MG tablet Take 500 mg by mouth daily.  . [DISCONTINUED] INVOKANA 300 MG TABS tablet Take 1 tablet by mouth  daily   No facility-administered encounter medications on file as of 12/29/2015.    ALLERGIES: Allergies  Allergen Reactions  . Crestor [Rosuvastatin] Other (See Comments)    myalgia   VACCINATION STATUS:  There is no immunization history on file for this patient.  Diabetes  He presents for his follow-up diabetic visit. He has type 2 diabetes mellitus. Onset time: He was diagnosed at approximate age of 38 years. His disease course has been improving. There are no hypoglycemic associated symptoms. Pertinent negatives for hypoglycemia include no confusion, headaches, pallor or seizures. There are no diabetic associated symptoms. Pertinent negatives for diabetes include no chest pain, no fatigue, no polydipsia, no polyphagia, no polyuria and no weakness. There are no hypoglycemic complications. Symptoms are improving. Diabetic complications include heart disease and peripheral neuropathy. Risk factors for coronary artery disease include dyslipidemia, diabetes mellitus, hypertension and male sex. His weight is stable. He has had a previous visit with a dietitian. He participates in exercise intermittently. His home blood glucose trend is decreasing steadily (He still has significant difficulty maintaining a regular schedule for monitoring and meals. It appears that he is addressing hyperglycemia at random instead of following for insulin chart I gave him.). His breakfast  blood glucose range is generally 140-180 mg/dl. His lunch blood glucose range is generally 140-180 mg/dl. His dinner blood glucose range is generally 140-180 mg/dl. His overall blood glucose range is 140-180 mg/dl. An ACE inhibitor/angiotensin II receptor blocker is being taken. Eye exam is current.  Hypertension  This is a chronic problem. The current episode started more than 1 year ago. The problem has been waxing and waning since onset. Pertinent negatives include no chest pain, headaches, neck pain, palpitations or shortness of breath. Risk factors for coronary artery disease include diabetes mellitus, dyslipidemia, male gender and sedentary lifestyle.  Hyperlipidemia  This is a chronic problem. The current episode started more than 1 year ago. Recent lipid tests were reviewed and are high. Pertinent negatives include no chest pain, myalgias or shortness of breath. Current antihyperlipidemic treatment includes statins.     Review of Systems  Constitutional: Negative for  fatigue and unexpected weight change.  HENT: Negative for dental problem, mouth sores and trouble swallowing.   Eyes: Negative for visual disturbance.  Respiratory: Negative for cough, choking, chest tightness, shortness of breath and wheezing.   Cardiovascular: Negative for chest pain, palpitations and leg swelling.  Gastrointestinal: Negative for abdominal distention, abdominal pain, constipation, diarrhea, nausea and vomiting.  Endocrine: Negative for polydipsia, polyphagia and polyuria.  Genitourinary: Negative for dysuria, flank pain, hematuria and urgency.  Musculoskeletal: Negative for back pain, gait problem, myalgias and neck pain.  Skin: Negative for pallor, rash and wound.  Neurological: Negative for seizures, syncope, weakness, numbness and headaches.  Psychiatric/Behavioral: Negative.  Negative for confusion and dysphoric mood.    Objective:    BP 125/71   Pulse 71   Ht 5' 8" (1.727 m)   Wt 186 lb (84.4  kg)   BMI 28.28 kg/m   Wt Readings from Last 3 Encounters:  12/29/15 186 lb (84.4 kg)  09/20/15 187 lb (84.8 kg)  06/15/15 194 lb (88 kg)    Physical Exam  Constitutional: He is oriented to person, place, and time. He appears well-developed and well-nourished. He is cooperative. No distress.  HENT:  Head: Normocephalic and atraumatic.  Eyes: EOM are normal.  Neck: Normal range of motion. Neck supple. No tracheal deviation present. No thyromegaly present.  Cardiovascular: Normal rate, S1 normal, S2 normal and normal heart sounds.  Exam reveals no gallop.   No murmur heard. Pulses:      Dorsalis pedis pulses are 1+ on the right side, and 1+ on the left side.       Posterior tibial pulses are 1+ on the right side, and 1+ on the left side.  Pulmonary/Chest: Breath sounds normal. No respiratory distress. He has no wheezes.  Abdominal: Soft. Bowel sounds are normal. He exhibits no distension. There is no tenderness. There is no guarding and no CVA tenderness.  Musculoskeletal: He exhibits no edema.       Right shoulder: He exhibits no swelling and no deformity.  Neurological: He is alert and oriented to person, place, and time. He has normal strength and normal reflexes. No cranial nerve deficit or sensory deficit. Gait normal.  Skin: Skin is warm and dry. No rash noted. No cyanosis. Nails show no clubbing.  Psychiatric: He has a normal mood and affect. His speech is normal and behavior is normal. Judgment and thought content normal. Cognition and memory are normal.    Results for orders placed or performed in visit on 09/20/15  CMP14+EGFR  Result Value Ref Range   Glucose 158 (H) 65 - 99 mg/dL   BUN 20 6 - 24 mg/dL   Creatinine, Ser 0.94 0.76 - 1.27 mg/dL   GFR calc non Af Amer 89 >59 mL/min/1.73   GFR calc Af Amer 103 >59 mL/min/1.73   BUN/Creatinine Ratio 21 (H) 9 - 20   Sodium 138 134 - 144 mmol/L   Potassium 4.4 3.5 - 5.2 mmol/L   Chloride 100 96 - 106 mmol/L   CO2 22 18 - 29  mmol/L   Calcium 9.6 8.7 - 10.2 mg/dL   Total Protein 6.5 6.0 - 8.5 g/dL   Albumin 4.3 3.5 - 5.5 g/dL   Globulin, Total 2.2 1.5 - 4.5 g/dL   Albumin/Globulin Ratio 2.0 1.2 - 2.2   Bilirubin Total 0.4 0.0 - 1.2 mg/dL   Alkaline Phosphatase 65 39 - 117 IU/L   AST 16 0 - 40 IU/L   ALT 19 0 -  44 IU/L  Hemoglobin A1c  Result Value Ref Range   Hgb A1c MFr Bld 6.0 (H) 4.8 - 5.6 %   Est. average glucose Bld gHb Est-mCnc 126 mg/dL   Diabetic Labs (most recent): Lab Results  Component Value Date   HGBA1C 6.0 (H) 12/22/2015   HGBA1C 7.4 (H) 09/13/2015   HGBA1C 7.2 (H) 06/09/2015   Lipid Panel     Component Value Date/Time   CHOL 197 06/09/2015 1034   TRIG 287 (H) 06/09/2015 1034   HDL 37 (L) 06/09/2015 1034   CHOLHDL 5.3 (H) 06/09/2015 1034   CHOLHDL 7.0 12/29/2012 0438   VLDL UNABLE TO CALCULATE IF TRIGLYCERIDE OVER 400 mg/dL 12/29/2012 0438   LDLCALC 103 (H) 06/09/2015 1034     Assessment & Plan:   1. Type 2 diabetes mellitus with vascular disease ([place])  His diabetes is  complicated by coronary artery disease status post CABG in 2006 and recent stent placement in 2014. Patient came with Improving glucose profile, and  recent A1c of   6% improving from 7.4%.   Glucose logs and insulin administration records pertaining to this visit,  to be scanned into patient's records.    -He has not been consistent in monitoring and injecting insulin. He missed at least a third of his insulin injection opportunities.  -Recent labs reviewed. - Patient remains at a high risk for more acute and chronic complications of diabetes which include CAD, CVA, CKD, retinopathy, and neuropathy. These are all discussed in detail with the patient.  - I have re-counseled the patient on diet management and weight loss  by adopting a carbohydrate restricted / protein rich  Diet. -We had a long discussion regarding the need for him to stick to the monitoring and insulin administration schedule I gave him. -  Patient is advised to stick to a routine mealtimes to eat 3 meals  a day and avoid unnecessary snacks ( to snack only to correct hypoglycemia).  - Suggestion is made for patient to avoid simple carbohydrates   from their diet including Cakes , Desserts, Ice Cream,  Soda (  diet and regular) , Sweet Tea , Candies,  Chips, Cookies, Artificial Sweeteners,   and "Sugar-free" Products .  This will help patient to have stable blood glucose profile and potentially avoid unintended  Weight gain.  - The patient  has been  scheduled with Jearld Fenton, RDN, CDE for individualized DM education. - I have approached patient with the following individualized plan to manage diabetes and patient agrees.  - I will proceed with basal insulin Lantus 50 units QHS, and lower prandial insulin NovoLog to 8 units TIDAC for pre-meal BG readings of 90-178m/dl, plus patient specific correction dose of rapid acting insulin  for unexpected hyperglycemia above 1544mdl, associated with strict monitoring of glucose  AC and HS. - Patient is warned not to take insulin without proper monitoring per orders. -Adjustment parameters are given for hypo and hyperglycemia in writing. -Patient is encouraged to call clinic for blood glucose levels less than 70 or above 300 mg /dl. - I will continue metformin 1000 mg by mouth twice a day and  lower invokana to 100 mg by mouth every morning, therapeutically suitable for patient.  - Patient specific target  for A1c; LDL, HDL, Triglycerides, and  Waist Circumference were discussed in detail.  2) BP/HTN: Unontrolled. Continue current medications including ACEI. 3) Lipids/HPL: No recent LDL,  continue statins. 4)  Weight/Diet: CDE consult in progress, exercise, and carbohydrates information  provided.  5) Chronic Care/Health Maintenance:  -Patient  Is on ACEI and Statin medications and encouraged to continue to follow up with Ophthalmology, Podiatrist at least yearly or according to  recommendations, and advised to stay away from smoking. I have recommended yearly flu vaccine and pneumonia vaccination at least every 5 years; moderate intensity exercise for up to 150 minutes weekly; and  sleep for at least 7 hours a day.  I advised patient to maintain close follow up with their PCP for primary care needs.  Patient is asked to bring meter and  blood glucose logs during their next visit.   Follow up plan: Return in about 3 months (around 03/30/2016) for meter, and logs.  Glade Lloyd, MD Phone: [phone]  Fax: [phone]   12/29/2015, 1:31 PM

## 2016-01-20 ENCOUNTER — Encounter: Payer: Self-pay | Admitting: Cardiovascular Disease

## 2016-01-20 ENCOUNTER — Encounter (INDEPENDENT_AMBULATORY_CARE_PROVIDER_SITE_OTHER): Payer: Self-pay

## 2016-01-20 ENCOUNTER — Ambulatory Visit (INDEPENDENT_AMBULATORY_CARE_PROVIDER_SITE_OTHER): Payer: Self-pay | Admitting: Cardiovascular Disease

## 2016-01-20 VITALS — BP 108/56 | HR 66 | Ht 68.0 in | Wt 190.0 lb

## 2016-01-20 DIAGNOSIS — E663 Overweight: Secondary | ICD-10-CM

## 2016-01-20 DIAGNOSIS — E785 Hyperlipidemia, unspecified: Secondary | ICD-10-CM

## 2016-01-20 DIAGNOSIS — I10 Essential (primary) hypertension: Secondary | ICD-10-CM

## 2016-01-20 DIAGNOSIS — E1159 Type 2 diabetes mellitus with other circulatory complications: Secondary | ICD-10-CM

## 2016-01-20 DIAGNOSIS — I25708 Atherosclerosis of coronary artery bypass graft(s), unspecified, with other forms of angina pectoris: Secondary | ICD-10-CM

## 2016-01-20 MED ORDER — CARVEDILOL 12.5 MG PO TABS: 12.5000 mg | ORAL_TABLET | Freq: Two times a day (BID) | ORAL | 4 refills | Status: DC

## 2016-01-20 NOTE — Patient Instructions (Addendum)
Medication Instructions:  DECREASE Carvedilol to 12.5mg ; Take 1 tab by mouth twice a day.   Labwork: None Ordered  Testing/Procedures: None Ordered  Follow-Up: Your physician wants you to follow-up in: 12 months with DR CROITORU. You will receive a reminder letter in the mail two months in advance. If you don't receive a letter, please call our office to schedule the follow-up appointment.  Any Other Special Instructions Will Be Listed Below (If Applicable).     If you need a refill on your cardiac medications before your next appointment, please call your pharmacy.

## 2016-01-20 NOTE — Progress Notes (Signed)
Cardiology Office Note    Date:  01/20/2016   ID:  LAVONTE LU, DOB 09-25-1955, MRN [id number]  PCP:  Purvis Kilts, MD  Cardiologist:   Sanda Klein, MD   Chief complaint: CAD   History of Present Illness:  Francis Hall is a 60 y.o. male with coronary artery disease and previous bypass surgery, diastolic heart failure, insulin-requiring diabetes mellitus, hypertension, mixed hyperlipidemia with severe hypertriglyceridemia.  Nature is feeling well. He performs a lot of physical activity in the yard and does not feel at all limited by dyspnea or angina. He does sometimes feel fatigue. He has not had dizziness, syncope or palpitations. He has not had problems with leg edema. His weight has been slowly declining and he has lost roughly 7 pounds since I last saw him. Glycemic control has improved substantially and his last hemoglobin A1c was down to 6.1%. He has not had symptomatic hypoglycemia. His blood pressure is relatively low.  [name] had multivessel bypass surgery in 2006 (LIMA to LAD, free radial to circumflex) and in 2014 presented with a small non-STEMI and required percutaneous revascularization (proximal posterolateral artery 2.25 x 16 mm Promus and mid right coronary artery 2.5 x 38 mm Promus and balloon angioplasty of the ostium of the PDA). He is a "Plavix nonresponder".  Past Medical History:  Diagnosis Date  . Atrial fibrillation ([place])   . CAD (coronary artery disease)    [facility]  . CHF (congestive heart failure) ([place]) 11/08/2010   2D Echo EF=>55%  . Diabetes mellitus    insulin  . Diverticulosis   . Dyspnea   . Family hx of colon cancer   . HTN (hypertension)   . Hyperlipemia   . Hyperplastic colon polyp 07/16/07   30cm   . Myocardial infarction ([place]) 12/2012  . OSA (obstructive sleep apnea) 12/21/2007   sleep study perform REM 57mins REM and NREM was 95.0%, AHI of 12.31hr and RDI of 12.4hr    Past Surgical History:  Procedure Laterality Date  .  CARDIAC CATHETERIZATION  2006 2011   2 stents  . CATARACT EXTRACTION W/PHACO Left 12/03/2013   Procedure: CATARACT EXTRACTION PHACO AND INTRAOCULAR LENS PLACEMENT (IOC);  Surgeon: Tonny Branch, MD;  Location: [facility];  Service: Ophthalmology;  Laterality: Left;  CDE:7.07  . CATARACT EXTRACTION W/PHACO Right 12/14/2013   Procedure: CATARACT EXTRACTION PHACO AND INTRAOCULAR LENS PLACEMENT (IOC);  Surgeon: Tonny Branch, MD;  Location: [facility];  Service: Ophthalmology;  Laterality: Right;  CDE 7.84   . COLONOSCOPY  07/2007   Dr Arnoldo Morale: hyperplastic polyp 30cm  . COLONOSCOPY N/A 08/12/2012   Procedure: COLONOSCOPY;  Surgeon: Danie Binder, MD;  Location: [facility] SU[facility];  Service: Endoscopy;  Laterality: N/A;  8:30  . CORONARY ARTERY BYPASS GRAFT  06/2004   x2 performed for 40% left main stenosis, 80% proximal LAD stenosis of the first diagonal artery, and 50% left circumfles artery priximal stenosis.LIMA to LAD and free radial artery to circumflex coronary artery  . LEFT HEART CATHETERIZATION WITH CORONARY/GRAFT ANGIOGRAM  12/29/2012   Procedure: LEFT HEART CATHETERIZATION WITH Beatrix Fetters;  Surgeon: Leonie Man, MD;  Location: [facility] CATH LAB;  Service: Cardiovascular;;  . TONSILLECTOMY  1962    Current Medications: Outpatient Medications Prior to Visit  Medication Sig Dispense Refill  . aspirin 81 MG EC tablet Take 81 mg by mouth daily.      [name] atorvastatin (LIPITOR) 20 MG tablet Take 1 tablet (20 mg total) by mouth at bedtime.  NEED OV. 90 tablet 0  . BRILINTA 90 MG TABS tablet TAKE 1 TABLET (90 MG TOTAL) BY MOUTH 2 (TWO) TIMES DAILY. 60 tablet 11  . canagliflozin (INVOKANA) 100 MG TABS tablet Take 1 tablet (100 mg total) by mouth daily before breakfast. 90 tablet 1  . Cholecalciferol (VITAMIN D3) 1000 UNITS CAPS Take 2 capsules by mouth daily.    [name] diltiazem (CARTIA XT) 120 MG 24 hr capsule Take 1 capsule (120 mg total) by mouth daily. 30 capsule 0  . fenofibrate (TRICOR) 48 MG tablet Take 1  tablet (48 mg total) by mouth daily. 90 tablet 3  . LANTUS SOLOSTAR 100 UNIT/ML Solostar Pen Inject subcutaneously 50  units at bedtime 45 mL 0  . lisinopril (PRINIVIL,ZESTRIL) 20 MG tablet TAKE 1 TABLET (20 MG TOTAL) BY MOUTH 2 (TWO) TIMES DAILY. 28 tablet 5  . metFORMIN (GLUCOPHAGE) 1000 MG tablet Take 1 tablet by mouth two  times daily with a meal 180 tablet 0  . Multiple Vitamin (MULTIVITAMIN) capsule Take 1 capsule by mouth daily.      [name] NITROSTAT 0.4 MG SL tablet PLACE 1 TABLET UNDER TONGUE IF NEEDED FOR CHEST PAIN..MAX OF 3 DOSES 25 tablet 3  . NOVOLOG FLEXPEN 100 UNIT/ML FlexPen Inject 10-16 units  subcutaneously 3 times  daily with meals. 45 mL 0  . vitamin C (ASCORBIC ACID) 500 MG tablet Take 500 mg by mouth daily.    [name] acetaminophen (TYLENOL) 325 MG tablet Take 2 tablets (650 mg total) by mouth every 4 (four) hours as needed. (Patient taking differently: Take 650 mg by mouth every 4 (four) hours as needed for moderate pain. )    . carvedilol (COREG) 25 MG tablet Take 25 mg by mouth 2 (two) times daily with a meal.      . hydrochlorothiazide (MICROZIDE) 12.5 MG capsule TAKE 1 CAPSULE (12.5 MG TOTAL) BY MOUTH DAILY AS NEEDED. 30 capsule 11  . hydrochlorothiazide (MICROZIDE) 12.5 MG capsule Take 1 capsule by mouth  daily as needed 90 capsule 1  . nitroGLYCERIN (NITROSTAT) 0.4 MG SL tablet Place 0.4 mg under the tongue every 5 (five) minutes as needed for chest pain.    [name] omega-3 acid ethyl esters (LOVAZA) 1 g capsule Take 2 capsules (2 g total) by mouth 2 (two) times daily. NEED OV. (Patient taking differently: Take 2 g by mouth 2 (two) times daily. ) 360 capsule 0   No facility-administered medications prior to visit.      Allergies:   Crestor [rosuvastatin]   Social History   Social History  . Marital status: Married    Spouse name: N/A  . Number of children: 2  . Years of education: 12   Occupational History  . disabled Dealer     Social History Main Topics  . Smoking  status: Never Smoker  . Smokeless tobacco: Never Used  . Alcohol use Yes     Comment: social-2 beers/wk  . Drug use: No  . Sexual activity: Yes    Birth control/ protection: None   Other Topics Concern  . None   Social History Narrative  . None     Family History:  The patient's family history includes Colon cancer in his father; Coronary artery disease (age of onset: 91) in his father; Lung cancer in his father.   ROS:   Please see the history of present illness.    ROS All other systems reviewed and are negative.   PHYSICAL EXAM:   VS:  BP (!) 108/56   Pulse 66   Ht 5\' 8"  (1.727 m)   Wt 190 lb (86.2 kg)   BMI 28.89 kg/m    GEN: Well nourished, well developed, in no acute distress  HEENT: normal  Neck: no JVD, carotid bruits, or masses Cardiac: RRR; no murmurs, rubs, or gallops,no edema  Respiratory:  clear to auscultation bilaterally, normal work of breathing GI: soft, nontender, nondistended, + BS MS: no deformity or atrophy  Skin: warm and dry, no rash Neuro:  Alert and Oriented x 3, Strength and sensation are intact Psych: euthymic mood, full affect  Wt Readings from Last 3 Encounters:  01/20/16 190 lb (86.2 kg)  12/29/15 186 lb (84.4 kg)  09/20/15 187 lb (84.8 kg)      Studies/Labs Reviewed:   EKG:  EKG is ordered today.  The ekg ordered today demonstrates sinus rhythm, QTC 420 ms  Recent Labs: 12/22/2015: ALT 19; BUN 20; Creatinine, Ser 0.94; Potassium 4.4; Sodium 138   Lipid Panel    Component Value Date/Time   CHOL 197 06/09/2015 1034   TRIG 287 (H) 06/09/2015 1034   HDL 37 (L) 06/09/2015 1034   CHOLHDL 5.3 (H) 06/09/2015 1034   CHOLHDL 7.0 12/29/2012 0438   VLDL UNABLE TO CALCULATE IF TRIGLYCERIDE OVER 400 mg/dL 12/29/2012 0438   LDLCALC 103 (H) 06/09/2015 1034    Additional studies/ records that were reviewed today include:  Notes from Dr. Dorris Fetch    ASSESSMENT:    1. Coronary artery disease involving coronary bypass graft of native  heart with other forms of angina pectoris ([place])   2. Essential hypertension   3. Hyperlipidemia   4. Type 2 diabetes mellitus with vascular disease ([place])   5. Overweight      PLAN:  In order of problems listed above:  1. CAD s/p CABG 2006, PCI-RCA 2014: He is asymptomatic despite an active lifestyle. He has a little bit of fatigue which might be secondary to his beta blocker. We'll reduce the carvedilol to 12.5 mg twice a day since he also has a relatively low blood pressure and heart rate. He was a Plavix nonresponder. Will switch to the lower, maintenance dose of Brilinta. 2. HTN: Excellent control, reduce the beta blocker due to fatigue 3. HLP: He used to have severe mixed hyperlipidemia, but all lipid parameters have improved. Ideally we would bring down his LDL cholesterol to less than 70. He is already on a combination of statin and fenofibrate. His glycemic control is now excellent and I suspect that if we were to repeat his labs today all lipid parameters would be better. He is still gradually losing weight. Reevaluate lipid profile in a few months. Might still be a candidate for PCSK9 inhibitors. 4. DM: Now with excellent control. He is no longer obese, although he remains moderately overweight.    Medication Adjustments/Labs and Tests Ordered: Current medicines are reviewed at length with the patient today.  Concerns regarding medicines are outlined above.  Medication changes, Labs and Tests ordered today are listed in the Patient Instructions below. Patient Instructions  Medication Instructions:  DECREASE Carvedilol to 12.5mg ; Take 1 tab by mouth twice a day.   Labwork: None Ordered  Testing/Procedures: None Ordered  Follow-Up: Your physician wants you to follow-up in: 12 months with DR Alaja Goldinger. You will receive a reminder letter in the mail two months in advance. If you don't receive a letter, please call our office to schedule the follow-up appointment.  Any Other  Special  Instructions Will Be Listed Below (If Applicable).     If you need a refill on your cardiac medications before your next appointment, please call your pharmacy.     Signed, Sanda Klein, MD  01/20/2016 1:31 PM    [facility] [place], [place], [place]  [address] Phone: [phone]; Fax: [phone]

## 2016-01-31 ENCOUNTER — Telehealth: Payer: Self-pay

## 2016-01-31 MED ORDER — TICAGRELOR 60 MG PO TABS: 60.0000 mg | ORAL_TABLET | Freq: Two times a day (BID) | ORAL | 3 refills | Status: DC

## 2016-01-31 NOTE — Telephone Encounter (Signed)
-----   Message from Sanda Klein, MD sent at 01/20/2016  1:28 PM EDT ----- I would like to reduce his Brilinta to the maintenance 60 mg twice daily dose please

## 2016-01-31 NOTE — Telephone Encounter (Signed)
Called patient with recommendation. Patient verbalized understanding and agreed with plan.  Medication samples have been provided to the patient. Drug name: Brilinta 60 mg Qty: 42 tabs LOT: [id number]  Exp.Date: 09/2016 Drug name: Brilinta 60 mg Qty: 28 tabs LOT: FM  Exp.Date: 12/2016 Samples left at front desk for patient pick-up. Patient notified.

## 2016-02-20 ENCOUNTER — Other Ambulatory Visit: Payer: Self-pay | Admitting: Cardiovascular Disease

## 2016-03-01 ENCOUNTER — Other Ambulatory Visit: Payer: Self-pay | Admitting: "Endocrinology

## 2016-03-07 ENCOUNTER — Other Ambulatory Visit: Payer: Self-pay | Admitting: Cardiovascular Disease

## 2016-03-12 ENCOUNTER — Other Ambulatory Visit: Payer: Self-pay | Admitting: Cardiovascular Disease

## 2016-03-14 ENCOUNTER — Other Ambulatory Visit: Payer: Self-pay | Admitting: "Endocrinology

## 2016-03-14 ENCOUNTER — Other Ambulatory Visit: Payer: Self-pay | Admitting: Cardiovascular Disease

## 2016-03-30 LAB — CMP14+EGFR
ALBUMIN: 4.3 g/dL (ref 3.5–5.5)
ALK PHOS: 73 IU/L (ref 39–117)
ALT: 19 IU/L (ref 0–44)
AST: 18 IU/L (ref 0–40)
Albumin/Globulin Ratio: 1.6 (ref 1.2–2.2)
BUN / CREAT RATIO: 20 (ref 9–20)
BUN: 22 mg/dL (ref 6–24)
Bilirubin Total: 0.4 mg/dL (ref 0.0–1.2)
CO2: 22 mmol/L (ref 18–29)
CREATININE: 1.08 mg/dL (ref 0.76–1.27)
Calcium: 9.4 mg/dL (ref 8.7–10.2)
Chloride: 100 mmol/L (ref 96–106)
GFR calc non Af Amer: 75 mL/min/{1.73_m2} (ref >59)
GFR, EST AFRICAN AMERICAN: 87 mL/min/{1.73_m2} (ref >59)
GLOBULIN, TOTAL: 2.7 g/dL (ref 1.5–4.5)
Glucose: 144 mg/dL — ABNORMAL HIGH (ref 65–99)
Potassium: 4.3 mmol/L (ref 3.5–5.2)
SODIUM: 140 mmol/L (ref 134–144)
Total Protein: 7 g/dL (ref 6.0–8.5)

## 2016-03-30 LAB — HEMOGLOBIN A1C
Est. average glucose Bld gHb Est-mCnc: 131 mg/dL
Hgb A1c MFr Bld: 6.2 % — ABNORMAL HIGH (ref 4.8–5.6)

## 2016-04-05 ENCOUNTER — Encounter: Payer: Self-pay | Admitting: "Endocrinology

## 2016-04-05 ENCOUNTER — Ambulatory Visit (INDEPENDENT_AMBULATORY_CARE_PROVIDER_SITE_OTHER): Payer: BLUE CROSS/BLUE SHIELD | Admitting: "Endocrinology

## 2016-04-05 ENCOUNTER — Ambulatory Visit: Payer: BLUE CROSS/BLUE SHIELD | Admitting: "Endocrinology

## 2016-04-05 VITALS — BP 140/68 | HR 76 | Resp 18 | Ht 68.0 in | Wt 195.0 lb

## 2016-04-05 DIAGNOSIS — I10 Essential (primary) hypertension: Secondary | ICD-10-CM | POA: Diagnosis not present

## 2016-04-05 DIAGNOSIS — E1159 Type 2 diabetes mellitus with other circulatory complications: Secondary | ICD-10-CM

## 2016-04-05 DIAGNOSIS — E782 Mixed hyperlipidemia: Secondary | ICD-10-CM | POA: Diagnosis not present

## 2016-04-05 NOTE — Telephone Encounter (Signed)
Open phone note in error.  Close note

## 2016-04-05 NOTE — Progress Notes (Signed)
Subjective:    Patient ID: Francis Hall, male    DOB: 1955-08-26,    Past Medical History:  Diagnosis Date  . Atrial fibrillation ([place])   . CAD (coronary artery disease)    [place]  . CHF (congestive heart failure) ([place]) 11/08/2010   2D Echo EF=>55%  . Diabetes mellitus    insulin  . Diverticulosis   . Dyspnea   . Family hx of colon cancer   . HTN (hypertension)   . Hyperlipemia   . Hyperplastic colon polyp 07/16/07   30cm   . Myocardial infarction 12/2012  . OSA (obstructive sleep apnea) 12/21/2007   sleep study perform REM 40mns REM and NREM was 95.0%, AHI of 12.31hr and RDI of 12.4hr   Past Surgical History:  Procedure Laterality Date  . CARDIAC CATHETERIZATION  2006 2011   2 stents  . CATARACT EXTRACTION W/PHACO Left 12/03/2013   Procedure: CATARACT EXTRACTION PHACO AND INTRAOCULAR LENS PLACEMENT (IOC);  Surgeon: KTonny Branch MD;  Location: [facility];  Service: Ophthalmology;  Laterality: Left;  CDE:7.07  . CATARACT EXTRACTION W/PHACO Right 12/14/2013   Procedure: CATARACT EXTRACTION PHACO AND INTRAOCULAR LENS PLACEMENT (IOC);  Surgeon: KTonny Branch MD;  Location: [facility];  Service: Ophthalmology;  Laterality: Right;  CDE 7.84   . COLONOSCOPY  07/2007   Dr JArnoldo Morale hyperplastic polyp 30cm  . COLONOSCOPY N/A 08/12/2012   Procedure: COLONOSCOPY;  Surgeon: SDanie Binder MD;  Location: [facility];  Service: Endoscopy;  Laterality: N/A;  8:30  . CORONARY ARTERY BYPASS GRAFT  06/2004   x2 performed for 40% left main stenosis, 80% proximal LAD stenosis of the first diagonal artery, and 50% left circumfles artery priximal stenosis.LIMA to LAD and free radial artery to circumflex coronary artery  . LEFT HEART CATHETERIZATION WITH CORONARY/GRAFT ANGIOGRAM  12/29/2012   Procedure: LEFT HEART CATHETERIZATION WITH CBeatrix Fetters  Surgeon: DLeonie Man MD;  [facility];  Service: Cardiovascular;;  . TONSILLECTOMY  1962   Social History   Social History   . Marital status: Married    Spouse name: N/A  . Number of children: 2  . Years of education: 12   Occupational History  . disabled mDealer    Social History Main Topics  . Smoking status: Never Smoker  . Smokeless tobacco: Never Used  . Alcohol use Yes     Comment: social-2 beers/wk  . Drug use: No  . Sexual activity: Yes    Birth control/ protection: None   Other Topics Concern  . None   Social History Narrative  . None   Outpatient Encounter Prescriptions as of 04/05/2016  Medication Sig  . acetaminophen (TYLENOL) 325 MG tablet Take 650 mg by mouth every 6 (six) hours as needed for moderate pain or headache.  .Marland Kitchenaspirin 81 MG EC tablet Take 81 mg by mouth daily.    .Marland Kitchenatorvastatin (LIPITOR) 20 MG tablet TAKE 1 TABLET BY MOUTH AT  BEDTIME  . carvedilol (COREG) 12.5 MG tablet Take 1 tablet (12.5 mg total) by mouth 2 (two) times daily with a meal.  . Cholecalciferol (VITAMIN D3) 1000 UNITS CAPS Take 2 capsules by mouth daily.  .Marland Kitchendiltiazem (CARTIA XT) 120 MG 24 hr capsule Take 1 capsule (120 mg total) by mouth daily.  . fenofibrate (TRICOR) 48 MG tablet Take 1 tablet by mouth  daily  . hydrochlorothiazide (MICROZIDE) 12.5 MG capsule Take 12.5 mg by mouth as needed.  . INVOKANA 100 MG TABS  tablet TAKE 1 TABLET BY MOUTH  DAILY BEFORE BREAKFAST  . LANTUS SOLOSTAR 100 UNIT/ML Solostar Pen INJECT SUBCUTANEOUSLY 50  UNITS AT BEDTIME  . lisinopril (PRINIVIL,ZESTRIL) 20 MG tablet TAKE 1 TABLET (20 MG TOTAL) BY MOUTH 2 (TWO) TIMES DAILY.  . metFORMIN (GLUCOPHAGE) 1000 MG tablet TAKE 1 TABLET BY MOUTH TWO  TIMES DAILY WITH A MEAL  . Multiple Vitamin (MULTIVITAMIN) capsule Take 1 capsule by mouth daily.    [name] NITROSTAT 0.4 MG SL tablet PLACE 1 TABLET UNDER TONGUE IF NEEDED FOR CHEST PAIN..MAX OF 3 DOSES  . NOVOLOG FLEXPEN 100 UNIT/ML FlexPen Inject 10-16 units  subcutaneously 3 times  daily with meals.  [name] omega-3 acid ethyl esters (LOVAZA) 1 g capsule TAKE 2 CAPSULES BY MOUTH  TWICE DAILY   . ticagrelor (BRILINTA) 60 MG TABS tablet Take 1 tablet (60 mg total) by mouth 2 (two) times daily.  . vitamin C (ASCORBIC ACID) 500 MG tablet Take 500 mg by mouth daily.  . [DISCONTINUED] CARTIA XT 120 MG 24 hr capsule TAKE 1 CAPSULE BY MOUTH  DAILY   No facility-administered encounter medications on file as of 04/05/2016.    ALLERGIES: Allergies  Allergen Reactions  . Crestor [Rosuvastatin] Other (See Comments)    myalgia   VACCINATION STATUS:  There is no immunization history on file for this patient.  Diabetes  He presents for his follow-up diabetic visit. He has type 2 diabetes mellitus. Onset time: He was diagnosed at approximate age of 60 years. His disease course has been stable. There are no hypoglycemic associated symptoms. Pertinent negatives for hypoglycemia include no confusion, headaches, pallor or seizures. There are no diabetic associated symptoms. Pertinent negatives for diabetes include no chest pain, no fatigue, no polydipsia, no polyphagia, no polyuria and no weakness. There are no hypoglycemic complications. Symptoms are stable. Diabetic complications include heart disease and peripheral neuropathy. Risk factors for coronary artery disease include dyslipidemia, diabetes mellitus, hypertension and male sex. His weight is stable. He is following a generally unhealthy diet. He has had a previous visit with a dietitian. He participates in exercise intermittently. There is no change (He still has fasting hyperglycemia driven mainly by heavy carbohydrate ingestion at supper.) in his home blood glucose trend. His breakfast blood glucose range is generally 140-180 mg/dl. His lunch blood glucose range is generally 140-180 mg/dl. His dinner blood glucose range is generally 140-180 mg/dl. His overall blood glucose range is 140-180 mg/dl. An ACE inhibitor/angiotensin II receptor blocker is being taken. Eye exam is current.  Hypertension  This is a chronic problem. The current episode  started more than 1 year ago. The problem has been waxing and waning since onset. Pertinent negatives include no chest pain, headaches, neck pain, palpitations or shortness of breath. Risk factors for coronary artery disease include diabetes mellitus, dyslipidemia, male gender and sedentary lifestyle.  Hyperlipidemia  This is a chronic problem. The current episode started more than 1 year ago. Recent lipid tests were reviewed and are high. Pertinent negatives include no chest pain, myalgias or shortness of breath. Current antihyperlipidemic treatment includes statins.     Review of Systems  Constitutional: Negative for fatigue and unexpected weight change.  HENT: Negative for dental problem, mouth sores and trouble swallowing.   Eyes: Negative for visual disturbance.  Respiratory: Negative for cough, choking, chest tightness, shortness of breath and wheezing.   Cardiovascular: Negative for chest pain, palpitations and leg swelling.  Gastrointestinal: Negative for abdominal distention, abdominal pain, constipation, diarrhea, nausea and vomiting.  Endocrine: Negative for polydipsia, polyphagia and polyuria.  Genitourinary: Negative for dysuria, flank pain, hematuria and urgency.  Musculoskeletal: Negative for back pain, gait problem, myalgias and neck pain.  Skin: Negative for pallor, rash and wound.  Neurological: Negative for seizures, syncope, weakness, numbness and headaches.  Psychiatric/Behavioral: Negative.  Negative for confusion and dysphoric mood.    Objective:    BP 140/68   Pulse 76   Resp 18   Ht '5\' 8"'  (1.727 m)   Wt 195 lb (88.5 kg)   SpO2 96%   BMI 29.65 kg/m   Wt Readings from Last 3 Encounters:  04/05/16 195 lb (88.5 kg)  01/20/16 190 lb (86.2 kg)  12/29/15 186 lb (84.4 kg)    Physical Exam  Constitutional: He is oriented to person, place, and time. He appears well-developed and well-nourished. He is cooperative. No distress.  HENT:  Head: Normocephalic and  atraumatic.  Eyes: EOM are normal.  Neck: Normal range of motion. Neck supple. No tracheal deviation present. No thyromegaly present.  Cardiovascular: Normal rate, S1 normal, S2 normal and normal heart sounds.  Exam reveals no gallop.   No murmur heard. Pulses:      Dorsalis pedis pulses are 1+ on the right side, and 1+ on the left side.       Posterior tibial pulses are 1+ on the right side, and 1+ on the left side.  Pulmonary/Chest: Breath sounds normal. No respiratory distress. He has no wheezes.  Abdominal: Soft. Bowel sounds are normal. He exhibits no distension. There is no tenderness. There is no guarding and no CVA tenderness.  Musculoskeletal: He exhibits no edema.       Right shoulder: He exhibits no swelling and no deformity.  Neurological: He is alert and oriented to person, place, and time. He has normal strength and normal reflexes. No cranial nerve deficit or sensory deficit. Gait normal.  Skin: Skin is warm and dry. No rash noted. No cyanosis. Nails show no clubbing.  Psychiatric: He has a normal mood and affect. His speech is normal and behavior is normal. Judgment and thought content normal. Cognition and memory are normal.    Results for orders placed or performed in visit on 12/29/15  Hemoglobin A1c  Result Value Ref Range   Hgb A1c MFr Bld 6.2 (H) 4.8 - 5.6 %   Est. average glucose Bld gHb Est-mCnc 131 mg/dL  CMP14+EGFR  Result Value Ref Range   Glucose 144 (H) 65 - 99 mg/dL   BUN 22 6 - 24 mg/dL   Creatinine, Ser 1.08 0.76 - 1.27 mg/dL   GFR calc non Af Amer 75 >59 mL/min/1.73   GFR calc Af Amer 87 >59 mL/min/1.73   BUN/Creatinine Ratio 20 9 - 20   Sodium 140 134 - 144 mmol/L   Potassium 4.3 3.5 - 5.2 mmol/L   Chloride 100 96 - 106 mmol/L   CO2 22 18 - 29 mmol/L   Calcium 9.4 8.7 - 10.2 mg/dL   Total Protein 7.0 6.0 - 8.5 g/dL   Albumin 4.3 3.5 - 5.5 g/dL   Globulin, Total 2.7 1.5 - 4.5 g/dL   Albumin/Globulin Ratio 1.6 1.2 - 2.2   Bilirubin Total 0.4 0.0  - 1.2 mg/dL   Alkaline Phosphatase 73 39 - 117 IU/L   AST 18 0 - 40 IU/L   ALT 19 0 - 44 IU/L   Diabetic Labs (most recent): Lab Results  Component Value Date   HGBA1C 6.2 (H) 03/29/2016   HGBA1C  6.0 (H) 12/22/2015   HGBA1C 7.4 (H) 09/13/2015   Lipid Panel     Component Value Date/Time   CHOL 197 06/09/2015 1034   TRIG 287 (H) 06/09/2015 1034   HDL 37 (L) 06/09/2015 1034   CHOLHDL 5.3 (H) 06/09/2015 1034   CHOLHDL 7.0 12/29/2012 0438   VLDL UNABLE TO CALCULATE IF TRIGLYCERIDE OVER 400 mg/dL 12/29/2012 0438   LDLCALC 103 (H) 06/09/2015 1034     Assessment & Plan:   1. Type 2 diabetes mellitus with vascular disease ([place])  His diabetes is  complicated by coronary artery disease status post CABG in 2006 and recent stent placement in 2014. Patient came with Improving glucose profile, and  recent A1c of   6% improving from 7.4%.   Glucose logs and insulin administration records pertaining to this visit,  to be scanned into patient's records.    -He has not been consistent in monitoring and injecting insulin. He missed at least a third of his insulin injection opportunities.  -Recent labs reviewed. - Patient remains at a high risk for more acute and chronic complications of diabetes which include CAD, CVA, CKD, retinopathy, and neuropathy. These are all discussed in detail with the patient.  - I have re-counseled the patient on diet management and weight loss  by adopting a carbohydrate restricted / protein rich  Diet. -We had a long discussion regarding the need for him to stick to the monitoring and insulin administration schedule I gave him. - Patient is advised to stick to a routine mealtimes to eat 3 meals  a day and avoid unnecessary snacks ( to snack only to correct hypoglycemia).  - Suggestion is made for patient to avoid simple carbohydrates   from their diet including Cakes , Desserts, Ice Cream,  Soda (  diet and regular) , Sweet Tea , Candies,  Chips, Cookies, Artificial  Sweeteners,   and "Sugar-free" Products .  This will help patient to have stable blood glucose profile and potentially avoid unintended  Weight gain.  - The patient  has been  scheduled with Jearld Fenton, RDN, CDE for individualized DM education. - I have approached patient with the following individualized plan to manage diabetes and patient agrees.  - I will proceed with basal insulin Lantus 50 units QHS, and lower prandial insulin NovoLog to 8 units TIDAC for pre-meal BG readings of 90-174m/dl, plus patient specific correction dose of rapid acting insulin  for unexpected hyperglycemia above 1531mdl, associated with strict monitoring of glucose  AC and HS. - Patient is warned not to take insulin without proper monitoring per orders. -Adjustment parameters are given for hypo and hyperglycemia in writing. -Patient is encouraged to call clinic for blood glucose levels less than 70 or above 300 mg /dl. - I will continue metformin 1000 mg by mouth twice a day and  Invokana  100 mg by mouth every morning, therapeutically suitable for patient.  - Patient specific target  for A1c; LDL, HDL, Triglycerides, and  Waist Circumference were discussed in detail.  2) BP/HTN: Unontrolled. Continue current medications including ACEI. 3) Lipids/HPL: No recent LDL,  continue statins. 4)  Weight/Diet: CDE consult in progress, exercise, and carbohydrates information provided.  5) Chronic Care/Health Maintenance:  -Patient  Is on ACEI and Statin medications and encouraged to continue to follow up with Ophthalmology, Podiatrist at least yearly or according to recommendations, and advised to stay away from smoking. I have recommended yearly flu vaccine and pneumonia vaccination at least every 5 years; moderate  intensity exercise for up to 150 minutes weekly; and  sleep for at least 7 hours a day.  I advised patient to maintain close follow up with their PCP for primary care needs.  Patient is asked to bring meter  and  blood glucose logs during their next visit.   Follow up plan: Return in about 3 months (around 07/06/2016) for meter, and logs.  Glade Lloyd, MD Phone: [phone]  Fax: [phone]   04/05/2016, 11:27 AM

## 2016-04-05 NOTE — Patient Instructions (Signed)

## 2016-05-07 ENCOUNTER — Other Ambulatory Visit: Payer: Self-pay | Admitting: "Endocrinology

## 2016-06-02 ENCOUNTER — Other Ambulatory Visit: Payer: Self-pay | Admitting: Cardiovascular Disease

## 2016-06-05 NOTE — Telephone Encounter (Signed)
Rx has been sent to the pharmacy electronically. ° °

## 2016-06-16 ENCOUNTER — Other Ambulatory Visit: Payer: Self-pay | Admitting: "Endocrinology

## 2016-07-04 ENCOUNTER — Other Ambulatory Visit: Payer: Self-pay | Admitting: "Endocrinology

## 2016-07-05 LAB — COMPREHENSIVE METABOLIC PANEL
A/G RATIO: 1.8 (ref 1.2–2.2)
ALT: 26 IU/L (ref 0–44)
AST: 18 IU/L (ref 0–40)
Albumin: 4.4 g/dL (ref 3.5–5.5)
Alkaline Phosphatase: 73 IU/L (ref 39–117)
BUN/Creatinine Ratio: 19 (ref 9–20)
BUN: 16 mg/dL (ref 6–24)
Bilirubin Total: 0.3 mg/dL (ref 0.0–1.2)
CO2: 22 mmol/L (ref 18–29)
CREATININE: 0.86 mg/dL (ref 0.76–1.27)
Calcium: 9.5 mg/dL (ref 8.7–10.2)
Chloride: 102 mmol/L (ref 96–106)
GFR calc non Af Amer: 95 mL/min/{1.73_m2} (ref >59)
GFR, EST AFRICAN AMERICAN: 110 mL/min/{1.73_m2} (ref >59)
Globulin, Total: 2.5 g/dL (ref 1.5–4.5)
Glucose: 143 mg/dL — ABNORMAL HIGH (ref 65–99)
POTASSIUM: 4.1 mmol/L (ref 3.5–5.2)
Sodium: 140 mmol/L (ref 134–144)
TOTAL PROTEIN: 6.9 g/dL (ref 6.0–8.5)

## 2016-07-05 LAB — HGB A1C W/O EAG: Hgb A1c MFr Bld: 7 % — ABNORMAL HIGH (ref 4.8–5.6)

## 2016-07-11 ENCOUNTER — Encounter: Payer: Self-pay | Admitting: "Endocrinology

## 2016-07-11 ENCOUNTER — Ambulatory Visit (INDEPENDENT_AMBULATORY_CARE_PROVIDER_SITE_OTHER): Payer: BLUE CROSS/BLUE SHIELD | Admitting: "Endocrinology

## 2016-07-11 VITALS — BP 146/74 | HR 94 | Ht 68.0 in | Wt 199.0 lb

## 2016-07-11 DIAGNOSIS — E782 Mixed hyperlipidemia: Secondary | ICD-10-CM

## 2016-07-11 DIAGNOSIS — E1159 Type 2 diabetes mellitus with other circulatory complications: Secondary | ICD-10-CM | POA: Diagnosis not present

## 2016-07-11 DIAGNOSIS — I10 Essential (primary) hypertension: Secondary | ICD-10-CM

## 2016-07-11 NOTE — Patient Instructions (Signed)

## 2016-07-11 NOTE — Progress Notes (Signed)
Subjective:    Patient ID: Francis Hall, male    DOB: [birth year],    Past Medical History:  Diagnosis Date  . Atrial fibrillation ([place])   . CAD (coronary artery disease)    [place]  . CHF (congestive heart failure) ([place]) 11/08/2010   2D Echo EF=>55%  . Diabetes mellitus    insulin  . Diverticulosis   . Dyspnea   . Family hx of colon cancer   . HTN (hypertension)   . Hyperlipemia   . Hyperplastic colon polyp 07/16/07   30cm   . Myocardial infarction 12/2012  . OSA (obstructive sleep apnea) 12/21/2007   sleep study perform REM 74mins REM and NREM was 95.0%, AHI of 12.31hr and RDI of 12.4hr   Past Surgical History:  Procedure Laterality Date  . CARDIAC CATHETERIZATION  2006 2011   2 stents  . CATARACT EXTRACTION W/PHACO Left 12/03/2013   Procedure: CATARACT EXTRACTION PHACO AND INTRAOCULAR LENS PLACEMENT (IOC);  Surgeon: Tonny Branch, MD;  Location: [facility];  Service: Ophthalmology;  Laterality: Left;  CDE:7.07  . CATARACT EXTRACTION W/PHACO Right 12/14/2013   Procedure: CATARACT EXTRACTION PHACO AND INTRAOCULAR LENS PLACEMENT (IOC);  Surgeon: Tonny Branch, MD;  Location: [facility];  Service: Ophthalmology;  Laterality: Right;  CDE 7.84   . COLONOSCOPY  07/2007   Dr Arnoldo Morale: hyperplastic polyp 30cm  . COLONOSCOPY N/A 08/12/2012   Procedure: COLONOSCOPY;  Surgeon: Danie Binder, MD;  Location: [facility] SUITE;  Service: Endoscopy;  Laterality: N/A;  8:30  . CORONARY ARTERY BYPASS GRAFT  06/2004   x2 performed for 40% left main stenosis, 80% proximal LAD stenosis of the first diagonal artery, and 50% left circumfles artery priximal stenosis.LIMA to LAD and free radial artery to circumflex coronary artery  . LEFT HEART CATHETERIZATION WITH CORONARY/GRAFT ANGIOGRAM  12/29/2012   Procedure: LEFT HEART CATHETERIZATION WITH Beatrix Fetters;  Surgeon: Leonie Man, MD;  Location: [facility] CATH LAB;  Service: Cardiovascular;;  . TONSILLECTOMY  1962   Social History   Social History   . Marital status: Married    Spouse name: N/A  . Number of children: 2  . Years of education: 12   Occupational History  . disabled Dealer     Social History Main Topics  . Smoking status: Never Smoker  . Smokeless tobacco: Never Used  . Alcohol use Yes     Comment: social-2 beers/wk  . Drug use: No  . Sexual activity: Yes    Birth control/ protection: None   Other Topics Concern  . None   Social History Narrative  . None   Outpatient Encounter Prescriptions as of 07/11/2016  Medication Sig  . acetaminophen (TYLENOL) 325 MG tablet Take 650 mg by mouth every 6 (six) hours as needed for moderate pain or headache.  [name] aspirin 81 MG EC tablet Take 81 mg by mouth daily.    [name] atorvastatin (LIPITOR) 20 MG tablet TAKE 1 TABLET BY MOUTH AT  BEDTIME  . CARTIA XT 120 MG 24 hr capsule TAKE 1 CAPSULE BY MOUTH  DAILY  . carvedilol (COREG) 12.5 MG tablet Take 1 tablet (12.5 mg total) by mouth 2 (two) times daily with a meal.  . Cholecalciferol (VITAMIN D3) 1000 UNITS CAPS Take 2 capsules by mouth daily.  . fenofibrate (TRICOR) 48 MG tablet Take 1 tablet by mouth  daily  . hydrochlorothiazide (MICROZIDE) 12.5 MG capsule Take 12.5 mg by mouth as needed.  . INVOKANA 100 MG TABS tablet TAKE 1  TABLET BY MOUTH  DAILY BEFORE BREAKFAST  . LANTUS SOLOSTAR 100 UNIT/ML Solostar Pen INJECT SUBCUTANEOUSLY 50  UNITS AT BEDTIME  . lisinopril (PRINIVIL,ZESTRIL) 20 MG tablet TAKE 1 TABLET (20 MG TOTAL) BY MOUTH 2 (TWO) TIMES DAILY.  . metFORMIN (GLUCOPHAGE) 1000 MG tablet TAKE 1 TABLET BY MOUTH TWO  TIMES DAILY WITH A MEAL  . Multiple Vitamin (MULTIVITAMIN) capsule Take 1 capsule by mouth daily.    [name] NITROSTAT 0.4 MG SL tablet PLACE 1 TABLET UNDER TONGUE IF NEEDED FOR CHEST PAIN..MAX OF 3 DOSES  . NOVOLOG FLEXPEN 100 UNIT/ML FlexPen INJECT 10 TO 16 UNITS  SUBCUTANEOUSLY 3 TIMES  DAILY WITH MEALS  . omega-3 acid ethyl esters (LOVAZA) 1 g capsule TAKE 2 CAPSULES BY MOUTH  TWICE DAILY  . ticagrelor (BRILINTA)  60 MG TABS tablet Take 1 tablet (60 mg total) by mouth 2 (two) times daily.  . vitamin C (ASCORBIC ACID) 500 MG tablet Take 500 mg by mouth daily.   No facility-administered encounter medications on file as of 07/11/2016.    ALLERGIES: Allergies  Allergen Reactions  . Crestor [Rosuvastatin] Other (See Comments)    myalgia   VACCINATION STATUS:  There is no immunization history on file for this patient.  Diabetes  He presents for his follow-up diabetic visit. He has type 2 diabetes mellitus. Onset time: He was diagnosed at approximate age of 56 years. His disease course has been stable. There are no hypoglycemic associated symptoms. Pertinent negatives for hypoglycemia include no confusion, headaches, pallor or seizures. There are no diabetic associated symptoms. Pertinent negatives for diabetes include no chest pain, no fatigue, no polydipsia, no polyphagia, no polyuria and no weakness. There are no hypoglycemic complications. Symptoms are stable. Diabetic complications include heart disease and peripheral neuropathy. Risk factors for coronary artery disease include dyslipidemia, diabetes mellitus, hypertension and male sex. His weight is increasing steadily. He is following a generally unhealthy diet. He has had a previous visit with a dietitian. He participates in exercise intermittently. There is no change (He still has fasting hyperglycemia driven mainly by heavy carbohydrate ingestion at supper.) in his home blood glucose trend. His breakfast blood glucose range is generally 140-180 mg/dl. His lunch blood glucose range is generally 140-180 mg/dl. His dinner blood glucose range is generally 140-180 mg/dl. His overall blood glucose range is 140-180 mg/dl. An ACE inhibitor/angiotensin II receptor blocker is being taken. Eye exam is current.  Hypertension  This is a chronic problem. The current episode started more than 1 year ago. The problem has been waxing and waning since onset. Pertinent  negatives include no chest pain, headaches, neck pain, palpitations or shortness of breath. Risk factors for coronary artery disease include diabetes mellitus, dyslipidemia, male gender and sedentary lifestyle.  Hyperlipidemia  This is a chronic problem. The current episode started more than 1 year ago. Recent lipid tests were reviewed and are high. Pertinent negatives include no chest pain, myalgias or shortness of breath. Current antihyperlipidemic treatment includes statins.     Review of Systems  Constitutional: Negative for fatigue and unexpected weight change.  HENT: Negative for dental problem, mouth sores and trouble swallowing.   Eyes: Negative for visual disturbance.  Respiratory: Negative for cough, choking, chest tightness, shortness of breath and wheezing.   Cardiovascular: Negative for chest pain, palpitations and leg swelling.  Gastrointestinal: Negative for abdominal distention, abdominal pain, constipation, diarrhea, nausea and vomiting.  Endocrine: Negative for polydipsia, polyphagia and polyuria.  Genitourinary: Negative for dysuria, flank pain, hematuria and  urgency.  Musculoskeletal: Negative for back pain, gait problem, myalgias and neck pain.  Skin: Negative for pallor, rash and wound.  Neurological: Negative for seizures, syncope, weakness, numbness and headaches.  Psychiatric/Behavioral: Negative.  Negative for confusion and dysphoric mood.    Objective:    BP (!) 146/74   Pulse 94   Ht 5\' 8"  (1.727 m)   Wt 199 lb (90.3 kg)   BMI 30.26 kg/m   Wt Readings from Last 3 Encounters:  07/11/16 199 lb (90.3 kg)  04/05/16 195 lb (88.5 kg)  01/20/16 190 lb (86.2 kg)    Physical Exam  Constitutional: He is oriented to person, place, and time. He appears well-developed and well-nourished. He is cooperative. No distress.  HENT:  Head: Normocephalic and atraumatic.  Eyes: EOM are normal.  Neck: Normal range of motion. Neck supple. No tracheal deviation present. No  thyromegaly present.  Cardiovascular: Normal rate, S1 normal, S2 normal and normal heart sounds.  Exam reveals no gallop.   No murmur heard. Pulses:      Dorsalis pedis pulses are 1+ on the right side, and 1+ on the left side.       Posterior tibial pulses are 1+ on the right side, and 1+ on the left side.  Pulmonary/Chest: Breath sounds normal. No respiratory distress. He has no wheezes.  Abdominal: Soft. Bowel sounds are normal. He exhibits no distension. There is no tenderness. There is no guarding and no CVA tenderness.  Musculoskeletal: He exhibits no edema.       Right shoulder: He exhibits no swelling and no deformity.  Neurological: He is alert and oriented to person, place, and time. He has normal strength and normal reflexes. No cranial nerve deficit or sensory deficit. Gait normal.  Skin: Skin is warm and dry. No rash noted. No cyanosis. Nails show no clubbing.  Psychiatric: He has a normal mood and affect. His speech is normal and behavior is normal. Judgment and thought content normal. Cognition and memory are normal.    Results for orders placed or performed in visit on 07/04/16  Comprehensive metabolic panel  Result Value Ref Range   Glucose 143 (H) 65 - 99 mg/dL   BUN 16 6 - 24 mg/dL   Creatinine, Ser 0.86 0.76 - 1.27 mg/dL   GFR calc non Af Amer 95 >59 mL/min/1.73   GFR calc Af Amer 110 >59 mL/min/1.73   BUN/Creatinine Ratio 19 9 - 20   Sodium 140 134 - 144 mmol/L   Potassium 4.1 3.5 - 5.2 mmol/L   Chloride 102 96 - 106 mmol/L   CO2 22 18 - 29 mmol/L   Calcium 9.5 8.7 - 10.2 mg/dL   Total Protein 6.9 6.0 - 8.5 g/dL   Albumin 4.4 3.5 - 5.5 g/dL   Globulin, Total 2.5 1.5 - 4.5 g/dL   Albumin/Globulin Ratio 1.8 1.2 - 2.2   Bilirubin Total 0.3 0.0 - 1.2 mg/dL   Alkaline Phosphatase 73 39 - 117 IU/L   AST 18 0 - 40 IU/L   ALT 26 0 - 44 IU/L  Hgb A1c w/o eAG  Result Value Ref Range   Hgb A1c MFr Bld 7.0 (H) 4.8 - 5.6 %   Diabetic Labs (most recent): Lab Results   Component Value Date   HGBA1C 7.0 (H) 07/04/2016   HGBA1C 6.2 (H) 03/29/2016   HGBA1C 6.0 (H) 12/22/2015   Lipid Panel     Component Value Date/Time   CHOL 197 06/09/2015 1034   TRIG  287 (H) 06/09/2015 1034   HDL 37 (L) 06/09/2015 1034   CHOLHDL 5.3 (H) 06/09/2015 1034   CHOLHDL 7.0 12/29/2012 0438   VLDL UNABLE TO CALCULATE IF TRIGLYCERIDE OVER 400 mg/dL 12/29/2012 0438   LDLCALC 103 (H) 06/09/2015 1034     Assessment & Plan:   1. Type 2 diabetes mellitus with vascular disease ([place])  His diabetes is  complicated by coronary artery disease status post CABG in 2006 and recent stent placement in 2014. Patient came with Near target glucose profile, and  recent A1c of 7% .   Glucose logs and insulin administration records pertaining to this visit,  to be scanned into patient's records.    -He has not been consistent in monitoring and injecting insulin. He missed at least a third of his insulin injection opportunities.  -Recent labs reviewed. - Patient remains at a high risk for more acute and chronic complications of diabetes which include CAD, CVA, CKD, retinopathy, and neuropathy. These are all discussed in detail with the patient.  - I have re-counseled the patient on diet management and weight loss  by adopting a carbohydrate restricted / protein rich  Diet. -We had a long discussion regarding the need for him to stick to the monitoring and insulin administration schedule I gave him. - Patient is advised to stick to a routine mealtimes to eat 3 meals  a day and avoid unnecessary snacks ( to snack only to correct hypoglycemia).  - Suggestion is made for patient to avoid simple carbohydrates   from their diet including Cakes , Desserts, Ice Cream,  Soda (  diet and regular) , Sweet Tea , Candies,  Chips, Cookies, Artificial Sweeteners,   and "Sugar-free" Products .  This will help patient to have stable blood glucose profile and potentially avoid unintended  Weight gain.  - The  patient  has been  scheduled with Jearld Fenton, RDN, CDE for individualized DM education. - I have approached patient with the following individualized plan to manage diabetes and patient agrees.  - I will proceed with basal insulin Lantus 50 units QHS, and prandial insulin NovoLog to 8 units TIDAC for pre-meal BG readings of 90-150mg /dl, plus patient specific correction dose of rapid acting insulin  for unexpected hyperglycemia above 150mg /dl, associated with strict monitoring of glucose  AC and HS. - Patient is warned not to take insulin without proper monitoring per orders. -Adjustment parameters are given for hypo and hyperglycemia in writing. -Patient is encouraged to call clinic for blood glucose levels less than 70 or above 300 mg /dl. - I will continue metformin 1000 mg by mouth twice a day and  Invokana  100 mg by mouth every morning, therapeutically suitable for patient. Side effects and precautions discussed with him.  - Patient specific target  for A1c; LDL, HDL, Triglycerides, and  Waist Circumference were discussed in detail.  2) BP/HTN: Unontrolled. Continue current medications including ACEI. 3) Lipids/HPL: No recent LDL,  continue statins. 4)  Weight/Diet: CDE consult in progress, exercise, and carbohydrates information provided.  5) Chronic Care/Health Maintenance:  -Patient  Is on ACEI and Statin medications and encouraged to continue to follow up with Ophthalmology, Podiatrist at least yearly or according to recommendations, and advised to stay away from smoking. I have recommended yearly flu vaccine and pneumonia vaccination at least every 5 years; moderate intensity exercise for up to 150 minutes weekly; and  sleep for at least 7 hours a day.  I advised patient to maintain close follow  up with their PCP for primary care needs.  Patient is asked to bring meter and  blood glucose logs during their next visit.   Follow up plan: Return in about 3 months (around 10/08/2016)  for meter, and logs.  Glade Lloyd, MD Phone: [phone]  Fax: [phone]   07/11/2016, 11:57 AM

## 2016-08-09 ENCOUNTER — Other Ambulatory Visit: Payer: Self-pay | Admitting: Cardiovascular Disease

## 2016-08-09 ENCOUNTER — Other Ambulatory Visit: Payer: Self-pay | Admitting: "Endocrinology

## 2016-08-09 NOTE — Telephone Encounter (Signed)
Rx(s) sent to pharmacy electronically.  

## 2016-08-16 ENCOUNTER — Telehealth: Payer: Self-pay | Admitting: Cardiovascular Disease

## 2016-08-16 NOTE — Telephone Encounter (Signed)
Received Physicians Statement from [facility] for Dr Sallyanne Kuster to review, complete and sign.  Received via fax - no AUTH/Pmt to [place].  Sent to [place] @ [facility] for letter to be sent to patient and process. lp

## 2016-08-23 ENCOUNTER — Other Ambulatory Visit: Payer: Self-pay | Admitting: "Endocrinology

## 2016-08-23 ENCOUNTER — Other Ambulatory Visit: Payer: Self-pay | Admitting: Cardiovascular Disease

## 2016-10-09 ENCOUNTER — Other Ambulatory Visit: Payer: Self-pay | Admitting: *Deleted

## 2016-10-09 ENCOUNTER — Ambulatory Visit: Payer: BLUE CROSS/BLUE SHIELD | Admitting: "Endocrinology

## 2016-10-09 MED ORDER — HYDROCHLOROTHIAZIDE 12.5 MG PO CAPS: 12.5000 mg | ORAL_CAPSULE | ORAL | 0 refills | Status: DC | PRN

## 2016-10-11 LAB — CMP14+EGFR
ALK PHOS: 92 IU/L (ref 39–117)
ALT: 51 IU/L — AB (ref 0–44)
AST: 31 IU/L (ref 0–40)
Albumin/Globulin Ratio: 1.7 (ref 1.2–2.2)
Albumin: 4.4 g/dL (ref 3.5–5.5)
BUN/Creatinine Ratio: 21 — ABNORMAL HIGH (ref 9–20)
BUN: 22 mg/dL (ref 6–24)
Bilirubin Total: 0.4 mg/dL (ref 0.0–1.2)
CO2: 24 mmol/L (ref 18–29)
CREATININE: 1.06 mg/dL (ref 0.76–1.27)
Calcium: 9.4 mg/dL (ref 8.7–10.2)
Chloride: 97 mmol/L (ref 96–106)
GFR calc Af Amer: 88 mL/min/{1.73_m2} (ref >59)
GFR calc non Af Amer: 76 mL/min/{1.73_m2} (ref >59)
Globulin, Total: 2.6 g/dL (ref 1.5–4.5)
Glucose: 190 mg/dL — ABNORMAL HIGH (ref 65–99)
Potassium: 4.4 mmol/L (ref 3.5–5.2)
SODIUM: 139 mmol/L (ref 134–144)
Total Protein: 7 g/dL (ref 6.0–8.5)

## 2016-10-11 LAB — HEMOGLOBIN A1C
Est. average glucose Bld gHb Est-mCnc: 143 mg/dL
HEMOGLOBIN A1C: 6.6 % — AB (ref 4.8–5.6)

## 2016-10-11 LAB — LIPID PANEL
Chol/HDL Ratio: 9.3 ratio — ABNORMAL HIGH (ref 0.0–5.0)
Cholesterol, Total: 269 mg/dL — ABNORMAL HIGH (ref 100–199)
HDL: 29 mg/dL — AB (ref >39)
TRIGLYCERIDES: 589 mg/dL — AB (ref 0–149)

## 2016-10-17 ENCOUNTER — Encounter: Payer: Self-pay | Admitting: "Endocrinology

## 2016-10-17 ENCOUNTER — Ambulatory Visit (INDEPENDENT_AMBULATORY_CARE_PROVIDER_SITE_OTHER): Payer: BLUE CROSS/BLUE SHIELD | Admitting: "Endocrinology

## 2016-10-17 VITALS — BP 151/79 | HR 82 | Ht 68.0 in | Wt 189.0 lb

## 2016-10-17 DIAGNOSIS — E782 Mixed hyperlipidemia: Secondary | ICD-10-CM | POA: Diagnosis not present

## 2016-10-17 DIAGNOSIS — I10 Essential (primary) hypertension: Secondary | ICD-10-CM

## 2016-10-17 DIAGNOSIS — E1159 Type 2 diabetes mellitus with other circulatory complications: Secondary | ICD-10-CM | POA: Diagnosis not present

## 2016-10-17 MED ORDER — ATORVASTATIN CALCIUM 20 MG PO TABS: 20.0000 mg | ORAL_TABLET | Freq: Every day | ORAL | 2 refills | Status: DC

## 2016-10-17 NOTE — Progress Notes (Signed)
Subjective:    Patient ID: Francis Hall, male    DOB: [birth year],    Past Medical History:  Diagnosis Date  . Atrial fibrillation ([place])   . CAD (coronary artery disease)    [facility]  . CHF (congestive heart failure) ([place]) 11/08/2010   2D Echo EF=>55%  . Diabetes mellitus    insulin  . Diverticulosis   . Dyspnea   . Family hx of colon cancer   . HTN (hypertension)   . Hyperlipemia   . Hyperplastic colon polyp 07/16/07   30cm   . Myocardial infarction ([place]) 12/2012  . OSA (obstructive sleep apnea) 12/21/2007   sleep study perform REM 50mns REM and NREM was 95.0%, AHI of 12.31hr and RDI of 12.4hr   Past Surgical History:  Procedure Laterality Date  . CARDIAC CATHETERIZATION  2006 2011   2 stents  . CATARACT EXTRACTION W/PHACO Left 12/03/2013   Procedure: CATARACT EXTRACTION PHACO AND INTRAOCULAR LENS PLACEMENT (IOC);  Surgeon: KTonny Branch MD;  Location: [facility];  Service: Ophthalmology;  Laterality: Left;  CDE:7.07  . CATARACT EXTRACTION W/PHACO Right 12/14/2013   Procedure: CATARACT EXTRACTION PHACO AND INTRAOCULAR LENS PLACEMENT (IOC);  Surgeon: KTonny Branch MD;  Location: [facility];  Service: Ophthalmology;  Laterality: Right;  CDE 7.84   . COLONOSCOPY  07/2007   Dr JArnoldo Morale hyperplastic polyp 30cm  . COLONOSCOPY N/A 08/12/2012   Procedure: COLONOSCOPY;  Surgeon: SDanie Binder MD;  Location: [facility];  Service: Endoscopy;  Laterality: N/A;  8:30  . CORONARY ARTERY BYPASS GRAFT  06/2004   x2 performed for 40% left main stenosis, 80% proximal LAD stenosis of the first diagonal artery, and 50% left circumfles artery priximal stenosis.LIMA to LAD and free radial artery to circumflex coronary artery  . LEFT HEART CATHETERIZATION WITH CORONARY/GRAFT ANGIOGRAM  12/29/2012   Procedure: LEFT HEART CATHETERIZATION WITH CBeatrix Fetters  Surgeon: DLeonie Man MD;  [facility];  Service: Cardiovascular;;  . TONSILLECTOMY  1962   Social History   Social  History  . Marital status: Married    Spouse name: N/A  . Number of children: 2  . Years of education: 12   Occupational History  . disabled mDealer    Social History Main Topics  . Smoking status: Never Smoker  . Smokeless tobacco: Never Used  . Alcohol use Yes     Comment: social-2 beers/wk  . Drug use: No  . Sexual activity: Yes    Birth control/ protection: None   Other Topics Concern  . None   Social History Narrative  . None   Outpatient Encounter Prescriptions as of 10/17/2016  Medication Sig  . acetaminophen (TYLENOL) 325 MG tablet Take 650 mg by mouth every 6 (six) hours as needed for moderate pain or headache.  .Marland Kitchenaspirin 81 MG EC tablet Take 81 mg by mouth daily.    .Marland Kitchenatorvastatin (LIPITOR) 20 MG tablet Take 1 tablet (20 mg total) by mouth at bedtime.  .Marland KitchenCARTIA XT 120 MG 24 hr capsule TAKE 1 CAPSULE BY MOUTH  DAILY  . carvedilol (COREG) 12.5 MG tablet Take 1 tablet (12.5 mg total) by mouth 2 (two) times daily with a meal.  . Cholecalciferol (VITAMIN D3) 1000 UNITS CAPS Take 2 capsules by mouth daily.  . fenofibrate (TRICOR) 48 MG tablet TAKE 1 TABLET BY MOUTH  DAILY  . hydrochlorothiazide (MICROZIDE) 12.5 MG capsule Take 1 capsule (12.5 mg total) by mouth as needed.  .Anastasio Auerbach  100 MG TABS tablet TAKE 1 TABLET BY MOUTH  DAILY BEFORE BREAKFAST  . LANTUS SOLOSTAR 100 UNIT/ML Solostar Pen INJECT SUBCUTANEOUSLY 50  UNITS AT BEDTIME  . lisinopril (PRINIVIL,ZESTRIL) 20 MG tablet TAKE 1 TABLET (20 MG TOTAL) BY MOUTH 2 (TWO) TIMES DAILY.  . metFORMIN (GLUCOPHAGE) 1000 MG tablet TAKE 1 TABLET BY MOUTH TWO  TIMES DAILY WITH A MEAL  . Multiple Vitamin (MULTIVITAMIN) capsule Take 1 capsule by mouth daily.    [name] NITROSTAT 0.4 MG SL tablet PLACE 1 TABLET UNDER TONGUE IF NEEDED FOR CHEST PAIN..MAX OF 3 DOSES  . NOVOLOG FLEXPEN 100 UNIT/ML FlexPen INJECT 10 TO 16 UNITS  SUBCUTANEOUSLY 3 TIMES  DAILY WITH MEALS  . omega-3 acid ethyl esters (LOVAZA) 1 g capsule TAKE 2 CAPSULES BY  MOUTH  TWICE DAILY  . ticagrelor (BRILINTA) 60 MG TABS tablet Take 1 tablet (60 mg total) by mouth 2 (two) times daily.  . vitamin C (ASCORBIC ACID) 500 MG tablet Take 500 mg by mouth daily.  . [DISCONTINUED] atorvastatin (LIPITOR) 20 MG tablet TAKE 1 TABLET BY MOUTH AT  BEDTIME   No facility-administered encounter medications on file as of 10/17/2016.    ALLERGIES: Allergies  Allergen Reactions  . Crestor [Rosuvastatin] Other (See Comments)    myalgia   VACCINATION STATUS:  There is no immunization history on file for this patient.  Diabetes  He presents for his follow-up diabetic visit. He has type 2 diabetes mellitus. Onset time: He was diagnosed at approximate age of 65 years. His disease course has been improving. There are no hypoglycemic associated symptoms. Pertinent negatives for hypoglycemia include no confusion, headaches, pallor or seizures. There are no diabetic associated symptoms. Pertinent negatives for diabetes include no chest pain, no fatigue, no polydipsia, no polyphagia, no polyuria and no weakness. There are no hypoglycemic complications. Symptoms are improving. Diabetic complications include heart disease and peripheral neuropathy. Risk factors for coronary artery disease include dyslipidemia, diabetes mellitus, hypertension and male sex. His weight is increasing steadily. He is following a generally unhealthy diet. He has had a previous visit with a dietitian. He participates in exercise intermittently. There is no change (He still has fasting hyperglycemia driven mainly by heavy carbohydrate ingestion at supper.) in his home blood glucose trend. His breakfast blood glucose range is generally 140-180 mg/dl. His lunch blood glucose range is generally 140-180 mg/dl. His dinner blood glucose range is generally 140-180 mg/dl. His overall blood glucose range is 140-180 mg/dl. An ACE inhibitor/angiotensin II receptor blocker is being taken. Eye exam is current.  Hypertension   This is a chronic problem. The current episode started more than 1 year ago. The problem has been waxing and waning since onset. Pertinent negatives include no chest pain, headaches, neck pain, palpitations or shortness of breath. Risk factors for coronary artery disease include diabetes mellitus, dyslipidemia, male gender and sedentary lifestyle.  Hyperlipidemia  This is a chronic problem. The current episode started more than 1 year ago. Recent lipid tests were reviewed and are high. Pertinent negatives include no chest pain, myalgias or shortness of breath. Current antihyperlipidemic treatment includes statins.     Review of Systems  Constitutional: Negative for fatigue and unexpected weight change.  HENT: Negative for dental problem, mouth sores and trouble swallowing.   Eyes: Negative for visual disturbance.  Respiratory: Negative for cough, choking, chest tightness, shortness of breath and wheezing.   Cardiovascular: Negative for chest pain, palpitations and leg swelling.  Gastrointestinal: Negative for abdominal distention, abdominal pain,  constipation, diarrhea, nausea and vomiting.  Endocrine: Negative for polydipsia, polyphagia and polyuria.  Genitourinary: Negative for dysuria, flank pain, hematuria and urgency.  Musculoskeletal: Negative for back pain, gait problem, myalgias and neck pain.  Skin: Negative for pallor, rash and wound.  Neurological: Negative for seizures, syncope, weakness, numbness and headaches.  Psychiatric/Behavioral: Negative.  Negative for confusion and dysphoric mood.    Objective:    BP (!) 151/79   Pulse 82   Ht '5\' 8"'  (1.727 m)   Wt 189 lb (85.7 kg)   BMI 28.74 kg/m   Wt Readings from Last 3 Encounters:  10/17/16 189 lb (85.7 kg)  07/11/16 199 lb (90.3 kg)  04/05/16 195 lb (88.5 kg)    Physical Exam  Constitutional: He is oriented to person, place, and time. He appears well-developed and well-nourished. He is cooperative. No distress.  HENT:   Head: Normocephalic and atraumatic.  Eyes: EOM are normal.  Neck: Normal range of motion. Neck supple. No tracheal deviation present. No thyromegaly present.  Cardiovascular: Normal rate, S1 normal, S2 normal and normal heart sounds.  Exam reveals no gallop.   No murmur heard. Pulses:      Dorsalis pedis pulses are 1+ on the right side, and 1+ on the left side.       Posterior tibial pulses are 1+ on the right side, and 1+ on the left side.  Pulmonary/Chest: Breath sounds normal. No respiratory distress. He has no wheezes.  Abdominal: Soft. Bowel sounds are normal. He exhibits no distension. There is no tenderness. There is no guarding and no CVA tenderness.  Musculoskeletal: He exhibits no edema.       Right shoulder: He exhibits no swelling and no deformity.  Neurological: He is alert and oriented to person, place, and time. He has normal strength and normal reflexes. No cranial nerve deficit or sensory deficit. Gait normal.  Skin: Skin is warm and dry. No rash noted. No cyanosis. Nails show no clubbing.  Psychiatric: He has a normal mood and affect. His speech is normal and behavior is normal. Judgment and thought content normal. Cognition and memory are normal.    Results for orders placed or performed in visit on 07/11/16  CMP14+EGFR  Result Value Ref Range   Glucose 190 (H) 65 - 99 mg/dL   BUN 22 6 - 24 mg/dL   Creatinine, Ser 1.06 0.76 - 1.27 mg/dL   GFR calc non Af Amer 76 >59 mL/min/1.73   GFR calc Af Amer 88 >59 mL/min/1.73   BUN/Creatinine Ratio 21 (H) 9 - 20   Sodium 139 134 - 144 mmol/L   Potassium 4.4 3.5 - 5.2 mmol/L   Chloride 97 96 - 106 mmol/L   CO2 24 18 - 29 mmol/L   Calcium 9.4 8.7 - 10.2 mg/dL   Total Protein 7.0 6.0 - 8.5 g/dL   Albumin 4.4 3.5 - 5.5 g/dL   Globulin, Total 2.6 1.5 - 4.5 g/dL   Albumin/Globulin Ratio 1.7 1.2 - 2.2   Bilirubin Total 0.4 0.0 - 1.2 mg/dL   Alkaline Phosphatase 92 39 - 117 IU/L   AST 31 0 - 40 IU/L   ALT 51 (H) 0 - 44 IU/L   Hemoglobin A1c  Result Value Ref Range   Hgb A1c MFr Bld 6.6 (H) 4.8 - 5.6 %   Est. average glucose Bld gHb Est-mCnc 143 mg/dL  Lipid panel  Result Value Ref Range   Cholesterol, Total 269 (H) 100 - 199 mg/dL  Triglycerides 589 (HH) 0 - 149 mg/dL   HDL 29 (L) >39 mg/dL   VLDL Cholesterol Cal Comment 5 - 40 mg/dL   LDL Calculated Comment 0 - 99 mg/dL   Chol/HDL Ratio 9.3 (H) 0.0 - 5.0 ratio   Diabetic Labs (most recent): Lab Results  Component Value Date   HGBA1C 6.6 (H) 10/10/2016   HGBA1C 7.0 (H) 07/04/2016   HGBA1C 6.2 (H) 03/29/2016     Assessment & Plan:   1. Type 2 diabetes mellitus with vascular disease ([place])  His diabetes is  complicated by coronary artery disease status post CABG in 2006 and recent stent placement in 2014. Patient came with Near target glucose profile, and  recent A1c of 6.6% .   Glucose logs and insulin administration records pertaining to this visit,  to be scanned into patient's records.    -He has not been consistent in monitoring and injecting insulin. He missed at least a third of his insulin injection opportunities.  -Recent labs reviewed. - Patient remains at a high risk for more acute and chronic complications of diabetes which include CAD, CVA, CKD, retinopathy, and neuropathy. These are all discussed in detail with the patient.  - I have re-counseled the patient on diet management and weight loss  by adopting a carbohydrate restricted / protein rich  Diet. -We had a long discussion regarding the need for him to stick to the monitoring and insulin administration schedule I gave him. - Patient is advised to stick to a routine mealtimes to eat 3 meals  a day and avoid unnecessary snacks ( to snack only to correct hypoglycemia).  - Suggestion is made for patient to avoid simple carbohydrates   from their diet including Cakes , Desserts, Ice Cream,  Soda (  diet and regular) , Sweet Tea , Candies,  Chips, Cookies, Artificial Sweeteners,   and  "Sugar-free" Products .  This will help patient to have stable blood glucose profile and potentially avoid unintended  Weight gain.  - The patient  has been  scheduled with Jearld Fenton, RDN, CDE for individualized DM education. - I have approached patient with the following individualized plan to manage diabetes and patient agrees.  - I will continue with basal insulin Lantus 50 units QHS, and prandial insulin NovoLog to 8 units TIDAC for pre-meal BG readings of 90-176m/dl, plus patient specific correction dose of rapid acting insulin  for unexpected hyperglycemia above 1578mdl, associated with strict monitoring of glucose  AC and HS. - Patient is warned not to take insulin without proper monitoring per orders. -Adjustment parameters are given for hypo and hyperglycemia in writing. -Patient is encouraged to call clinic for blood glucose levels less than 70 or above 300 mg /dl. - I will continue metformin 1000 mg by mouth twice a day and  Invokana  100 mg by mouth every morning, therapeutically suitable for patient. Side effects and precautions discussed with him.  - Patient specific target  for A1c; LDL, HDL, Triglycerides, and  Waist Circumference were discussed in detail.  2) BP/HTN: Unontrolled. Continue current medications including ACEI. 3) Lipids/HPL: Uncontrolled with triglycerides at 589, reportedly improving from 1700. I urged him to be consistent with his Lovaza 2 g by mouth twice a day, fenofibrate 145 mg by mouth daily, and resume Lipitor 20 mg by mouth daily at bedtime.  4)  Weight/Diet: CDE consult in progress, exercise, and carbohydrates information provided.  5) Chronic Care/Health Maintenance:  -Patient  Is on ACEI and Statin medications  and encouraged to continue to follow up with Ophthalmology, Podiatrist at least yearly or according to recommendations, and advised to stay away from smoking. I have recommended yearly flu vaccine and pneumonia vaccination at least every 5  years; moderate intensity exercise for up to 150 minutes weekly; and  sleep for at least 7 hours a day.  I advised patient to maintain close follow up with their PCP for primary care needs.  Patient is asked to bring meter and  blood glucose logs during their next visit.   Follow up plan: Return in about 3 months (around 01/17/2017) for meter, and logs.  Glade Lloyd, MD Phone: [phone]  Fax: [phone]   10/17/2016, 3:56 PM

## 2016-10-20 ENCOUNTER — Other Ambulatory Visit: Payer: Self-pay | Admitting: "Endocrinology

## 2016-11-08 ENCOUNTER — Other Ambulatory Visit: Payer: Self-pay | Admitting: "Endocrinology

## 2016-11-08 MED ORDER — METFORMIN HCL 1000 MG PO TABS: ORAL_TABLET | ORAL | 0 refills | Status: DC

## 2016-12-06 ENCOUNTER — Other Ambulatory Visit: Payer: Self-pay | Admitting: Cardiovascular Disease

## 2016-12-14 ENCOUNTER — Other Ambulatory Visit: Payer: Self-pay | Admitting: Cardiovascular Disease

## 2017-01-12 LAB — CMP14+EGFR
ALBUMIN: 4.8 g/dL (ref 3.5–5.5)
ALT: 26 IU/L (ref 0–44)
AST: 24 IU/L (ref 0–40)
Albumin/Globulin Ratio: 2.1 (ref 1.2–2.2)
Alkaline Phosphatase: 69 IU/L (ref 39–117)
BILIRUBIN TOTAL: 0.5 mg/dL (ref 0.0–1.2)
BUN / CREAT RATIO: 21 — AB (ref 9–20)
BUN: 24 mg/dL (ref 6–24)
CHLORIDE: 103 mmol/L (ref 96–106)
CO2: 22 mmol/L (ref 20–29)
Calcium: 9.7 mg/dL (ref 8.7–10.2)
Creatinine, Ser: 1.13 mg/dL (ref 0.76–1.27)
GFR calc Af Amer: 82 mL/min/{1.73_m2} (ref >59)
GFR calc non Af Amer: 71 mL/min/{1.73_m2} (ref >59)
GLOBULIN, TOTAL: 2.3 g/dL (ref 1.5–4.5)
GLUCOSE: 164 mg/dL — AB (ref 65–99)
Potassium: 4.3 mmol/L (ref 3.5–5.2)
SODIUM: 139 mmol/L (ref 134–144)
Total Protein: 7.1 g/dL (ref 6.0–8.5)

## 2017-01-12 LAB — TSH: TSH: 2.46 u[IU]/mL (ref 0.450–4.500)

## 2017-01-12 LAB — T4, FREE: Free T4: 1.15 ng/dL (ref 0.82–1.77)

## 2017-01-18 ENCOUNTER — Ambulatory Visit (INDEPENDENT_AMBULATORY_CARE_PROVIDER_SITE_OTHER): Payer: BLUE CROSS/BLUE SHIELD | Admitting: "Endocrinology

## 2017-01-18 ENCOUNTER — Encounter: Payer: Self-pay | Admitting: "Endocrinology

## 2017-01-18 VITALS — BP 110/63 | HR 74 | Ht 68.0 in | Wt 189.0 lb

## 2017-01-18 DIAGNOSIS — E782 Mixed hyperlipidemia: Secondary | ICD-10-CM | POA: Diagnosis not present

## 2017-01-18 DIAGNOSIS — E1159 Type 2 diabetes mellitus with other circulatory complications: Secondary | ICD-10-CM

## 2017-01-18 DIAGNOSIS — I10 Essential (primary) hypertension: Secondary | ICD-10-CM | POA: Diagnosis not present

## 2017-01-18 MED ORDER — METFORMIN HCL 1000 MG PO TABS: ORAL_TABLET | ORAL | 0 refills | Status: DC

## 2017-01-18 MED ORDER — DAPAGLIFLOZIN PROPANEDIOL 5 MG PO TABS: 5.0000 mg | ORAL_TABLET | Freq: Every day | ORAL | 2 refills | Status: DC

## 2017-01-18 MED ORDER — INSULIN GLARGINE 100 UNIT/ML SOLOSTAR PEN: PEN_INJECTOR | SUBCUTANEOUS | 0 refills | Status: DC

## 2017-01-18 MED ORDER — INSULIN ASPART 100 UNIT/ML FLEXPEN: PEN_INJECTOR | SUBCUTANEOUS | 0 refills | Status: DC

## 2017-01-18 NOTE — Patient Instructions (Signed)

## 2017-01-18 NOTE — Progress Notes (Signed)
Subjective:    Patient ID: Francis Hall, male    DOB: November 21, 1955,    Past Medical History:  Diagnosis Date  . Atrial fibrillation ([place])   . CAD (coronary artery disease)    [facility]  . CHF (congestive heart failure) ([place]) 11/08/2010   2D Echo EF=>55%  . Diabetes mellitus    insulin  . Diverticulosis   . Dyspnea   . Family hx of colon cancer   . HTN (hypertension)   . Hyperlipemia   . Hyperplastic colon polyp 07/16/07   30cm   . Myocardial infarction ([place]) 12/2012  . OSA (obstructive sleep apnea) 12/21/2007   sleep study perform REM 80mns REM and NREM was 95.0%, AHI of 12.31hr and RDI of 12.4hr   Past Surgical History:  Procedure Laterality Date  . CARDIAC CATHETERIZATION  2006 2011   2 stents  . CATARACT EXTRACTION W/PHACO Left 12/03/2013   Procedure: CATARACT EXTRACTION PHACO AND INTRAOCULAR LENS PLACEMENT (IOC);  Surgeon: KTonny Branch MD;  Location: [facility];  Service: Ophthalmology;  Laterality: Left;  CDE:7.07  . CATARACT EXTRACTION W/PHACO Right 12/14/2013   Procedure: CATARACT EXTRACTION PHACO AND INTRAOCULAR LENS PLACEMENT (IOC);  Surgeon: KTonny Branch MD;  Location: [facility];  Service: Ophthalmology;  Laterality: Right;  CDE 7.84   . COLONOSCOPY  07/2007   Dr JArnoldo Morale hyperplastic polyp 30cm  . COLONOSCOPY N/A 08/12/2012   Procedure: COLONOSCOPY;  Surgeon: SDanie Binder MD;  Location: [facility];  Service: Endoscopy;  Laterality: N/A;  8:30  . CORONARY ARTERY BYPASS GRAFT  06/2004   x2 performed for 40% left main stenosis, 80% proximal LAD stenosis of the first diagonal artery, and 50% left circumfles artery priximal stenosis.LIMA to LAD and free radial artery to circumflex coronary artery  . LEFT HEART CATHETERIZATION WITH CORONARY/GRAFT ANGIOGRAM  12/29/2012   Procedure: LEFT HEART CATHETERIZATION WITH CBeatrix Fetters  Surgeon: DLeonie Man MD;  [facility];  Service: Cardiovascular;;  . TONSILLECTOMY  1962   Social History   Social  History  . Marital status: Married    Spouse name: N/A  . Number of children: 2  . Years of education: 12   Occupational History  . disabled mDealer    Social History Main Topics  . Smoking status: Never Smoker  . Smokeless tobacco: Never Used  . Alcohol use Yes     Comment: social-2 beers/wk  . Drug use: No  . Sexual activity: Yes    Birth control/ protection: None   Other Topics Concern  . None   Social History Narrative  . None   Outpatient Encounter Prescriptions as of 01/18/2017  Medication Sig  . acetaminophen (TYLENOL) 325 MG tablet Take 650 mg by mouth every 6 (six) hours as needed for moderate pain or headache.  .Marland Kitchenaspirin 81 MG EC tablet Take 81 mg by mouth daily.    .Marland Kitchenatorvastatin (LIPITOR) 20 MG tablet Take 1 tablet (20 mg total) by mouth at bedtime.  .Marland KitchenBRILINTA 60 MG TABS tablet TAKE 1 TABLET BY MOUTH TWO  TIMES DAILY  . CARTIA XT 120 MG 24 hr capsule TAKE 1 CAPSULE BY MOUTH  DAILY  . carvedilol (COREG) 12.5 MG tablet Take 1 tablet (12.5 mg total) by mouth 2 (two) times daily with a meal.  . Cholecalciferol (VITAMIN D3) 1000 UNITS CAPS Take 2 capsules by mouth daily.  . dapagliflozin propanediol (FARXIGA) 5 MG TABS tablet Take 5 mg by mouth daily.  . fenofibrate (  TRICOR) 48 MG tablet TAKE 1 TABLET BY MOUTH  DAILY  . hydrochlorothiazide (MICROZIDE) 12.5 MG capsule TAKE 1 CAPSULE BY MOUTH AS  NEEDED.  [name] insulin aspart (NOVOLOG FLEXPEN) 100 UNIT/ML FlexPen INJECT 10 TO 16 UNITS  SUBCUTANEOUSLY 3 TIMES  DAILY WITH MEALS  . Insulin Glargine (LANTUS SOLOSTAR) 100 UNIT/ML Solostar Pen INJECT SUBCUTANEOUSLY 50  UNITS AT BEDTIME  . lisinopril (PRINIVIL,ZESTRIL) 20 MG tablet TAKE 1 TABLET (20 MG TOTAL) BY MOUTH 2 (TWO) TIMES DAILY.  . metFORMIN (GLUCOPHAGE) 1000 MG tablet TAKE 1 TABLET BY MOUTH TWO  TIMES DAILY WITH A MEAL  . Multiple Vitamin (MULTIVITAMIN) capsule Take 1 capsule by mouth daily.    [name] NITROSTAT 0.4 MG SL tablet PLACE 1 TABLET UNDER TONGUE IF NEEDED FOR  CHEST PAIN..MAX OF 3 DOSES  . omega-3 acid ethyl esters (LOVAZA) 1 g capsule TAKE 2 CAPSULES BY MOUTH  TWICE DAILY  . vitamin C (ASCORBIC ACID) 500 MG tablet Take 500 mg by mouth daily.  . [DISCONTINUED] INVOKANA 100 MG TABS tablet TAKE 1 TABLET BY MOUTH  DAILY BEFORE BREAKFAST  . [DISCONTINUED] LANTUS SOLOSTAR 100 UNIT/ML Solostar Pen INJECT SUBCUTANEOUSLY 50  UNITS AT BEDTIME  . [DISCONTINUED] metFORMIN (GLUCOPHAGE) 1000 MG tablet TAKE 1 TABLET BY MOUTH TWO  TIMES DAILY WITH A MEAL  . [DISCONTINUED] NOVOLOG FLEXPEN 100 UNIT/ML FlexPen INJECT 10 TO 16 UNITS  SUBCUTANEOUSLY 3 TIMES  DAILY WITH MEALS   No facility-administered encounter medications on file as of 01/18/2017.    ALLERGIES: Allergies  Allergen Reactions  . Crestor [Rosuvastatin] Other (See Comments)    myalgia   VACCINATION STATUS:  There is no immunization history on file for this patient.  Diabetes  He presents for his follow-up diabetic visit. He has type 2 diabetes mellitus. Onset time: He was diagnosed at approximate age of 3 years. His disease course has been stable. There are no hypoglycemic associated symptoms. Pertinent negatives for hypoglycemia include no confusion, headaches, pallor or seizures. There are no diabetic associated symptoms. Pertinent negatives for diabetes include no chest pain, no fatigue, no polydipsia, no polyphagia, no polyuria and no weakness. There are no hypoglycemic complications. Symptoms are stable. Diabetic complications include heart disease and peripheral neuropathy. Risk factors for coronary artery disease include dyslipidemia, diabetes mellitus, hypertension and male sex. His weight is stable. He is following a generally unhealthy diet. He has had a previous visit with a dietitian. He participates in exercise intermittently. There is no change (He still has fasting hyperglycemia driven mainly by heavy carbohydrate ingestion at supper, and evening snacks.) in his home blood glucose trend. His  breakfast blood glucose range is generally 140-180 mg/dl. His lunch blood glucose range is generally 140-180 mg/dl. His dinner blood glucose range is generally 140-180 mg/dl. His overall blood glucose range is 140-180 mg/dl. An ACE inhibitor/angiotensin II receptor blocker is being taken. Eye exam is current.  Hypertension  This is a chronic problem. The current episode started more than 1 year ago. The problem has been waxing and waning since onset. Pertinent negatives include no chest pain, headaches, neck pain, palpitations or shortness of breath. Risk factors for coronary artery disease include diabetes mellitus, dyslipidemia, male gender and sedentary lifestyle.  Hyperlipidemia  This is a chronic problem. The current episode started more than 1 year ago. Recent lipid tests were reviewed and are high. Pertinent negatives include no chest pain, myalgias or shortness of breath. Current antihyperlipidemic treatment includes statins.     Review of Systems  Constitutional:  Negative for fatigue and unexpected weight change.  HENT: Negative for dental problem, mouth sores and trouble swallowing.   Eyes: Negative for visual disturbance.  Respiratory: Negative for cough, choking, chest tightness, shortness of breath and wheezing.   Cardiovascular: Negative for chest pain, palpitations and leg swelling.  Gastrointestinal: Negative for abdominal distention, abdominal pain, constipation, diarrhea, nausea and vomiting.  Endocrine: Negative for polydipsia, polyphagia and polyuria.  Genitourinary: Negative for dysuria, flank pain, hematuria and urgency.  Musculoskeletal: Negative for back pain, gait problem, myalgias and neck pain.  Skin: Negative for pallor, rash and wound.  Neurological: Negative for seizures, syncope, weakness, numbness and headaches.  Psychiatric/Behavioral: Negative.  Negative for confusion and dysphoric mood.    Objective:    BP 110/63   Pulse 74   Ht _0  (1.727 m)   Wt 189  lb (85.7 kg)   BMI 28.74 kg/m   Wt Readings from Last 3 Encounters:  01/18/17 189 lb (85.7 kg)  10/17/16 189 lb (85.7 kg)  07/11/16 199 lb (90.3 kg)    Physical Exam  Constitutional: He is oriented to person, place, and time. He appears well-developed and well-nourished. He is cooperative. No distress.  HENT:  Head: Normocephalic and atraumatic.  Eyes: EOM are normal.  Neck: Normal range of motion. Neck supple. No tracheal deviation present. No thyromegaly present.  Cardiovascular: Normal rate, S1 normal, S2 normal and normal heart sounds.  Exam reveals no gallop.   No murmur heard. Pulses:      Dorsalis pedis pulses are 1+ on the right side, and 1+ on the left side.       Posterior tibial pulses are 1+ on the right side, and 1+ on the left side.  Pulmonary/Chest: Breath sounds normal. No respiratory distress. He has no wheezes.  Abdominal: Soft. Bowel sounds are normal. He exhibits no distension. There is no tenderness. There is no guarding and no CVA tenderness.  Musculoskeletal: He exhibits no edema.       Right shoulder: He exhibits no swelling and no deformity.  Neurological: He is alert and oriented to person, place, and time. He has normal strength and normal reflexes. No cranial nerve deficit or sensory deficit. Gait normal.  Skin: Skin is warm and dry. No rash noted. No cyanosis. Nails show no clubbing.  Psychiatric: He has a normal mood and affect. His speech is normal and behavior is normal. Judgment and thought content normal. Cognition and memory are normal.    Results for orders placed or performed in visit on 10/17/16  CMP14+EGFR  Result Value Ref Range   Glucose 164 (H) 65 - 99 mg/dL   BUN 24 6 - 24 mg/dL   Creatinine, Ser 1.13 0.76 - 1.27 mg/dL   GFR calc non Af Amer 71 >59 mL/min/1.73   GFR calc Af Amer 82 >59 mL/min/1.73   BUN/Creatinine Ratio 21 (H) 9 - 20   Sodium 139 134 - 144 mmol/L   Potassium 4.3 3.5 - 5.2 mmol/L   Chloride 103 96 - 106 mmol/L   CO2 22  20 - 29 mmol/L   Calcium 9.7 8.7 - 10.2 mg/dL   Total Protein 7.1 6.0 - 8.5 g/dL   Albumin 4.8 3.5 - 5.5 g/dL   Globulin, Total 2.3 1.5 - 4.5 g/dL   Albumin/Globulin Ratio 2.1 1.2 - 2.2   Bilirubin Total 0.5 0.0 - 1.2 mg/dL   Alkaline Phosphatase 69 39 - 117 IU/L   AST 24 0 - 40 IU/L   ALT  26 0 - 44 IU/L  T4, free  Result Value Ref Range   Free T4 1.15 0.82 - 1.77 ng/dL  TSH  Result Value Ref Range   TSH 2.460 0.450 - 4.500 uIU/mL   Diabetic Labs (most recent): Lab Results  Component Value Date   HGBA1C 6.6 (H) 10/10/2016   HGBA1C 7.0 (H) 07/04/2016   HGBA1C 6.2 (H) 03/29/2016     Assessment & Plan:   1. Type 2 diabetes mellitus with vascular disease ([place])  His diabetes is  complicated by coronary artery disease status post CABG in 2006 and recent stent placement in 2014. Patient came with near target glucose profile, and  recent A1c of 6.6% .   Glucose logs and insulin administration records pertaining to this visit,  to be scanned into patient's records.    -Recent labs reviewed. - Francis Hall remains at a high risk for more acute and chronic complications of diabetes which include CAD, CVA, CKD, retinopathy, and neuropathy. These are all discussed in detail with the patient.  - I have re-counseled the patient on diet management and weight loss  by adopting a carbohydrate restricted / protein rich  Diet.  - Patient is advised to stick to a routine mealtimes to eat 3 meals  a day and avoid unnecessary snacks ( to snack only to correct hypoglycemia).  Suggestion is made for him to avoid simple carbohydrates  from his diet including Cakes, Sweet Desserts, Ice Cream, Soda (diet and regular), Sweet Tea, Candies, Chips, Cookies, Store Bought Juices, Alcohol in Excess of  1-2 drinks a day, Artificial Sweeteners, and "Sugar-free" Products. This will help patient to have stable blood glucose profile and potentially avoid unintended weight gain.   - I have approached patient with  the following individualized plan to manage diabetes and patient agrees.  - I will continue with basal insulin Lantus 50 units daily at bedtime ,  NovoLog  8 units TIDAC for pre-meal BG readings of 90-149m/dl, plus patient specific correction dose of rapid acting insulin  for unexpected hyperglycemia above 1536mdl, associated with strict monitoring of glucose  AC and HS. - Patient is warned not to take insulin without proper monitoring per orders. -Adjustment parameters are given for hypo and hyperglycemia in writing. -Patient is encouraged to call clinic for blood glucose levels less than 70 or above 300 mg /dl. - I will continue metformin 1000 mg by mouth twice a day . -  I discussed and switched his  Invokana to [place] mg by mouth every morning, to advance as tolerated. . Side effects and precautions discussed with him.  - Patient specific target  for A1c; LDL, HDL, Triglycerides, and  Waist Circumference were discussed in detail.  2) BP/HTN: controlled. Continue current medications including ACEI. 3) Lipids/HPL: Uncontrolled with triglycerides at 589, reportedly improving from 1700. I urged him to be consistent with his Lovaza 2 g by mouth twice a day, fenofibrate 145 mg by mouth daily, and resume Lipitor 20 mg by mouth daily at bedtime.  4)  Weight/Diet: CDE consult in progress, exercise, and carbohydrates information provided.  5) Chronic Care/Health Maintenance:  -Patient  Is on ACEI and Statin medications and encouraged to continue to follow up with Ophthalmology, Podiatrist at least yearly or according to recommendations, and advised to stay away from smoking. I have recommended yearly flu vaccine and pneumonia vaccination at least every 5 years; moderate intensity exercise for up to 150 minutes weekly; and  sleep for at least 7  hours a day.  I advised patient to maintain close follow up with his PCP for primary care needs.  - Time spent with the patient: 25 min, of which >50% was  spent in reviewing his sugar logs , discussing his hypo- and hyper-glycemic episodes, reviewing  previous labs and insulin doses and developing a plan to avoid hypo- and hyper-glycemia.    Patient is asked to bring meter and  blood glucose logs during his  next visit.   Follow up plan: Return in about 3 months (around 04/20/2017) for follow up with pre-visit labs, meter, and logs.  Glade Lloyd, MD Phone: [phone]  Fax: [phone]   01/18/2017, 12:11 PM

## 2017-01-22 ENCOUNTER — Encounter: Payer: Self-pay | Admitting: Cardiovascular Disease

## 2017-01-22 ENCOUNTER — Ambulatory Visit (INDEPENDENT_AMBULATORY_CARE_PROVIDER_SITE_OTHER): Payer: Self-pay | Admitting: Cardiovascular Disease

## 2017-01-22 VITALS — BP 132/68 | HR 64 | Ht 68.0 in | Wt 185.0 lb

## 2017-01-22 DIAGNOSIS — I10 Essential (primary) hypertension: Secondary | ICD-10-CM

## 2017-01-22 DIAGNOSIS — I25708 Atherosclerosis of coronary artery bypass graft(s), unspecified, with other forms of angina pectoris: Secondary | ICD-10-CM

## 2017-01-22 DIAGNOSIS — E785 Hyperlipidemia, unspecified: Secondary | ICD-10-CM

## 2017-01-22 DIAGNOSIS — E1159 Type 2 diabetes mellitus with other circulatory complications: Secondary | ICD-10-CM

## 2017-01-22 LAB — LIPID PANEL
CHOL/HDL RATIO: 4.8 ratio (ref 0.0–5.0)
Cholesterol, Total: 163 mg/dL (ref 100–199)
HDL: 34 mg/dL — ABNORMAL LOW (ref >39)
LDL CALC: 67 mg/dL (ref 0–99)
TRIGLYCERIDES: 309 mg/dL — AB (ref 0–149)
VLDL Cholesterol Cal: 62 mg/dL — ABNORMAL HIGH (ref 5–40)

## 2017-01-22 NOTE — Progress Notes (Signed)
Cardiology Office Note    Date:  01/22/2017   ID:  Hall, Francis [birth year], MRN [id number]  PCP:  Sharilyn Sites, MD  Cardiologist:   Sanda Klein, MD   Chief complaint: CAD   History of Present Illness:  Francis Hall is a 61 y.o. male with coronary artery disease and previous bypass surgery, diastolic heart failure, insulin-requiring diabetes mellitus, hypertension, mixed hyperlipidemia with severe hypertriglyceridemia.  Francis Hall is feeling well and denies any problems with angina, dyspnea, palpitations, syncope or claudication. He has increases activity from 30 minute walks every day to 60 minute walks every day. He has lost 5 more pounds since I last saw him. He is eating a much healthier diet. He credits Dr. Dorris Hall with coaching him through his lifestyle changes.  He had transient problems with leg edema [place], but this has resolved.Francis Hall His weight has been slowly declining and he has lost roughly 7 pounds since I last saw him. Glycemic control remains excellent with a recent hemoglobin A1c of 6.6% . He has not had symptomatic hypoglycemia. His blood pressure is controlled.  His last lipid profile performed in May was unfortunately checked when he had been out of his atorvastatin for a couple of weeks. Both his total cholesterol and his triglycerides were much higher than usual.  Francis Hall had multivessel bypass surgery in 2006 (LIMA to LAD, free radial to circumflex) and in 2014 presented with a small non-STEMI and required percutaneous revascularization (proximal posterolateral artery 2.25 x 16 mm Promus and mid right coronary artery 2.5 x 38 mm Promus and balloon angioplasty of the ostium of the PDA). He is a "Plavix nonresponder", on ticagrelor chronically.  Past Medical History:  Diagnosis Date  . Atrial fibrillation (Francis Hall)   . CAD (coronary artery disease)    [facility]  . CHF (congestive heart failure) (Francis Hall) 11/08/2010   2D Echo EF=>55%  .  Diabetes mellitus    insulin  . Diverticulosis   . Dyspnea   . Family hx of colon cancer   . HTN (hypertension)   . Hyperlipemia   . Hyperplastic colon polyp 07/16/07   30cm   . Myocardial infarction (Francis Hall) 12/2012  . OSA (obstructive sleep apnea) 12/21/2007   sleep study perform REM 73mins REM and NREM was 95.0%, AHI of 12.31hr and RDI of 12.4hr    Past Surgical History:  Procedure Laterality Date  . CARDIAC CATHETERIZATION  2006 2011   2 stents  . CATARACT EXTRACTION W/PHACO Left 12/03/2013   Procedure: CATARACT EXTRACTION PHACO AND INTRAOCULAR LENS PLACEMENT (IOC);  Surgeon: Tonny Branch, MD;  Location: [facility];  Service: Ophthalmology;  Laterality: Left;  CDE:7.07  . CATARACT EXTRACTION W/PHACO Right 12/14/2013   Procedure: CATARACT EXTRACTION PHACO AND INTRAOCULAR LENS PLACEMENT (IOC);  Surgeon: Tonny Branch, MD;  Location: [facility];  Service: Ophthalmology;  Laterality: Right;  CDE 7.84   . COLONOSCOPY  07/2007   Dr Arnoldo Morale: hyperplastic polyp 30cm  . COLONOSCOPY N/A 08/12/2012   Procedure: COLONOSCOPY;  Surgeon: Francis Binder, MD;  Location: [facility];  Service: Endoscopy;  Laterality: N/A;  8:30  . CORONARY ARTERY BYPASS GRAFT  06/2004   x2 performed for 40% left main stenosis, 80% proximal LAD stenosis of the first diagonal artery, and 50% left circumfles artery priximal stenosis.LIMA to LAD and free radial artery to circumflex coronary artery  . LEFT HEART CATHETERIZATION WITH CORONARY/GRAFT ANGIOGRAM  12/29/2012   Procedure: LEFT HEART CATHETERIZATION WITH CORONARY/GRAFT  Francis Hall;  Surgeon: Francis Man, MD;  Location: [facility] CATH LAB;  Service: Cardiovascular;;  . TONSILLECTOMY  1962    Current Medications: Outpatient Medications Prior to Visit  Medication Sig Dispense Refill  . acetaminophen (TYLENOL) 325 MG tablet Take 650 mg by mouth every 6 (six) hours as needed for moderate pain or headache.    Francis Hall aspirin 81 MG EC tablet Take 81 mg by mouth daily.      Francis Hall atorvastatin  (LIPITOR) 20 MG tablet Take 1 tablet (20 mg total) by mouth at bedtime. 90 tablet 2  . BRILINTA 60 MG TABS tablet TAKE 1 TABLET BY MOUTH TWO  TIMES DAILY 180 tablet 1  . CARTIA XT 120 MG 24 hr capsule TAKE 1 CAPSULE BY MOUTH  DAILY 90 capsule 1  . carvedilol (COREG) 12.5 MG tablet Take 1 tablet (12.5 mg total) by mouth 2 (two) times daily with a meal. 60 tablet 4  . Cholecalciferol (VITAMIN D3) 1000 UNITS CAPS Take 2 capsules by mouth daily.    . dapagliflozin propanediol (FARXIGA) 5 MG TABS tablet Take 5 mg by mouth daily. 30 tablet 2  . fenofibrate (TRICOR) 48 MG tablet TAKE 1 TABLET BY MOUTH  DAILY 90 tablet 1  . hydrochlorothiazide (MICROZIDE) 12.5 MG capsule TAKE 1 CAPSULE BY MOUTH AS  NEEDED. 90 capsule 0  . insulin aspart (NOVOLOG FLEXPEN) 100 UNIT/ML FlexPen INJECT 10 TO 16 UNITS  SUBCUTANEOUSLY 3 TIMES  DAILY WITH MEALS 45 mL 0  . Insulin Glargine (LANTUS SOLOSTAR) 100 UNIT/ML Solostar Pen INJECT SUBCUTANEOUSLY 50  UNITS AT BEDTIME 45 mL 0  . lisinopril (PRINIVIL,ZESTRIL) 20 MG tablet TAKE 1 TABLET (20 MG TOTAL) BY MOUTH 2 (TWO) TIMES DAILY. 28 tablet 5  . metFORMIN (GLUCOPHAGE) 1000 MG tablet TAKE 1 TABLET BY MOUTH TWO  TIMES DAILY WITH A MEAL 180 tablet 0  . Multiple Vitamin (MULTIVITAMIN) capsule Take 1 capsule by mouth daily.      Francis Hall NITROSTAT 0.4 MG SL tablet PLACE 1 TABLET UNDER TONGUE IF NEEDED FOR CHEST PAIN.Francis Hall OF 3 DOSES 25 tablet 3  . omega-3 acid ethyl esters (LOVAZA) 1 g capsule TAKE 2 CAPSULES BY MOUTH  TWICE DAILY 360 capsule 3  . vitamin C (ASCORBIC ACID) 500 MG tablet Take 500 mg by mouth daily.     No facility-administered medications prior to visit.      Allergies:   Crestor [rosuvastatin]   Social History   Social History  . Marital status: Married    Spouse name: N/A  . Number of children: 2  . Years of education: 12   Occupational History  . disabled Dealer     Social History Main Topics  . Smoking status: Never Smoker  . Smokeless tobacco: Never  Used  . Alcohol use Yes     Comment: social-2 beers/wk  . Drug use: No  . Sexual activity: Yes    Birth control/ protection: None   Other Topics Concern  . None   Social History Narrative  . None     Family History:  The patient's family history includes Arthritis in his unknown relative; Colon cancer in his father; Coronary artery disease (age of onset: 72) in his father; Diabetes in his unknown relative; Heart disease in his unknown relative; Lung cancer in his father.   ROS:   Please see the history of present illness.    ROS All other systems reviewed and are negative.   PHYSICAL EXAM:   VS:  BP 132/68  Pulse 64   Ht 5\' 8"  (1.727 m)   Wt 185 lb (83.9 kg)   BMI 28.13 kg/m     General: Alert, oriented x3, Well nourished, well developed, in no acute distress . Mildly overweight Head: no evidence of trauma, PERRL, EOMI, no exophtalmos or lid lag, no myxedema, no xanthelasma; normal ears, nose and oropharynx Neck: normal jugular venous pulsations and no hepatojugular reflux; brisk carotid pulses without delay and no carotid bruits Chest: clear to auscultation, no signs of consolidation by percussion or palpation, normal fremitus, symmetrical and full respiratory excursions Cardiovascular: normal position and quality of the apical impulse, regular rhythm, normal first and second heart sounds, no murmurs, rubs or gallops Abdomen: no tenderness or distention, no masses by palpation, no abnormal pulsatility or arterial bruits, normal bowel sounds, no hepatosplenomegaly Extremities: no clubbing, cyanosis or edema; 2+ radial, ulnar and brachial pulses bilaterally; 2+ right femoral, posterior tibial and dorsalis pedis pulses; 2+ left femoral, posterior tibial and dorsalis pedis pulses; no subclavian or femoral bruits Neurological: grossly nonfocal Psych: euthymic mood, full affect  Wt Readings from Last 3 Encounters:  01/22/17 185 lb (83.9 kg)  01/18/17 189 lb (85.7 kg)  10/17/16  189 lb (85.7 kg)      Studies/Labs Reviewed:   EKG:  EKG is ordered today.  The ekg ordered today Shows sinus rhythm with atrial bigeminy, otherwise normal tracing  Recent Labs: 01/11/2017: ALT 26; BUN 24; Creatinine, Ser 1.13; Potassium 4.3; Sodium 139; TSH 2.460   Lipid Panel    Component Value Date/Time   CHOL 269 (H) 10/10/2016 0913   TRIG 589 (HH) 10/10/2016 0913   HDL 29 (L) 10/10/2016 0913   CHOLHDL 9.3 (H) 10/10/2016 0913   CHOLHDL 7.0 12/29/2012 0438   VLDL UNABLE TO CALCULATE IF TRIGLYCERIDE OVER 400 mg/dL 12/29/2012 0438   LDLCALC Comment 10/10/2016 0913    Additional studies/ records that were reviewed today include:  Notes from Dr. Dorris Hall  ASSESSMENT:    1. Coronary artery disease involving coronary bypass graft of native heart with other forms of angina pectoris ([place])   2. Essential hypertension   3. Type 2 diabetes mellitus with vascular disease ([place])   4. Dyslipidemia      PLAN:  In order of problems listed above:  1. CAD s/p CABG 2006, PCI-RCA 2014: He is asymptomatic despite an increasingly active lifestyle. He was a Plavix nonresponder and is on maintenance dose of Brilinta. 2. HTN: Good control even after reducing the beta blocker dose 3. HLP: Last set of labs was drawn when he was not taking his lipid-lowering medication. Will recheck today. Glycemic control is good. Target LDL under 70, triglycerides preferably under 150 although this may not be achievable. Might still be a candidate for PCSK9 inhibitors. 4. DM: With Dr. Liliane Channel excellent advice and lifestyle coaching he now has achieved excellent control. He is no longer obese, although he remains moderately overweight, he is steadily losing weight. Would set a target waistline of about 32 inches (currently at approximately 35 inches), ideally BMI under 25.    Medication Adjustments/Labs and Tests Ordered: Current medicines are reviewed at length with the patient today.  Concerns regarding medicines  are outlined above.  Medication changes, Labs and Tests ordered today are listed in the Patient Instructions below. Patient Instructions  Dr Sallyanne Kuster recommends that you continue on your current medications as directed. Please refer to the Current Medication list given to you today.  Your physician recommends that you return for lab  work at your earliest [place].  Dr Sallyanne Kuster recommends that you schedule a follow-up appointment in 12 months. You will receive a reminder letter in the mail two months in advance. If you don't receive a letter, please call our office to schedule the follow-up appointment.  If you need a refill on your cardiac medications before your next appointment, please call your pharmacy.    Signed, Sanda Klein, MD  01/22/2017 11:54 AM    [facility] [address], [place], [place]  [address] Phone: [phone]; Fax: [phone]

## 2017-01-22 NOTE — Patient Instructions (Signed)
Dr Croitoru recommends that you continue on your current medications as directed. Please refer to the Current Medication list given to you today.  Your physician recommends that you return for lab work at your earliest convenience - FASTING.  Dr Croitoru recommends that you schedule a follow-up appointment in 12 months. You will receive a reminder letter in the mail two months in advance. If you don't receive a letter, please call our office to schedule the follow-up appointment.  If you need a refill on your cardiac medications before your next appointment, please call your pharmacy. 

## 2017-01-31 ENCOUNTER — Other Ambulatory Visit: Payer: Self-pay | Admitting: Cardiovascular Disease

## 2017-02-27 ENCOUNTER — Other Ambulatory Visit: Payer: Self-pay

## 2017-03-04 ENCOUNTER — Other Ambulatory Visit: Payer: Self-pay

## 2017-03-04 ENCOUNTER — Telehealth: Payer: Self-pay | Admitting: Cardiovascular Disease

## 2017-03-04 MED ORDER — INSULIN ASPART 100 UNIT/ML FLEXPEN: PEN_INJECTOR | SUBCUTANEOUS | 2 refills | Status: DC

## 2017-03-04 MED ORDER — INSULIN GLARGINE 100 UNIT/ML SOLOSTAR PEN: PEN_INJECTOR | SUBCUTANEOUS | 2 refills | Status: DC

## 2017-03-04 NOTE — Telephone Encounter (Signed)
Verified with pharmacist Shanon Brow HCTZ 1 daily prn

## 2017-03-04 NOTE — Telephone Encounter (Signed)
NEw Message  Pt c/o medication issue:  1. Name of Medication: Hydrochlorothiazide   2. How are you currently taking this medication (dosage and times per day)?12.5mg    3. Are you having a reaction (difficulty breathing--STAT)?no   4. What is your medication issue? Amor from [employer] call to get medication verification for pts current frequency. Please call back to discuss

## 2017-04-30 ENCOUNTER — Ambulatory Visit: Payer: BLUE CROSS/BLUE SHIELD | Admitting: "Endocrinology

## 2017-06-10 ENCOUNTER — Telehealth: Payer: Self-pay | Admitting: "Endocrinology

## 2017-06-10 ENCOUNTER — Other Ambulatory Visit: Payer: Self-pay

## 2017-06-10 ENCOUNTER — Other Ambulatory Visit: Payer: Self-pay | Admitting: Cardiovascular Disease

## 2017-06-10 MED ORDER — INSULIN ASPART 100 UNIT/ML FLEXPEN: PEN_INJECTOR | SUBCUTANEOUS | 2 refills | Status: DC

## 2017-06-10 MED ORDER — METFORMIN HCL 1000 MG PO TABS: ORAL_TABLET | ORAL | 2 refills | Status: DC

## 2017-06-10 MED ORDER — LISINOPRIL 20 MG PO TABS: ORAL_TABLET | ORAL | 2 refills | Status: DC

## 2017-06-10 MED ORDER — FENOFIBRATE 48 MG PO TABS: 48.0000 mg | ORAL_TABLET | Freq: Every day | ORAL | 2 refills | Status: DC

## 2017-06-10 MED ORDER — DAPAGLIFLOZIN PROPANEDIOL 5 MG PO TABS: 5.0000 mg | ORAL_TABLET | Freq: Every day | ORAL | 2 refills | Status: DC

## 2017-06-10 MED ORDER — INSULIN GLARGINE 100 UNIT/ML SOLOSTAR PEN: PEN_INJECTOR | SUBCUTANEOUS | 2 refills | Status: DC

## 2017-06-10 MED ORDER — GLUCOSE BLOOD VI STRP: ORAL_STRIP | 5 refills | Status: DC

## 2017-06-10 NOTE — Telephone Encounter (Signed)
Francis Hall is needing  A new Rx for a contour next meter and test strips due to the insurance not paying for old meters test strips, please advise?

## 2017-06-10 NOTE — Telephone Encounter (Signed)
Rx(s) sent to pharmacy electronically.  

## 2017-06-10 NOTE — Telephone Encounter (Signed)
°*  STAT* If patient is at the pharmacy, call can be transferred to refill team.   1. Which medications need to be refilled? (please list name of each medication and dose if known) Fenofibrate 48 mg ( Needs a new Prescription sent in   2. Which pharmacy/location (including street and city if local pharmacy) is medication to be sent to?[facility] in [place]   3. Do they need a 30 day or 90 day supply? [place]

## 2017-06-10 NOTE — Telephone Encounter (Signed)
Pt's wife calling requesting a refill on fenofibrate, sent to [facility] in [place]. Please address

## 2017-06-11 MED ORDER — GLUCOSE BLOOD VI STRP: ORAL_STRIP | 5 refills | Status: DC

## 2017-06-11 MED ORDER — CONTOUR NEXT MONITOR W/DEVICE KIT: 1.0000 | PACK | Freq: Four times a day (QID) | 0 refills | Status: DC

## 2017-06-18 ENCOUNTER — Other Ambulatory Visit: Payer: Self-pay | Admitting: "Endocrinology

## 2017-06-19 LAB — RENAL FUNCTION PANEL
Albumin: 4.3 g/dL (ref 3.6–4.8)
BUN / CREAT RATIO: 13 (ref 10–24)
BUN: 11 mg/dL (ref 8–27)
CALCIUM: 9.3 mg/dL (ref 8.6–10.2)
CO2: 24 mmol/L (ref 20–29)
CREATININE: 0.83 mg/dL (ref 0.76–1.27)
Chloride: 100 mmol/L (ref 96–106)
GFR calc Af Amer: 111 mL/min/{1.73_m2} (ref >59)
GFR, EST NON AFRICAN AMERICAN: 96 mL/min/{1.73_m2} (ref >59)
Glucose: 110 mg/dL — ABNORMAL HIGH (ref 65–99)
Phosphorus: 3.2 mg/dL (ref 2.5–4.5)
Potassium: 4.1 mmol/L (ref 3.5–5.2)
SODIUM: 140 mmol/L (ref 134–144)

## 2017-06-19 LAB — HGB A1C W/O EAG: Hgb A1c MFr Bld: 8.1 % — ABNORMAL HIGH (ref 4.8–5.6)

## 2017-06-27 ENCOUNTER — Encounter: Payer: Self-pay | Admitting: "Endocrinology

## 2017-06-27 ENCOUNTER — Ambulatory Visit: Payer: BLUE CROSS/BLUE SHIELD | Admitting: "Endocrinology

## 2017-06-27 VITALS — BP 149/68 | HR 63 | Ht 68.0 in | Wt 202.0 lb

## 2017-06-27 DIAGNOSIS — E782 Mixed hyperlipidemia: Secondary | ICD-10-CM

## 2017-06-27 DIAGNOSIS — I10 Essential (primary) hypertension: Secondary | ICD-10-CM | POA: Diagnosis not present

## 2017-06-27 DIAGNOSIS — E1159 Type 2 diabetes mellitus with other circulatory complications: Secondary | ICD-10-CM | POA: Diagnosis not present

## 2017-06-27 MED ORDER — FREESTYLE LIBRE READER DEVI: 1.0000 | Freq: Once | 0 refills | Status: AC

## 2017-06-27 MED ORDER — FREESTYLE LIBRE SENSOR SYSTEM MISC: 2 refills | Status: DC

## 2017-06-27 NOTE — Progress Notes (Signed)
Subjective:    Patient ID: Francis Hall, male    DOB: 1956-01-26,    Past Medical History:  Diagnosis Date  . Atrial fibrillation ([place])   . CAD (coronary artery disease)    [place]  . CHF (congestive heart failure) ([place]) 11/08/2010   2D Echo EF=>55%  . Diabetes mellitus    insulin  . Diverticulosis   . Dyspnea   . Family hx of colon cancer   . HTN (hypertension)   . Hyperlipemia   . Hyperplastic colon polyp 07/16/07   30cm   . Myocardial infarction ([place]) 12/2012  . OSA (obstructive sleep apnea) 12/21/2007   sleep study perform REM 106mns REM and NREM was 95.0%, AHI of 12.31hr and RDI of 12.4hr   Past Surgical History:  Procedure Laterality Date  . CARDIAC CATHETERIZATION  2006 2011   2 stents  . CATARACT EXTRACTION W/PHACO Left 12/03/2013   Procedure: CATARACT EXTRACTION PHACO AND INTRAOCULAR LENS PLACEMENT (IOC);  Surgeon: KTonny Branch MD;  Location: [facility];  Service: Ophthalmology;  Laterality: Left;  CDE:7.07  . CATARACT EXTRACTION W/PHACO Right 12/14/2013   Procedure: CATARACT EXTRACTION PHACO AND INTRAOCULAR LENS PLACEMENT (IOC);  Surgeon: KTonny Branch MD;  Location: [facility];  Service: Ophthalmology;  Laterality: Right;  CDE 7.84   . COLONOSCOPY  07/2007   Dr JArnoldo Morale hyperplastic polyp 30cm  . COLONOSCOPY N/A 08/12/2012   Procedure: COLONOSCOPY;  Surgeon: SDanie Binder MD;  Location: [facility];  Service: Endoscopy;  Laterality: N/A;  8:30  . CORONARY ARTERY BYPASS GRAFT  06/2004   x2 performed for 40% left main stenosis, 80% proximal LAD stenosis of the first diagonal artery, and 50% left circumfles artery priximal stenosis.LIMA to LAD and free radial artery to circumflex coronary artery  . LEFT HEART CATHETERIZATION WITH CORONARY/GRAFT ANGIOGRAM  12/29/2012   Procedure: LEFT HEART CATHETERIZATION WITH CBeatrix Fetters  Surgeon: DLeonie Man MD;  [facility];  Service: Cardiovascular;;  . TONSILLECTOMY  1962   Social History    Socioeconomic History  . Marital status: Married    Spouse name: None  . Number of children: 2  . Years of education: 160 . Highest education level: None  Social Needs  . Financial resource strain: None  . Food insecurity - worry: None  . Food insecurity - inability: None  . Transportation needs - medical: None  . Transportation needs - non-medical: None  Occupational History  . Occupation: disabled mDealer  Tobacco Use  . Smoking status: Never Smoker  . Smokeless tobacco: Never Used  Substance and Sexual Activity  . Alcohol use: Yes    Comment: social-2 beers/wk  . Drug use: No  . Sexual activity: Yes    Birth control/protection: None  Other Topics Concern  . None  Social History Narrative  . None   Outpatient Encounter Medications as of 06/27/2017  Medication Sig  . acetaminophen (TYLENOL) 325 MG tablet Take 650 mg by mouth every 6 (six) hours as needed for moderate pain or headache.  .Marland Kitchenaspirin 81 MG EC tablet Take 81 mg by mouth daily.    .Marland Kitchenatorvastatin (LIPITOR) 20 MG tablet Take 1 tablet (20 mg total) by mouth at bedtime.  . Blood Glucose Monitoring Suppl (CONTOUR NEXT MONITOR) w/Device KIT 1 each by Does not apply route 4 (four) times daily.  .Marland KitchenBRILINTA 60 MG TABS tablet TAKE 1 TABLET BY MOUTH TWO  TIMES DAILY  . carvedilol (COREG) 12.5 MG tablet Take  1 tablet (12.5 mg total) by mouth 2 (two) times daily with a meal.  . Cholecalciferol (VITAMIN D3) 1000 UNITS CAPS Take 2 capsules by mouth daily.  . Continuous Blood Gluc Receiver (FREESTYLE LIBRE READER) DEVI 1 Piece by Does not apply route once for 1 dose.  . Continuous Blood Gluc Sensor (FREESTYLE LIBRE SENSOR SYSTEM) MISC Use one sensor every 10 days.  [name] diltiazem (CARDIZEM CD) 120 MG 24 hr capsule TAKE 1 CAPSULE BY MOUTH  DAILY  . fenofibrate (TRICOR) 48 MG tablet Take 1 tablet (48 mg total) by mouth daily.  [name] glucose blood (CONTOUR NEXT TEST) test strip Use as instructed 4 x daily. E11.65  .  hydrochlorothiazide (MICROZIDE) 12.5 MG capsule TAKE 1 CAPSULE BY MOUTH AS  NEEDED.  [name] insulin aspart (NOVOLOG FLEXPEN) 100 UNIT/ML FlexPen INJECT 8 TO 14 UNITS  SUBCUTANEOUSLY 3 TIMES  DAILY WITH MEALS  . Insulin Glargine (LANTUS SOLOSTAR) 100 UNIT/ML Solostar Pen INJECT SUBCUTANEOUSLY 50  UNITS AT BEDTIME  . lisinopril (PRINIVIL,ZESTRIL) 20 MG tablet TAKE 1 TABLET (20 MG TOTAL) BY MOUTH 2 (TWO) TIMES DAILY.  . metFORMIN (GLUCOPHAGE) 1000 MG tablet TAKE 1 TABLET BY MOUTH TWO  TIMES DAILY WITH A MEAL  . Multiple Vitamin (MULTIVITAMIN) capsule Take 1 capsule by mouth daily.    [name] NITROSTAT 0.4 MG SL tablet PLACE 1 TABLET UNDER TONGUE IF NEEDED FOR CHEST PAIN..MAX OF 3 DOSES  . omega-3 acid ethyl esters (LOVAZA) 1 g capsule TAKE 2 CAPSULES BY MOUTH  TWICE DAILY  . vitamin C (ASCORBIC ACID) 500 MG tablet Take 500 mg by mouth daily.  . [DISCONTINUED] dapagliflozin propanediol (FARXIGA) 5 MG TABS tablet Take 5 mg by mouth daily. (Patient not taking: Reported on 06/27/2017)  . [DISCONTINUED] glucose blood test strip Use as instructed 4 x daily. E11.65   One touch ultra   No facility-administered encounter medications on file as of 06/27/2017.    ALLERGIES: Allergies  Allergen Reactions  . Crestor [Rosuvastatin] Other (See Comments)    myalgia   VACCINATION STATUS:  There is no immunization history on file for this patient.  Diabetes  He presents for his follow-up diabetic visit. He has type 2 diabetes mellitus. Onset time: He was diagnosed at approximate age of 30 years. His disease course has been worsening. There are no hypoglycemic associated symptoms. Pertinent negatives for hypoglycemia include no confusion, headaches, pallor or seizures. There are no diabetic associated symptoms. Pertinent negatives for diabetes include no chest pain, no fatigue, no polydipsia, no polyphagia, no polyuria and no weakness. There are no hypoglycemic complications. Symptoms are worsening. Diabetic complications  include heart disease and peripheral neuropathy. Risk factors for coronary artery disease include dyslipidemia, diabetes mellitus, hypertension and male sex. Current diabetic treatment includes intensive insulin program. His weight is increasing steadily. He is following a generally unhealthy diet. He has had a previous visit with a dietitian. He participates in exercise intermittently. His home blood glucose trend is increasing steadily (He still has fasting hyperglycemia driven mainly by heavy carbohydrate ingestion at supper, and evening snacks.). His breakfast blood glucose range is generally 180-200 mg/dl. His lunch blood glucose range is generally 180-200 mg/dl. His dinner blood glucose range is generally 180-200 mg/dl. His bedtime blood glucose range is generally 180-200 mg/dl. His overall blood glucose range is 180-200 mg/dl. An ACE inhibitor/angiotensin II receptor blocker is being taken. Eye exam is current.  Hypertension  This is a chronic problem. The current episode started more than 1 year ago. The problem  has been waxing and waning since onset. Pertinent negatives include no chest pain, headaches, neck pain, palpitations or shortness of breath. Risk factors for coronary artery disease include diabetes mellitus, dyslipidemia, male gender and sedentary lifestyle. Past treatments include ACE inhibitors and beta blockers. Hypertensive end-organ damage includes CAD/MI.  Hyperlipidemia  This is a chronic problem. The current episode started more than 1 year ago. Recent lipid tests were reviewed and are high. Exacerbating diseases include diabetes and obesity. Pertinent negatives include no chest pain, myalgias or shortness of breath. Current antihyperlipidemic treatment includes statins. Risk factors for coronary artery disease include diabetes mellitus, dyslipidemia, hypertension, male sex, a sedentary lifestyle, obesity and family history.     Review of Systems  Constitutional: Negative for  fatigue and unexpected weight change.  HENT: Negative for dental problem, mouth sores and trouble swallowing.   Eyes: Negative for visual disturbance.  Respiratory: Negative for cough, choking, chest tightness, shortness of breath and wheezing.   Cardiovascular: Negative for chest pain, palpitations and leg swelling.  Gastrointestinal: Negative for abdominal distention, abdominal pain, constipation, diarrhea, nausea and vomiting.  Endocrine: Negative for polydipsia, polyphagia and polyuria.  Genitourinary: Negative for dysuria, flank pain, hematuria and urgency.  Musculoskeletal: Negative for back pain, gait problem, myalgias and neck pain.  Skin: Negative for pallor, rash and wound.  Neurological: Negative for seizures, syncope, weakness, numbness and headaches.  Psychiatric/Behavioral: Negative.  Negative for confusion and dysphoric mood.    Objective:    BP (!) 149/68   Pulse 63   Ht _0  (1.727 m)   Wt 202 lb (91.6 kg)   BMI 30.71 kg/m   Wt Readings from Last 3 Encounters:  06/27/17 202 lb (91.6 kg)  01/22/17 185 lb (83.9 kg)  01/18/17 189 lb (85.7 kg)    Physical Exam  Constitutional: He is oriented to person, place, and time. He appears well-developed and well-nourished. He is cooperative. No distress.  HENT:  Head: Normocephalic and atraumatic.  Eyes: EOM are normal.  Neck: Normal range of motion. Neck supple. No tracheal deviation present. No thyromegaly present.  Cardiovascular: Normal rate, S1 normal, S2 normal and normal heart sounds. Exam reveals no gallop.  No murmur heard. Pulses:      Dorsalis pedis pulses are 1+ on the right side, and 1+ on the left side.       Posterior tibial pulses are 1+ on the right side, and 1+ on the left side.  Pulmonary/Chest: Breath sounds normal. No respiratory distress. He has no wheezes.  Abdominal: Soft. Bowel sounds are normal. He exhibits no distension. There is no tenderness. There is no guarding and no CVA tenderness.   Musculoskeletal: He exhibits no edema.       Right shoulder: He exhibits no swelling and no deformity.  Neurological: He is alert and oriented to person, place, and time. He has normal strength and normal reflexes. No cranial nerve deficit or sensory deficit. Gait normal.  Skin: Skin is warm and dry. No rash noted. No cyanosis. Nails show no clubbing.  Psychiatric: He has a normal mood and affect. His speech is normal and behavior is normal. Judgment and thought content normal. Cognition and memory are normal.    Results for orders placed or performed in visit on 06/18/17  Renal function panel  Result Value Ref Range   Glucose 110 (H) 65 - 99 mg/dL   BUN 11 8 - 27 mg/dL   Creatinine, Ser 0.83 0.76 - 1.27 mg/dL   GFR calc non  Af Amer 96 >59 mL/min/1.73   GFR calc Af Amer 111 >59 mL/min/1.73   BUN/Creatinine Ratio 13 10 - 24   Sodium 140 134 - 144 mmol/L   Potassium 4.1 3.5 - 5.2 mmol/L   Chloride 100 96 - 106 mmol/L   CO2 24 20 - 29 mmol/L   Calcium 9.3 8.6 - 10.2 mg/dL   Phosphorus 3.2 2.5 - 4.5 mg/dL   Albumin 4.3 3.6 - 4.8 g/dL  Hgb A1c w/o eAG  Result Value Ref Range   Hgb A1c MFr Bld 8.1 (H) 4.8 - 5.6 %   Diabetic Labs (most recent): Lab Results  Component Value Date   HGBA1C 8.1 (H) 06/18/2017   HGBA1C 6.6 (H) 10/10/2016   HGBA1C 7.0 (H) 07/04/2016     Assessment & Plan:   1. Type 2 diabetes mellitus with vascular disease ([place])  His diabetes is  complicated by coronary artery disease status post CABG in 2006 and recent stent placement in 2014. Patient came with above target blood glucose profile, A1c increased to 8.1% from 6.6%. - He admits to significant dietary indiscretion during the holiday season.   Glucose logs and insulin administration records pertaining to this visit,  to be scanned into patient's records.    -Recent labs reviewed. - [name] remains at a high risk for more acute and chronic complications of diabetes which include CAD, CVA, CKD,  retinopathy, and neuropathy. These are all discussed in detail with the patient.  - I have re-counseled the patient on diet management and weight loss  by adopting a carbohydrate restricted / protein rich  Diet.  - Patient is advised to stick to a routine mealtimes to eat 3 meals  a day and avoid unnecessary snacks ( to snack only to correct hypoglycemia). -  Suggestion is made for him to avoid simple carbohydrates  from his diet including Cakes, Sweet Desserts / Pastries, Ice Cream, Soda (diet and regular), Sweet Tea, Candies, Chips, Cookies, Store Bought Juices, Alcohol in Excess of  1-2 drinks a day, Artificial Sweeteners, and "Sugar-free" Products. This will help patient to have stable blood glucose profile and potentially avoid unintended weight gain.   - I have approached patient with the following individualized plan to manage diabetes and patient agrees.  -  He'll continue to require basal/bolus insulin to treat diabetes to target.  - I will continue with basal insulin Lantus 50 units daily at bedtime , increase NovoLog  To 10 units 3 times a day before meals  for pre-meal BG readings of 90-156m/dl, plus patient specific correction dose of rapid acting insulin  for unexpected hyperglycemia above 1536mdl, associated with strict monitoring of glucose  AC and HS. - Patient is warned not to take insulin without proper monitoring per orders. -Adjustment parameters are given for hypo and hyperglycemia in writing. -Patient is encouraged to call clinic for blood glucose levels less than 70 or above 300 mg /dl. - He'll benefit from continued glucose monitoring. I discussed and [facility]. - I will continue metformin 1000 mg by mouth twice a day . -  I will discontinue  Fa[place]- Patient specific target  for A1c; LDL, HDL, Triglycerides, and  Waist Circumference were discussed in detail.  2) BP/HTN:   his blood pressure is not controlled to  target. Continue current medications including ACEI, beta blockers   3) Lipids/HPL: Uncontrolled with triglycerides at   309, aggressively  improving from 1700. I urged  him to be consistent with his Lovaza 2 g by mouth twice a day, fenofibrate 145 mg by mouth daily, and  Lipitor 20 mg by mouth daily at bedtime.  4)  Weight/Diet: CDE consult in progress, exercise, and carbohydrates information provided.  5) Chronic Care/Health Maintenance:  -Patient  Is on ACEI and Statin medications and encouraged to continue to follow up with Ophthalmology, Podiatrist at least yearly or according to recommendations, and advised to stay away from smoking. I have recommended yearly flu vaccine and pneumonia vaccination at least every 5 years; moderate intensity exercise for up to 150 minutes weekly; and  sleep for at least 7 hours a day.  I advised patient to maintain close follow up with his PCP for primary care needs.  - Time spent with the patient: 25 min, of which >50% was spent in reviewing his blood glucose logs , discussing his hypo- and hyper-glycemic episodes, reviewing his current and  previous labs and insulin doses and developing a plan to avoid hypo- and hyper-glycemia. Please refer to Patient Instructions for Blood Glucose Monitoring and Insulin/Medications Dosing Guide"  in media tab for additional information.   Patient is asked to bring meter and  blood glucose logs during his  next visit.   Follow up plan: Return in about 3 months (around 09/25/2017) for follow up with pre-visit labs, meter, and logs.  Glade Lloyd, MD Phone: [phone]  Fax: [phone]  -  This note was partially dictated with voice recognition software. Similar sounding words can be transcribed inadequately or may not  be corrected upon review.  06/27/2017, 4:44 PM

## 2017-06-27 NOTE — Patient Instructions (Signed)

## 2017-07-10 ENCOUNTER — Other Ambulatory Visit: Payer: Self-pay | Admitting: "Endocrinology

## 2017-08-12 ENCOUNTER — Telehealth: Payer: Self-pay | Admitting: Cardiovascular Disease

## 2017-08-12 NOTE — Telephone Encounter (Signed)
New message  Pt c/o Shortness Of Breath: STAT if SOB developed within the last 24 hours or pt is noticeably SOB on the phone  1. Are you currently SOB (can you hear that pt is SOB on the phone)? Wife on phone  2. How long have you been experiencing SOB? Few months  3. Are you SOB when sitting or when up moving around? After he eats  4. Are you currently experiencing any other symptoms? indigestion

## 2017-08-12 NOTE — Telephone Encounter (Signed)
Spoke to patient. Patient saw primary today . Informed PCP of him having indigestion and s.o.b after eating. Dr Raymon Mutton patient prescription for indigestion  Recommend to take for about month ans=d follow up with cardiologist.  Appointment given with extender.09/10/17 . - enough time to see medication is working. Patient verbalized understanding

## 2017-08-26 ENCOUNTER — Telehealth: Payer: Self-pay | Admitting: Cardiovascular Disease

## 2017-08-26 MED ORDER — DILTIAZEM HCL ER COATED BEADS 120 MG PO CP24: 120.0000 mg | ORAL_CAPSULE | Freq: Every day | ORAL | 1 refills | Status: DC

## 2017-08-26 NOTE — Telephone Encounter (Signed)
New message     *STAT* If patient is at the pharmacy, call can be transferred to refill team.   1. Which medications need to be refilled? (please list name of each medication and dose if known)   diltiazem (CARDIZEM CD) 120 MG 24 hr capsule TAKE 1 CAPSULE BY MOUTH DAILY        2. Which pharmacy/location (including street and city if local pharmacy) is medication to be sent to? [facility]  [place]   3. Do they need a 30 day or 90 day supply?  [place]

## 2017-09-08 ENCOUNTER — Other Ambulatory Visit: Payer: Self-pay | Admitting: "Endocrinology

## 2017-09-10 ENCOUNTER — Ambulatory Visit: Payer: BLUE CROSS/BLUE SHIELD | Admitting: Cardiology

## 2017-09-10 ENCOUNTER — Encounter: Payer: Self-pay | Admitting: Cardiology

## 2017-09-10 VITALS — BP 134/60 | HR 72 | Ht 68.0 in | Wt 201.0 lb

## 2017-09-10 DIAGNOSIS — R079 Chest pain, unspecified: Secondary | ICD-10-CM

## 2017-09-10 DIAGNOSIS — I10 Essential (primary) hypertension: Secondary | ICD-10-CM | POA: Diagnosis not present

## 2017-09-10 DIAGNOSIS — Z951 Presence of aortocoronary bypass graft: Secondary | ICD-10-CM

## 2017-09-10 DIAGNOSIS — E118 Type 2 diabetes mellitus with unspecified complications: Secondary | ICD-10-CM

## 2017-09-10 DIAGNOSIS — E782 Mixed hyperlipidemia: Secondary | ICD-10-CM | POA: Diagnosis not present

## 2017-09-10 DIAGNOSIS — I2510 Atherosclerotic heart disease of native coronary artery without angina pectoris: Secondary | ICD-10-CM

## 2017-09-10 DIAGNOSIS — Z9861 Coronary angioplasty status: Secondary | ICD-10-CM

## 2017-09-10 DIAGNOSIS — Z794 Long term (current) use of insulin: Secondary | ICD-10-CM

## 2017-09-10 MED ORDER — NITROGLYCERIN 0.4 MG SL SUBL: SUBLINGUAL_TABLET | SUBLINGUAL | 5 refills | Status: DC

## 2017-09-10 NOTE — Assessment & Plan Note (Signed)
s/p PCI on long mid RCA lesionsand distal RCA-RPLA lesion with PTCA / DES 2014

## 2017-09-10 NOTE — Assessment & Plan Note (Signed)
controlled 

## 2017-09-10 NOTE — Progress Notes (Signed)
Thanks MCr 

## 2017-09-10 NOTE — Assessment & Plan Note (Signed)
CABG 2006, LIMA to LAD, free left radial artery to circumflex coronary artery.

## 2017-09-10 NOTE — Patient Instructions (Addendum)
medication instruction   refilled  Nitroglycerin  Continue all other medications     Test schedule at [facility] has requested that you have en exercise stress myoview. For further information please visit [email or web address]. Please follow instruction sheet, as given. IF ABNORMAL WILL MAKE AN APPOINTMENT SOONER.   Your physician wants you to follow-up in [place].  You will receive a reminder letter in the mail two months in advance. If you don't receive a letter, please call our office to schedule the follow-up appointment.

## 2017-09-10 NOTE — Progress Notes (Signed)
09/10/2017 Francis Hall   Apr 10, 1956  [id number]  Primary Physician Sharilyn Sites, MD Primary Cardiologist: Dr Sallyanne Kuster  HPI:  62 y/o IDDM male, has [place] in [place], with  History of CAD. He is s/p CABG in 2006 and a PCI in 2014. He has done well since. He is in the office today for evaluation. He had mentioned to his PCP that he was having some "indigestion" and SOB after eating for the past 2-3 months. A PPI was added and he was encouraged to f/u with his cardiologist. He denies any rest pain. He has not been very active. He had [place] but he admits he has not done that this winter. He has also put on weight as well and his HGb A1c is now up to 8. He denies any jaw pain or arm pain.    Current Outpatient Medications  Medication Sig Dispense Refill  . acetaminophen (TYLENOL) 325 MG tablet Take 650 mg by mouth every 6 (six) hours as needed for moderate pain or headache.    [name] aspirin 81 MG EC tablet Take 81 mg by mouth daily.      [name] atorvastatin (LIPITOR) 20 MG tablet TAKE 1 TABLET BY MOUTH AT BEDTIME 30 tablet 2  . Blood Glucose Monitoring Suppl (CONTOUR NEXT MONITOR) w/Device KIT 1 each by Does not apply route 4 (four) times daily. 1 kit 0  . BRILINTA 60 MG TABS tablet TAKE 1 TABLET BY MOUTH TWO  TIMES DAILY 180 tablet 1  . carvedilol (COREG) 12.5 MG tablet Take 1 tablet (12.5 mg total) by mouth 2 (two) times daily with a meal. 60 tablet 4  . Cholecalciferol (VITAMIN D3) 1000 UNITS CAPS Take 2 capsules by mouth daily.    . Continuous Blood Gluc Sensor (FREESTYLE LIBRE SENSOR SYSTEM) MISC Use one sensor every 10 days. 3 each 2  . diltiazem (CARDIZEM CD) 120 MG 24 hr capsule Take 1 capsule (120 mg total) by mouth daily. 90 capsule 1  . glucose blood (CONTOUR NEXT TEST) test strip Use as instructed 4 x daily. E11.65 150 each 5  . hydrochlorothiazide (MICROZIDE) 12.5 MG capsule TAKE 1 CAPSULE BY MOUTH AS  NEEDED. 90 capsule 0  . insulin aspart  (NOVOLOG FLEXPEN) 100 UNIT/ML FlexPen INJECT 8 TO 14 UNITS  SUBCUTANEOUSLY 3 TIMES  DAILY WITH MEALS 15 mL 2  . Insulin Glargine (LANTUS SOLOSTAR) 100 UNIT/ML Solostar Pen INJECT SUBCUTANEOUSLY 50  UNITS AT BEDTIME 15 mL 2  . lisinopril (PRINIVIL,ZESTRIL) 20 MG tablet TAKE 1 TABLET (20 MG TOTAL) BY MOUTH 2 (TWO) TIMES DAILY. 30 tablet 2  . metFORMIN (GLUCOPHAGE) 1000 MG tablet TAKE 1 TABLET BY MOUTH TWO TIMES DAILY WITH A MEAL 60 tablet 2  . Multiple Vitamin (MULTIVITAMIN) capsule Take 1 capsule by mouth daily.      [name] NITROSTAT 0.4 MG SL tablet PLACE 1 TABLET UNDER TONGUE IF NEEDED FOR CHEST PAIN.[name] OF 3 DOSES 25 tablet 3  . vitamin C (ASCORBIC ACID) 500 MG tablet Take 500 mg by mouth daily.     No current facility-administered medications for this visit.     Allergies  Allergen Reactions  . Crestor [Rosuvastatin] Other (See Comments)    myalgia    Past Medical History:  Diagnosis Date  . Atrial fibrillation ([place])   . CAD (coronary artery disease)    [facility]  . CHF (congestive heart failure) ([place]) 11/08/2010   2D Echo EF=>55%  .  Diabetes mellitus    insulin  . Diverticulosis   . Dyspnea   . Family hx of colon cancer   . HTN (hypertension)   . Hyperlipemia   . Hyperplastic colon polyp 07/16/07   30cm   . Myocardial infarction ([place]) 12/2012  . OSA (obstructive sleep apnea) 12/21/2007   sleep study perform REM 47mns REM and NREM was 95.0%, AHI of 12.31hr and RDI of 12.4hr    Social History   Socioeconomic History  . Marital status: Married    Spouse name: Not on file  . Number of children: 2  . Years of education: 17 . Highest education level: Not on file  Occupational History  . Occupation: disabled mDealer  Social Needs  . Financial resource strain: Not on file  . Food insecurity:    Worry: Not on file    Inability: Not on file  . Transportation needs:    Medical: Not on file    Non-medical: Not on file  Tobacco Use  . Smoking status: Never Smoker  .  Smokeless tobacco: Never Used  Substance and Sexual Activity  . Alcohol use: Yes    Comment: social-2 beers/wk  . Drug use: No  . Sexual activity: Yes    Birth control/protection: None  Lifestyle  . Physical activity:    Days per week: Not on file    Minutes per session: Not on file  . Stress: Not on file  Relationships  . Social connections:    Talks on phone: Not on file    Gets together: Not on file    Attends religious service: Not on file    Active member of club or organization: Not on file    Attends meetings of clubs or organizations: Not on file    Relationship status: Not on file  . Intimate partner violence:    Fear of current or ex partner: Not on file    Emotionally abused: Not on file    Physically abused: Not on file    Forced sexual activity: Not on file  Other Topics Concern  . Not on file  Social History Narrative  . Not on file     Family History  Problem Relation Age of Onset  . Colon cancer Father        550's  . Lung cancer Father   . Coronary artery disease Father 433      MI  . Arthritis Unknown   . Heart disease Unknown   . Diabetes Unknown      Review of Systems: General: negative for chills, fever, night sweats or weight changes.  Cardiovascular: negative for chest pain, dyspnea on exertion, edema, orthopnea, palpitations, paroxysmal nocturnal dyspnea or shortness of breath Dermatological: negative for rash Respiratory: negative for cough or wheezing Urologic: negative for hematuria Abdominal: negative for nausea, vomiting, diarrhea, bright red blood per rectum, melena, or hematemesis Neurologic: negative for visual changes, syncope, or dizziness All other systems reviewed and are otherwise negative except as noted above.    Blood pressure 134/60, pulse 72, height '5\' 8"'  (1.727 m), weight 201 lb (91.2 kg).  General appearance: alert, cooperative, no distress and mildly obese Neck: no carotid bruit and no JVD Lungs: clear to  auscultation bilaterally Heart: regular rate and rhythm Extremities: extremities normal, atraumatic, no cyanosis or edema Skin: Skin color, texture, turgor normal. No rashes or lesions Neurologic: Grossly normal  EKG NSR, PAC  ASSESSMENT AND PLAN:   Hx of CABG CABG 2006,  LIMA to LAD, free left radial artery to circumflex coronary artery.  CAD S/P percutaneous coronary angioplasty  s/p PCI on long mid RCA lesionsand distal RCA-RPLA lesion with PTCA / DES 2014  Type 2 diabetes mellitus with complication, with long-term current use of insulin (HCC) IDDM with vascular disease-recent HGb A1c- 8.1  Essential hypertension controlled  Mixed hyperlipidemia LDL 67 Aug 2018 (Trig 300).   PLAN  I will refill his SL NTG Rx and set him up for a GXT Myoview, this can be done in [place] for convenience.   Kerin Ransom PA-C 09/10/2017 1:37 PM

## 2017-09-10 NOTE — Assessment & Plan Note (Signed)
LDL 67 Aug 2018 (Trig 300).

## 2017-09-10 NOTE — Assessment & Plan Note (Signed)
IDDM with vascular disease-recent HGb A1c- 8.1

## 2017-09-17 ENCOUNTER — Encounter (HOSPITAL_COMMUNITY): Payer: BLUE CROSS/BLUE SHIELD

## 2017-09-17 ENCOUNTER — Encounter (HOSPITAL_COMMUNITY): Admission: RE | Admit: 2017-09-17 | Payer: BLUE CROSS/BLUE SHIELD | Source: Ambulatory Visit

## 2017-09-18 ENCOUNTER — Other Ambulatory Visit: Payer: Self-pay | Admitting: Cardiovascular Disease

## 2017-09-18 MED ORDER — TICAGRELOR 60 MG PO TABS: 60.0000 mg | ORAL_TABLET | Freq: Two times a day (BID) | ORAL | 3 refills | Status: DC

## 2017-09-18 MED ORDER — HYDROCHLOROTHIAZIDE 12.5 MG PO CAPS: ORAL_CAPSULE | ORAL | 3 refills | Status: DC

## 2017-09-18 NOTE — Telephone Encounter (Signed)
°*  STAT* If patient is at the pharmacy, call can be transferred to refill team.   1. Which medications need to be refilled? (please list name of each medication and dose if known) New prescriptions for Brilinta and Hydrochlorothiazide  2. Which pharmacy/location (including street and city if local pharmacy) is medication to be sent to?[facility]- [phone]  3. Do they need a 30 day or 90 day supply? 90 and refills

## 2017-09-19 ENCOUNTER — Ambulatory Visit (HOSPITAL_COMMUNITY)
Admission: RE | Admit: 2017-09-19 | Discharge: 2017-09-19 | Disposition: A | Discharge status: Home / Self Care | Payer: BLUE CROSS/BLUE SHIELD | Source: Ambulatory Visit | Attending: Cardiology | Admitting: Cardiology

## 2017-09-19 ENCOUNTER — Encounter (HOSPITAL_COMMUNITY): Payer: Self-pay

## 2017-09-19 ENCOUNTER — Encounter (HOSPITAL_COMMUNITY)
Admission: RE | Admit: 2017-09-19 | Discharge: 2017-09-19 | Disposition: A | Discharge status: Home / Self Care | Payer: BLUE CROSS/BLUE SHIELD | Source: Ambulatory Visit | Attending: Cardiology | Admitting: Cardiology

## 2017-09-19 DIAGNOSIS — Z9861 Coronary angioplasty status: Secondary | ICD-10-CM | POA: Insufficient documentation

## 2017-09-19 DIAGNOSIS — I2510 Atherosclerotic heart disease of native coronary artery without angina pectoris: Secondary | ICD-10-CM | POA: Insufficient documentation

## 2017-09-19 DIAGNOSIS — R079 Chest pain, unspecified: Secondary | ICD-10-CM | POA: Insufficient documentation

## 2017-09-19 DIAGNOSIS — Z794 Long term (current) use of insulin: Secondary | ICD-10-CM | POA: Insufficient documentation

## 2017-09-19 DIAGNOSIS — E118 Type 2 diabetes mellitus with unspecified complications: Secondary | ICD-10-CM | POA: Insufficient documentation

## 2017-09-19 DIAGNOSIS — Z951 Presence of aortocoronary bypass graft: Secondary | ICD-10-CM | POA: Diagnosis not present

## 2017-09-19 LAB — NM MYOCAR MULTI W/SPECT W/WALL MOTION / EF
Estimated workload: 8.2 METS
Exercise duration (min): 6 min
Exercise duration (sec): 24 s
LV dias vol: 123 mL (ref 62–150)
LV sys vol: 51 mL
MPHR: 160 {beats}/min
Peak HR: 102 {beats}/min
Percent HR: 63 %
RATE: 0.31
RPE: 15
Rest HR: 62 {beats}/min
SDS: 0
SRS: 0
SSS: 0
TID: 0.9

## 2017-09-19 MED ORDER — TECHNETIUM TC 99M TETROFOSMIN IV KIT
10.0000 | PACK | Freq: Once | INTRAVENOUS | Status: AC | PRN
  Administered 2017-09-19: 11 via INTRAVENOUS

## 2017-09-19 MED ORDER — SODIUM CHLORIDE 0.9% FLUSH
INTRAVENOUS | Status: AC
  Administered 2017-09-19: 10 mL via INTRAVENOUS
  Filled 2017-09-19: qty 10

## 2017-09-19 MED ORDER — TECHNETIUM TC 99M TETROFOSMIN IV KIT
30.0000 | PACK | Freq: Once | INTRAVENOUS | Status: AC | PRN
  Administered 2017-09-19: 31.5 via INTRAVENOUS

## 2017-09-19 MED ORDER — REGADENOSON 0.4 MG/5ML IV SOLN
INTRAVENOUS | Status: AC
  Administered 2017-09-19: 0.4 mg via INTRAVENOUS
  Filled 2017-09-19: qty 5

## 2017-09-23 ENCOUNTER — Other Ambulatory Visit: Payer: Self-pay | Admitting: "Endocrinology

## 2017-09-24 LAB — COMPREHENSIVE METABOLIC PANEL
A/G RATIO: 2.1 (ref 1.2–2.2)
ALT: 25 IU/L (ref 0–44)
AST: 18 IU/L (ref 0–40)
Albumin: 4.4 g/dL (ref 3.6–4.8)
Alkaline Phosphatase: 78 IU/L (ref 39–117)
BILIRUBIN TOTAL: 0.4 mg/dL (ref 0.0–1.2)
BUN/Creatinine Ratio: 16 (ref 10–24)
BUN: 14 mg/dL (ref 8–27)
CHLORIDE: 106 mmol/L (ref 96–106)
CO2: 23 mmol/L (ref 20–29)
Calcium: 9.4 mg/dL (ref 8.6–10.2)
Creatinine, Ser: 0.87 mg/dL (ref 0.76–1.27)
GFR calc Af Amer: 108 mL/min/{1.73_m2} (ref >59)
GFR calc non Af Amer: 94 mL/min/{1.73_m2} (ref >59)
GLUCOSE: 165 mg/dL — AB (ref 65–99)
Globulin, Total: 2.1 g/dL (ref 1.5–4.5)
POTASSIUM: 4.6 mmol/L (ref 3.5–5.2)
Sodium: 144 mmol/L (ref 134–144)
Total Protein: 6.5 g/dL (ref 6.0–8.5)

## 2017-09-24 LAB — SPECIMEN STATUS REPORT

## 2017-09-24 LAB — HGB A1C W/O EAG: HEMOGLOBIN A1C: 7.4 % — AB (ref 4.8–5.6)

## 2017-09-25 ENCOUNTER — Telehealth: Payer: Self-pay | Admitting: Cardiology

## 2017-09-25 NOTE — Telephone Encounter (Signed)
Spoke with pt, aware of myoview results.

## 2017-09-25 NOTE — Telephone Encounter (Signed)
New Message:      Pt is calling to get the results of stress test

## 2017-09-30 ENCOUNTER — Encounter: Payer: Self-pay | Admitting: "Endocrinology

## 2017-09-30 ENCOUNTER — Ambulatory Visit (INDEPENDENT_AMBULATORY_CARE_PROVIDER_SITE_OTHER): Payer: BLUE CROSS/BLUE SHIELD | Admitting: "Endocrinology

## 2017-09-30 VITALS — BP 138/82 | HR 76 | Ht 68.0 in | Wt 198.0 lb

## 2017-09-30 DIAGNOSIS — I10 Essential (primary) hypertension: Secondary | ICD-10-CM | POA: Diagnosis not present

## 2017-09-30 DIAGNOSIS — E1159 Type 2 diabetes mellitus with other circulatory complications: Secondary | ICD-10-CM

## 2017-09-30 DIAGNOSIS — E782 Mixed hyperlipidemia: Secondary | ICD-10-CM

## 2017-09-30 MED ORDER — FREESTYLE LIBRE 14 DAY READER DEVI: 1.0000 | Freq: Once | 0 refills | Status: AC

## 2017-09-30 MED ORDER — FREESTYLE LIBRE 14 DAY SENSOR MISC: 1.0000 | 2 refills | Status: DC

## 2017-09-30 MED ORDER — INSULIN GLARGINE 100 UNIT/ML SOLOSTAR PEN: PEN_INJECTOR | SUBCUTANEOUS | 2 refills | Status: DC

## 2017-09-30 NOTE — Patient Instructions (Signed)

## 2017-10-01 NOTE — Progress Notes (Signed)
Subjective:    Patient ID: Francis Hall, male    DOB: 14-Dec-1955,    Past Medical History:  Diagnosis Date  . Atrial fibrillation ([facility])   . CAD (coronary artery disease)    [facility]  . CHF (congestive heart failure) ([facility]) 11/08/2010   2D Echo EF=>55%  . Diabetes mellitus    insulin  . Diverticulosis   . Dyspnea   . Family hx of colon cancer   . HTN (hypertension)   . Hyperlipemia   . Hyperplastic colon polyp 07/16/07   30cm   . Myocardial infarction ([facility]) 12/2012  . OSA (obstructive sleep apnea) 12/21/2007   sleep study perform REM 58mns REM and NREM was 95.0%, AHI of 12.31hr and RDI of 12.4hr   Past Surgical History:  Procedure Laterality Date  . CARDIAC CATHETERIZATION  2006 2011   2 stents  . CATARACT EXTRACTION W/PHACO Left 12/03/2013   Procedure: CATARACT EXTRACTION PHACO AND INTRAOCULAR LENS PLACEMENT (IOC);  Surgeon: KTonny Branch MD;  Location: [facility];  Service: Ophthalmology;  Laterality: Left;  CDE:7.07  . CATARACT EXTRACTION W/PHACO Right 12/14/2013   Procedure: CATARACT EXTRACTION PHACO AND INTRAOCULAR LENS PLACEMENT (IOC);  Surgeon: KTonny Branch MD;  Location: [facility];  Service: Ophthalmology;  Laterality: Right;  CDE 7.84   . COLONOSCOPY  07/2007   Dr JArnoldo Morale hyperplastic polyp 30cm  . COLONOSCOPY N/A 08/12/2012   Procedure: COLONOSCOPY;  Surgeon: SDanie Binder MD;  Location: [facility];  Service: Endoscopy;  Laterality: N/A;  8:30  . CORONARY ARTERY BYPASS GRAFT  06/2004   x2 performed for 40% left main stenosis, 80% proximal LAD stenosis of the first diagonal artery, and 50% left circumfles artery priximal stenosis.LIMA to LAD and free radial artery to circumflex coronary artery  . LEFT HEART CATHETERIZATION WITH CORONARY/GRAFT ANGIOGRAM  12/29/2012   Procedure: LEFT HEART CATHETERIZATION WITH CBeatrix Fetters  Surgeon: DLeonie Man MD;  [facility];  Service: Cardiovascular;;  . TONSILLECTOMY  1962   Social History    Socioeconomic History  . Marital status: Married    Spouse name: Not on file  . Number of children: 2  . Years of education: 131 . Highest education level: Not on file  Occupational History  . Occupation: disabled mDealer  Social Needs  . Financial resource strain: Not on file  . Food insecurity:    Worry: Not on file    Inability: Not on file  . Transportation needs:    Medical: Not on file    Non-medical: Not on file  Tobacco Use  . Smoking status: Never Smoker  . Smokeless tobacco: Never Used  Substance and Sexual Activity  . Alcohol use: Yes    Comment: social-2 beers/wk  . Drug use: No  . Sexual activity: Yes    Birth control/protection: None  Lifestyle  . Physical activity:    Days per week: Not on file    Minutes per session: Not on file  . Stress: Not on file  Relationships  . Social connections:    Talks on phone: Not on file    Gets together: Not on file    Attends religious service: Not on file    Active member of club or organization: Not on file    Attends meetings of clubs or organizations: Not on file    Relationship status: Not on file  Other Topics Concern  . Not on file  Social History Narrative  . Not on file  Outpatient Encounter Medications as of 09/30/2017  Medication Sig  . acetaminophen (TYLENOL) 325 MG tablet Take 650 mg by mouth every 6 (six) hours as needed for moderate pain or headache.  [name] aspirin 81 MG EC tablet Take 81 mg by mouth daily.    [name] atorvastatin (LIPITOR) 20 MG tablet TAKE 1 TABLET BY MOUTH AT BEDTIME  . Blood Glucose Monitoring Suppl (CONTOUR NEXT MONITOR) w/Device KIT 1 each by Does not apply route 4 (four) times daily.  . carvedilol (COREG) 12.5 MG tablet Take 1 tablet (12.5 mg total) by mouth 2 (two) times daily with a meal.  . Cholecalciferol (VITAMIN D3) 1000 UNITS CAPS Take 2 capsules by mouth daily.  . [EXPIRED] Continuous Blood Gluc Receiver (FREESTYLE LIBRE 14 DAY READER) DEVI 1 each by Does not apply route  once for 1 dose.  . Continuous Blood Gluc Sensor (FREESTYLE LIBRE 14 DAY SENSOR) MISC Inject 1 each into the skin every 14 (fourteen) days. Use as directed.  . diltiazem (CARDIZEM CD) 120 MG 24 hr capsule Take 1 capsule (120 mg total) by mouth daily.  [name] glucose blood (CONTOUR NEXT TEST) test strip Use as instructed 4 x daily. E11.65  . hydrochlorothiazide (MICROZIDE) 12.5 MG capsule TAKE 1 CAPSULE BY MOUTH AS  NEEDED.  [name] insulin aspart (NOVOLOG FLEXPEN) 100 UNIT/ML FlexPen INJECT 8 TO 14 UNITS  SUBCUTANEOUSLY 3 TIMES  DAILY WITH MEALS  . Insulin Glargine (LANTUS SOLOSTAR) 100 UNIT/ML Solostar Pen INJECT 60 UNITS  SUBCUTANEOUSLY  UNITS AT BEDTIME  . lisinopril (PRINIVIL,ZESTRIL) 20 MG tablet TAKE 1 TABLET (20 MG TOTAL) BY MOUTH 2 (TWO) TIMES DAILY.  . metFORMIN (GLUCOPHAGE) 1000 MG tablet TAKE 1 TABLET BY MOUTH TWO TIMES DAILY WITH A MEAL  . Multiple Vitamin (MULTIVITAMIN) capsule Take 1 capsule by mouth daily.    . nitroGLYCERIN (NITROSTAT) 0.4 MG SL tablet PLACE 1 TABLET UNDER TONGUE IF NEEDED FOR CHEST PAIN..MAX OF 3 DOSES  . pantoprazole (PROTONIX) 40 MG tablet Take 40 mg by mouth daily.  . ticagrelor (BRILINTA) 60 MG TABS tablet Take 1 tablet (60 mg total) by mouth 2 (two) times daily.  . vitamin C (ASCORBIC ACID) 500 MG tablet Take 500 mg by mouth daily.  . [DISCONTINUED] Continuous Blood Gluc Sensor (FREESTYLE LIBRE SENSOR SYSTEM) MISC Use one sensor every 10 days.  . [DISCONTINUED] Insulin Glargine (LANTUS SOLOSTAR) 100 UNIT/ML Solostar Pen INJECT SUBCUTANEOUSLY 50  UNITS AT BEDTIME   No facility-administered encounter medications on file as of 09/30/2017.    ALLERGIES: Allergies  Allergen Reactions  . Crestor [Rosuvastatin] Other (See Comments)    myalgia   VACCINATION STATUS:  There is no immunization history on file for this patient.  Diabetes  He presents for his follow-up diabetic visit. He has type 2 diabetes mellitus. Onset time: He was diagnosed at approximate age of 27  years. His disease course has been improving. There are no hypoglycemic associated symptoms. Pertinent negatives for hypoglycemia include no confusion, headaches, pallor or seizures. There are no diabetic associated symptoms. Pertinent negatives for diabetes include no chest pain, no fatigue, no polydipsia, no polyphagia, no polyuria and no weakness. There are no hypoglycemic complications. Symptoms are improving. Diabetic complications include heart disease and peripheral neuropathy. Risk factors for coronary artery disease include dyslipidemia, diabetes mellitus, hypertension and male sex. Current diabetic treatment includes intensive insulin program. His weight is stable. He is following a generally unhealthy diet. He has had a previous visit with a dietitian. He participates in exercise intermittently. His  home blood glucose trend is increasing steadily (He still has fasting hyperglycemia driven mainly by heavy carbohydrate ingestion at supper, and evening snacks.). His breakfast blood glucose range is generally 180-200 mg/dl. His lunch blood glucose range is generally 140-180 mg/dl. His dinner blood glucose range is generally 140-180 mg/dl. His bedtime blood glucose range is generally 140-180 mg/dl. His overall blood glucose range is 140-180 mg/dl. An ACE inhibitor/angiotensin II receptor blocker is being taken. Eye exam is current.  Hypertension  This is a chronic problem. The current episode started more than 1 year ago. The problem has been waxing and waning since onset. Pertinent negatives include no chest pain, headaches, neck pain, palpitations or shortness of breath. Risk factors for coronary artery disease include diabetes mellitus, dyslipidemia, male gender and sedentary lifestyle. Past treatments include ACE inhibitors and beta blockers. Hypertensive end-organ damage includes CAD/MI.  Hyperlipidemia  This is a chronic problem. The current episode started more than 1 year ago. The problem is  controlled. Recent lipid tests were reviewed and are high. Exacerbating diseases include diabetes and obesity. Pertinent negatives include no chest pain, myalgias or shortness of breath. Current antihyperlipidemic treatment includes statins. Risk factors for coronary artery disease include diabetes mellitus, dyslipidemia, hypertension, male sex, a sedentary lifestyle, obesity and family history.     Review of Systems  Constitutional: Negative for fatigue and unexpected weight change.  HENT: Negative for dental problem, mouth sores and trouble swallowing.   Eyes: Negative for visual disturbance.  Respiratory: Negative for cough, choking, chest tightness, shortness of breath and wheezing.   Cardiovascular: Negative for chest pain, palpitations and leg swelling.  Gastrointestinal: Negative for abdominal distention, abdominal pain, constipation, diarrhea, nausea and vomiting.  Endocrine: Negative for polydipsia, polyphagia and polyuria.  Genitourinary: Negative for dysuria, flank pain, hematuria and urgency.  Musculoskeletal: Negative for back pain, gait problem, myalgias and neck pain.  Skin: Negative for pallor, rash and wound.  Neurological: Negative for seizures, syncope, weakness, numbness and headaches.  Psychiatric/Behavioral: Negative.  Negative for confusion and dysphoric mood.    Objective:    BP 138/82   Pulse 76   Ht '5\' 8"'  (1.727 m)   Wt 198 lb (89.8 kg)   BMI 30.11 kg/m   Wt Readings from Last 3 Encounters:  09/30/17 198 lb (89.8 kg)  09/10/17 201 lb (91.2 kg)  06/27/17 202 lb (91.6 kg)    Physical Exam  Constitutional: He is oriented to person, place, and time. He appears well-developed. He is cooperative. No distress.  HENT:  Head: Normocephalic and atraumatic.  Eyes: EOM are normal.  Neck: Normal range of motion. Neck supple. No tracheal deviation present. No thyromegaly present.  Cardiovascular: Normal rate, S1 normal and S2 normal. Exam reveals no gallop.  No  murmur heard. Pulses:      Dorsalis pedis pulses are 1+ on the right side, and 1+ on the left side.       Posterior tibial pulses are 1+ on the right side, and 1+ on the left side.  Pulmonary/Chest: Effort normal. No respiratory distress. He has no wheezes.  Abdominal: He exhibits no distension. There is no tenderness. There is no guarding and no CVA tenderness.  Musculoskeletal: He exhibits no edema.       Right shoulder: He exhibits no swelling and no deformity.  Neurological: He is alert and oriented to person, place, and time. He has normal strength and normal reflexes. No cranial nerve deficit or sensory deficit. Gait normal.  Skin: Skin is warm  and dry. No rash noted. No cyanosis. Nails show no clubbing.  Psychiatric: He has a normal mood and affect. His speech is normal. Judgment normal. Cognition and memory are normal.    Results for orders placed or performed in visit on 09/23/17  Comprehensive metabolic panel  Result Value Ref Range   Glucose 165 (H) 65 - 99 mg/dL   BUN 14 8 - 27 mg/dL   Creatinine, Ser 0.87 0.76 - 1.27 mg/dL   GFR calc non Af Amer 94 >59 mL/min/1.73   GFR calc Af Amer 108 >59 mL/min/1.73   BUN/Creatinine Ratio 16 10 - 24   Sodium 144 134 - 144 mmol/L   Potassium 4.6 3.5 - 5.2 mmol/L   Chloride 106 96 - 106 mmol/L   CO2 23 20 - 29 mmol/L   Calcium 9.4 8.6 - 10.2 mg/dL   Total Protein 6.5 6.0 - 8.5 g/dL   Albumin 4.4 3.6 - 4.8 g/dL   Globulin, Total 2.1 1.5 - 4.5 g/dL   Albumin/Globulin Ratio 2.1 1.2 - 2.2   Bilirubin Total 0.4 0.0 - 1.2 mg/dL   Alkaline Phosphatase 78 39 - 117 IU/L   AST 18 0 - 40 IU/L   ALT 25 0 - 44 IU/L  Hgb A1c w/o eAG  Result Value Ref Range   Hgb A1c MFr Bld 7.4 (H) 4.8 - 5.6 %  Specimen status report  Result Value Ref Range   specimen status report Comment    Diabetic Labs (most recent): Lab Results  Component Value Date   HGBA1C 7.4 (H) 09/23/2017   HGBA1C 8.1 (H) 06/18/2017   HGBA1C 6.6 (H) 10/10/2016   Lipid Panel      Component Value Date/Time   CHOL 163 01/22/2017 1039   TRIG 309 (H) 01/22/2017 1039   HDL 34 (L) 01/22/2017 1039   CHOLHDL 4.8 01/22/2017 1039   CHOLHDL 7.0 12/29/2012 0438   VLDL UNABLE TO CALCULATE IF TRIGLYCERIDE OVER 400 mg/dL 12/29/2012 0438   LDLCALC 67 01/22/2017 1039     Assessment & Plan:   1. Type 2 diabetes mellitus with vascular disease ([place])  His diabetes is  complicated by coronary artery disease status post CABG in 2006 and recent stent placement in 2014. Patient came with improving blood glucose profile, averaging 175 and A1c of 7.4%, improving from 8.1%.    - He admits to significant dietary indiscretion during the holiday season.   Glucose logs and insulin administration records pertaining to this visit,  to be scanned into patient's records.    -Recent labs reviewed. - [name] remains at a high risk for more acute and chronic complications of diabetes which include CAD, CVA, CKD, retinopathy, and neuropathy. These are all discussed in detail with the patient.  - I have re-counseled the patient on diet management and weight loss  by adopting a carbohydrate restricted / protein rich  Diet.  - Patient is advised to stick to a routine mealtimes to eat 3 meals  a day and avoid unnecessary snacks ( to snack only to correct hypoglycemia).  -  Suggestion is made for him to avoid simple carbohydrates  from his diet including Cakes, Sweet Desserts / Pastries, Ice Cream, Soda (diet and regular), Sweet Tea, Candies, Chips, Cookies, Store Bought Juices, Alcohol in Excess of  1-2 drinks a day, Artificial Sweeteners, and "Sugar-free" Products. This will help patient to have stable blood glucose profile and potentially avoid unintended weight gain.  - I have approached patient with the  following individualized plan to manage diabetes and patient agrees.  -  He'll continue to require basal/bolus insulin to treat diabetes to target.  -Is benefiting from the CGM device,  advised to wear it at all times. - I advised him to increase Lantus to 60 units daily at bedtime , continue NovoLog  10 units 3 times a day before meals  for pre-meal BG readings of 90-110m/dl, plus patient specific correction dose of rapid acting insulin  for unexpected hyperglycemia above 1574mdl, associated with strict monitoring of glucose  AC and HS. - Patient is warned not to take insulin without proper monitoring per orders. -Adjustment parameters are given for hypo and hyperglycemia in writing. -Patient is encouraged to call clinic for blood glucose levels less than 70 or above 300 mg /dl.  - I will continue metformin 1000 mg by mouth twice a day .  - Patient specific target  for A1c; LDL, HDL, Triglycerides, and  Waist Circumference were discussed in detail.  2) BP/HTN: His blood pressure is controlled to target.  Advised to continue  current medications including ACEI, beta blockers   3) Lipids/HPL: Uncontrolled with triglycerides at   309, aggressively  improving from 1700. I urged him to be consistent with his Lovaza 2 g by mouth twice a day, fenofibrate 145 mg by mouth daily, and  Lipitor 20 mg by mouth daily at bedtime.  4)  Weight/Diet: CDE consult in progress, exercise, and carbohydrates information provided.  5) Chronic Care/Health Maintenance:  -Patient  Is on ACEI and Statin medications and encouraged to continue to follow up with Ophthalmology, Podiatrist at least yearly or according to recommendations, and advised to stay away from smoking. I have recommended yearly flu vaccine and pneumonia vaccination at least every 5 years; moderate intensity exercise for up to 150 minutes weekly; and  sleep for at least 7 hours a day.  I advised patient to maintain close follow up with his PCP for primary care needs. - Time spent with the patient: 25 min, of which >50% was spent in reviewing his blood glucose logs , discussing his hypo- and hyper-glycemic episodes, reviewing his  current and  previous labs and insulin doses and developing a plan to avoid hypo- and hyper-glycemia. Please refer to Patient Instructions for Blood Glucose Monitoring and Insulin/Medications Dosing Guide"  in media tab for additional information. DaJacob Mooresarticipated in the discussions, expressed understanding, and voiced agreement with the above plans.  All questions were answered to his satisfaction. he is encouraged to contact clinic should he have any questions or concerns prior to his return visit.  Follow up plan: Return in about 3 months (around 12/30/2017) for follow up with pre-visit labs, meter, and logs.  GeGlade LloydMD Phone: [phone]: [phone]-  This note was partially dictated with voice recognition software. Similar sounding words can be transcribed inadequately or may not  be corrected upon review.  10/01/2017, 9:06 AM

## 2017-10-02 ENCOUNTER — Telehealth: Payer: Self-pay | Admitting: Cardiology

## 2017-10-02 ENCOUNTER — Other Ambulatory Visit: Payer: Self-pay

## 2017-10-02 NOTE — Telephone Encounter (Signed)
Spoke with Pt's wife and advised of Dr. Loletha Grayer recommendation. Verbalized understanding. Discontinued Brilinta and updated med list.

## 2017-10-02 NOTE — Telephone Encounter (Signed)
Spoke with Pt's wife who states due to new insurance they can no longer afford Brilinta. With new insurance they cost would be over $300. Wife requesting to see if pt could be prescribe a medication that's similar to Brilinta but more cost efficient. Routed to Dr. Jamas Lav nd Pharm D for recommendation.

## 2017-10-02 NOTE — Telephone Encounter (Signed)
He is a plavix non-reponder, but has not had any acute events in years. Can stop Brilinta and switch to ASA 81 mg daily. MCr

## 2017-10-02 NOTE — Telephone Encounter (Signed)
Pt c/o medication issue:  1. Name of Medication:  ticagrelor (BRILINTA) 60 MG TABS tablet    2. How are you currently taking this medication (dosage and times per day)? Take 1 tablet (60 mg total) by mouth 2 (two) times daily.  3. Are you having a reaction (difficulty breathing--STAT)? no 4. What is your medication issue? Pt need a Cheaper medication that's similar to the [place] or a discount card

## 2017-10-02 NOTE — Telephone Encounter (Signed)
Antiplatelets therapy recommendations always come from MD.  We can not change this medication.  Sorry

## 2017-10-05 ENCOUNTER — Other Ambulatory Visit: Payer: Self-pay | Admitting: "Endocrinology

## 2017-10-08 ENCOUNTER — Other Ambulatory Visit: Payer: Self-pay | Admitting: *Deleted

## 2017-10-08 ENCOUNTER — Ambulatory Visit: Payer: BLUE CROSS/BLUE SHIELD | Admitting: Gastroenterology

## 2017-10-08 ENCOUNTER — Encounter: Payer: Self-pay | Admitting: *Deleted

## 2017-10-08 ENCOUNTER — Encounter: Payer: Self-pay | Admitting: Gastroenterology

## 2017-10-08 VITALS — BP 147/78 | HR 59 | Temp 97.6°F | Ht 68.0 in | Wt 201.2 lb

## 2017-10-08 DIAGNOSIS — Z800 Family history of malignant neoplasm of digestive organs: Secondary | ICD-10-CM

## 2017-10-08 DIAGNOSIS — K219 Gastro-esophageal reflux disease without esophagitis: Secondary | ICD-10-CM | POA: Insufficient documentation

## 2017-10-08 DIAGNOSIS — Z1211 Encounter for screening for malignant neoplasm of colon: Secondary | ICD-10-CM

## 2017-10-08 MED ORDER — PEG 3350-KCL-NA BICARB-NACL 420 G PO SOLR: 4000.0000 mL | Freq: Once | ORAL | 0 refills | Status: AC

## 2017-10-08 NOTE — Patient Instructions (Addendum)
We have scheduled you for a colonoscopy with Dr. Oneida Alar in the near future. Do not take any metformin the day of the procedure. Only take 1/2 dose of Lantus the evening before.  Continue the Protonix once daily, 30 minutes before breakfast. Call us if there is any issues with abdominal pain, nausea, vomiting, problems swallowing.   We will see you in 6 months!  It was a pleasure to see you today. I strive to create trusting relationships with patients to provide genuine, compassionate, and quality care. I value your feedback. If you receive a survey regarding your visit,  I greatly appreciate you taking time to fill this out.   Annitta Needs, PhD, ANP-BC [facility]    Food Choices for Gastroesophageal Reflux Disease, Adult When you have gastroesophageal reflux disease (GERD), the foods you eat and your eating habits are very important. Choosing the right foods can help ease the discomfort of GERD. Consider working with a diet and nutrition specialist (dietitian) to help you make healthy food choices. What general guidelines should I follow? Eating plan  Choose healthy foods low in fat, such as fruits, vegetables, whole grains, low-fat dairy products, and lean meat, fish, and poultry.  Eat frequent, small meals instead of three large meals each day. Eat your meals slowly, in a relaxed setting. Avoid bending over or lying down until 2-3 hours after eating.  Limit high-fat foods such as fatty meats or fried foods.  Limit your intake of oils, butter, and shortening to less than 8 teaspoons each day.  Avoid the following: ? Foods that cause symptoms. These may be different for different people. Keep a food diary to keep track of foods that cause symptoms. ? Alcohol. ? Drinking large amounts of liquid with meals. ? Eating meals during the 2-3 hours before bed.  Cook foods using methods other than frying. This may include baking, grilling, or  broiling. Lifestyle   Maintain a healthy weight. Ask your health care provider what weight is healthy for you. If you need to lose weight, work with your health care provider to do so safely.  Exercise for at least 30 minutes on 5 or more days each week, or as told by your health care provider.  Avoid wearing clothes that fit tightly around your waist and chest.  Do not use any products that contain nicotine or tobacco, such as cigarettes and e-cigarettes. If you need help quitting, ask your health care provider.  Sleep with the head of your bed raised. Use a wedge under the mattress or blocks under the bed frame to raise the head of the bed. What foods are not recommended? The items listed may not be a complete list. Talk with your dietitian about what dietary choices are best for you. Grains Pastries or quick breads with added fat. [place] toast. Vegetables Deep fried vegetables. [place] fries. Any vegetables prepared with added fat. Any vegetables that cause symptoms. For some people this may include tomatoes and tomato products, chili peppers, onions and garlic, and horseradish. Fruits Any fruits prepared with added fat. Any fruits that cause symptoms. For some people this may include citrus fruits, such as oranges, grapefruit, pineapple, and lemons. Meats and other protein foods High-fat meats, such as fatty beef or pork, hot dogs, ribs, ham, sausage, salami and bacon. Fried meat or protein, including fried fish and fried chicken. Nuts and nut butters. Dairy Whole milk and chocolate milk. Sour cream. Cream. Ice cream. Cream cheese. Milk shakes. Beverages Coffee and  tea, with or without caffeine. Carbonated beverages. Sodas. Energy drinks. Fruit juice made with acidic fruits (such as orange or grapefruit). Tomato juice. Alcoholic drinks. Fats and oils Butter. Margarine. Shortening. Ghee. Sweets and desserts Chocolate and cocoa. Donuts. Seasoning and other foods Pepper. Peppermint  and spearmint. Any condiments, herbs, or seasonings that cause symptoms. For some people, this may include curry, hot sauce, or vinegar-based salad dressings. Summary  When you have gastroesophageal reflux disease (GERD), food and lifestyle choices are very important to help ease the discomfort of GERD.  Eat frequent, small meals instead of three large meals each day. Eat your meals slowly, in a relaxed setting. Avoid bending over or lying down until 2-3 hours after eating.  Limit high-fat foods such as fatty meat or fried foods. This information is not intended to replace advice given to you by your health care provider. Make sure you discuss any questions you have with your health care provider. Document Released: 05/21/2005 Document Revised: 05/22/2016 Document Reviewed: 05/22/2016 Elsevier Interactive Patient Education  [employer].

## 2017-10-08 NOTE — Assessment & Plan Note (Signed)
62 year old male due for high risk screening colonoscopy with family history of colon cancer in first degree relative age (father in his 69s). No personal history of adenomas. Hyperplastic polyp in 2009, and his last colonoscopy was in 2014 with diverticula and internal hemorrhoids. No concerning lower GI symptoms.  Proceed with colonoscopy with Dr. Oneida Alar in the near future. The risks, benefits, and alternatives have been discussed in detail with the patient. They state understanding and desire to proceed.  1/2 Lantus evening prior No oral diabetes medications the day of procedure

## 2017-10-08 NOTE — Progress Notes (Addendum)
REVIEWED-NO ADDITIONAL RECOMMENDATIONS.  Primary Care Physician:  Sharilyn Sites, MD Primary Gastroenterologist:  Dr. Oneida Alar   Chief Complaint  Patient presents with  . Colonoscopy    due for 5 yr tcs    HPI:   Francis Hall is a 62 y.o. male presenting today to schedule surveillance colonoscopy. Remote history of hyperplastic polyp in 2009. Most recent colonoscopy 2014 with diverticula and internal hemorrhoids. Father with history of colon cancer in his 18s.   Had flare of GERD about a month ago after eating at [facility]. Started on Protonix, which is doing well. No dysphagia. No loss of appetite. Recently had cardiac low risk study, EF 59%. No N/V. No abdominal pain. No melena or hematochezia.   Past Medical History:  Diagnosis Date  . Atrial fibrillation ([place])   . CAD (coronary artery disease)    [place]  . CHF (congestive heart failure) ([place]) 11/08/2010   2D Echo EF=>55%  . Diabetes mellitus    insulin  . Diverticulosis   . Dyspnea   . Family hx of colon cancer   . HTN (hypertension)   . Hyperlipemia   . Hyperplastic colon polyp 07/16/07   30cm   . Myocardial infarction ([place]) 12/2012  . OSA (obstructive sleep apnea) 12/21/2007   sleep study perform REM 67mns REM and NREM was 95.0%, AHI of 12.31hr and RDI of 12.4hr    Past Surgical History:  Procedure Laterality Date  . CARDIAC CATHETERIZATION  2006 2011   2 stents  . CATARACT EXTRACTION W/PHACO Left 12/03/2013   Procedure: CATARACT EXTRACTION PHACO AND INTRAOCULAR LENS PLACEMENT (IOC);  Surgeon: KTonny Branch MD;  Location: [facility];  Service: Ophthalmology;  Laterality: Left;  CDE:7.07  . CATARACT EXTRACTION W/PHACO Right 12/14/2013   Procedure: CATARACT EXTRACTION PHACO AND INTRAOCULAR LENS PLACEMENT (IOC);  Surgeon: KTonny Branch MD;  Location: [facility];  Service: Ophthalmology;  Laterality: Right;  CDE 7.84   . COLONOSCOPY  07/2007   Dr JArnoldo Morale hyperplastic polyp 30cm  . COLONOSCOPY N/A 08/12/2012   Procedure:  COLONOSCOPY;  Surgeon: SDanie Binder MD;  Location: [facility] SU[facility];  Service: Endoscopy;  Laterality: N/A;  8:30  . CORONARY ARTERY BYPASS GRAFT  06/2004   x2 performed for 40% left main stenosis, 80% proximal LAD stenosis of the first diagonal artery, and 50% left circumfles artery priximal stenosis.LIMA to LAD and free radial artery to circumflex coronary artery  . LEFT HEART CATHETERIZATION WITH CORONARY/GRAFT ANGIOGRAM  12/29/2012   Procedure: LEFT HEART CATHETERIZATION WITH CBeatrix Fetters  Surgeon: DLeonie Man MD;  [facility];  Service: Cardiovascular;;  . TONSILLECTOMY  1962    Current Outpatient Medications  Medication Sig Dispense Refill  . acetaminophen (TYLENOL) 325 MG tablet Take 650 mg by mouth every 6 (six) hours as needed for moderate pain or headache.    .Marland Kitchenaspirin 81 MG EC tablet Take 81 mg by mouth daily.      .Marland Kitchenatorvastatin (LIPITOR) 20 MG tablet TAKE 1 TABLET BY MOUTH EVERYDAY AT BEDTIME 30 tablet 2  . Blood Glucose Monitoring Suppl (CONTOUR NEXT MONITOR) w/Device KIT 1 each by Does not apply route 4 (four) times daily. 1 kit 0  . carvedilol (COREG) 12.5 MG tablet Take 1 tablet (12.5 mg total) by mouth 2 (two) times daily with a meal. 60 tablet 4  . Cholecalciferol (VITAMIN D3) 1000 UNITS CAPS Take 2 capsules by mouth daily.    . Continuous Blood Gluc Sensor (FREESTYLE LIBRE  14 DAY SENSOR) MISC Inject 1 each into the skin every 14 (fourteen) days. Use as directed. 2 each 2  . diltiazem (CARDIZEM CD) 120 MG 24 hr capsule Take 1 capsule (120 mg total) by mouth daily. 90 capsule 1  . glucose blood (CONTOUR NEXT TEST) test strip Use as instructed 4 x daily. E11.65 150 each 5  . hydrochlorothiazide (MICROZIDE) 12.5 MG capsule TAKE 1 CAPSULE BY MOUTH AS  NEEDED. 90 capsule 3  . insulin aspart (NOVOLOG FLEXPEN) 100 UNIT/ML FlexPen INJECT 8 TO 14 UNITS  SUBCUTANEOUSLY 3 TIMES  DAILY WITH MEALS 15 mL 2  . Insulin Glargine (LANTUS SOLOSTAR) 100 UNIT/ML Solostar  Pen INJECT 60 UNITS  SUBCUTANEOUSLY  UNITS AT BEDTIME 15 mL 2  . lisinopril (PRINIVIL,ZESTRIL) 20 MG tablet TAKE 1 TABLET (20 MG TOTAL) BY MOUTH 2 (TWO) TIMES DAILY. 30 tablet 2  . metFORMIN (GLUCOPHAGE) 1000 MG tablet TAKE 1 TABLET BY MOUTH TWO TIMES DAILY WITH A MEAL 60 tablet 2  . Multiple Vitamin (MULTIVITAMIN) capsule Take 1 capsule by mouth daily.      . nitroGLYCERIN (NITROSTAT) 0.4 MG SL tablet PLACE 1 TABLET UNDER TONGUE IF NEEDED FOR CHEST PAIN.[name] OF 3 DOSES 25 tablet 5  . pantoprazole (PROTONIX) 40 MG tablet Take 40 mg by mouth daily.    . vitamin C (ASCORBIC ACID) 500 MG tablet Take 500 mg by mouth daily.     No current facility-administered medications for this visit.     Allergies as of 10/08/2017 - Review Complete 10/08/2017  Allergen Reaction Noted  . Crestor [rosuvastatin] Other (See Comments) 09/22/2012    Family History  Problem Relation Age of Onset  . Colon cancer Father        22's?  . Lung cancer Father   . Coronary artery disease Father 47       MI  . Arthritis Unknown   . Heart disease Unknown   . Diabetes Unknown     Social History   Socioeconomic History  . Marital status: Married    Spouse name: Not on file  . Number of children: 2  . Years of education: 51  . Highest education level: Not on file  Occupational History  . Occupation: disabled Dealer   Social Needs  . Financial resource strain: Not on file  . Food insecurity:    Worry: Not on file    Inability: Not on file  . Transportation needs:    Medical: Not on file    Non-medical: Not on file  Tobacco Use  . Smoking status: Never Smoker  . Smokeless tobacco: Never Used  Substance and Sexual Activity  . Alcohol use: Yes    Comment: social-2 beers/wk  . Drug use: No  . Sexual activity: Yes    Birth control/protection: None  Lifestyle  . Physical activity:    Days per week: Not on file    Minutes per session: Not on file  . Stress: Not on file  Relationships  . Social  connections:    Talks on phone: Not on file    Gets together: Not on file    Attends religious service: Not on file    Active member of club or organization: Not on file    Attends meetings of clubs or organizations: Not on file    Relationship status: Not on file  . Intimate partner violence:    Fear of current or ex partner: Not on file    Emotionally abused: Not on file  Physically abused: Not on file    Forced sexual activity: Not on file  Other Topics Concern  . Not on file  Social History Narrative  . Not on file    Review of Systems: Gen: Denies any fever, chills, fatigue, weight loss, lack of appetite.  CV: Denies chest pain, heart palpitations, peripheral edema, syncope.  Resp: Denies shortness of breath at rest or with exertion. Denies wheezing or cough.  GI: see HPI  GU : Denies urinary burning, urinary frequency, urinary hesitancy MS: Denies joint pain, muscle weakness, cramps, or limitation of movement.  Derm: Denies rash, itching, dry skin Psych: Denies depression, anxiety, memory loss, and confusion Heme: Denies bruising, bleeding, and enlarged lymph nodes.  Physical Exam: BP (!) 147/78   Pulse (!) 59   Temp 97.6 F (36.4 C) (Oral)   Ht '5\' 8"'  (1.727 m)   Wt 201 lb 3.2 oz (91.3 kg)   BMI 30.59 kg/m  General:   Alert and oriented. Pleasant and cooperative. Well-nourished and well-developed.  Head:  Normocephalic and atraumatic. Eyes:  Without icterus, sclera clear and conjunctiva pink.  Ears:  Normal auditory acuity. Nose:  No deformity, discharge,  or lesions. Mouth:  No deformity or lesions, oral mucosa pink.  Lungs:  Clear to auscultation bilaterally. No wheezes, rales, or rhonchi. No distress.  Heart:  S1, S2 present irregularly irregular  Abdomen:  +BS, soft, non-tender and non-distended. No HSM noted. No guarding or rebound. No masses appreciated.  Rectal:  Deferred  Msk:  Symmetrical without gross deformities. Normal posture. Extremities:   Without edema. Neurologic:  Alert and  oriented x 4 Psych:  Alert and cooperative. Normal mood and affect.

## 2017-10-08 NOTE — Assessment & Plan Note (Signed)
Denies chronic history of GERD. Recent GERD symptoms secondary to dietary intake. Started on Protonix by PCP and doing well now. No dysphagia, abdominal pain, loss of appetite, early satiety, weight loss. Monitor for now. Return in 6 months.

## 2017-10-08 NOTE — Progress Notes (Signed)
CC'D TO PCP °

## 2017-11-05 ENCOUNTER — Other Ambulatory Visit: Payer: Self-pay | Admitting: "Endocrinology

## 2017-11-11 ENCOUNTER — Ambulatory Visit (HOSPITAL_COMMUNITY)
Admission: RE | Admit: 2017-11-11 | Discharge: 2017-11-11 | Disposition: A | Discharge status: Home / Self Care | Payer: BLUE CROSS/BLUE SHIELD | Source: Ambulatory Visit | Attending: Gastroenterology | Admitting: Gastroenterology

## 2017-11-11 ENCOUNTER — Other Ambulatory Visit: Payer: Self-pay

## 2017-11-11 ENCOUNTER — Encounter (HOSPITAL_COMMUNITY)
Admission: RE | Disposition: A | Discharge status: Home / Self Care | Payer: Self-pay | Source: Ambulatory Visit | Attending: Gastroenterology

## 2017-11-11 ENCOUNTER — Encounter (HOSPITAL_COMMUNITY): Payer: Self-pay | Admitting: *Deleted

## 2017-11-11 DIAGNOSIS — D122 Benign neoplasm of ascending colon: Secondary | ICD-10-CM | POA: Diagnosis not present

## 2017-11-11 DIAGNOSIS — K219 Gastro-esophageal reflux disease without esophagitis: Secondary | ICD-10-CM | POA: Insufficient documentation

## 2017-11-11 DIAGNOSIS — Z951 Presence of aortocoronary bypass graft: Secondary | ICD-10-CM | POA: Insufficient documentation

## 2017-11-11 DIAGNOSIS — I509 Heart failure, unspecified: Secondary | ICD-10-CM | POA: Diagnosis not present

## 2017-11-11 DIAGNOSIS — I4891 Unspecified atrial fibrillation: Secondary | ICD-10-CM | POA: Insufficient documentation

## 2017-11-11 DIAGNOSIS — Z794 Long term (current) use of insulin: Secondary | ICD-10-CM | POA: Insufficient documentation

## 2017-11-11 DIAGNOSIS — Z7982 Long term (current) use of aspirin: Secondary | ICD-10-CM | POA: Insufficient documentation

## 2017-11-11 DIAGNOSIS — Z1211 Encounter for screening for malignant neoplasm of colon: Secondary | ICD-10-CM | POA: Insufficient documentation

## 2017-11-11 DIAGNOSIS — E785 Hyperlipidemia, unspecified: Secondary | ICD-10-CM | POA: Insufficient documentation

## 2017-11-11 DIAGNOSIS — D123 Benign neoplasm of transverse colon: Secondary | ICD-10-CM | POA: Diagnosis not present

## 2017-11-11 DIAGNOSIS — E119 Type 2 diabetes mellitus without complications: Secondary | ICD-10-CM | POA: Insufficient documentation

## 2017-11-11 DIAGNOSIS — Z800 Family history of malignant neoplasm of digestive organs: Secondary | ICD-10-CM | POA: Insufficient documentation

## 2017-11-11 DIAGNOSIS — D125 Benign neoplasm of sigmoid colon: Secondary | ICD-10-CM | POA: Diagnosis not present

## 2017-11-11 DIAGNOSIS — I110 Hypertensive heart disease with heart failure: Secondary | ICD-10-CM | POA: Insufficient documentation

## 2017-11-11 DIAGNOSIS — K644 Residual hemorrhoidal skin tags: Secondary | ICD-10-CM | POA: Diagnosis not present

## 2017-11-11 DIAGNOSIS — K5730 Diverticulosis of large intestine without perforation or abscess without bleeding: Secondary | ICD-10-CM | POA: Insufficient documentation

## 2017-11-11 DIAGNOSIS — Z79899 Other long term (current) drug therapy: Secondary | ICD-10-CM | POA: Insufficient documentation

## 2017-11-11 DIAGNOSIS — G4733 Obstructive sleep apnea (adult) (pediatric): Secondary | ICD-10-CM | POA: Insufficient documentation

## 2017-11-11 DIAGNOSIS — I2510 Atherosclerotic heart disease of native coronary artery without angina pectoris: Secondary | ICD-10-CM | POA: Diagnosis not present

## 2017-11-11 DIAGNOSIS — I252 Old myocardial infarction: Secondary | ICD-10-CM | POA: Diagnosis not present

## 2017-11-11 HISTORY — DX: Gastro-esophageal reflux disease without esophagitis: K21.9

## 2017-11-11 HISTORY — PX: COLONOSCOPY: SHX5424

## 2017-11-11 HISTORY — PX: POLYPECTOMY: SHX5525

## 2017-11-11 LAB — GLUCOSE, CAPILLARY: GLUCOSE-CAPILLARY: 131 mg/dL — AB (ref 65–99)

## 2017-11-11 SURGERY — COLONOSCOPY
Anesthesia: Moderate Sedation

## 2017-11-11 MED ORDER — MEPERIDINE HCL 100 MG/ML IJ SOLN
INTRAMUSCULAR | Status: AC
  Filled 2017-11-11: qty 2

## 2017-11-11 MED ORDER — MIDAZOLAM HCL 5 MG/5ML IJ SOLN
INTRAMUSCULAR | Status: AC
  Filled 2017-11-11: qty 10

## 2017-11-11 MED ORDER — MEPERIDINE HCL 100 MG/ML IJ SOLN
INTRAMUSCULAR | Status: DC | PRN
  Administered 2017-11-11: 50 mg via INTRAVENOUS
  Administered 2017-11-11: 25 mg via INTRAVENOUS

## 2017-11-11 MED ORDER — MIDAZOLAM HCL 5 MG/5ML IJ SOLN
INTRAMUSCULAR | Status: DC | PRN
  Administered 2017-11-11 (×2): 2 mg via INTRAVENOUS

## 2017-11-11 MED ORDER — STERILE WATER FOR IRRIGATION IR SOLN
Status: DC | PRN
  Administered 2017-11-11: 2.5 mL

## 2017-11-11 MED ORDER — SODIUM CHLORIDE 0.9 % IV SOLN
INTRAVENOUS | Status: DC
  Administered 2017-11-11: 08:00:00 via INTRAVENOUS

## 2017-11-11 NOTE — Discharge Instructions (Signed)
You have small EXTERNALl hemorrhoids and diverticulosis IN YOUR LEFT COLON. YOU HAD FIVE POLYP REMOVED. ONE WAS NOT RETRIEVED.   DRINK WATER TO KEEP YOUR URINE LIGHT YELLOW.  CONTINUE YOUR WEIGHT LOSS EFFORTS. YOUR BODY MASS INDEX IS OVER 30 WHICH MEANS YOU ARE OBESE. OBESITY IS ASSOCIATED WITH AN INCREASED FOR CIRRHOSIS AND ALL CANCERS, INCLUDING ESOPHAGEAL AND COLON CANCER. A WEIGHT OF 195 LBS OR LESS  WILL GET YOUR BODY MASS INDEX(BMI) UNDER 30.  FOLLOW A HIGH FIBER DIET. AVOID ITEMS THAT CAUSE BLOATING. See info below.  YOUR BIOPSY RESULTS WILL BE AVAILABLE IN 7 DAYS.   USE PREPARATION H FOUR TIMES  A DAY IF NEEDED TO RELIEVE RECTAL PAIN/PRESSURE/BLEEDING.  Next colonoscopy in 3 years.  Colonoscopy Care After Read the instructions outlined below and refer to this sheet in the next week. These discharge instructions provide you with general information on caring for yourself after you leave the hospital. While your treatment has been planned according to the most current medical practices available, unavoidable complications occasionally occur. If you have any problems or questions after discharge, call DR. Lysander Calixte, [phone].  ACTIVITY  You may resume your regular activity, but move at a slower pace for the next 24 hours.   Take frequent rest periods for the next 24 hours.   Walking will help get rid of the air and reduce the bloated feeling in your belly (abdomen).   No driving for 24 hours (because of the medicine (anesthesia) used during the test).   You may shower.   Do not sign any important legal documents or operate any machinery for 24 hours (because of the anesthesia used during the test).    NUTRITION  Drink plenty of fluids.   You may resume your normal diet as instructed by your doctor.   Begin with a light meal and progress to your normal diet. Heavy or fried foods are harder to digest and may make you feel sick to your stomach (nauseated).   Avoid  alcoholic beverages for 24 hours or as instructed.    MEDICATIONS  You may resume your normal medications.   WHAT YOU CAN EXPECT TODAY  Some feelings of bloating in the abdomen.   Passage of more gas than usual.   Spotting of blood in your stool or on the toilet paper  .  IF YOU HAD POLYPS REMOVED DURING THE COLONOSCOPY:  Eat a soft diet IF YOU HAVE NAUSEA, BLOATING, ABDOMINAL PAIN, OR VOMITING.    FINDING OUT THE RESULTS OF YOUR TEST Not all test results are available during your visit. DR. Oneida Alar WILL CALL YOU WITHIN 14 DAYS OF YOUR PROCEDUE WITH YOUR RESULTS. Do not assume everything is normal if you have not heard from DR. Chelsey Redondo, CALL HER OFFICE AT [phone].  SEEK IMMEDIATE MEDICAL ATTENTION AND CALL THE OFFICE: [phone] IF:  You have more than a spotting of blood in your stool.   Your belly is swollen (abdominal distention).   You are nauseated or vomiting.   You have a temperature over 101F.   You have abdominal pain or discomfort that is severe or gets worse throughout the day.  High-Fiber Diet A high-fiber diet changes your normal diet to include more whole grains, legumes, fruits, and vegetables. Changes in the diet involve replacing refined carbohydrates with unrefined foods. The calorie level of the diet is essentially unchanged. The Dietary Reference Intake (recommended amount) for adult males is 38 grams per day. For adult females, it is 25 grams per  day. Pregnant and lactating women should consume 28 grams of fiber per day. Fiber is the intact part of a plant that is not broken down during digestion. Functional fiber is fiber that has been isolated from the plant to provide a beneficial effect in the body. PURPOSE  Increase stool bulk.   Ease and regulate bowel movements.   Lower cholesterol.   REDUCE RISK OF COLON CANCER  INDICATIONS THAT YOU NEED MORE FIBER  Constipation and hemorrhoids.   Uncomplicated diverticulosis (intestine  condition) and irritable bowel syndrome.   Weight management.   As a protective measure against hardening of the arteries (atherosclerosis), diabetes, and cancer.   GUIDELINES FOR INCREASING FIBER IN THE DIET  Start adding fiber to the diet slowly. A gradual increase of about 5 more grams (2 slices of whole-wheat bread, 2 servings of most fruits or vegetables, or 1 bowl of high-fiber cereal) per day is best. Too rapid an increase in fiber may result in constipation, flatulence, and bloating.   Drink enough water and fluids to keep your urine clear or pale yellow. Water, juice, or caffeine-free drinks are recommended. Not drinking enough fluid may cause constipation.   Eat a variety of high-fiber foods rather than one type of fiber.   Try to increase your intake of fiber through using high-fiber foods rather than fiber pills or supplements that contain small amounts of fiber.   The goal is to change the types of food eaten. Do not supplement your present diet with high-fiber foods, but replace foods in your present diet.   INCLUDE A VARIETY OF FIBER SOURCES  Replace refined and processed grains with whole grains, canned fruits with fresh fruits, and incorporate other fiber sources. White rice, white breads, and most bakery goods contain little or no fiber.   Brown whole-grain rice, buckwheat oats, and many fruits and vegetables are all good sources of fiber. These include: broccoli, Brussels sprouts, cabbage, cauliflower, beets, sweet potatoes, white potatoes (skin on), carrots, tomatoes, eggplant, squash, berries, fresh fruits, and dried fruits.   Cereals appear to be the richest source of fiber. Cereal fiber is found in whole grains and bran. Bran is the fiber-rich outer coat of cereal grain, which is largely removed in refining. In whole-grain cereals, the bran remains. In breakfast cereals, the largest amount of fiber is found in those with "bran" in their names. The fiber content is  sometimes indicated on the label.   You may need to include additional fruits and vegetables each day.   In baking, for 1 cup white flour, you may use the following substitutions:   1 cup whole-wheat flour minus 2 tablespoons.   1/2 cup white flour plus 1/2 cup whole-wheat flour.   Polyps, Colon  A polyp is extra tissue that grows inside your body. Colon polyps grow in the large intestine. The large intestine, also called the colon, is part of your digestive system. It is a long, hollow tube at the end of your digestive tract where your body makes and stores stool. Most polyps are not dangerous. They are benign. This means they are not cancerous. But over time, some types of polyps can turn into cancer. Polyps that are smaller than a pea are usually not harmful. But larger polyps could someday become or may already be cancerous. To be safe, doctors remove all polyps and test them.   PREVENTION There is not one sure way to prevent polyps. You might be able to lower your risk of getting them  if you:  Eat more fruits and vegetables and less fatty food.   Do not smoke.   Avoid alcohol.   Exercise every day.   Lose weight if you are overweight.   Eating more calcium and folate can also lower your risk of getting polyps. Some foods that are rich in calcium are milk, cheese, and broccoli. Some foods that are rich in folate are chickpeas, kidney beans, and spinach.    Diverticulosis Diverticulosis is a common condition that develops when small pouches (diverticula) form in the wall of the colon. The risk of diverticulosis increases with age. It happens more often in people who eat a low-fiber diet. Most individuals with diverticulosis have no symptoms. Those individuals with symptoms usually experience belly (abdominal) pain, constipation, or loose stools (diarrhea).  HOME CARE INSTRUCTIONS  Increase the amount of fiber in your diet as directed by your caregiver or dietician. This may  reduce symptoms of diverticulosis.   Drink at least 6 to 8 glasses of water each day to prevent constipation.   Try not to strain when you have a bowel movement.   Avoiding nuts and seeds to prevent complications is NOT NECESSARY.   FOODS HAVING HIGH FIBER CONTENT INCLUDE:  Fruits. Apple, peach, pear, tangerine, raisins, prunes.   Vegetables. Brussels sprouts, asparagus, broccoli, cabbage, carrot, cauliflower, romaine lettuce, spinach, summer squash, tomato, winter squash, zucchini.   Starchy Vegetables. Baked beans, kidney beans, lima beans, split peas, lentils, potatoes (with skin).   Grains. Whole wheat bread, brown rice, bran flake cereal, plain oatmeal, white rice, shredded wheat, bran muffins.   SEEK IMMEDIATE MEDICAL CARE IF:  You develop increasing pain or severe bloating.   You have an oral temperature above 101F.   You develop vomiting or bowel movements that are bloody or black.   Hemorrhoids Hemorrhoids are dilated (enlarged) veins around the rectum. Sometimes clots will form in the veins. This makes them swollen and painful. These are called thrombosed hemorrhoids. Causes of hemorrhoids include:  Constipation.   Straining to have a bowel movement.   HEAVY LIFTING   HOME CARE INSTRUCTIONS  Eat a well balanced diet and drink 6 to 8 glasses of water every day to avoid constipation. You may also use a bulk laxative.   Avoid straining to have bowel movements.   Keep anal area dry and clean.   Do not use a donut shaped pillow or sit on the toilet for long periods. This increases blood pooling and pain.   Move your bowels when your body has the urge; this will require less straining and will decrease pain and pressure.

## 2017-11-11 NOTE — Op Note (Signed)
[facility] Patient Name: Francis Hall Procedure Date: 11/11/2017 8:23 AM MRN: [id number] Date of Birth: 1956-03-29 Attending MD: Barney Drain MD, MD CSN: [id number] Age: 62 Admit Type: Outpatient Procedure:                Colonoscopy WITH COLD SNARE/SNARE CAUTERY                            POLYPECTOMY Indications:              Screening in patient at increased risk: Family                            history of 1st-degree relative with colorectal                            cancer before age 69 years(FATHER IN HIS 75s) Providers:                Barney Drain MD, MD, Lurline Del, RN, Nelma Rothman,                            Technician Referring MD:             Halford Chessman MD Medicines:                Meperidine 75 mg IV, Midazolam 4 mg IV Complications:            No immediate complications. Estimated Blood Loss:     Estimated blood loss was minimal. Procedure:                Pre-Anesthesia Assessment:                           - Prior to the procedure, a History and Physical                            was performed, and patient medications and                            allergies were reviewed. The patient's tolerance of                            previous anesthesia was also reviewed. The risks                            and benefits of the procedure and the sedation                            options and risks were discussed with the patient.                            All questions were answered, and informed consent                            was obtained. Prior Anticoagulants: The patient has  taken aspirin, last dose was 2 days prior to                            procedure. ASA Grade Assessment: II - A patient                            with mild systemic disease. After reviewing the                            risks and benefits, the patient was deemed in                            satisfactory condition to undergo the procedure.         After obtaining informed consent, the colonoscope                            was passed under direct vision. Throughout the                            procedure, the patient's blood pressure, pulse, and                            oxygen saturations were monitored continuously. The                            EC-[id number] ([id number]) scope was introduced through                            the anus and advanced to the the cecum, identified                            by appendiceal orifice and ileocecal valve. The                            colonoscopy was somewhat difficult due to a                            tortuous colon. Successful completion of the                            procedure was aided by straightening and shortening                            the scope to obtain bowel loop reduction and                            COLOWRAP. The patient tolerated the procedure well.                            The quality of the bowel preparation was good. The                            ileocecal valve, appendiceal  orifice, and rectum                            were photographed. CPAP APPLIED PRIOR TO SEDATION. Scope In: 9:01:31 AM Scope Out: 9:24:52 AM Scope Withdrawal Time: 0 hours 21 minutes 39 seconds  Total Procedure Duration: 0 hours 23 minutes 21 seconds  Findings:      Three sessile polyps were found in the splenic flexure, transverse colon       and ascending colon. The polyps were 3 to 6 mm in size. These polyps       were removed with a cold snare. Resection and retrieval were complete.      A 6 mm polyp was found in the splenic flexure. The polyp was sessile.       The polyp was removed with a hot snare. Resection and retrieval were       complete.      A 3 mm polyp was found in the sigmoid colon. The polyp was sessile. The       polyp was removed with a cold snare. Polyp resection was incomplete, and       the resected tissue was not retrieved.      Multiple small and large-mouthed  diverticula were found in the       recto-sigmoid colon, sigmoid colon and descending colon.      The recto-sigmoid colon and sigmoid colon were mildly redundant.      External hemorrhoids were found during retroflexion. The hemorrhoids       were small. Impression:               - Three 3 to 6 mm polyps at the splenic flexure, in                            the transverse colon and in the ascending colon,                            removed with a cold snare. Resected and retrieved.                           - One 6 mm polyp at the splenic flexure, removed                            with a hot snare. Resected and retrieved.                           - One 3 mm polyp in the sigmoid colon, removed with                            a cold snare. Incomplete resection. Resected tissue                            not retrieved.                           - Diverticulosis in the recto-sigmoid colon, in the  sigmoid colon and in the descending colon.                           - Redundant LEFT colon.                           - External hemorrhoids. Moderate Sedation:      Moderate (conscious) sedation was administered by the endoscopy nurse       and supervised by the endoscopist. The following parameters were       monitored: oxygen saturation, heart rate, blood pressure, and response       to care. Total physician intraservice time was 29 minutes. Recommendation:           - Repeat colonoscopy in 3 years for surveillance.                           - High fiber diet. LOSE WEIGHT TO BMI < 30.                           - Continue present medications.                           - Await pathology results.                           - Patient has a contact number available for                            emergencies. The signs and symptoms of potential                            delayed complications were discussed with the                            patient. Return to normal  activities tomorrow.                            Written discharge instructions were provided to the                            patient. Procedure Code(s):        --- Professional ---                           [phone], Colonoscopy, flexible; with removal of                            tumor(s), polyp(s), or other lesion(s) by snare                            technique                           G0500, Moderate sedation services provided by the  same physician or other qualified health care                            professional performing a gastrointestinal                            endoscopic service that sedation supports,                            requiring the presence of an independent trained                            observer to assist in the monitoring of the                            patient's level of consciousness and physiological                            status; initial 15 minutes of intra-service time;                            patient age 43 years or older (additional time may                            be reported with [phone], as appropriate)                           [phone], Moderate sedation services provided by the                            same physician or other qualified health care                            professional performing the diagnostic or                            therapeutic service that the sedation supports,                            requiring the presence of an independent trained                            observer to assist in the monitoring of the                            patient's level of consciousness and physiological                            status; each additional 15 minutes intraservice                            time (List separately in addition to code for  primary service) Diagnosis Code(s):        --- Professional ---                           Z80.0, Family history of malignant  neoplasm of                            digestive organs                           D12.3, Benign neoplasm of transverse colon (hepatic                            flexure or splenic flexure)                           D12.2, Benign neoplasm of ascending colon                           D12.5, Benign neoplasm of sigmoid colon                           K64.4, Residual hemorrhoidal skin tags                           K57.30, Diverticulosis of large intestine without                            perforation or abscess without bleeding                           Q43.8, Other specified congenital malformations of                            intestine CPT copyright 2017 [employer]. All rights reserved. The codes documented in this report are preliminary and upon coder review may  be revised to meet current compliance requirements. Barney Drain, MD Barney Drain MD, MD 11/11/2017 9:35:06 AM This report has been signed electronically. Number of Addenda: 0

## 2017-11-11 NOTE — H&P (Signed)
Primary Care Physician:  Sharilyn Sites, MD Primary Gastroenterologist:  Dr. Oneida Alar  Pre-Procedure History & Physical: HPI:  Francis Hall is a 62 y.o. male here for [place].  Past Medical History:  Diagnosis Date  . Atrial fibrillation ([place])   . CAD (coronary artery disease)    [facility]  . CHF (congestive heart failure) ([place]) 11/08/2010   2D Echo EF=>55%  . Diabetes mellitus    insulin  . Diverticulosis   . Dyspnea   . Family hx of colon cancer   . GERD (gastroesophageal reflux disease)   . HTN (hypertension)   . Hyperlipemia   . Hyperplastic colon polyp 07/16/07   30cm   . Myocardial infarction ([place]) 12/2012  . OSA (obstructive sleep apnea) 12/21/2007   sleep study perform REM 63mns REM and NREM was 95.0%, AHI of 12.31hr and RDI of 12.4hr    Past Surgical History:  Procedure Laterality Date  . CARDIAC CATHETERIZATION  2006 2011   2 stents  . CATARACT EXTRACTION W/PHACO Left 12/03/2013   Procedure: CATARACT EXTRACTION PHACO AND INTRAOCULAR LENS PLACEMENT (IOC);  Surgeon: KTonny Branch MD;  Location: [facility];  Service: Ophthalmology;  Laterality: Left;  CDE:7.07  . CATARACT EXTRACTION W/PHACO Right 12/14/2013   Procedure: CATARACT EXTRACTION PHACO AND INTRAOCULAR LENS PLACEMENT (IOC);  Surgeon: KTonny Branch MD;  Location: [facility];  Service: Ophthalmology;  Laterality: Right;  CDE 7.84   . COLONOSCOPY  07/2007   Dr JArnoldo Morale hyperplastic polyp 30cm  . COLONOSCOPY N/A 08/12/2012   Procedure: COLONOSCOPY;  Surgeon: SDanie Binder MD;  Location: [facility];  Service: Endoscopy;  Laterality: N/A;  8:30  . CORONARY ARTERY BYPASS GRAFT  06/2004   x2 performed for 40% left main stenosis, 80% proximal LAD stenosis of the first diagonal artery, and 50% left circumfles artery priximal stenosis.LIMA to LAD and free radial artery to circumflex coronary artery  . LEFT HEART CATHETERIZATION WITH CORONARY/GRAFT ANGIOGRAM  12/29/2012   Procedure: LEFT HEART CATHETERIZATION WITH  CBeatrix Fetters  Surgeon: DLeonie Man MD;  [facility];  Service: Cardiovascular;;  . TONSILLECTOMY  1962    Prior to Admission medications   Medication Sig Start Date End Date Taking? Authorizing Provider  aspirin 81 MG EC tablet Take 81 mg by mouth daily.     Yes [provider]  carvedilol (COREG) 12.5 MG tablet Take 1 tablet (12.5 mg total) by mouth 2 (two) times daily with a meal. 01/20/16  Yes Croitoru, Mihai, MD  Cholecalciferol (VITAMIN D3) 1000 UNITS CAPS Take 2,000 Units by mouth daily.    Yes [provider]  diltiazem (CARDIZEM CD) 120 MG 24 hr capsule Take 1 capsule (120 mg total) by mouth daily. 08/26/17  Yes Croitoru, Mihai, MD  insulin aspart (NOVOLOG FLEXPEN) 100 UNIT/ML FlexPen INJECT 8 TO 14 UNITS  SUBCUTANEOUSLY 3 TIMES  DAILY WITH MEALS Patient taking differently: Inject 10-12 Units into the skin 3 (three) times daily with meals. Per sliding scale 06/10/17  Yes Nida, GMarella Chimes MD  Insulin Glargine (LANTUS SOLOSTAR) 100 UNIT/ML Solostar Pen INJECT 60 UNITS  SUBCUTANEOUSLY  UNITS AT BEDTIME 09/30/17  Yes Nida, GMarella Chimes MD  lisinopril (PRINIVIL,ZESTRIL) 20 MG tablet TAKE 1 TABLET BY MOUTH TWICE A DAY 11/06/17  Yes Nida, GMarella Chimes MD  metFORMIN (GLUCOPHAGE) 1000 MG tablet TAKE 1 TABLET BY MOUTH TWO TIMES DAILY WITH A MEAL 09/09/17  Yes Nida, GMarella Chimes MD  Multiple Vitamin (MULTIVITAMIN) capsule Take 1 capsule by mouth  daily.     Yes [provider]  pantoprazole (PROTONIX) 40 MG tablet Take 40 mg by mouth daily.   Yes [provider]  vitamin C (ASCORBIC ACID) 500 MG tablet Take 500 mg by mouth daily.   Yes [provider]  acetaminophen (TYLENOL) 325 MG tablet Take 650 mg by mouth every 6 (six) hours as needed for moderate pain or headache.    [provider]  atorvastatin (LIPITOR) 20 MG tablet TAKE 1 TABLET BY MOUTH EVERYDAY AT BEDTIME 10/07/17   Nida, Marella Chimes, MD  Blood  Glucose Monitoring Suppl (CONTOUR NEXT MONITOR) w/Device KIT 1 each by Does not apply route 4 (four) times daily. Patient not taking: Reported on 11/04/2017 06/11/17   Cassandria Anger, MD  Continuous Blood Gluc Sensor (FREESTYLE LIBRE 14 DAY SENSOR) MISC Inject 1 each into the skin every 14 (fourteen) days. Use as directed. Patient not taking: Reported on 11/04/2017 09/30/17   Cassandria Anger, MD  glucose blood (CONTOUR NEXT TEST) test strip Use as instructed 4 x daily. E11.65 Patient not taking: Reported on 11/04/2017 06/11/17   Cassandria Anger, MD  hydrochlorothiazide (MICROZIDE) 12.5 MG capsule TAKE 1 CAPSULE BY MOUTH AS  NEEDED. Patient taking differently: Take 12.5 mg by mouth daily as needed (for fluid).  09/18/17   Croitoru, Mihai, MD  nitroGLYCERIN (NITROSTAT) 0.4 MG SL tablet PLACE 1 TABLET UNDER TONGUE IF NEEDED FOR CHEST PAIN.[name] OF 3 DOSES Patient taking differently: Place 0.4 mg under the tongue every 5 (five) minutes as needed for chest pain.  09/10/17   Erlene Quan, PA-C    Allergies as of 10/08/2017 - Review Complete 10/08/2017  Allergen Reaction Noted  . Crestor [rosuvastatin] Other (See Comments) 09/22/2012    Family History  Problem Relation Age of Onset  . Colon cancer Father        47's?  . Lung cancer Father   . Coronary artery disease Father 44       MI  . Arthritis Unknown   . Heart disease Unknown   . Diabetes Unknown     Social History   Socioeconomic History  . Marital status: Married    Spouse name: Not on file  . Number of children: 2  . Years of education: 52  . Highest education level: Not on file  Occupational History  . Occupation: disabled Dealer   Social Needs  . Financial resource strain: Not on file  . Food insecurity:    Worry: Not on file    Inability: Not on file  . Transportation needs:    Medical: Not on file    Non-medical: Not on file  Tobacco Use  . Smoking status: Never Smoker  . Smokeless tobacco: Never Used   Substance and Sexual Activity  . Alcohol use: Yes    Comment: social-2 beers/wk  . Drug use: No  . Sexual activity: Yes    Birth control/protection: None  Lifestyle  . Physical activity:    Days per week: Not on file    Minutes per session: Not on file  . Stress: Not on file  Relationships  . Social connections:    Talks on phone: Not on file    Gets together: Not on file    Attends religious service: Not on file    Active member of club or organization: Not on file    Attends meetings of clubs or organizations: Not on file    Relationship status: Not on file  .  Intimate partner violence:    Fear of current or ex partner: Not on file    Emotionally abused: Not on file    Physically abused: Not on file    Forced sexual activity: Not on file  Other Topics Concern  . Not on file  Social History Narrative  . Not on file    Review of Systems: See HPI, otherwise negative ROS   Physical Exam: BP (!) 166/72   Pulse 72   Temp 97.7 F (36.5 C) (Oral)   Resp 15   Ht _0  (1.727 m)   Wt 200 lb (90.7 kg)   SpO2 97%   BMI 30.41 kg/m  General:   Alert,  pleasant and cooperative in NAD Head:  Normocephalic and atraumatic. Neck:  Supple; Lungs:  Clear throughout to auscultation.    Heart:  Regular rate and rhythm. Abdomen:  Soft, nontender and nondistended. Normal bowel sounds, without guarding, and without rebound.   Neurologic:  Alert and  oriented x4;  grossly normal neurologically.  Impression/Plan:     COLON CANCER SCREENING.  PLAN: 1. TCS TODAY. DISCUSSED PROCEDURE, BENEFITS, & RISKS: < 1% chance of medication reaction, bleeding, perforation, or rupture of spleen/liver.

## 2017-11-15 ENCOUNTER — Telehealth: Payer: Self-pay | Admitting: Gastroenterology

## 2017-11-15 NOTE — Telephone Encounter (Signed)
FOUR SIMPLE ADENOMAS REMOVED.    DRINK WATER TO KEEP YOUR URINE LIGHT YELLOW.  CONTINUE YOUR WEIGHT LOSS EFFORTS. A WEIGHT OF 195 LBS OR LESS  WILL GET YOUR BODY MASS INDEX(BMI) UNDER 30.  FOLLOW A HIGH FIBER DIET. AVOID ITEMS THAT CAUSE BLOATING.   USE PREPARATION H FOUR TIMES  A DAY IF NEEDED TO RELIEVE RECTAL PAIN/PRESSURE/BLEEDING.  Next colonoscopy in 3 years.

## 2017-11-18 ENCOUNTER — Encounter (HOSPITAL_COMMUNITY): Payer: Self-pay | Admitting: Gastroenterology

## 2017-11-18 NOTE — Telephone Encounter (Signed)
Pt notified of results

## 2017-11-18 NOTE — Telephone Encounter (Signed)
ON RECALL  °

## 2017-12-04 ENCOUNTER — Other Ambulatory Visit: Payer: Self-pay | Admitting: "Endocrinology

## 2017-12-24 ENCOUNTER — Other Ambulatory Visit: Payer: Self-pay | Admitting: "Endocrinology

## 2017-12-25 LAB — T4, FREE: Free T4: 1.27 ng/dL (ref 0.82–1.77)

## 2017-12-25 LAB — COMPREHENSIVE METABOLIC PANEL
ALK PHOS: 72 IU/L (ref 39–117)
ALT: 21 IU/L (ref 0–44)
AST: 15 IU/L (ref 0–40)
Albumin/Globulin Ratio: 2.3 — ABNORMAL HIGH (ref 1.2–2.2)
Albumin: 4.3 g/dL (ref 3.6–4.8)
BUN/Creatinine Ratio: 16 (ref 10–24)
BUN: 15 mg/dL (ref 8–27)
Bilirubin Total: 0.3 mg/dL (ref 0.0–1.2)
CO2: 21 mmol/L (ref 20–29)
Calcium: 9.4 mg/dL (ref 8.6–10.2)
Chloride: 104 mmol/L (ref 96–106)
Creatinine, Ser: 0.91 mg/dL (ref 0.76–1.27)
GFR calc non Af Amer: 91 mL/min/{1.73_m2} (ref >59)
GFR, EST AFRICAN AMERICAN: 106 mL/min/{1.73_m2} (ref >59)
Globulin, Total: 1.9 g/dL (ref 1.5–4.5)
Glucose: 172 mg/dL — ABNORMAL HIGH (ref 65–99)
Potassium: 4.4 mmol/L (ref 3.5–5.2)
Sodium: 141 mmol/L (ref 134–144)
TOTAL PROTEIN: 6.2 g/dL (ref 6.0–8.5)

## 2017-12-25 LAB — TSH: TSH: 2.81 u[IU]/mL (ref 0.450–4.500)

## 2017-12-25 LAB — LIPID PANEL W/O CHOL/HDL RATIO
CHOLESTEROL TOTAL: 159 mg/dL (ref 100–199)
HDL: 34 mg/dL — ABNORMAL LOW (ref >39)
LDL Calculated: 74 mg/dL (ref 0–99)
Triglycerides: 254 mg/dL — ABNORMAL HIGH (ref 0–149)
VLDL Cholesterol Cal: 51 mg/dL — ABNORMAL HIGH (ref 5–40)

## 2017-12-25 LAB — MICROALBUMIN / CREATININE URINE RATIO
Creatinine, Urine: 100.1 mg/dL
Microalb/Creat Ratio: 190.3 mg/g creat — ABNORMAL HIGH (ref 0.0–30.0)
Microalbumin, Urine: 190.5 ug/mL

## 2017-12-25 LAB — HGB A1C W/O EAG: Hgb A1c MFr Bld: 6.9 % — ABNORMAL HIGH (ref 4.8–5.6)

## 2017-12-31 ENCOUNTER — Encounter: Payer: Self-pay | Admitting: "Endocrinology

## 2017-12-31 ENCOUNTER — Ambulatory Visit (INDEPENDENT_AMBULATORY_CARE_PROVIDER_SITE_OTHER): Payer: BLUE CROSS/BLUE SHIELD | Admitting: "Endocrinology

## 2017-12-31 VITALS — BP 138/77 | HR 68 | Ht 68.0 in | Wt 193.0 lb

## 2017-12-31 DIAGNOSIS — I10 Essential (primary) hypertension: Secondary | ICD-10-CM | POA: Diagnosis not present

## 2017-12-31 DIAGNOSIS — E782 Mixed hyperlipidemia: Secondary | ICD-10-CM

## 2017-12-31 DIAGNOSIS — E1159 Type 2 diabetes mellitus with other circulatory complications: Secondary | ICD-10-CM

## 2017-12-31 MED ORDER — METFORMIN HCL 1000 MG PO TABS: 1000.0000 mg | ORAL_TABLET | Freq: Two times a day (BID) | ORAL | 0 refills | Status: DC

## 2017-12-31 MED ORDER — INSULIN ASPART 100 UNIT/ML FLEXPEN: 10.0000 [IU] | PEN_INJECTOR | Freq: Three times a day (TID) | SUBCUTANEOUS | 2 refills | Status: DC

## 2017-12-31 NOTE — Patient Instructions (Signed)

## 2017-12-31 NOTE — Progress Notes (Signed)
Endocrinology follow-up note  Subjective:    Patient ID: Francis Hall, male    DOB: [birth year],    Past Medical History:  Diagnosis Date  . Atrial fibrillation ([place])   . CAD (coronary artery disease)    [place]  . CHF (congestive heart failure) ([place]) 11/08/2010   2D Echo EF=>55%  . Diabetes mellitus    insulin  . Diverticulosis   . Dyspnea   . Family hx of colon cancer   . GERD (gastroesophageal reflux disease)   . HTN (hypertension)   . Hyperlipemia   . Hyperplastic colon polyp 07/16/07   30cm   . Myocardial infarction ([place]) 12/2012  . OSA (obstructive sleep apnea) 12/21/2007   sleep study perform REM 5mins REM and NREM was 95.0%, AHI of 12.31hr and RDI of 12.4hr   Past Surgical History:  Procedure Laterality Date  . CARDIAC CATHETERIZATION  2006 2011   2 stents  . CATARACT EXTRACTION W/PHACO Left 12/03/2013   Procedure: CATARACT EXTRACTION PHACO AND INTRAOCULAR LENS PLACEMENT (IOC);  Surgeon: Tonny Branch, MD;  Location: [facility];  Service: Ophthalmology;  Laterality: Left;  CDE:7.07  . CATARACT EXTRACTION W/PHACO Right 12/14/2013   Procedure: CATARACT EXTRACTION PHACO AND INTRAOCULAR LENS PLACEMENT (IOC);  Surgeon: Tonny Branch, MD;  Location: [facility];  Service: Ophthalmology;  Laterality: Right;  CDE 7.84   . COLONOSCOPY  07/2007   Dr Arnoldo Morale: hyperplastic polyp 30cm  . COLONOSCOPY N/A 08/12/2012   Procedure: COLONOSCOPY;  Surgeon: Danie Binder, MD;  Location: [facility];  Service: Endoscopy;  Laterality: N/A;  8:30  . COLONOSCOPY N/A 11/11/2017   Procedure: COLONOSCOPY;  Surgeon: Danie Binder, MD;  Location: [facility];  Service: Endoscopy;  Laterality: N/A;  8:30am  . CORONARY ARTERY BYPASS GRAFT  06/2004   x2 performed for 40% left main stenosis, 80% proximal LAD stenosis of the first diagonal artery, and 50% left circumfles artery priximal stenosis.LIMA to LAD and free radial artery to circumflex coronary artery  . LEFT HEART CATHETERIZATION WITH CORONARY/GRAFT  ANGIOGRAM  12/29/2012   Procedure: LEFT HEART CATHETERIZATION WITH Beatrix Fetters;  Surgeon: Leonie Man, MD;  Location: [facility] CATH LAB;  Service: Cardiovascular;;  . POLYPECTOMY  11/11/2017   Procedure: POLYPECTOMY;  Surgeon: Danie Binder, MD;  Location: [facility];  Service: Endoscopy;;  ascending colon, transverse, splenic flexure, sigmoid  . TONSILLECTOMY  1962   Social History   Socioeconomic History  . Marital status: Married    Spouse name: Not on file  . Number of children: 2  . Years of education: 19  . Highest education level: Not on file  Occupational History  . Occupation: disabled Dealer   Social Needs  . Financial resource strain: Not on file  . Food insecurity:    Worry: Not on file    Inability: Not on file  . Transportation needs:    Medical: Not on file    Non-medical: Not on file  Tobacco Use  . Smoking status: Never Smoker  . Smokeless tobacco: Never Used  Substance and Sexual Activity  . Alcohol use: Yes    Comment: social-2 beers/wk  . Drug use: No  . Sexual activity: Yes    Birth control/protection: None  Lifestyle  . Physical activity:    Days per week: Not on file    Minutes per session: Not on file  . Stress: Not on file  Relationships  . Social connections:    Talks on phone: Not on file  Gets together: Not on file    Attends religious service: Not on file    Active member of club or organization: Not on file    Attends meetings of clubs or organizations: Not on file    Relationship status: Not on file  Other Topics Concern  . Not on file  Social History Narrative  . Not on file   Outpatient Encounter Medications as of 12/31/2017  Medication Sig  . acetaminophen (TYLENOL) 325 MG tablet Take 650 mg by mouth every 6 (six) hours as needed for moderate pain or headache.  [name] aspirin 81 MG EC tablet Take 81 mg by mouth daily.    [name] atorvastatin (LIPITOR) 20 MG tablet TAKE 1 TABLET BY MOUTH EVERYDAY AT BEDTIME  . carvedilol  (COREG) 12.5 MG tablet Take 1 tablet (12.5 mg total) by mouth 2 (two) times daily with a meal.  . Cholecalciferol (VITAMIN D3) 1000 UNITS CAPS Take 2,000 Units by mouth daily.   [name] diltiazem (CARDIZEM CD) 120 MG 24 hr capsule Take 1 capsule (120 mg total) by mouth daily.  . hydrochlorothiazide (MICROZIDE) 12.5 MG capsule TAKE 1 CAPSULE BY MOUTH AS  NEEDED. (Patient taking differently: Take 12.5 mg by mouth daily as needed (for fluid). )  . insulin aspart (NOVOLOG FLEXPEN) 100 UNIT/ML FlexPen Inject 10-16 Units into the skin 3 (three) times daily with meals. Per sliding scale  . Insulin Glargine (LANTUS SOLOSTAR) 100 UNIT/ML Solostar Pen INJECT 60 UNITS  SUBCUTANEOUSLY  UNITS AT BEDTIME  . lisinopril (PRINIVIL,ZESTRIL) 20 MG tablet TAKE 1 TABLET BY MOUTH TWICE A DAY  . metFORMIN (GLUCOPHAGE) 1000 MG tablet Take 1 tablet (1,000 mg total) by mouth 2 (two) times daily with a meal.  . Multiple Vitamin (MULTIVITAMIN) capsule Take 1 capsule by mouth daily.    . nitroGLYCERIN (NITROSTAT) 0.4 MG SL tablet PLACE 1 TABLET UNDER TONGUE IF NEEDED FOR CHEST PAIN.[name] OF 3 DOSES (Patient taking differently: Place 0.4 mg under the tongue every 5 (five) minutes as needed for chest pain. )  . pantoprazole (PROTONIX) 40 MG tablet Take 40 mg by mouth daily.  . vitamin C (ASCORBIC ACID) 500 MG tablet Take 500 mg by mouth daily.  . [DISCONTINUED] insulin aspart (NOVOLOG FLEXPEN) 100 UNIT/ML FlexPen INJECT 8 TO 14 UNITS  SUBCUTANEOUSLY 3 TIMES  DAILY WITH MEALS (Patient taking differently: Inject 10-12 Units into the skin 3 (three) times daily with meals. Per sliding scale)  . [DISCONTINUED] metFORMIN (GLUCOPHAGE) 1000 MG tablet TAKE 1 TABLET BY MOUTH TWO TIMES DAILY WITH A MEAL  . [DISCONTINUED] metFORMIN (GLUCOPHAGE) 1000 MG tablet TAKE 1 TABLET BY MOUTH TWO TIMES DAILY WITH A MEAL   No facility-administered encounter medications on file as of 12/31/2017.    ALLERGIES: Allergies  Allergen Reactions  . Crestor  [Rosuvastatin] Other (See Comments)    myalgia   VACCINATION STATUS:  There is no immunization history on file for this patient.  Diabetes  He presents for his follow-up diabetic visit. He has type 2 diabetes mellitus. Onset time: He was diagnosed at approximate age of 56 years. His disease course has been improving. There are no hypoglycemic associated symptoms. Pertinent negatives for hypoglycemia include no confusion, headaches, pallor or seizures. There are no diabetic associated symptoms. Pertinent negatives for diabetes include no chest pain, no fatigue, no polydipsia, no polyphagia, no polyuria and no weakness. There are no hypoglycemic complications. Symptoms are improving. Diabetic complications include heart disease and peripheral neuropathy. Risk factors for coronary artery disease include dyslipidemia,  diabetes mellitus, hypertension and male sex. Current diabetic treatment includes intensive insulin program. His weight is stable. He is following a generally unhealthy diet. He has had a previous visit with a dietitian. He participates in exercise intermittently. His home blood glucose trend is decreasing steadily. His breakfast blood glucose range is generally 140-180 mg/dl. His lunch blood glucose range is generally 140-180 mg/dl. His dinner blood glucose range is generally 140-180 mg/dl. His bedtime blood glucose range is generally 140-180 mg/dl. His overall blood glucose range is 140-180 mg/dl. An ACE inhibitor/angiotensin II receptor blocker is being taken. Eye exam is current.  Hypertension  This is a chronic problem. The current episode started more than 1 year ago. The problem has been waxing and waning since onset. Pertinent negatives include no chest pain, headaches, neck pain, palpitations or shortness of breath. Risk factors for coronary artery disease include diabetes mellitus, dyslipidemia, male gender and sedentary lifestyle. Past treatments include ACE inhibitors and beta  blockers. Hypertensive end-organ damage includes CAD/MI.  Hyperlipidemia  This is a chronic problem. The current episode started more than 1 year ago. The problem is controlled. Recent lipid tests were reviewed and are high. Exacerbating diseases include diabetes and obesity. Pertinent negatives include no chest pain, myalgias or shortness of breath. Current antihyperlipidemic treatment includes statins. Risk factors for coronary artery disease include diabetes mellitus, dyslipidemia, hypertension, male sex, a sedentary lifestyle, obesity and family history.    Review of Systems  Constitutional: Negative for fatigue and unexpected weight change.  HENT: Negative for dental problem, mouth sores and trouble swallowing.   Eyes: Negative for visual disturbance.  Respiratory: Negative for cough, choking, chest tightness, shortness of breath and wheezing.   Cardiovascular: Negative for chest pain, palpitations and leg swelling.  Gastrointestinal: Negative for abdominal distention, abdominal pain, constipation, diarrhea, nausea and vomiting.  Endocrine: Negative for polydipsia, polyphagia and polyuria.  Genitourinary: Negative for dysuria, flank pain, hematuria and urgency.  Musculoskeletal: Negative for back pain, gait problem, myalgias and neck pain.  Skin: Negative for pallor, rash and wound.  Neurological: Negative for seizures, syncope, weakness, numbness and headaches.  Psychiatric/Behavioral: Negative.  Negative for confusion and dysphoric mood.    Objective:    BP 138/77   Pulse 68   Ht 5\' 8"  (1.727 m)   Wt 193 lb (87.5 kg)   SpO2 96%   BMI 29.35 kg/m   Wt Readings from Last 3 Encounters:  12/31/17 193 lb (87.5 kg)  11/11/17 200 lb (90.7 kg)  10/08/17 201 lb 3.2 oz (91.3 kg)    Physical Exam  Constitutional: He is oriented to person, place, and time. He appears well-developed. He is cooperative. No distress.  HENT:  Head: Normocephalic and atraumatic.  Eyes: EOM are normal.   Neck: Normal range of motion. Neck supple. No tracheal deviation present. No thyromegaly present.  Cardiovascular: Normal rate, S1 normal and S2 normal. Exam reveals no gallop.  No murmur heard. Pulses:      Dorsalis pedis pulses are 1+ on the right side, and 1+ on the left side.       Posterior tibial pulses are 1+ on the right side, and 1+ on the left side.  Pulmonary/Chest: Effort normal. No respiratory distress. He has no wheezes.  Abdominal: He exhibits no distension. There is no tenderness. There is no guarding and no CVA tenderness.  Musculoskeletal: He exhibits no edema.       Right shoulder: He exhibits no swelling and no deformity.  Neurological: He is alert  and oriented to person, place, and time. He has normal strength and normal reflexes. No cranial nerve deficit or sensory deficit. Gait normal.  Skin: Skin is warm and dry. No rash noted. No cyanosis. Nails show no clubbing.  Psychiatric: He has a normal mood and affect. His speech is normal. Judgment normal. Cognition and memory are normal.    Results for orders placed or performed in visit on 12/24/17  Comprehensive metabolic panel  Result Value Ref Range   Glucose 172 (H) 65 - 99 mg/dL   BUN 15 8 - 27 mg/dL   Creatinine, Ser 0.91 0.76 - 1.27 mg/dL   GFR calc non Af Amer 91 >59 mL/min/1.73   GFR calc Af Amer 106 >59 mL/min/1.73   BUN/Creatinine Ratio 16 10 - 24   Sodium 141 134 - 144 mmol/L   Potassium 4.4 3.5 - 5.2 mmol/L   Chloride 104 96 - 106 mmol/L   CO2 21 20 - 29 mmol/L   Calcium 9.4 8.6 - 10.2 mg/dL   Total Protein 6.2 6.0 - 8.5 g/dL   Albumin 4.3 3.6 - 4.8 g/dL   Globulin, Total 1.9 1.5 - 4.5 g/dL   Albumin/Globulin Ratio 2.3 (H) 1.2 - 2.2   Bilirubin Total 0.3 0.0 - 1.2 mg/dL   Alkaline Phosphatase 72 39 - 117 IU/L   AST 15 0 - 40 IU/L   ALT 21 0 - 44 IU/L  Lipid Panel w/o Chol/HDL Ratio  Result Value Ref Range   Cholesterol, Total 159 100 - 199 mg/dL   Triglycerides 254 (H) 0 - 149 mg/dL   HDL 34  (L) >39 mg/dL   VLDL Cholesterol Cal 51 (H) 5 - 40 mg/dL   LDL Calculated 74 0 - 99 mg/dL  Microalbumin / creatinine urine ratio  Result Value Ref Range   Creatinine, Urine 100.1 Not Estab. mg/dL   Microalbumin, Urine 190.5 Not Estab. ug/mL   Microalb/Creat Ratio 190.3 (H) 0.0 - 30.0 mg/g creat  Hgb A1c w/o eAG  Result Value Ref Range   Hgb A1c MFr Bld 6.9 (H) 4.8 - 5.6 %  T4, free  Result Value Ref Range   Free T4 1.27 0.82 - 1.77 ng/dL  TSH  Result Value Ref Range   TSH 2.810 0.450 - 4.500 uIU/mL   Diabetic Labs (most recent): Lab Results  Component Value Date   HGBA1C 6.9 (H) 12/24/2017   HGBA1C 7.4 (H) 09/23/2017   HGBA1C 8.1 (H) 06/18/2017   Lipid Panel     Component Value Date/Time   CHOL 159 12/24/2017 0952   TRIG 254 (H) 12/24/2017 0952   HDL 34 (L) 12/24/2017 0952   CHOLHDL 4.8 01/22/2017 1039   CHOLHDL 7.0 12/29/2012 0438   VLDL UNABLE TO CALCULATE IF TRIGLYCERIDE OVER 400 mg/dL 12/29/2012 0438   LDLCALC 74 12/24/2017 0952     Assessment & Plan:   1. Type 2 diabetes mellitus with vascular disease ([place])  His diabetes is  complicated by coronary artery disease status post CABG in 2006 and recent stent placement in 2014. Patient came with improving blood glucose profile, using his CGM device, A1c of 6.9% generally improving from 8.1% over the last 6 months.   - He admits to significant dietary indiscretion during the holiday season.   Glucose logs and insulin administration records pertaining to this visit,  to be scanned into patient's records.    -Recent labs reviewed. [name] remains at a high risk for more acute and chronic complications of  diabetes which include CAD, CVA, CKD, retinopathy, and neuropathy. These are all discussed in detail with the patient.  - I have re-counseled the patient on diet management and weight loss  by adopting a carbohydrate restricted / protein rich  Diet.  - Patient is advised to stick to a routine mealtimes to eat 3  meals  a day and avoid unnecessary snacks ( to snack only to correct hypoglycemia).  -  Suggestion is made for him to avoid simple carbohydrates  from his diet including Cakes, Sweet Desserts / Pastries, Ice Cream, Soda (diet and regular), Sweet Tea, Candies, Chips, Cookies, Store Bought Juices, Alcohol in Excess of  1-2 drinks a day, Artificial Sweeteners, and "Sugar-free" Products. This will help patient to have stable blood glucose profile and potentially avoid unintended weight gain.   - I have approached patient with the following individualized plan to manage diabetes and patient agrees.  -  He'll continue to require basal/bolus insulin to maintain diabetes control to target.   -He is benefiting from the continuous glucose monitoring device , advised to wear it at all times. - I advised him to continue  Lantus 60 units nightly , continue NovoLog  10 units 3 times a day before meals  for pre-meal BG readings of 90-150mg /dl, plus patient specific correction dose of rapid acting insulin  for unexpected hyperglycemia above 150mg /dl, associated with strict monitoring of glucose  AC and HS. - Patient is warned not to take insulin without proper monitoring per orders. -Adjustment parameters are given for hypo and hyperglycemia in writing. -Patient is encouraged to call clinic for blood glucose levels less than 70 or above 300 mg /dl.  - I will continue metformin 1000 mg by mouth twice a day .  - Patient specific target  for A1c; LDL, HDL, Triglycerides, and  Waist Circumference were discussed in detail.  2) BP/HTN: His blood pressure is controlled to target.  He is advised to continue his current blood pressure medications including lisinopril 20 mg p.o. daily, beta blockers   3) Lipids/HPL: Uncontrolled with triglycerides at 309, aggressively  improving from 1700. I urged him to be consistent with his Lovaza 2 g by mouth twice a day, fenofibrate 145 mg by mouth daily, and  Lipitor 20 mg by mouth  daily at bedtime.  4)  Weight/Diet: CDE consult in progress, exercise, and carbohydrates information provided.  5) Chronic Care/Health Maintenance:  -Patient  Is on ACEI and Statin medications and encouraged to continue to follow up with Ophthalmology, Podiatrist at least yearly or according to recommendations, and advised to stay away from smoking. I have recommended yearly flu vaccine and pneumonia vaccination at least every 5 years; moderate intensity exercise for up to 150 minutes weekly; and  sleep for at least 7 hours a day.  I advised patient to maintain close follow up with his PCP for primary care needs.  - Time spent with the patient: 25 min, of which >50% was spent in reviewing his blood glucose logs , discussing his hypo- and hyper-glycemic episodes, reviewing his current and  previous labs and insulin doses and developing a plan to avoid hypo- and hyper-glycemia. Please refer to Patient Instructions for Blood Glucose Monitoring and Insulin/Medications Dosing Guide"  in media tab for additional information. [name] participated in the discussions, expressed understanding, and voiced agreement with the above plans.  All questions were answered to his satisfaction. he is encouraged to contact clinic should he have any questions or concerns prior to  his return visit.  Follow up plan: Return in about 4 months (around 05/03/2018) for meter, and logs.  Glade Lloyd, MD Phone: [phone]  Fax: [phone]  -  This note was partially dictated with voice recognition software. Similar sounding words can be transcribed inadequately or may not  be corrected upon review.  12/31/2017, 12:31 PM

## 2018-01-05 ENCOUNTER — Other Ambulatory Visit: Payer: Self-pay | Admitting: "Endocrinology

## 2018-02-05 DIAGNOSIS — I10 Essential (primary) hypertension: Secondary | ICD-10-CM | POA: Diagnosis not present

## 2018-02-05 DIAGNOSIS — E782 Mixed hyperlipidemia: Secondary | ICD-10-CM | POA: Diagnosis not present

## 2018-02-05 DIAGNOSIS — I2510 Atherosclerotic heart disease of native coronary artery without angina pectoris: Secondary | ICD-10-CM | POA: Diagnosis not present

## 2018-02-05 DIAGNOSIS — E118 Type 2 diabetes mellitus with unspecified complications: Secondary | ICD-10-CM | POA: Diagnosis not present

## 2018-02-05 DIAGNOSIS — Z23 Encounter for immunization: Secondary | ICD-10-CM | POA: Diagnosis not present

## 2018-02-06 ENCOUNTER — Other Ambulatory Visit: Payer: Self-pay | Admitting: "Endocrinology

## 2018-02-08 ENCOUNTER — Other Ambulatory Visit: Payer: Self-pay | Admitting: "Endocrinology

## 2018-02-10 ENCOUNTER — Other Ambulatory Visit: Payer: Self-pay | Admitting: "Endocrinology

## 2018-02-20 ENCOUNTER — Other Ambulatory Visit: Payer: Self-pay | Admitting: Cardiovascular Disease

## 2018-03-12 ENCOUNTER — Other Ambulatory Visit: Payer: Self-pay | Admitting: Cardiovascular Disease

## 2018-03-12 NOTE — Telephone Encounter (Signed)
°*  STAT* If patient is at the pharmacy, call can be transferred to refill team.   1. Which medications need to be refilled? (please list name of each medication and dose if known) Omega 3 Acid 1 mg  2. Which pharmacy/location (including street and city if local pharmacy) is medication to be sent to? [facility] [phone]  3. Do they need a 30 day or 90 day supply? 180 and refills

## 2018-03-13 DIAGNOSIS — E119 Type 2 diabetes mellitus without complications: Secondary | ICD-10-CM | POA: Diagnosis not present

## 2018-03-13 DIAGNOSIS — Z794 Long term (current) use of insulin: Secondary | ICD-10-CM | POA: Diagnosis not present

## 2018-03-13 DIAGNOSIS — H16223 Keratoconjunctivitis sicca, not specified as Sjogren's, bilateral: Secondary | ICD-10-CM | POA: Diagnosis not present

## 2018-03-13 DIAGNOSIS — Z7984 Long term (current) use of oral hypoglycemic drugs: Secondary | ICD-10-CM | POA: Diagnosis not present

## 2018-03-17 MED ORDER — ICOSAPENT ETHYL 1 G PO CAPS: 2.0000 g | ORAL_CAPSULE | Freq: Two times a day (BID) | ORAL | 5 refills | Status: DC

## 2018-03-17 NOTE — Telephone Encounter (Signed)
Let's try Vascepa 2 g BID - much better than generic omega-3, if insurance will cover.  We also have coupons which will cut the copay a lot MCr

## 2018-03-24 ENCOUNTER — Other Ambulatory Visit: Payer: Self-pay | Admitting: "Endocrinology

## 2018-03-26 ENCOUNTER — Other Ambulatory Visit: Payer: Self-pay | Admitting: "Endocrinology

## 2018-04-15 ENCOUNTER — Ambulatory Visit: Payer: Medicare Other | Admitting: Gastroenterology

## 2018-04-15 ENCOUNTER — Encounter: Payer: Self-pay | Admitting: Gastroenterology

## 2018-04-15 VITALS — BP 173/78 | HR 67 | Temp 97.0°F | Ht 67.0 in | Wt 202.2 lb

## 2018-04-15 DIAGNOSIS — K219 Gastro-esophageal reflux disease without esophagitis: Secondary | ICD-10-CM | POA: Diagnosis not present

## 2018-04-15 DIAGNOSIS — Z86010 Personal history of colonic polyps: Secondary | ICD-10-CM | POA: Insufficient documentation

## 2018-04-15 NOTE — Progress Notes (Signed)
CC'D TO PCP °

## 2018-04-15 NOTE — Assessment & Plan Note (Signed)
Uncomplicated GERD, controlled well with once daily PPI. No alarm features. Presently receiving refills through PCP. Discussed signs/symptoms that would warrant need for further evaluation. We will see him as needed.

## 2018-04-15 NOTE — Patient Instructions (Signed)
Your next colonoscopy will be in 3 years!  We will see you as needed. Continue Protonix once daily, 30 minutes before breakfast. Call us if any problems swallowing, weight loss unintentionally, nausea, vomiting, loss of appetite, abdominal pain.  It was a pleasure to see you today. I strive to create trusting relationships with patients to provide genuine, compassionate, and quality care. I value your feedback. If you receive a survey regarding your visit,  I greatly appreciate you taking time to fill this out.   Annitta Needs, PhD, ANP-BC [facility]

## 2018-04-15 NOTE — Progress Notes (Signed)
Referring Provider: Sharilyn Sites, MD Primary Care Physician:  Sharilyn Sites, MD Primary GI: Dr. Oneida Alar   Chief Complaint  Patient presents with  . Gastroesophageal Reflux    Saw PCP and started on Protonix and it has helped    HPI:   Francis Hall is a 62 y.o. male presenting today with recent colonoscopy earlier this year with multiple adenomas, due for surveillance in 2022. Family history of colon cancer in first degree relative (father in his 5s). Presents here in follow-up for GERD. He was started on Protonix earlier this year by PCP.  Denies abdominal pain, N/V, unintentional weight loss, GERD exacerbation, early satiety, dysphagia. Has no lower or upper GI complaints. Would like to continue with refills through PCP.     Past Medical History:  Diagnosis Date  . Atrial fibrillation ([place])   . CAD (coronary artery disease)    [facility]  . CHF (congestive heart failure) ([place]) 11/08/2010   2D Echo EF=>55%  . Diabetes mellitus    insulin  . Diverticulosis   . Dyspnea   . Family hx of colon cancer   . GERD (gastroesophageal reflux disease)   . HTN (hypertension)   . Hyperlipemia   . Hyperplastic colon polyp 07/16/07   30cm   . Myocardial infarction ([place]) 12/2012  . OSA (obstructive sleep apnea) 12/21/2007   sleep study perform REM 65mins REM and NREM was 95.0%, AHI of 12.31hr and RDI of 12.4hr    Past Surgical History:  Procedure Laterality Date  . CARDIAC CATHETERIZATION  2006 2011   2 stents  . CATARACT EXTRACTION W/PHACO Left 12/03/2013   Procedure: CATARACT EXTRACTION PHACO AND INTRAOCULAR LENS PLACEMENT (IOC);  Surgeon: Tonny Branch, MD;  Location: [facility];  Service: Ophthalmology;  Laterality: Left;  CDE:7.07  . CATARACT EXTRACTION W/PHACO Right 12/14/2013   Procedure: CATARACT EXTRACTION PHACO AND INTRAOCULAR LENS PLACEMENT (IOC);  Surgeon: Tonny Branch, MD;  Location: [facility];  Service: Ophthalmology;  Laterality: Right;  CDE 7.84   . COLONOSCOPY  07/2007   Dr  Arnoldo Morale: hyperplastic polyp 30cm  . COLONOSCOPY N/A 08/12/2012   Procedure: COLONOSCOPY;  Surgeon: Danie Binder, MD;  Location: [facility];  Service: Endoscopy;  Laterality: N/A;  8:30  . COLONOSCOPY N/A 11/11/2017   Dr. Oneida Alar: multiple tubular adenomas, next surveillance in 2022.   [name] CORONARY ARTERY BYPASS GRAFT  06/2004   x2 performed for 40% left main stenosis, 80% proximal LAD stenosis of the first diagonal artery, and 50% left circumfles artery priximal stenosis.LIMA to LAD and free radial artery to circumflex coronary artery  . LEFT HEART CATHETERIZATION WITH CORONARY/GRAFT ANGIOGRAM  12/29/2012   Procedure: LEFT HEART CATHETERIZATION WITH Beatrix Fetters;  Surgeon: Leonie Man, MD;  Location: [facility] CATH LAB;  Service: Cardiovascular;;  . POLYPECTOMY  11/11/2017   Procedure: POLYPECTOMY;  Surgeon: Danie Binder, MD;  Location: [facility];  Service: Endoscopy;;  ascending colon, transverse, splenic flexure, sigmoid  . TONSILLECTOMY  1962    Current Outpatient Medications  Medication Sig Dispense Refill  . acetaminophen (TYLENOL) 325 MG tablet Take 650 mg by mouth every 6 (six) hours as needed for moderate pain or headache.    [name] aspirin 81 MG EC tablet Take 81 mg by mouth daily.      [name] atorvastatin (LIPITOR) 20 MG tablet TAKE 1 TABLET BY MOUTH EVERYDAY AT BEDTIME 30 tablet 2  . carvedilol (COREG) 12.5 MG tablet Take 1 tablet (12.5 mg total) by mouth 2 (  two) times daily with a meal. 60 tablet 4  . Cholecalciferol (VITAMIN D3) 1000 UNITS CAPS Take 2,000 Units by mouth daily.     . Continuous Blood Gluc Sensor (FREESTYLE LIBRE 14 DAY SENSOR) MISC INJECT 1 EACH INTO THE SKIN EVERY 14 (FOURTEEN) DAYS. USE AS DIRECTED. 2 each 5  . Continuous Blood Gluc Sensor (FREESTYLE LIBRE 14 DAY SENSOR) MISC INJECT 1 EACH INTO THE SKIN EVERY 14 (FOURTEEN) DAYS. USE AS DIRECTED. 2 each 2  . Continuous Blood Gluc Sensor (FREESTYLE LIBRE 14 DAY SENSOR) MISC INJECT 1 EACH INTO THE SKIN EVERY 14  (FOURTEEN) DAYS. USE AS DIRECTED. 2 each 2  . diltiazem (CARDIZEM CD) 120 MG 24 hr capsule Take 1 capsule (120 mg total) by mouth daily. 90 capsule 1  . fenofibrate (TRICOR) 48 MG tablet TAKE 1 TABLET (48 MG TOTAL) BY MOUTH DAILY. 90 tablet 1  . hydrochlorothiazide (MICROZIDE) 12.5 MG capsule TAKE 1 CAPSULE BY MOUTH AS  NEEDED. (Patient taking differently: Take 12.5 mg by mouth daily as needed (for fluid). ) 90 capsule 3  . Icosapent Ethyl (VASCEPA) 1 g CAPS Take 2 capsules (2 g total) by mouth 2 (two) times daily. 60 capsule 5  . insulin aspart (NOVOLOG FLEXPEN) 100 UNIT/ML FlexPen Inject 10-16 Units into the skin 3 (three) times daily with meals. Per sliding scale 5 pen 2  . Insulin Glargine (LANTUS SOLOSTAR) 100 UNIT/ML Solostar Pen INJECT 60 UNITS SUBCUTANEOUSLY UNITS AT BEDTIME 15 mL 2  . lisinopril (PRINIVIL,ZESTRIL) 20 MG tablet TAKE 1 TABLET BY MOUTH TWICE A DAY 30 tablet 2  . metFORMIN (GLUCOPHAGE) 1000 MG tablet Take 1 tablet (1,000 mg total) by mouth 2 (two) times daily with a meal. 180 tablet 0  . Multiple Vitamin (MULTIVITAMIN) capsule Take 1 capsule by mouth daily.      . nitroGLYCERIN (NITROSTAT) 0.4 MG SL tablet PLACE 1 TABLET UNDER TONGUE IF NEEDED FOR CHEST PAIN.[name] OF 3 DOSES (Patient taking differently: Place 0.4 mg under the tongue every 5 (five) minutes as needed for chest pain. ) 25 tablet 5  . pantoprazole (PROTONIX) 40 MG tablet Take 40 mg by mouth daily.    . vitamin C (ASCORBIC ACID) 500 MG tablet Take 500 mg by mouth daily.     No current facility-administered medications for this visit.     Allergies as of 04/15/2018 - Review Complete 04/15/2018  Allergen Reaction Noted  . Crestor [rosuvastatin] Other (See Comments) 09/22/2012    Family History  Problem Relation Age of Onset  . Colon cancer Father        8's?  . Lung cancer Father   . Coronary artery disease Father 50       MI  . Arthritis Unknown   . Heart disease Unknown   . Diabetes Unknown      Social History   Socioeconomic History  . Marital status: Married    Spouse name: Not on file  . Number of children: 2  . Years of education: 11  . Highest education level: Not on file  Occupational History  . Occupation: disabled Dealer   Social Needs  . Financial resource strain: Not on file  . Food insecurity:    Worry: Not on file    Inability: Not on file  . Transportation needs:    Medical: Not on file    Non-medical: Not on file  Tobacco Use  . Smoking status: Never Smoker  . Smokeless tobacco: Never Used  Substance and Sexual Activity  .  Alcohol use: Yes    Comment: social-2 beers/wk  . Drug use: No  . Sexual activity: Yes    Birth control/protection: None  Lifestyle  . Physical activity:    Days per week: Not on file    Minutes per session: Not on file  . Stress: Not on file  Relationships  . Social connections:    Talks on phone: Not on file    Gets together: Not on file    Attends religious service: Not on file    Active member of club or organization: Not on file    Attends meetings of clubs or organizations: Not on file    Relationship status: Not on file  Other Topics Concern  . Not on file  Social History Narrative  . Not on file    Review of Systems: Gen: Denies fever, chills, anorexia. Denies fatigue, weakness, weight loss.  CV: Denies chest pain, palpitations, syncope, peripheral edema, and claudication. Resp: Denies dyspnea at rest, cough, wheezing, coughing up blood, and pleurisy. GI: see HPI  Derm: Denies rash, itching, dry skin Psych: Denies depression, anxiety, memory loss, confusion. No homicidal or suicidal ideation.  Heme: Denies bruising, bleeding, and enlarged lymph nodes.  Physical Exam: BP (!) 173/78   Pulse 67   Temp (!) 97 F (36.1 C) (Oral)   Ht 5\' 7"  (1.702 m)   Wt 202 lb 3.2 oz (91.7 kg)   BMI 31.67 kg/m  General:   Alert and oriented. No distress noted. Pleasant and cooperative.  Head:  Normocephalic and  atraumatic. Eyes:  Conjuctiva clear without scleral icterus. Mouth:  Oral mucosa pink and moist. Good dentition. No lesions. Abdomen:  +BS, soft, non-tender and non-distended. No rebound or guarding. No HSM or masses noted. Msk:  Symmetrical without gross deformities. Normal posture. Extremities:  Without edema. Neurologic:  Alert and  oriented x4 Psych:  Alert and cooperative. Normal mood and affect.

## 2018-04-15 NOTE — Assessment & Plan Note (Signed)
Surveillance in 3 years.

## 2018-04-29 ENCOUNTER — Other Ambulatory Visit: Payer: Self-pay | Admitting: "Endocrinology

## 2018-04-29 DIAGNOSIS — Z0000 Encounter for general adult medical examination without abnormal findings: Secondary | ICD-10-CM | POA: Diagnosis not present

## 2018-04-29 DIAGNOSIS — E1159 Type 2 diabetes mellitus with other circulatory complications: Secondary | ICD-10-CM | POA: Diagnosis not present

## 2018-04-29 LAB — COMPREHENSIVE METABOLIC PANEL
ALBUMIN: 4.3 g/dL (ref 3.6–4.8)
ALT: 24 IU/L (ref 0–44)
AST: 17 IU/L (ref 0–40)
Albumin/Globulin Ratio: 2 (ref 1.2–2.2)
Alkaline Phosphatase: 81 IU/L (ref 39–117)
BUN / CREAT RATIO: 13 (ref 10–24)
BUN: 11 mg/dL (ref 8–27)
Bilirubin Total: 0.4 mg/dL (ref 0.0–1.2)
CO2: 27 mmol/L (ref 20–29)
Calcium: 9.6 mg/dL (ref 8.6–10.2)
Chloride: 102 mmol/L (ref 96–106)
Creatinine, Ser: 0.84 mg/dL (ref 0.76–1.27)
GFR calc non Af Amer: 95 mL/min/{1.73_m2} (ref >59)
GFR, EST AFRICAN AMERICAN: 109 mL/min/{1.73_m2} (ref >59)
GLUCOSE: 191 mg/dL — AB (ref 65–99)
Globulin, Total: 2.1 g/dL (ref 1.5–4.5)
Potassium: 4.4 mmol/L (ref 3.5–5.2)
Sodium: 138 mmol/L (ref 134–144)
Total Protein: 6.4 g/dL (ref 6.0–8.5)

## 2018-04-29 LAB — HGB A1C W/O EAG: Hgb A1c MFr Bld: 7.3 % — ABNORMAL HIGH (ref 4.8–5.6)

## 2018-04-30 ENCOUNTER — Other Ambulatory Visit: Payer: Self-pay | Admitting: "Endocrinology

## 2018-05-06 ENCOUNTER — Ambulatory Visit (INDEPENDENT_AMBULATORY_CARE_PROVIDER_SITE_OTHER): Payer: Medicare Other | Admitting: "Endocrinology

## 2018-05-06 ENCOUNTER — Encounter: Payer: Self-pay | Admitting: "Endocrinology

## 2018-05-06 VITALS — BP 139/82 | HR 71 | Ht 67.0 in | Wt 201.0 lb

## 2018-05-06 DIAGNOSIS — E1159 Type 2 diabetes mellitus with other circulatory complications: Secondary | ICD-10-CM | POA: Diagnosis not present

## 2018-05-06 DIAGNOSIS — E782 Mixed hyperlipidemia: Secondary | ICD-10-CM

## 2018-05-06 DIAGNOSIS — I10 Essential (primary) hypertension: Secondary | ICD-10-CM | POA: Diagnosis not present

## 2018-05-06 MED ORDER — FREESTYLE LIBRE 14 DAY SENSOR MISC: 1.0000 | 2 refills | Status: DC

## 2018-05-06 MED ORDER — INSULIN GLARGINE 100 UNIT/ML SOLOSTAR PEN: PEN_INJECTOR | SUBCUTANEOUS | 2 refills | Status: DC

## 2018-05-06 NOTE — Patient Instructions (Signed)

## 2018-05-06 NOTE — Progress Notes (Signed)
Endocrinology follow-up note  Subjective:    Patient ID: Francis Hall, male    DOB: 02-16-1956,    Past Medical History:  Diagnosis Date  . Atrial fibrillation ([place])   . CAD (coronary artery disease)    [place]  . CHF (congestive heart failure) ([place]) 11/08/2010   2D Echo EF=>55%  . Diabetes mellitus    insulin  . Diverticulosis   . Dyspnea   . Family hx of colon cancer   . GERD (gastroesophageal reflux disease)   . HTN (hypertension)   . Hyperlipemia   . Hyperplastic colon polyp 07/16/07   30cm   . Myocardial infarction ([place]) 12/2012  . OSA (obstructive sleep apnea) 12/21/2007   sleep study perform REM 21mins REM and NREM was 95.0%, AHI of 12.31hr and RDI of 12.4hr   Past Surgical History:  Procedure Laterality Date  . CARDIAC CATHETERIZATION  2006 2011   2 stents  . CATARACT EXTRACTION W/PHACO Left 12/03/2013   Procedure: CATARACT EXTRACTION PHACO AND INTRAOCULAR LENS PLACEMENT (IOC);  Surgeon: Tonny Branch, MD;  Location: [facility];  Service: Ophthalmology;  Laterality: Left;  CDE:7.07  . CATARACT EXTRACTION W/PHACO Right 12/14/2013   Procedure: CATARACT EXTRACTION PHACO AND INTRAOCULAR LENS PLACEMENT (IOC);  Surgeon: Tonny Branch, MD;  Location: [facility];  Service: Ophthalmology;  Laterality: Right;  CDE 7.84   . COLONOSCOPY  07/2007   Dr Arnoldo Morale: hyperplastic polyp 30cm  . COLONOSCOPY N/A 08/12/2012   Procedure: COLONOSCOPY;  Surgeon: Danie Binder, MD;  Location: [facility];  Service: Endoscopy;  Laterality: N/A;  8:30  . COLONOSCOPY N/A 11/11/2017   Dr. Oneida Alar: multiple tubular adenomas, next surveillance in 2022.   [name] CORONARY ARTERY BYPASS GRAFT  06/2004   x2 performed for 40% left main stenosis, 80% proximal LAD stenosis of the first diagonal artery, and 50% left circumfles artery priximal stenosis.LIMA to LAD and free radial artery to circumflex coronary artery  . LEFT HEART CATHETERIZATION WITH CORONARY/GRAFT ANGIOGRAM  12/29/2012   Procedure: LEFT HEART  CATHETERIZATION WITH Beatrix Fetters;  Surgeon: Leonie Man, MD;  Location: [facility] C[facility] LAB;  Service: Cardiovascular;;  . POLYPECTOMY  11/11/2017   Procedure: POLYPECTOMY;  Surgeon: Danie Binder, MD;  Location: [facility];  Service: Endoscopy;;  ascending colon, transverse, splenic flexure, sigmoid  . TONSILLECTOMY  1962   Social History   Socioeconomic History  . Marital status: Married    Spouse name: Not on file  . Number of children: 2  . Years of education: 44  . Highest education level: Not on file  Occupational History  . Occupation: disabled Dealer   Social Needs  . Financial resource strain: Not on file  . Food insecurity:    Worry: Not on file    Inability: Not on file  . Transportation needs:    Medical: Not on file    Non-medical: Not on file  Tobacco Use  . Smoking status: Never Smoker  . Smokeless tobacco: Never Used  Substance and Sexual Activity  . Alcohol use: Yes    Comment: social-2 beers/wk  . Drug use: No  . Sexual activity: Yes    Birth control/protection: None  Lifestyle  . Physical activity:    Days per week: Not on file    Minutes per session: Not on file  . Stress: Not on file  Relationships  . Social connections:    Talks on phone: Not on file    Gets together: Not on file  Attends religious service: Not on file    Active member of club or organization: Not on file    Attends meetings of clubs or organizations: Not on file    Relationship status: Not on file  Other Topics Concern  . Not on file  Social History Narrative  . Not on file   Outpatient Encounter Medications as of 05/06/2018  Medication Sig  . acetaminophen (TYLENOL) 325 MG tablet Take 650 mg by mouth every 6 (six) hours as needed for moderate pain or headache.  [name] aspirin 81 MG EC tablet Take 81 mg by mouth daily.    [name] atorvastatin (LIPITOR) 20 MG tablet TAKE 1 TABLET BY MOUTH EVERYDAY AT BEDTIME  . carvedilol (COREG) 12.5 MG tablet Take 1 tablet (12.5 mg  total) by mouth 2 (two) times daily with a meal.  . Cholecalciferol (VITAMIN D3) 1000 UNITS CAPS Take 2,000 Units by mouth daily.   . Continuous Blood Gluc Sensor (FREESTYLE LIBRE 14 DAY SENSOR) MISC Inject 1 each into the skin every 14 (fourteen) days. Use as directed.  . diltiazem (CARDIZEM CD) 120 MG 24 hr capsule Take 1 capsule (120 mg total) by mouth daily.  . fenofibrate (TRICOR) 48 MG tablet TAKE 1 TABLET (48 MG TOTAL) BY MOUTH DAILY.  . hydrochlorothiazide (MICROZIDE) 12.5 MG capsule TAKE 1 CAPSULE BY MOUTH AS  NEEDED. (Patient taking differently: Take 12.5 mg by mouth daily as needed (for fluid). )  . insulin aspart (NOVOLOG FLEXPEN) 100 UNIT/ML FlexPen Inject 10-16 Units into the skin 3 (three) times daily with meals. Per sliding scale  . Insulin Glargine (LANTUS SOLOSTAR) 100 UNIT/ML Solostar Pen INJECT 70 UNITS SUBCUTANEOUSLY UNITS AT BEDTIME  . lisinopril (PRINIVIL,ZESTRIL) 20 MG tablet TAKE 1 TABLET BY MOUTH TWICE A DAY  . metFORMIN (GLUCOPHAGE) 1000 MG tablet Take 1 tablet (1,000 mg total) by mouth 2 (two) times daily with a meal.  . Multiple Vitamin (MULTIVITAMIN) capsule Take 1 capsule by mouth daily.    . nitroGLYCERIN (NITROSTAT) 0.4 MG SL tablet PLACE 1 TABLET UNDER TONGUE IF NEEDED FOR CHEST PAIN.[name] OF 3 DOSES (Patient taking differently: Place 0.4 mg under the tongue every 5 (five) minutes as needed for chest pain. )  . pantoprazole (PROTONIX) 40 MG tablet Take 40 mg by mouth daily.  . vitamin C (ASCORBIC ACID) 500 MG tablet Take 500 mg by mouth daily.  . [DISCONTINUED] Continuous Blood Gluc Sensor (FREESTYLE LIBRE 14 DAY SENSOR) MISC INJECT 1 EACH INTO THE SKIN EVERY 14 (FOURTEEN) DAYS. USE AS DIRECTED.  . [DISCONTINUED] Continuous Blood Gluc Sensor (FREESTYLE LIBRE 14 DAY SENSOR) MISC INJECT 1 EACH INTO THE SKIN EVERY 14 (FOURTEEN) DAYS. USE AS DIRECTED. (Patient not taking: Reported on 05/06/2018)  . [DISCONTINUED] Continuous Blood Gluc Sensor (FREESTYLE LIBRE 14 DAY SENSOR)  MISC INJECT 1 EACH INTO THE SKIN EVERY 14 (FOURTEEN) DAYS. USE AS DIRECTED.  . [DISCONTINUED] Icosapent Ethyl (VASCEPA) 1 g CAPS Take 2 capsules (2 g total) by mouth 2 (two) times daily.  . [DISCONTINUED] Insulin Glargine (LANTUS SOLOSTAR) 100 UNIT/ML Solostar Pen INJECT 60 UNITS SUBCUTANEOUSLY UNITS AT BEDTIME   No facility-administered encounter medications on file as of 05/06/2018.    ALLERGIES: Allergies  Allergen Reactions  . Crestor [Rosuvastatin] Other (See Comments)    myalgia   VACCINATION STATUS:  There is no immunization history on file for this patient.  Diabetes  He presents for his follow-up diabetic visit. He has type 2 diabetes mellitus. Onset time: He was diagnosed at approximate age  of 40 years. His disease course has been worsening. There are no hypoglycemic associated symptoms. Pertinent negatives for hypoglycemia include no confusion, headaches, pallor or seizures. There are no diabetic associated symptoms. Pertinent negatives for diabetes include no chest pain, no fatigue, no polydipsia, no polyphagia, no polyuria and no weakness. There are no hypoglycemic complications. Symptoms are worsening. Diabetic complications include heart disease and peripheral neuropathy. Risk factors for coronary artery disease include dyslipidemia, diabetes mellitus, hypertension and male sex. Current diabetic treatment includes intensive insulin program. His weight is fluctuating minimally. He is following a generally unhealthy diet. He has had a previous visit with a dietitian. He participates in exercise intermittently. His home blood glucose trend is decreasing steadily. His breakfast blood glucose range is generally 180-200 mg/dl. His lunch blood glucose range is generally 140-180 mg/dl. His dinner blood glucose range is generally 140-180 mg/dl. His bedtime blood glucose range is generally 140-180 mg/dl. His overall blood glucose range is 140-180 mg/dl. An ACE inhibitor/angiotensin II receptor  blocker is being taken. Eye exam is current.  Hypertension  This is a chronic problem. The current episode started more than 1 year ago. The problem has been waxing and waning since onset. Pertinent negatives include no chest pain, headaches, neck pain, palpitations or shortness of breath. Risk factors for coronary artery disease include diabetes mellitus, dyslipidemia, male gender and sedentary lifestyle. Past treatments include ACE inhibitors and beta blockers. Hypertensive end-organ damage includes CAD/MI.  Hyperlipidemia  This is a chronic problem. The current episode started more than 1 year ago. The problem is controlled. Recent lipid tests were reviewed and are high. Exacerbating diseases include diabetes and obesity. Pertinent negatives include no chest pain, myalgias or shortness of breath. Current antihyperlipidemic treatment includes statins. Risk factors for coronary artery disease include diabetes mellitus, dyslipidemia, hypertension, male sex, a sedentary lifestyle, obesity and family history.    Review of Systems  Constitutional: Negative for fatigue and unexpected weight change.  HENT: Negative for dental problem, mouth sores and trouble swallowing.   Eyes: Negative for visual disturbance.  Respiratory: Negative for cough, choking, chest tightness, shortness of breath and wheezing.   Cardiovascular: Negative for chest pain, palpitations and leg swelling.  Gastrointestinal: Negative for abdominal distention, abdominal pain, constipation, diarrhea, nausea and vomiting.  Endocrine: Negative for polydipsia, polyphagia and polyuria.  Genitourinary: Negative for dysuria, flank pain, hematuria and urgency.  Musculoskeletal: Negative for back pain, gait problem, myalgias and neck pain.  Skin: Negative for pallor, rash and wound.  Neurological: Negative for seizures, syncope, weakness, numbness and headaches.  Psychiatric/Behavioral: Negative.  Negative for confusion and dysphoric mood.     Objective:    BP 139/82   Pulse 71   Ht 5\' 7"  (1.702 m)   Wt 201 lb (91.2 kg)   BMI 31.48 kg/m   Wt Readings from Last 3 Encounters:  05/06/18 201 lb (91.2 kg)  04/15/18 202 lb 3.2 oz (91.7 kg)  12/31/17 193 lb (87.5 kg)    Physical Exam  Constitutional: He is oriented to person, place, and time. He appears well-developed. He is cooperative. No distress.  HENT:  Head: Normocephalic and atraumatic.  Eyes: EOM are normal.  Neck: Normal range of motion. Neck supple. No tracheal deviation present. No thyromegaly present.  Cardiovascular: Normal rate, S1 normal and S2 normal. Exam reveals no gallop.  No murmur heard. Pulses:      Dorsalis pedis pulses are 1+ on the right side, and 1+ on the left side.  Posterior tibial pulses are 1+ on the right side, and 1+ on the left side.  Pulmonary/Chest: Effort normal. No respiratory distress. He has no wheezes.  Abdominal: He exhibits no distension. There is no tenderness. There is no guarding and no CVA tenderness.  Musculoskeletal: He exhibits no edema.       Right shoulder: He exhibits no swelling and no deformity.  Neurological: He is alert and oriented to person, place, and time. He has normal strength and normal reflexes. No cranial nerve deficit or sensory deficit. Gait normal.  Skin: Skin is warm and dry. No rash noted. No cyanosis. Nails show no clubbing.  Psychiatric: He has a normal mood and affect. His speech is normal. Judgment normal. Cognition and memory are normal.    Results for orders placed or performed in visit on 04/29/18  Comprehensive metabolic panel  Result Value Ref Range   Glucose 191 (H) 65 - 99 mg/dL   BUN 11 8 - 27 mg/dL   Creatinine, Ser 0.84 0.76 - 1.27 mg/dL   GFR calc non Af Amer 95 >59 mL/min/1.73   GFR calc Af Amer 109 >59 mL/min/1.73   BUN/Creatinine Ratio 13 10 - 24   Sodium 138 134 - 144 mmol/L   Potassium 4.4 3.5 - 5.2 mmol/L   Chloride 102 96 - 106 mmol/L   CO2 27 20 - 29 mmol/L    Calcium 9.6 8.6 - 10.2 mg/dL   Total Protein 6.4 6.0 - 8.5 g/dL   Albumin 4.3 3.6 - 4.8 g/dL   Globulin, Total 2.1 1.5 - 4.5 g/dL   Albumin/Globulin Ratio 2.0 1.2 - 2.2   Bilirubin Total 0.4 0.0 - 1.2 mg/dL   Alkaline Phosphatase 81 39 - 117 IU/L   AST 17 0 - 40 IU/L   ALT 24 0 - 44 IU/L  Hgb A1c w/o eAG  Result Value Ref Range   Hgb A1c MFr Bld 7.3 (H) 4.8 - 5.6 %   Diabetic Labs (most recent): Lab Results  Component Value Date   HGBA1C 7.3 (H) 04/29/2018   HGBA1C 6.9 (H) 12/24/2017   HGBA1C 7.4 (H) 09/23/2017   Lipid Panel     Component Value Date/Time   CHOL 159 12/24/2017 0952   TRIG 254 (H) 12/24/2017 0952   HDL 34 (L) 12/24/2017 0952   CHOLHDL 4.8 01/22/2017 1039   CHOLHDL 7.0 12/29/2012 0438   VLDL UNABLE TO CALCULATE IF TRIGLYCERIDE OVER 400 mg/dL 12/29/2012 0438   LDLCALC 74 12/24/2017 0952     Assessment & Plan:   1. Type 2 diabetes mellitus with vascular disease ([place])  His diabetes is  complicated by coronary artery disease status post CABG in 2006 and recent stent placement in 2014. Patient came with increasing A1c of 7.3% from 6.9%.  He has fasting hyperglycemia.  - He admits to significant dietary indiscretion during the holiday season.   Glucose logs and insulin administration records pertaining to this visit,  to be scanned into patient's records.    -Recent labs reviewed. - [name] remains at a high risk for more acute and chronic complications of diabetes which include CAD, CVA, CKD, retinopathy, and neuropathy. These are all discussed in detail with the patient.  - I have re-counseled the patient on diet management and weight loss  by adopting a carbohydrate restricted / protein rich  Diet.  - Patient is advised to stick to a routine mealtimes to eat 3 meals  a day and avoid unnecessary snacks ( to snack  only to correct hypoglycemia).  -  Suggestion is made for him to avoid simple carbohydrates  from his diet including Cakes, Sweet Desserts /  Pastries, Ice Cream, Soda (diet and regular), Sweet Tea, Candies, Chips, Cookies, Store Bought Juices, Alcohol in Excess of  1-2 drinks a day, Artificial Sweeteners, and "Sugar-free" Products. This will help patient to have stable blood glucose profile and potentially avoid unintended weight gain.  - I have approached patient with the following individualized plan to manage diabetes and patient agrees.  -  He'll continue to require basal/bolus insulin to maintain diabetes control to target.    -He is advised to increase his Lantus to 70 units nightly, continue NovoLog  10 units 3 times a day before meals  for pre-meal BG readings of 90-150mg /dl, plus patient specific correction dose of rapid acting insulin  for unexpected hyperglycemia above 150mg /dl, associated with strict monitoring of glucose  AC and HS. - Patient is warned not to take insulin without proper monitoring per orders. -Adjustment parameters are given for hypo and hyperglycemia in writing. -Patient is encouraged to call clinic for blood glucose levels less than 70 or above 300 mg /dl.  -He is advised to continue metformin 1000 mg p.o. twice daily-daily after breakfast and supper.   - Patient specific target  for A1c; LDL, HDL, Triglycerides, and  Waist Circumference were discussed in detail.  2) BP/HTN: His blood pressure is controlled to target.  He is advised to continue his current blood pressure medications including lisinopril 20 mg p.o. daily, beta blockers   3) Lipids/HPL: Uncontrolled with low is improving hypertriglyceridemia to 254 from 1700.    He is advised to be consistent with his Lovaza 2 g by mouth twice a day, fenofibrate 145 mg by mouth daily, and  Lipitor 20 mg by mouth daily at bedtime.  4)  Weight/Diet: CDE consult in progress, exercise, and carbohydrates information provided.  5) Chronic Care/Health Maintenance:  -Patient  Is on ACEI and Statin medications and encouraged to continue to follow up with  Ophthalmology, Podiatrist at least yearly or according to recommendations, and advised to stay away from smoking. I have recommended yearly flu vaccine and pneumonia vaccination at least every 5 years; moderate intensity exercise for up to 150 minutes weekly; and  sleep for at least 7 hours a day.  I advised patient to maintain close follow up with his PCP for primary care needs.  - Time spent with the patient: 25 min, of which >50% was spent in reviewing his blood glucose logs , discussing his hypo- and hyper-glycemic episodes, reviewing his current and  previous labs and insulin doses and developing a plan to avoid hypo- and hyper-glycemia. Please refer to Patient Instructions for Blood Glucose Monitoring and Insulin/Medications Dosing Guide"  in media tab for additional information. [name] participated in the discussions, expressed understanding, and voiced agreement with the above plans.  All questions were answered to his satisfaction. he is encouraged to contact clinic should he have any questions or concerns prior to his return visit.   Follow up plan: Return in about 4 months (around 09/05/2018) for Meter, and Logs, Follow up with Pre-visit Labs, Meter, and Logs.  Glade Lloyd, MD Phone: [phone]  Fax: [phone]  -  This note was partially dictated with voice recognition software. Similar sounding words can be transcribed inadequately or may not  be corrected upon review.  05/06/2018, 1:10 PM

## 2018-05-07 ENCOUNTER — Other Ambulatory Visit: Payer: Self-pay

## 2018-05-07 MED ORDER — FREESTYLE LIBRE 14 DAY SENSOR MISC: 1.0000 | 1 refills | Status: DC

## 2018-05-29 ENCOUNTER — Other Ambulatory Visit: Payer: Self-pay

## 2018-05-29 MED ORDER — BLOOD GLUCOSE MONITOR KIT: PACK | 5 refills | Status: AC

## 2018-06-05 ENCOUNTER — Other Ambulatory Visit: Payer: Self-pay

## 2018-06-05 MED ORDER — METFORMIN HCL 1000 MG PO TABS: 1000.0000 mg | ORAL_TABLET | Freq: Two times a day (BID) | ORAL | 0 refills | Status: DC

## 2018-06-05 MED ORDER — INSULIN GLARGINE 100 UNIT/ML SOLOSTAR PEN
PEN_INJECTOR | SUBCUTANEOUS | 0 refills | Status: DC
Start: 2018-06-05 — End: 2019-01-06

## 2018-06-05 MED ORDER — INSULIN LISPRO (1 UNIT DIAL) 100 UNIT/ML (KWIKPEN): 10.0000 [IU] | PEN_INJECTOR | Freq: Three times a day (TID) | SUBCUTANEOUS | 0 refills | Status: DC

## 2018-06-06 ENCOUNTER — Telehealth: Payer: Self-pay | Admitting: Cardiovascular Disease

## 2018-06-06 MED ORDER — NITROGLYCERIN 0.4 MG SL SUBL: SUBLINGUAL_TABLET | SUBLINGUAL | 0 refills | Status: DC

## 2018-06-06 MED ORDER — LISINOPRIL 20 MG PO TABS: ORAL_TABLET | ORAL | 0 refills | Status: DC

## 2018-06-06 MED ORDER — FENOFIBRATE 48 MG PO TABS: 48.0000 mg | ORAL_TABLET | Freq: Every day | ORAL | 0 refills | Status: DC

## 2018-06-06 MED ORDER — ATORVASTATIN CALCIUM 20 MG PO TABS: ORAL_TABLET | ORAL | 0 refills | Status: DC

## 2018-06-06 MED ORDER — DILTIAZEM HCL ER COATED BEADS 120 MG PO CP24: 120.0000 mg | ORAL_CAPSULE | Freq: Every day | ORAL | 0 refills | Status: DC

## 2018-06-06 MED ORDER — HYDROCHLOROTHIAZIDE 12.5 MG PO CAPS: ORAL_CAPSULE | ORAL | 0 refills | Status: DC

## 2018-06-06 NOTE — Telephone Encounter (Signed)
Refill sent to the pharmacy electronically.  Needs ov.

## 2018-06-06 NOTE — Telephone Encounter (Signed)
°*  STAT* If patient is at the pharmacy, call can be transferred to refill team.   1. Which medications need to be refilled? (please list name of each medication and dose if known)   carvedilol (COREG) 12.5 MG tablet  diltiazem (CARDIZEM CD) 120 MG 24 hr capsule   fenofibrate (TRICOR) 48 MG tablet   hydrochlorothiazide (MICROZIDE) 12.5 MG capsule   lisinopril (PRINIVIL,ZESTRIL) 20 MG tablet  nitroGLYCERIN (NITROSTAT) 0.4 MG SL tablet  atorvastatin (LIPITOR) 20 MG tablet    2. Which pharmacy/location (including street and city if local pharmacy) is medication to be sent to?  [id number] - [place], [address]  3. Do they need a 30 day or 90 day supply?  90 days

## 2018-06-20 ENCOUNTER — Other Ambulatory Visit: Payer: Self-pay

## 2018-06-20 MED ORDER — INSULIN LISPRO (1 UNIT DIAL) 100 UNIT/ML (KWIKPEN): 10.0000 [IU] | PEN_INJECTOR | Freq: Three times a day (TID) | SUBCUTANEOUS | 0 refills | Status: DC

## 2018-07-01 ENCOUNTER — Other Ambulatory Visit: Payer: Self-pay | Admitting: "Endocrinology

## 2018-07-23 DIAGNOSIS — Z6831 Body mass index (BMI) 31.0-31.9, adult: Secondary | ICD-10-CM | POA: Diagnosis not present

## 2018-07-23 DIAGNOSIS — E6609 Other obesity due to excess calories: Secondary | ICD-10-CM | POA: Diagnosis not present

## 2018-07-23 DIAGNOSIS — K219 Gastro-esophageal reflux disease without esophagitis: Secondary | ICD-10-CM | POA: Diagnosis not present

## 2018-07-23 DIAGNOSIS — H04309 Unspecified dacryocystitis of unspecified lacrimal passage: Secondary | ICD-10-CM | POA: Diagnosis not present

## 2018-08-28 ENCOUNTER — Other Ambulatory Visit: Payer: Self-pay | Admitting: "Endocrinology

## 2018-08-29 ENCOUNTER — Other Ambulatory Visit: Payer: Self-pay | Admitting: "Endocrinology

## 2018-08-29 DIAGNOSIS — E1159 Type 2 diabetes mellitus with other circulatory complications: Secondary | ICD-10-CM | POA: Diagnosis not present

## 2018-08-30 LAB — COMPREHENSIVE METABOLIC PANEL
ALT: 29 IU/L (ref 0–44)
AST: 17 IU/L (ref 0–40)
Albumin/Globulin Ratio: 2 (ref 1.2–2.2)
Albumin: 4.3 g/dL (ref 3.8–4.8)
Alkaline Phosphatase: 84 IU/L (ref 39–117)
BUN/Creatinine Ratio: 17 (ref 10–24)
BUN: 17 mg/dL (ref 8–27)
Bilirubin Total: 0.4 mg/dL (ref 0.0–1.2)
CALCIUM: 9.5 mg/dL (ref 8.6–10.2)
CO2: 22 mmol/L (ref 20–29)
Chloride: 100 mmol/L (ref 96–106)
Creatinine, Ser: 1.02 mg/dL (ref 0.76–1.27)
GFR calc Af Amer: 91 mL/min/{1.73_m2} (ref >59)
GFR, EST NON AFRICAN AMERICAN: 79 mL/min/{1.73_m2} (ref >59)
Globulin, Total: 2.2 g/dL (ref 1.5–4.5)
Glucose: 226 mg/dL — ABNORMAL HIGH (ref 65–99)
Potassium: 4.7 mmol/L (ref 3.5–5.2)
Sodium: 139 mmol/L (ref 134–144)
Total Protein: 6.5 g/dL (ref 6.0–8.5)

## 2018-08-30 LAB — HGB A1C W/O EAG: Hgb A1c MFr Bld: 7.5 % — ABNORMAL HIGH (ref 4.8–5.6)

## 2018-08-30 LAB — SPECIMEN STATUS REPORT

## 2018-09-01 ENCOUNTER — Telehealth: Payer: Self-pay | Admitting: Cardiovascular Disease

## 2018-09-01 MED ORDER — DILTIAZEM HCL ER COATED BEADS 120 MG PO CP24: 120.0000 mg | ORAL_CAPSULE | Freq: Every day | ORAL | 1 refills | Status: DC

## 2018-09-01 NOTE — Telephone Encounter (Signed)
Rx request faxed from [facility] in [place] for Diltiazem CD 120 mg.  Rx refilled for QTY: 90 tab with 1 refill.

## 2018-09-02 ENCOUNTER — Encounter: Payer: Self-pay | Admitting: "Endocrinology

## 2018-09-03 ENCOUNTER — Encounter: Payer: Self-pay | Admitting: "Endocrinology

## 2018-09-03 ENCOUNTER — Other Ambulatory Visit: Payer: Self-pay

## 2018-09-03 ENCOUNTER — Ambulatory Visit (INDEPENDENT_AMBULATORY_CARE_PROVIDER_SITE_OTHER): Payer: Medicare Other | Admitting: "Endocrinology

## 2018-09-03 DIAGNOSIS — E1159 Type 2 diabetes mellitus with other circulatory complications: Secondary | ICD-10-CM | POA: Diagnosis not present

## 2018-09-03 DIAGNOSIS — E782 Mixed hyperlipidemia: Secondary | ICD-10-CM

## 2018-09-03 NOTE — Progress Notes (Signed)
Endocrinology Telephone Visit Follow up Note -During COVID -19 Pandemic   Subjective:    Patient ID: Francis Hall, male    DOB: [birth year],    Past Medical History:  Diagnosis Date  . Atrial fibrillation ([place])   . CAD (coronary artery disease)    [facility]  . CHF (congestive heart failure) ([place]) 11/08/2010   2D Echo EF=>55%  . Diabetes mellitus    insulin  . Diverticulosis   . Dyspnea   . Family hx of colon cancer   . GERD (gastroesophageal reflux disease)   . HTN (hypertension)   . Hyperlipemia   . Hyperplastic colon polyp 07/16/07   30cm   . Myocardial infarction ([place]) 12/2012  . OSA (obstructive sleep apnea) 12/21/2007   sleep study perform REM 20mns REM and NREM was 95.0%, AHI of 12.31hr and RDI of 12.4hr   Past Surgical History:  Procedure Laterality Date  . CARDIAC CATHETERIZATION  2006 2011   2 stents  . CATARACT EXTRACTION W/PHACO Left 12/03/2013   Procedure: CATARACT EXTRACTION PHACO AND INTRAOCULAR LENS PLACEMENT (IOC);  Surgeon: KTonny Branch MD;  Location: [facility];  Service: Ophthalmology;  Laterality: Left;  CDE:7.07  . CATARACT EXTRACTION W/PHACO Right 12/14/2013   Procedure: CATARACT EXTRACTION PHACO AND INTRAOCULAR LENS PLACEMENT (IOC);  Surgeon: KTonny Branch MD;  Location: [facility];  Service: Ophthalmology;  Laterality: Right;  CDE 7.84   . COLONOSCOPY  07/2007   Dr JArnoldo Morale hyperplastic polyp 30cm  . COLONOSCOPY N/A 08/12/2012   Procedure: COLONOSCOPY;  Surgeon: SDanie Binder MD;  Location: [facility];  Service: Endoscopy;  Laterality: N/A;  8:30  . COLONOSCOPY N/A 11/11/2017   Dr. FOneida Alar multiple tubular adenomas, next surveillance in 2022.   .Marland KitchenCORONARY ARTERY BYPASS GRAFT  06/2004   x2 performed for 40% left main stenosis, 80% proximal LAD stenosis of the first diagonal artery, and 50% left circumfles artery priximal stenosis.LIMA to LAD and free radial artery to circumflex coronary artery  . LEFT HEART  CATHETERIZATION WITH CORONARY/GRAFT ANGIOGRAM  12/29/2012   Procedure: LEFT HEART CATHETERIZATION WITH CBeatrix Fetters  Surgeon: DLeonie Man MD;  [facility];  Service: Cardiovascular;;  . POLYPECTOMY  11/11/2017   Procedure: POLYPECTOMY;  Surgeon: FDanie Binder MD;  Location: [facility];  Service: Endoscopy;;  ascending colon, transverse, splenic flexure, sigmoid  . TONSILLECTOMY  1962   Social History   Socioeconomic History  . Marital status: Married    Spouse name: Not on file  . Number of children: 2  . Years of education: 122 . Highest education level: Not on file  Occupational History  . Occupation: disabled mDealer  Social Needs  . Financial resource strain: Not on file  . Food insecurity:    Worry: Not on file    Inability: Not on file  . Transportation needs:    Medical: Not on file    Non-medical: Not on file  Tobacco Use  . Smoking status: Never Smoker  . Smokeless tobacco: Never Used  Substance and Sexual Activity  . Alcohol use: Yes    Comment: social-2 beers/wk  . Drug use: No  . Sexual activity: Yes  Birth control/protection: None  Lifestyle  . Physical activity:    Days per week: Not on file    Minutes per session: Not on file  . Stress: Not on file  Relationships  . Social connections:    Talks on phone: Not on file    Gets together: Not on file    Attends religious service: Not on file    Active member of club or organization: Not on file    Attends meetings of clubs or organizations: Not on file    Relationship status: Not on file  Other Topics Concern  . Not on file  Social History Narrative  . Not on file   Outpatient Encounter Medications as of 09/03/2018  Medication Sig  . acetaminophen (TYLENOL) 325 MG tablet Take 650 mg by mouth every 6 (six) hours as needed for moderate pain or headache.  [name] aspirin 81 MG EC tablet Take 81 mg by mouth daily.    [name] atorvastatin (LIPITOR) 20 MG tablet TAKE 1 TABLET BY MOUTH  EVERYDAY AT BEDTIME  . blood glucose meter kit and supplies KIT Dispense based on patient and insurance preference. Use up to four times daily as directed. (FOR ICD-10 E11.65)  . carvedilol (COREG) 12.5 MG tablet Take 1 tablet (12.5 mg total) by mouth 2 (two) times daily with a meal.  . Cholecalciferol (VITAMIN D3) 1000 UNITS CAPS Take 2,000 Units by mouth daily.   . Continuous Blood Gluc Sensor (FREESTYLE LIBRE 14 DAY SENSOR) MISC Inject 1 each into the skin every 14 (fourteen) days. Use as directed.  . diltiazem (CARDIZEM CD) 120 MG 24 hr capsule Take 1 capsule (120 mg total) by mouth daily.  . fenofibrate (TRICOR) 48 MG tablet Take 1 tablet (48 mg total) by mouth daily.  [name] HUMALOG KWIKPEN 100 UNIT/ML KwikPen INJECT UNDER THE SKIN 10 TO 16 UNITS THREE TIMES DAILY BEFORE MEALS  . hydrochlorothiazide (MICROZIDE) 12.5 MG capsule TAKE 1 CAPSULE BY MOUTH AS  NEEDED.  [name] Insulin Glargine (LANTUS SOLOSTAR) 100 UNIT/ML Solostar Pen INJECT 70 UNITS SUBCUTANEOUSLY UNITS AT BEDTIME  . lisinopril (PRINIVIL,ZESTRIL) 20 MG tablet TAKE 1 TABLET BY MOUTH TWICE A DAY  . metFORMIN (GLUCOPHAGE) 1000 MG tablet TAKE 1 TABLET BY MOUTH TWO TIMES DAILY WITH A MEAL  . Multiple Vitamin (MULTIVITAMIN) capsule Take 1 capsule by mouth daily.    . nitroGLYCERIN (NITROSTAT) 0.4 MG SL tablet PLACE 1 TABLET UNDER TONGUE IF NEEDED FOR CHEST PAIN..MAX OF 3 DOSES  . pantoprazole (PROTONIX) 40 MG tablet Take 40 mg by mouth daily.  . vitamin C (ASCORBIC ACID) 500 MG tablet Take 500 mg by mouth daily.  . [DISCONTINUED] insulin aspart (NOVOLOG FLEXPEN) 100 UNIT/ML FlexPen Inject 10-16 Units into the skin 3 (three) times daily with meals. Per sliding scale   No facility-administered encounter medications on file as of 09/03/2018.    ALLERGIES: Allergies  Allergen Reactions  . Crestor [Rosuvastatin] Other (See Comments)    myalgia   VACCINATION STATUS:  There is no immunization history on file for this patient.  Diabetes  He  presents for his follow-up diabetic visit. He has type 2 diabetes mellitus. Onset time: He was diagnosed at approximate age of 47 years. His disease course has been stable. There are no hypoglycemic associated symptoms. Pertinent negatives for hypoglycemia include no confusion, pallor or seizures. There are no diabetic associated symptoms. Pertinent negatives for diabetes include no fatigue, no polydipsia, no polyphagia, no polyuria and no weakness. There are no hypoglycemic complications. Symptoms  are stable. Diabetic complications include heart disease and peripheral neuropathy. Risk factors for coronary artery disease include dyslipidemia, diabetes mellitus, hypertension and male sex. Current diabetic treatment includes intensive insulin program. His weight is fluctuating minimally. He is following a generally unhealthy diet. He has had a previous visit with a dietitian. He participates in exercise intermittently. His home blood glucose trend is decreasing steadily. His breakfast blood glucose range is generally 180-200 mg/dl. His lunch blood glucose range is generally 140-180 mg/dl. His dinner blood glucose range is generally 140-180 mg/dl. His bedtime blood glucose range is generally 140-180 mg/dl. His overall blood glucose range is 140-180 mg/dl. An ACE inhibitor/angiotensin II receptor blocker is being taken. Eye exam is current.  Hyperlipidemia  This is a chronic problem. The current episode started more than 1 year ago. The problem is controlled. Recent lipid tests were reviewed and are high. Exacerbating diseases include diabetes and obesity. Pertinent negatives include no myalgias. Current antihyperlipidemic treatment includes statins. Risk factors for coronary artery disease include diabetes mellitus, dyslipidemia, hypertension, male sex, a sedentary lifestyle, obesity and family history.    Review of Systems  Constitutional: Negative for fatigue and unexpected weight change.  HENT: Negative for  dental problem, mouth sores and trouble swallowing.   Eyes: Negative for visual disturbance.  Respiratory: Negative for cough, choking, chest tightness and wheezing.   Cardiovascular: Negative for leg swelling.  Gastrointestinal: Negative for abdominal distention, abdominal pain, constipation, diarrhea, nausea and vomiting.  Endocrine: Negative for polydipsia, polyphagia and polyuria.  Genitourinary: Negative for dysuria, flank pain, hematuria and urgency.  Musculoskeletal: Negative for back pain, gait problem and myalgias.  Skin: Negative for pallor, rash and wound.  Neurological: Negative for seizures, syncope, weakness and numbness.  Psychiatric/Behavioral: Negative.  Negative for confusion and dysphoric mood.    Objective:    There were no vitals taken for this visit.  Wt Readings from Last 3 Encounters:  05/06/18 201 lb (91.2 kg)  04/15/18 202 lb 3.2 oz (91.7 kg)  12/31/17 193 lb (87.5 kg)    Physical Exam  Constitutional: He is oriented to person, place, and time. He appears well-developed. He is cooperative. No distress.  HENT:  Head: Normocephalic and atraumatic.  Eyes: EOM are normal.  Neck: Normal range of motion. Neck supple. No tracheal deviation present. No thyromegaly present.  Cardiovascular: Normal rate, S1 normal and S2 normal. Exam reveals no gallop.  No murmur heard. Pulses:      Dorsalis pedis pulses are 1+ on the right side and 1+ on the left side.       Posterior tibial pulses are 1+ on the right side and 1+ on the left side.  Pulmonary/Chest: Effort normal. No respiratory distress. He has no wheezes.  Abdominal: He exhibits no distension. There is no abdominal tenderness. There is no guarding and no CVA tenderness.  Musculoskeletal:        General: No edema.     Right shoulder: He exhibits no swelling and no deformity.  Neurological: He is alert and oriented to person, place, and time. He has normal strength and normal reflexes. No cranial nerve deficit or  sensory deficit. Gait normal.  Skin: Skin is warm and dry. No rash noted. No cyanosis. Nails show no clubbing.  Psychiatric: He has a normal mood and affect. His speech is normal. Judgment normal. Cognition and memory are normal.    Results for orders placed or performed in visit on 08/29/18  Comprehensive metabolic panel  Result Value Ref Range  Glucose 226 (H) 65 - 99 mg/dL   BUN 17 8 - 27 mg/dL   Creatinine, Ser 1.02 0.76 - 1.27 mg/dL   GFR calc non Af Amer 79 >59 mL/min/1.73   GFR calc Af Amer 91 >59 mL/min/1.73   BUN/Creatinine Ratio 17 10 - 24   Sodium 139 134 - 144 mmol/L   Potassium 4.7 3.5 - 5.2 mmol/L   Chloride 100 96 - 106 mmol/L   CO2 22 20 - 29 mmol/L   Calcium 9.5 8.6 - 10.2 mg/dL   Total Protein 6.5 6.0 - 8.5 g/dL   Albumin 4.3 3.8 - 4.8 g/dL   Globulin, Total 2.2 1.5 - 4.5 g/dL   Albumin/Globulin Ratio 2.0 1.2 - 2.2   Bilirubin Total 0.4 0.0 - 1.2 mg/dL   Alkaline Phosphatase 84 39 - 117 IU/L   AST 17 0 - 40 IU/L   ALT 29 0 - 44 IU/L  Hgb A1c w/o eAG  Result Value Ref Range   Hgb A1c MFr Bld 7.5 (H) 4.8 - 5.6 %  Specimen status report  Result Value Ref Range   specimen status report Comment    Diabetic Labs (most recent): Lab Results  Component Value Date   HGBA1C 7.5 (H) 08/29/2018   HGBA1C 7.3 (H) 04/29/2018   HGBA1C 6.9 (H) 12/24/2017   Lipid Panel     Component Value Date/Time   CHOL 159 12/24/2017 0952   TRIG 254 (H) 12/24/2017 0952   HDL 34 (L) 12/24/2017 0952   CHOLHDL 4.8 01/22/2017 1039   CHOLHDL 7.0 12/29/2012 0438   VLDL UNABLE TO CALCULATE IF TRIGLYCERIDE OVER 400 mg/dL 12/29/2012 0438   LDLCALC 74 12/24/2017 0952     Assessment & Plan:   1. Type 2 diabetes mellitus with vascular disease ([place])  His diabetes is  complicated by coronary artery disease status post CABG in 2006 and recent stent placement in 2014. Patient is reporting above target fasting blood glucose profile, A1c of 7.5%, progressively improving from 6.9%.  He is  fasting hyperglycemia is due to the fact that he lowered his Lantus to 50 to 60 units instead of 70 units prescribed.     - He admits to significant dietary indiscretion during the holiday season.   Glucose logs and insulin administration records pertaining to this visit,  to be scanned into patient's records.    -Recent labs reviewed. - [name] remains at a high risk for more acute and chronic complications of diabetes which include CAD, CVA, CKD, retinopathy, and neuropathy. These are all discussed in detail with the patient.  - I have re-counseled the patient on diet management and weight loss  by adopting a carbohydrate restricted / protein rich  Diet.  - Patient is advised to stick to a routine mealtimes to eat 3 meals  a day and avoid unnecessary snacks ( to snack only to correct hypoglycemia).  - Patient admits there is a room for improvement in his diet and drink choices. -  Suggestion is made for him to avoid simple carbohydrates  from his diet including Cakes, Sweet Desserts / Pastries, Ice Cream, Soda (diet and regular), Sweet Tea, Candies, Chips, Cookies, Store Bought Juices, Alcohol in Excess of  1-2 drinks a day, Artificial Sweeteners, and "Sugar-free" Products. This will help patient to have stable blood glucose profile and potentially avoid unintended weight gain.   - I have approached patient with the following individualized plan to manage diabetes and patient agrees.  -  He'll  continue to require basal/bolus insulin to maintain diabetes control to target.    -He is advised to insulin regimen prescribed for him and continue Lantus  70 units nightly, continue NovoLog  10 units 3 times a day before meals  for pre-meal BG readings of 90-123m/dl, plus patient specific correction dose of rapid acting insulin  for unexpected hyperglycemia above 1556mdl, associated with strict monitoring of glucose  AC and HS. - Patient is warned not to take insulin without proper monitoring  per orders. -Adjustment parameters are given for hypo and hyperglycemia in writing. -Patient is encouraged to call clinic for blood glucose levels less than 70 or above 300 mg /dl.  -He is advised to continue metformin 1000 mg p.o. twice daily-daily after breakfast and after supper.     2) Lipids/HPL: Uncontrolled with low is improving hypertriglyceridemia to 254 from 1700.    He is advised to be consistent with his Lovaza 2 g p.o. twice daily, fenofibrate 145 mg p.o. daily, and Lipitor 20 mg p.o. daily at bedtime.     I advised patient to maintain close follow up with his PCP for primary care needs. - Patient Care Time Today:  25 min, of which >50% was spent in reviewing his  current and  previous labs/studies, his  blood glucose readings, previous treatments, and medications doses and developing a plan for long-term care based on the latest recommendations for standards of care.  DaJacob Mooresarticipated in the discussions, expressed understanding, and voiced agreement with the above plans.  All questions were answered to his satisfaction. he is encouraged to contact clinic should he have any questions or concerns prior to his return visit.   Follow up plan: No follow-ups on file.  GeGlade LloydMD Phone: [phone]: [phone]-  This note was partially dictated with voice recognition software. Similar sounding words can be transcribed inadequately or may not  be corrected upon review.  09/03/2018, 9:25 AM

## 2018-09-04 ENCOUNTER — Other Ambulatory Visit: Payer: Self-pay

## 2018-09-04 MED ORDER — ATORVASTATIN CALCIUM 20 MG PO TABS: ORAL_TABLET | ORAL | 3 refills | Status: DC

## 2018-09-05 ENCOUNTER — Encounter: Payer: Self-pay | Admitting: "Endocrinology

## 2018-10-06 ENCOUNTER — Other Ambulatory Visit: Payer: Self-pay | Admitting: "Endocrinology

## 2018-10-21 ENCOUNTER — Other Ambulatory Visit: Payer: Self-pay | Admitting: Cardiovascular Disease

## 2018-10-21 MED ORDER — CARVEDILOL 12.5 MG PO TABS: 12.5000 mg | ORAL_TABLET | Freq: Two times a day (BID) | ORAL | 1 refills | Status: DC

## 2018-10-21 NOTE — Telephone Encounter (Signed)
New message      *STAT* If patient is at the pharmacy, call can be transferred to refill team.   1. Which medications need to be refilled? (please list name of each medication and dose if known) carvedilol (COREG) 12.5 MG tablet  2. Which pharmacy/location (including street and city if local pharmacy) is medication to be sent to?[facility], [place], [place]  3. Do they need a 30 day or 90 day supply? [place]

## 2018-10-22 ENCOUNTER — Other Ambulatory Visit: Payer: Self-pay

## 2018-10-22 MED ORDER — CARVEDILOL 12.5 MG PO TABS: 12.5000 mg | ORAL_TABLET | Freq: Two times a day (BID) | ORAL | 1 refills | Status: DC

## 2018-10-22 NOTE — Telephone Encounter (Signed)
REFILL 

## 2018-11-27 DIAGNOSIS — E118 Type 2 diabetes mellitus with unspecified complications: Secondary | ICD-10-CM | POA: Diagnosis not present

## 2018-11-27 DIAGNOSIS — Z6830 Body mass index (BMI) 30.0-30.9, adult: Secondary | ICD-10-CM | POA: Diagnosis not present

## 2018-11-27 DIAGNOSIS — I2510 Atherosclerotic heart disease of native coronary artery without angina pectoris: Secondary | ICD-10-CM | POA: Diagnosis not present

## 2018-11-27 DIAGNOSIS — I10 Essential (primary) hypertension: Secondary | ICD-10-CM | POA: Diagnosis not present

## 2018-11-27 DIAGNOSIS — Z1389 Encounter for screening for other disorder: Secondary | ICD-10-CM | POA: Diagnosis not present

## 2018-11-27 DIAGNOSIS — Z0001 Encounter for general adult medical examination with abnormal findings: Secondary | ICD-10-CM | POA: Diagnosis not present

## 2018-11-27 DIAGNOSIS — E6609 Other obesity due to excess calories: Secondary | ICD-10-CM | POA: Diagnosis not present

## 2018-12-02 ENCOUNTER — Ambulatory Visit: Payer: Medicare Other | Admitting: "Endocrinology

## 2018-12-24 ENCOUNTER — Other Ambulatory Visit: Payer: Self-pay | Admitting: "Endocrinology

## 2018-12-24 DIAGNOSIS — E1159 Type 2 diabetes mellitus with other circulatory complications: Secondary | ICD-10-CM | POA: Diagnosis not present

## 2018-12-25 ENCOUNTER — Other Ambulatory Visit: Payer: Self-pay | Admitting: "Endocrinology

## 2018-12-25 LAB — COMPREHENSIVE METABOLIC PANEL WITH GFR
ALT: 23 IU/L (ref 0–44)
AST: 16 IU/L (ref 0–40)
Albumin/Globulin Ratio: 2 (ref 1.2–2.2)
Albumin: 4.2 g/dL (ref 3.8–4.8)
Alkaline Phosphatase: 71 IU/L (ref 39–117)
BUN/Creatinine Ratio: 15 (ref 10–24)
BUN: 15 mg/dL (ref 8–27)
Bilirubin Total: 0.3 mg/dL (ref 0.0–1.2)
CO2: 20 mmol/L (ref 20–29)
Calcium: 9.5 mg/dL (ref 8.6–10.2)
Chloride: 104 mmol/L (ref 96–106)
Creatinine, Ser: 0.97 mg/dL (ref 0.76–1.27)
GFR calc Af Amer: 97 mL/min/1.73
GFR calc non Af Amer: 84 mL/min/1.73
Globulin, Total: 2.1 g/dL (ref 1.5–4.5)
Glucose: 196 mg/dL — ABNORMAL HIGH (ref 65–99)
Potassium: 4.5 mmol/L (ref 3.5–5.2)
Sodium: 140 mmol/L (ref 134–144)
Total Protein: 6.3 g/dL (ref 6.0–8.5)

## 2018-12-25 LAB — HGB A1C W/O EAG: Hgb A1c MFr Bld: 7.3 % — ABNORMAL HIGH (ref 4.8–5.6)

## 2018-12-31 ENCOUNTER — Other Ambulatory Visit: Payer: Self-pay

## 2018-12-31 ENCOUNTER — Ambulatory Visit: Payer: Medicare Other | Admitting: "Endocrinology

## 2018-12-31 ENCOUNTER — Encounter: Payer: Self-pay | Admitting: "Endocrinology

## 2018-12-31 VITALS — BP 122/80 | HR 79 | Ht 67.0 in | Wt 192.0 lb

## 2018-12-31 DIAGNOSIS — E782 Mixed hyperlipidemia: Secondary | ICD-10-CM

## 2018-12-31 DIAGNOSIS — E1159 Type 2 diabetes mellitus with other circulatory complications: Secondary | ICD-10-CM

## 2018-12-31 DIAGNOSIS — I10 Essential (primary) hypertension: Secondary | ICD-10-CM

## 2018-12-31 MED ORDER — FREESTYLE LIBRE 14 DAY SENSOR MISC: 1.0000 | 1 refills | Status: DC

## 2018-12-31 NOTE — Progress Notes (Signed)
12/31/2018  Endocrinology follow-up note   Subjective:    Patient ID: Francis Hall, male    DOB: 11-Mar-1956,    Past Medical History:  Diagnosis Date  . Atrial fibrillation ([place])   . CAD (coronary artery disease)    [place]  . CHF (congestive heart failure) ([place]) 11/08/2010   2D Echo EF=>55%  . Diabetes mellitus    insulin  . Diverticulosis   . Dyspnea   . Family hx of colon cancer   . GERD (gastroesophageal reflux disease)   . HTN (hypertension)   . Hyperlipemia   . Hyperplastic colon polyp 07/16/07   30cm   . Myocardial infarction ([place]) 12/2012  . OSA (obstructive sleep apnea) 12/21/2007   sleep study perform REM 29mns REM and NREM was 95.0%, AHI of 12.31hr and RDI of 12.4hr   Past Surgical History:  Procedure Laterality Date  . CARDIAC CATHETERIZATION  2006 2011   2 stents  . CATARACT EXTRACTION W/PHACO Left 12/03/2013   Procedure: CATARACT EXTRACTION PHACO AND INTRAOCULAR LENS PLACEMENT (IOC);  Surgeon: KTonny Branch MD;  Location: [facility];  Service: Ophthalmology;  Laterality: Left;  CDE:7.07  . CATARACT EXTRACTION W/PHACO Right 12/14/2013   Procedure: CATARACT EXTRACTION PHACO AND INTRAOCULAR LENS PLACEMENT (IOC);  Surgeon: KTonny Branch MD;  Location: [facility];  Service: Ophthalmology;  Laterality: Right;  CDE 7.84   . COLONOSCOPY  07/2007   Dr JArnoldo Morale hyperplastic polyp 30cm  . COLONOSCOPY N/A 08/12/2012   Procedure: COLONOSCOPY;  Surgeon: SDanie Binder MD;  Location: [facility];  Service: Endoscopy;  Laterality: N/A;  8:30  . COLONOSCOPY N/A 11/11/2017   Dr. FOneida Alar multiple tubular adenomas, next surveillance in 2022.   .Marland KitchenCORONARY ARTERY BYPASS GRAFT  06/2004   x2 performed for 40% left main stenosis, 80% proximal LAD stenosis of the first diagonal artery, and 50% left circumfles artery priximal stenosis.LIMA to LAD and free radial artery to circumflex coronary artery  . LEFT HEART CATHETERIZATION WITH CORONARY/GRAFT ANGIOGRAM  12/29/2012   Procedure:  LEFT HEART CATHETERIZATION WITH CBeatrix Fetters  Surgeon: DLeonie Man MD;  [facility];  Service: Cardiovascular;;  . POLYPECTOMY  11/11/2017   Procedure: POLYPECTOMY;  Surgeon: FDanie Binder MD;  Location: [facility];  Service: Endoscopy;;  ascending colon, transverse, splenic flexure, sigmoid  . TONSILLECTOMY  1962   Social History   Socioeconomic History  . Marital status: Married    Spouse name: Not on file  . Number of children: 2  . Years of education: 148 . Highest education level: Not on file  Occupational History  . Occupation: disabled mDealer  Social Needs  . Financial resource strain: Not on file  . Food insecurity    Worry: Not on file    Inability: Not on file  . Transportation needs    Medical: Not on file    Non-medical: Not on file  Tobacco Use  . Smoking status: Never Smoker  . Smokeless tobacco: Never Used  Substance and Sexual Activity  . Alcohol use: Yes    Comment: social-2 beers/wk  . Drug use: No  . Sexual activity: Yes    Birth control/protection: None  Lifestyle  . Physical activity    Days per week: Not on file    Minutes per session: Not on file  . Stress: Not on file  Relationships  . Social connections    Talks on phone: Not on file  Gets together: Not on file    Attends religious service: Not on file    Active member of club or organization: Not on file    Attends meetings of clubs or organizations: Not on file    Relationship status: Not on file  Other Topics Concern  . Not on file  Social History Narrative  . Not on file   Outpatient Encounter Medications as of 12/31/2018  Medication Sig  . acetaminophen (TYLENOL) 325 MG tablet Take 650 mg by mouth every 6 (six) hours as needed for moderate pain or headache.  [name] aspirin 81 MG EC tablet Take 81 mg by mouth daily.    [name] atorvastatin (LIPITOR) 20 MG tablet TAKE 1 TABLET BY MOUTH EVERYDAY AT BEDTIME  . blood glucose meter kit and supplies KIT Dispense  based on patient and insurance preference. Use up to four times daily as directed. (FOR ICD-10 E11.65)  . carvedilol (COREG) 12.5 MG tablet Take 1 tablet (12.5 mg total) by mouth 2 (two) times daily with a meal. OV NEEDED  . Cholecalciferol (VITAMIN D3) 1000 UNITS CAPS Take 2,000 Units by mouth daily.   . Continuous Blood Gluc Sensor (FREESTYLE LIBRE 14 DAY SENSOR) MISC Inject 1 each into the skin every 14 (fourteen) days. Use as directed.  . diltiazem (CARDIZEM CD) 120 MG 24 hr capsule Take 1 capsule (120 mg total) by mouth daily.  . fenofibrate (TRICOR) 48 MG tablet Take 1 tablet (48 mg total) by mouth daily.  [name] HUMALOG KWIKPEN 100 UNIT/ML KwikPen INJECT UNDER THE SKIN 10 TO 16 UNITS THREE TIMES DAILY BEFORE MEALS  . hydrochlorothiazide (MICROZIDE) 12.5 MG capsule TAKE 1 CAPSULE BY MOUTH AS  NEEDED.  [name] Insulin Glargine (LANTUS SOLOSTAR) 100 UNIT/ML Solostar Pen INJECT 70 UNITS SUBCUTANEOUSLY UNITS AT BEDTIME  . lisinopril (PRINIVIL,ZESTRIL) 20 MG tablet TAKE 1 TABLET BY MOUTH TWICE A DAY  . metFORMIN (GLUCOPHAGE) 1000 MG tablet TAKE 1 TABLET(1000 MG) BY MOUTH TWICE DAILY WITH A MEAL  . Multiple Vitamin (MULTIVITAMIN) capsule Take 1 capsule by mouth daily.    . nitroGLYCERIN (NITROSTAT) 0.4 MG SL tablet PLACE 1 TABLET UNDER TONGUE IF NEEDED FOR CHEST PAIN..MAX OF 3 DOSES  . pantoprazole (PROTONIX) 40 MG tablet Take 40 mg by mouth daily.  . vitamin C (ASCORBIC ACID) 500 MG tablet Take 500 mg by mouth daily.  . [DISCONTINUED] Continuous Blood Gluc Sensor (FREESTYLE LIBRE 14 DAY SENSOR) MISC Inject 1 each into the skin every 14 (fourteen) days. Use as directed.   No facility-administered encounter medications on file as of 12/31/2018.    ALLERGIES: Allergies  Allergen Reactions  . Crestor [Rosuvastatin] Other (See Comments)    myalgia   VACCINATION STATUS:  There is no immunization history on file for this patient.  Diabetes He presents for his follow-up diabetic visit. He has type 2  diabetes mellitus. Onset time: He was diagnosed at approximate age of 28 years. His disease course has been stable. There are no hypoglycemic associated symptoms. Pertinent negatives for hypoglycemia include no confusion, pallor or seizures. There are no diabetic associated symptoms. Pertinent negatives for diabetes include no fatigue, no polydipsia, no polyphagia, no polyuria and no weakness. There are no hypoglycemic complications. Symptoms are stable. Diabetic complications include heart disease and peripheral neuropathy. Risk factors for coronary artery disease include dyslipidemia, diabetes mellitus, hypertension and male sex. Current diabetic treatment includes intensive insulin program. His weight is fluctuating minimally. He is following a generally unhealthy diet. He has had a previous visit  with a dietitian. He participates in exercise intermittently. His home blood glucose trend is decreasing steadily. His breakfast blood glucose range is generally 180-200 mg/dl. His lunch blood glucose range is generally 140-180 mg/dl. His dinner blood glucose range is generally 140-180 mg/dl. His bedtime blood glucose range is generally 140-180 mg/dl. His overall blood glucose range is 140-180 mg/dl. An ACE inhibitor/angiotensin II receptor blocker is being taken. Eye exam is current.  Hyperlipidemia This is a chronic problem. The current episode started more than 1 year ago. The problem is controlled. Recent lipid tests were reviewed and are high. Exacerbating diseases include diabetes and obesity. Pertinent negatives include no myalgias. Current antihyperlipidemic treatment includes statins. Risk factors for coronary artery disease include diabetes mellitus, dyslipidemia, hypertension, male sex, a sedentary lifestyle, obesity and family history.    Review of Systems  Constitutional: Negative for fatigue and unexpected weight change.  HENT: Negative for dental problem, mouth sores and trouble swallowing.    Eyes: Negative for visual disturbance.  Respiratory: Negative for cough, choking, chest tightness and wheezing.   Cardiovascular: Negative for leg swelling.  Gastrointestinal: Negative for abdominal distention, abdominal pain, constipation, diarrhea, nausea and vomiting.  Endocrine: Negative for polydipsia, polyphagia and polyuria.  Genitourinary: Negative for dysuria, flank pain, hematuria and urgency.  Musculoskeletal: Negative for back pain, gait problem and myalgias.  Skin: Negative for pallor, rash and wound.  Neurological: Negative for seizures, syncope, weakness and numbness.  Psychiatric/Behavioral: Negative.  Negative for confusion and dysphoric mood.    Objective:    BP 122/80   Pulse 79   Ht _0  (1.702 m)   Wt 192 lb (87.1 kg)   BMI 30.07 kg/m   Wt Readings from Last 3 Encounters:  12/31/18 192 lb (87.1 kg)  05/06/18 201 lb (91.2 kg)  04/15/18 202 lb 3.2 oz (91.7 kg)    Physical Exam  Constitutional: He is oriented to person, place, and time. He appears well-developed. He is cooperative. No distress.  HENT:  Head: Normocephalic and atraumatic.  Eyes: EOM are normal.  Neck: Normal range of motion. Neck supple. No tracheal deviation present. No thyromegaly present.  Cardiovascular: Normal rate, S1 normal and S2 normal. Exam reveals no gallop.  No murmur heard. Pulses:      Dorsalis pedis pulses are 1+ on the right side and 1+ on the left side.       Posterior tibial pulses are 1+ on the right side and 1+ on the left side.  Pulmonary/Chest: Effort normal. No respiratory distress. He has no wheezes.  Abdominal: He exhibits no distension. There is no abdominal tenderness. There is no guarding and no CVA tenderness.  Musculoskeletal:        General: No edema.     Right shoulder: He exhibits no swelling and no deformity.  Neurological: He is alert and oriented to person, place, and time. He has normal strength and normal reflexes. No cranial nerve deficit or sensory  deficit. Gait normal.  Skin: Skin is warm and dry. No rash noted. No cyanosis. Nails show no clubbing.  Psychiatric: He has a normal mood and affect. His speech is normal. Judgment normal. Cognition and memory are normal.    Results for orders placed or performed in visit on 12/24/18  Comprehensive metabolic panel  Result Value Ref Range   Glucose 196 (H) 65 - 99 mg/dL   BUN 15 8 - 27 mg/dL   Creatinine, Ser 0.97 0.76 - 1.27 mg/dL   GFR calc non Af Wyvonnia Lora  84 >59 mL/min/1.73   GFR calc Af Amer 97 >59 mL/min/1.73   BUN/Creatinine Ratio 15 10 - 24   Sodium 140 134 - 144 mmol/L   Potassium 4.5 3.5 - 5.2 mmol/L   Chloride 104 96 - 106 mmol/L   CO2 20 20 - 29 mmol/L   Calcium 9.5 8.6 - 10.2 mg/dL   Total Protein 6.3 6.0 - 8.5 g/dL   Albumin 4.2 3.8 - 4.8 g/dL   Globulin, Total 2.1 1.5 - 4.5 g/dL   Albumin/Globulin Ratio 2.0 1.2 - 2.2   Bilirubin Total 0.3 0.0 - 1.2 mg/dL   Alkaline Phosphatase 71 39 - 117 IU/L   AST 16 0 - 40 IU/L   ALT 23 0 - 44 IU/L  Hgb A1c w/o eAG  Result Value Ref Range   Hgb A1c MFr Bld 7.3 (H) 4.8 - 5.6 %   Diabetic Labs (most recent): Lab Results  Component Value Date   HGBA1C 7.3 (H) 12/24/2018   HGBA1C 7.5 (H) 08/29/2018   HGBA1C 7.3 (H) 04/29/2018   Lipid Panel     Component Value Date/Time   CHOL 159 12/24/2017 0952   TRIG 254 (H) 12/24/2017 0952   HDL 34 (L) 12/24/2017 0952   CHOLHDL 4.8 01/22/2017 1039   CHOLHDL 7.0 12/29/2012 0438   VLDL UNABLE TO CALCULATE IF TRIGLYCERIDE OVER 400 mg/dL 12/29/2012 0438   LDLCALC 74 12/24/2017 0952     Assessment & Plan:   1. Type 2 diabetes mellitus with vascular disease ([place])  His diabetes is  complicated by coronary artery disease status post CABG in 2006 and recent stent placement in 2014. Patient is reporting above target fasting blood glucose profile, A1c of 7.3%.   He is fasting hyperglycemia is due to the fact that he lowered his Lantus to  60 units instead of 70 units prescribed.      Glucose  logs and insulin administration records pertaining to this visit,  to be scanned into patient's records.    -Recent labs reviewed. - Francis Hall remains at a high risk for more acute and chronic complications of diabetes which include CAD, CVA, CKD, retinopathy, and neuropathy. These are all discussed in detail with the patient.  - I have re-counseled the patient on diet management and weight loss  by adopting a carbohydrate restricted / protein rich  Diet.  - Patient is advised to stick to a routine mealtimes to eat 3 meals  a day and avoid unnecessary snacks ( to snack only to correct hypoglycemia).  - he  admits there is a room for improvement in his diet and drink choices. -  Suggestion is made for him to avoid simple carbohydrates  from his diet including Cakes, Sweet Desserts / Pastries, Ice Cream, Soda (diet and regular), Sweet Tea, Candies, Chips, Cookies, Sweet Pastries,  Store Bought Juices, Alcohol in Excess of  1-2 drinks a day, Artificial Sweeteners, Coffee Creamer, and "Sugar-free" Products. This will help patient to have stable blood glucose profile and potentially avoid unintended weight gain.   - I have approached patient with the following individualized plan to manage diabetes and patient agrees.  -  He'll continue to require basal/bolus insulin to maintain diabetes control to target.    -He is advised to follow insulin regimen prescribed for him, and resume Lantus 70  units nightly, continue NovoLog  10 units 3 times a day before meals  for pre-meal BG readings of 90-118m/dl, plus patient specific correction dose of rapid acting  insulin  for unexpected hyperglycemia above 136m/dl, associated with strict monitoring of glucose  AC and HS. - Patient is warned not to take insulin without proper monitoring per orders. -Adjustment parameters are given for hypo and hyperglycemia in writing. -Patient is encouraged to call clinic for blood glucose levels less than 70 or above 300 mg  /dl.  -He is advised to continue metformin 1000 mg p.o. twice daily-daily after breakfast and after supper.   -Given the fact that he is requiring multiple daily injections of insulin associated with multiple daily monitoring of blood glucose, he would greatly benefit from a CGM device.  I discussed and prescribed CGM device for him.   2) Lipids/HPL: Uncontrolled with low is improving hypertriglyceridemia to 254 from 1700.    He is advised to be consistent with his Lovaza 2 g p.o. twice daily, fenofibrate 145 mg p.o. daily, and Lipitor 20 mg p.o. daily at bedtime.    3) hypertension: Controlled to target.  He is currently on lisinopril 20 mg p.o. daily, Cardizem 120 mg p.o. daily, carvedilol 12.5 mg p.o. twice daily. I advised patient to maintain close follow up with his PCP for primary care needs.   - Patient Care Time Today:  25 min, of which >50% was spent in  counseling and the rest reviewing his  current and  previous labs/studies, previous treatments, his blood glucose readings, and medications' doses and developing a plan for long-term care based on the latest recommendations for standards of care.   Francis Mooresparticipated in the discussions, expressed understanding, and voiced agreement with the above plans.  All questions were answered to his satisfaction. he is encouraged to contact clinic should he have any questions or concerns prior to his return visit.  Follow up plan: Return in about 4 months (around 05/03/2019), or Request for his labs from Dr. GHilma Favors for Follow up with Pre-visit Labs, Meter, and Logs.  Francis Lloyd MD Phone: [phone] Fax: [phone] -  This note was partially dictated with voice recognition software. Similar sounding words can be transcribed inadequately or may not  be corrected upon review.  12/31/2018, 10:39 PM

## 2018-12-31 NOTE — Patient Instructions (Signed)
                                     Advice for Weight Management  -For most of us the best way to lose weight is by diet management. Generally speaking, diet management means consuming less calories intentionally which over time brings about progressive weight loss.  This can be achieved more effectively by restricting carbohydrate consumption to the minimum possible.  So, it is critically important to know your numbers: how much calorie you are consuming and how much calorie you need. More importantly, our carbohydrates sources should be unprocessed or minimally processed complex starch food items.   Sometimes, it is important to balance nutrition by increasing protein intake (animal or plant source), fruits, and vegetables.  -Sticking to a routine mealtime to eat 3 meals a day and avoiding unnecessary snacks is shown to have a big role in weight control. Under normal circumstances, the only time we lose real weight is when we are hungry, so allow hunger to take place- hunger means no food between meal times, only water.  It is not advisable to starve.   -It is better to avoid simple carbohydrates including: Cakes, Sweet Desserts, Ice Cream, Soda (diet and regular), Sweet Tea, Candies, Chips, Cookies, Store Bought Juices, Alcohol in Excess of  1-2 drinks a day, Artificial Sweeteners, Doughnuts, Coffee Creamers, "Sugar-free" Products, etc, etc.  This is not a complete list.....    -Consulting with certified diabetes educators is proven to provide you with the most accurate and current information on diet.  Also, you may be  interested in discussing diet options/exchanges , we can schedule a visit with Francis Hall, RDN, CDE for individualized nutrition education.  -Exercise: If you are able: 30 -60 minutes a day ,4 days a week, or 150 minutes a week.  The longer the better.  Combine stretch, strength, and aerobic activities.  If you were told in the past that you  have high risk for cardiovascular diseases, you may seek evaluation by your heart doctor prior to initiating moderate to intense exercise programs.                                  Additional Care Considerations for Diabetes   -Diabetes  is a chronic disease.  The most important care consideration is regular follow-up with your diabetes care provider with the goal being avoiding or delaying its complications and to take advantage of advances in medications and technology.    -Type 2 diabetes is known to coexist with other important comorbidities such as high blood pressure and high cholesterol.  It is critical to control not only the diabetes but also the high blood pressure and high cholesterol to minimize and delay the risk of complications including coronary artery disease, stroke, amputations, blindness, etc.    - Studies showed that people with diabetes will benefit from a class of medications known as ACE inhibitors and statins.  Unless there are specific reasons not to be on these medications, the standard of care is to consider getting one from these groups of medications at an optimal doses.  These medications are generally considered safe and proven to help protect the heart and the kidneys.    - People with diabetes are encouraged to initiate and maintain regular follow-up with eye doctors, foot doctors, dentists ,   and if necessary heart and kidney doctors.     - It is highly recommended that people with diabetes quit smoking or stay away from smoking, and get yearly  flu vaccine and pneumonia vaccine at least every 5 years.  One other important lifestyle recommendation is to ensure adequate sleep - at least 7 hours of uninterrupted sleep at night.  -Exercise: If you are able: 30 -60 minutes a day, 4 days a week, or 150 minutes a week.  The longer the better.  Combine stretch, strength, and aerobic activities.  If you were told in the past that you have high risk for cardiovascular  diseases, you may seek evaluation by your heart doctor prior to initiating moderate to intense exercise programs.          

## 2019-01-06 ENCOUNTER — Other Ambulatory Visit: Payer: Self-pay | Admitting: "Endocrinology

## 2019-01-12 ENCOUNTER — Other Ambulatory Visit: Payer: Self-pay | Admitting: *Deleted

## 2019-01-12 MED ORDER — CARVEDILOL 12.5 MG PO TABS: 12.5000 mg | ORAL_TABLET | Freq: Two times a day (BID) | ORAL | 1 refills | Status: DC

## 2019-01-13 ENCOUNTER — Other Ambulatory Visit: Payer: Self-pay

## 2019-01-13 ENCOUNTER — Other Ambulatory Visit: Payer: Self-pay | Admitting: Cardiovascular Disease

## 2019-01-13 MED ORDER — CARVEDILOL 12.5 MG PO TABS: 12.5000 mg | ORAL_TABLET | Freq: Two times a day (BID) | ORAL | 0 refills | Status: DC

## 2019-01-13 NOTE — Telephone Encounter (Signed)
Pt overdue for 6 month f/u. Please contact pt for future appointment. Pt needing refills. 

## 2019-02-05 ENCOUNTER — Other Ambulatory Visit: Payer: Self-pay

## 2019-02-05 MED ORDER — HYDROCHLOROTHIAZIDE 12.5 MG PO CAPS: ORAL_CAPSULE | ORAL | 0 refills | Status: DC

## 2019-04-03 ENCOUNTER — Encounter: Payer: Self-pay | Admitting: Cardiovascular Disease

## 2019-04-03 ENCOUNTER — Ambulatory Visit (INDEPENDENT_AMBULATORY_CARE_PROVIDER_SITE_OTHER): Payer: Medicare Other | Admitting: Cardiovascular Disease

## 2019-04-03 ENCOUNTER — Other Ambulatory Visit: Payer: Self-pay

## 2019-04-03 VITALS — BP 162/79 | HR 75 | Ht 67.0 in | Wt 192.0 lb

## 2019-04-03 DIAGNOSIS — E782 Mixed hyperlipidemia: Secondary | ICD-10-CM | POA: Diagnosis not present

## 2019-04-03 DIAGNOSIS — E118 Type 2 diabetes mellitus with unspecified complications: Secondary | ICD-10-CM

## 2019-04-03 DIAGNOSIS — I5032 Chronic diastolic (congestive) heart failure: Secondary | ICD-10-CM | POA: Diagnosis not present

## 2019-04-03 DIAGNOSIS — I25810 Atherosclerosis of coronary artery bypass graft(s) without angina pectoris: Secondary | ICD-10-CM

## 2019-04-03 DIAGNOSIS — I10 Essential (primary) hypertension: Secondary | ICD-10-CM | POA: Diagnosis not present

## 2019-04-03 DIAGNOSIS — Z794 Long term (current) use of insulin: Secondary | ICD-10-CM

## 2019-04-03 NOTE — Patient Instructions (Signed)
Medication Instructions:  No changes   *If you need a refill on your cardiac medications before your next appointment, please call your pharmacy*  Lab Work: None ordered If you have labs (blood work) drawn today and your tests are completely normal, you will receive your results only by: [name] MyChart Message (if you have MyChart) OR . A paper copy in the mail If you have any lab test that is abnormal or we need to change your treatment, we will call you to review the results.  Testing/Procedures: None ordered  Follow-Up: At [facility], you and your health needs are our priority.  As part of our continuing mission to provide you with exceptional heart care, we have created designated Provider Care Teams.  These Care Teams include your primary Cardiologist (physician) and Advanced Practice Providers (APPs -  Physician Assistants and Nurse Practitioners) who all work together to provide you with the care you need, when you need it.  Your next appointment:   12 months  The format for your next appointment:   In Person  Provider:   Dr. Sallyanne Kuster

## 2019-04-03 NOTE — Progress Notes (Signed)
Cardiology Office Note    Date:  04/05/2019   ID:  Chasin, Findling 19-Nov-1955, MRN [id number]  PCP:  Sharilyn Sites, MD  Cardiologist:   Sanda Klein, MD   Chief complaint: CAD  History of Present Illness:  Francis Hall is a 63 y.o. male with coronary artery disease and previous bypass surgery, diastolic heart failure, insulin-requiring diabetes mellitus, hypertension, mixed hyperlipidemia with severe hypertriglyceridemia.  He feels better than last year and is fitter. He has lost weight. Continues to walk regularly. He had a persistent cough earlier this year that only resolved after a steroid shot.  A1c is a little higher though, at 7.3%. TG are high at 408 and HDL is low at 27. Total cholesterol 179. Difficult to calculate LDL; desirable range is <70.  The patient specifically denies any chest pain at rest or with exertion, dyspnea at rest or with exertion, orthopnea, paroxysmal nocturnal dyspnea, syncope, palpitations, focal neurological deficits, intermittent claudication, lower extremity edema, unexplained weight gain, cough, hemoptysis or wheezing.  Akeen had multivessel bypass surgery in 2006 (LIMA to LAD, free radial to circumflex) and in 2014 presented with a small non-STEMI and required percutaneous revascularization (proximal posterolateral artery 2.25 x 16 mm Promus and mid right coronary artery 2.5 x 38 mm Promus and balloon angioplasty of the ostium of the PDA). He is a "Plavix nonresponder", on ticagrelor chronically.  Past Medical History:  Diagnosis Date  . Atrial fibrillation ([place])   . CAD (coronary artery disease)    [facility]  . CHF (congestive heart failure) ([place]) 11/08/2010   2D Echo EF=>55%  . Diabetes mellitus    insulin  . Diverticulosis   . Dyspnea   . Family hx of colon cancer   . GERD (gastroesophageal reflux disease)   . HTN (hypertension)   . Hyperlipemia   . Hyperplastic colon polyp 07/16/07   30cm   . Myocardial infarction ([place]) 12/2012  .  OSA (obstructive sleep apnea) 12/21/2007   sleep study perform REM 36mns REM and NREM was 95.0%, AHI of 12.31hr and RDI of 12.4hr    Past Surgical History:  Procedure Laterality Date  . CARDIAC CATHETERIZATION  2006 2011   2 stents  . CATARACT EXTRACTION W/PHACO Left 12/03/2013   Procedure: CATARACT EXTRACTION PHACO AND INTRAOCULAR LENS PLACEMENT (IOC);  Surgeon: KTonny Branch MD;  Location: [facility];  Service: Ophthalmology;  Laterality: Left;  CDE:7.07  . CATARACT EXTRACTION W/PHACO Right 12/14/2013   Procedure: CATARACT EXTRACTION PHACO AND INTRAOCULAR LENS PLACEMENT (IOC);  Surgeon: KTonny Branch MD;  Location: [facility];  Service: Ophthalmology;  Laterality: Right;  CDE 7.84   . COLONOSCOPY  07/2007   Dr JArnoldo Morale hyperplastic polyp 30cm  . COLONOSCOPY N/A 08/12/2012   Procedure: COLONOSCOPY;  Surgeon: SDanie Binder MD;  Location: [facility];  Service: Endoscopy;  Laterality: N/A;  8:30  . COLONOSCOPY N/A 11/11/2017   Dr. FOneida Alar multiple tubular adenomas, next surveillance in 2022.   .Marland KitchenCORONARY ARTERY BYPASS GRAFT  06/2004   x2 performed for 40% left main stenosis, 80% proximal LAD stenosis of the first diagonal artery, and 50% left circumfles artery priximal stenosis.LIMA to LAD and free radial artery to circumflex coronary artery  . LEFT HEART CATHETERIZATION WITH CORONARY/GRAFT ANGIOGRAM  12/29/2012   Procedure: LEFT HEART CATHETERIZATION WITH CBeatrix Fetters  Surgeon: DLeonie Man MD;  [facility];  Service: Cardiovascular;;  . POLYPECTOMY  11/11/2017   Procedure: POLYPECTOMY;  Surgeon: FDanie Binder MD;  Location: [facility];  Service: Endoscopy;;  ascending colon, transverse, splenic flexure, sigmoid  . TONSILLECTOMY  1962    Current Medications: Outpatient Medications Prior to Visit  Medication Sig Dispense Refill  . acetaminophen (TYLENOL) 325 MG tablet Take 650 mg by mouth every 6 (six) hours as needed for moderate pain or headache.    [name] aspirin 81 MG  EC tablet Take 81 mg by mouth daily.      [name] atorvastatin (LIPITOR) 20 MG tablet TAKE 1 TABLET BY MOUTH EVERYDAY AT BEDTIME 90 tablet 3  . carvedilol (COREG) 12.5 MG tablet Take 1 tablet (12.5 mg total) by mouth 2 (two) times daily with a meal. KEEP OV. 180 tablet 0  . Cholecalciferol (VITAMIN D3) 1000 UNITS CAPS Take 2,000 Units by mouth daily.     . hydrochlorothiazide (MICROZIDE) 12.5 MG capsule TAKE 1 CAPSULE BY MOUTH AS  NEEDED. 90 capsule 0  . Insulin Glargine (LANTUS SOLOSTAR) 100 UNIT/ML Solostar Pen ADMINISTER 70 UNITS UNDER THE SKIN AT BEDTIME 15 mL 2  . lisinopril (PRINIVIL,ZESTRIL) 20 MG tablet TAKE 1 TABLET BY MOUTH TWICE A DAY 180 tablet 0  . metFORMIN (GLUCOPHAGE) 1000 MG tablet TAKE 1 TABLET(1000 MG) BY MOUTH TWICE DAILY WITH A MEAL 180 tablet 0  . Multiple Vitamin (MULTIVITAMIN) capsule Take 1 capsule by mouth daily.      . nitroGLYCERIN (NITROSTAT) 0.4 MG SL tablet PLACE 1 TABLET UNDER TONGUE IF NEEDED FOR CHEST PAIN.[name] OF 3 DOSES 25 tablet 0  . pantoprazole (PROTONIX) 40 MG tablet Take 40 mg by mouth daily.    . vitamin C (ASCORBIC ACID) 500 MG tablet Take 500 mg by mouth daily.    . blood glucose meter kit and supplies KIT Dispense based on patient and insurance preference. Use up to four times daily as directed. (FOR ICD-10 E11.65) 1 each 5  . Continuous Blood Gluc Sensor (FREESTYLE LIBRE 14 DAY SENSOR) MISC Inject 1 each into the skin every 14 (fourteen) days. Use as directed. 6 each 1  . diltiazem (CARDIZEM CD) 120 MG 24 hr capsule Take 1 capsule (120 mg total) by mouth daily. 90 capsule 1  . fenofibrate (TRICOR) 48 MG tablet Take 1 tablet (48 mg total) by mouth daily. 90 tablet 0  . HUMALOG KWIKPEN 100 UNIT/ML KwikPen INJECT UNDER THE SKIN 10 TO 16 UNITS THREE TIMES DAILY BEFORE MEALS 45 mL 0   No facility-administered medications prior to visit.      Allergies:   Crestor [rosuvastatin]   Social History   Socioeconomic History  . Marital status: Married    Spouse  name: Not on file  . Number of children: 2  . Years of education: 58  . Highest education level: Not on file  Occupational History  . Occupation: disabled Dealer   Social Needs  . Financial resource strain: Not on file  . Food insecurity    Worry: Not on file    Inability: Not on file  . Transportation needs    Medical: Not on file    Non-medical: Not on file  Tobacco Use  . Smoking status: Never Smoker  . Smokeless tobacco: Never Used  Substance and Sexual Activity  . Alcohol use: Yes    Comment: social-2 beers/wk  . Drug use: No  . Sexual activity: Yes    Birth control/protection: None  Lifestyle  . Physical activity    Days per week: Not on file    Minutes per session: Not on file  .  Stress: Not on file  Relationships  . Social Herbalist on phone: Not on file    Gets together: Not on file    Attends religious service: Not on file    Active member of club or organization: Not on file    Attends meetings of clubs or organizations: Not on file    Relationship status: Not on file  Other Topics Concern  . Not on file  Social History Narrative  . Not on file     Family History:  The patient's family history includes Arthritis in an other family member; Colon cancer in his father; Coronary artery disease (age of onset: 43) in his father; Diabetes in an other family member; Heart disease in an other family member; Lung cancer in his father.   ROS:   Please see the history of present illness.    ROS All other systems are reviewed and are negative.   PHYSICAL EXAM:   VS:  BP (!) 162/79   Pulse 75   Ht '5\' 7"'  (1.702 m)   Wt 192 lb (87.1 kg)   BMI 30.07 kg/m     General: Alert, oriented x3, no distress, borderline obese Head: no evidence of trauma, PERRL, EOMI, no exophtalmos or lid lag, no myxedema, no xanthelasma; normal ears, nose and oropharynx Neck: normal jugular venous pulsations and no hepatojugular reflux; brisk carotid pulses without delay and  no carotid bruits Chest: clear to auscultation, no signs of consolidation by percussion or palpation, normal fremitus, symmetrical and full respiratory excursions Cardiovascular: normal position and quality of the apical impulse, regular rhythm, normal first and second heart sounds, no murmurs, rubs or gallops Abdomen: no tenderness or distention, no masses by palpation, no abnormal pulsatility or arterial bruits, normal bowel sounds, no hepatosplenomegaly Extremities: no clubbing, cyanosis or edema; 2+ radial, ulnar and brachial pulses bilaterally; 2+ right femoral, posterior tibial and dorsalis pedis pulses; 2+ left femoral, posterior tibial and dorsalis pedis pulses; no subclavian or femoral bruits Neurological: grossly nonfocal Psych: Normal mood and affect   Wt Readings from Last 3 Encounters:  04/03/19 192 lb (87.1 kg)  12/31/18 192 lb (87.1 kg)  05/06/18 201 lb (91.2 kg)      Studies/Labs Reviewed:   EKG:  EKG is ordered today.  The ekg ordered today NSR, nonspecific T wave changes, QTC 428 ms Recent Labs: 12/24/2018: ALT 23; BUN 15; Creatinine, Ser 0.97; Potassium 4.5; Sodium 140   Lipid Panel    Component Value Date/Time   CHOL 159 12/24/2017 0952   TRIG 254 (H) 12/24/2017 0952   HDL 34 (L) 12/24/2017 0952   CHOLHDL 4.8 01/22/2017 1039   CHOLHDL 7.0 12/29/2012 0438   VLDL UNABLE TO CALCULATE IF TRIGLYCERIDE OVER 400 mg/dL 12/29/2012 0438   LDLCALC 74 12/24/2017 0952   11/27/2018 Chol 179, HDL 27, TG 408 Creat 0.97 12/24/2018 A1c 7.3%  Additional studies/ records that were reviewed today include:  Notes from Dr. Dorris Fetch  ASSESSMENT:    1. Chronic diastolic heart failure ([place])   2. Coronary artery disease involving coronary bypass graft of native heart without angina pectoris   3. Essential hypertension   4. Mixed hyperlipidemia   5. Type 2 diabetes mellitus with complication, with long-term current use of insulin ([facility])      PLAN:  In order of problems listed  above:  1. CHF: asymptomatic, NYHA class I. Euvolemic without loop diuretics.  2. CAD s/p CABG 2006, PCI-RCA 2014: Asymptomatic. On ASA. He was  a Plavix nonresponder. 3. HTN: Uncharacteristic elevation in BP today. At home is 120s/70s. 122/80 at his 12/31/2018 appt. No change to meds. 4. HLP: On statin + fibrate + omega-3 FFA. Did not tolerate rosuvastatin. LDL was acceptable at 74 on this regimen a year ago. TG have been over 1000 in the past, lowest I have seen was 254. 5. DM: glucose control has deteriorated, which probably explains the increased TG.  He is on Insulin. I think SGLT-2 inhibitors would be a great addition to help his DM control and improve CV prognosis. Will ask Dr. Liliane Channel opinion.    Medication Adjustments/Labs and Tests Ordered: Current medicines are reviewed at length with the patient today.  Concerns regarding medicines are outlined above.  Medication changes, Labs and Tests ordered today are listed in the Patient Instructions below. Patient Instructions  Medication Instructions:  No changes   *If you need a refill on your cardiac medications before your next appointment, please call your pharmacy*  Lab Work: None ordered If you have labs (blood work) drawn today and your tests are completely normal, you will receive your results only by: [name] MyChart Message (if you have MyChart) OR . A paper copy in the mail If you have any lab test that is abnormal or we need to change your treatment, we will call you to review the results.  Testing/Procedures: None ordered  Follow-Up: At [facility], you and your health needs are our priority.  As part of our continuing mission to provide you with exceptional heart care, we have created designated Provider Care Teams.  These Care Teams include your primary Cardiologist (physician) and Advanced Practice Providers (APPs -  Physician Assistants and Nurse Practitioners) who all work together to provide you with the care you need,  when you need it.  Your next appointment:   12 months  The format for your next appointment:   In Person  Provider:   Dr. Sallyanne Kuster      Signed, Sanda Klein, MD  04/05/2019 10:32 AM    [address], [place], [place]  [address] Phone: [phone]; Fax: [phone]

## 2019-04-04 ENCOUNTER — Other Ambulatory Visit: Payer: Self-pay | Admitting: "Endocrinology

## 2019-04-05 ENCOUNTER — Encounter: Payer: Self-pay | Admitting: Cardiovascular Disease

## 2019-04-27 ENCOUNTER — Other Ambulatory Visit: Payer: Self-pay | Admitting: "Endocrinology

## 2019-04-27 DIAGNOSIS — E1159 Type 2 diabetes mellitus with other circulatory complications: Secondary | ICD-10-CM | POA: Diagnosis not present

## 2019-04-27 DIAGNOSIS — Z23 Encounter for immunization: Secondary | ICD-10-CM | POA: Diagnosis not present

## 2019-04-28 ENCOUNTER — Other Ambulatory Visit: Payer: Self-pay | Admitting: *Deleted

## 2019-04-28 LAB — COMPREHENSIVE METABOLIC PANEL
ALT: 23 IU/L (ref 0–44)
AST: 20 IU/L (ref 0–40)
Albumin/Globulin Ratio: 2 (ref 1.2–2.2)
Albumin: 4.2 g/dL (ref 3.8–4.8)
Alkaline Phosphatase: 86 IU/L (ref 39–117)
BUN/Creatinine Ratio: 14 (ref 10–24)
BUN: 14 mg/dL (ref 8–27)
Bilirubin Total: 0.5 mg/dL (ref 0.0–1.2)
CO2: 24 mmol/L (ref 20–29)
Calcium: 9.2 mg/dL (ref 8.6–10.2)
Chloride: 101 mmol/L (ref 96–106)
Creatinine, Ser: 1.01 mg/dL (ref 0.76–1.27)
GFR calc Af Amer: 92 mL/min/{1.73_m2} (ref >59)
GFR calc non Af Amer: 79 mL/min/{1.73_m2} (ref >59)
Globulin, Total: 2.1 g/dL (ref 1.5–4.5)
Glucose: 101 mg/dL — ABNORMAL HIGH (ref 65–99)
Potassium: 4.5 mmol/L (ref 3.5–5.2)
Sodium: 139 mmol/L (ref 134–144)
Total Protein: 6.3 g/dL (ref 6.0–8.5)

## 2019-04-28 LAB — HGB A1C W/O EAG: Hgb A1c MFr Bld: 6.7 % — ABNORMAL HIGH (ref 4.8–5.6)

## 2019-04-28 NOTE — Patient Outreach (Signed)
[facility]) Care Management  04/28/2019  Francis Hall 1955/08/18 [id number]  [name] was seen at the PCP's office on 04/27/2019 for a retinal screening.

## 2019-05-04 ENCOUNTER — Other Ambulatory Visit: Payer: Self-pay

## 2019-05-04 MED ORDER — HYDROCHLOROTHIAZIDE 12.5 MG PO CAPS: ORAL_CAPSULE | ORAL | 1 refills | Status: DC

## 2019-05-05 ENCOUNTER — Encounter: Payer: Self-pay | Admitting: "Endocrinology

## 2019-05-05 ENCOUNTER — Other Ambulatory Visit: Payer: Self-pay

## 2019-05-05 ENCOUNTER — Ambulatory Visit (INDEPENDENT_AMBULATORY_CARE_PROVIDER_SITE_OTHER): Payer: Medicare Other | Admitting: "Endocrinology

## 2019-05-05 DIAGNOSIS — E1159 Type 2 diabetes mellitus with other circulatory complications: Secondary | ICD-10-CM

## 2019-05-05 DIAGNOSIS — I10 Essential (primary) hypertension: Secondary | ICD-10-CM

## 2019-05-05 DIAGNOSIS — E782 Mixed hyperlipidemia: Secondary | ICD-10-CM

## 2019-05-05 MED ORDER — METFORMIN HCL 1000 MG PO TABS: ORAL_TABLET | ORAL | 0 refills | Status: DC

## 2019-05-05 MED ORDER — LANTUS SOLOSTAR 100 UNIT/ML ~~LOC~~ SOPN: PEN_INJECTOR | SUBCUTANEOUS | 2 refills | Status: DC

## 2019-05-05 NOTE — Progress Notes (Signed)
05/05/2019                                                     Endocrinology Telehealth Visit Follow up Note -During COVID -19 Pandemic  This visit type was conducted due to national recommendations for restrictions regarding the COVID-19 Pandemic  in an effort to limit this patient's exposure and mitigate transmission of the corona virus.  Due to his co-morbid illnesses, Francis Hall is at  moderate to high risk for complications without adequate follow up.  This format is felt to be most appropriate for him at this time.  I connected with this patient on 05/05/2019   by telephone and verified that I am speaking with the correct person using two identifiers. Francis Hall, 1955-08-28. he has verbally consented to this visit. All issues noted in this document were discussed and addressed. The format was not optimal for physical exam.    Subjective:    Patient ID: Francis Hall, male    DOB: January 12, 1956,    Past Medical History:  Diagnosis Date  . Atrial fibrillation ([place])   . CAD (coronary artery disease)    [place]  . CHF (congestive heart failure) ([place]) 11/08/2010   2D Echo EF=>55%  . Diabetes mellitus    insulin  . Diverticulosis   . Dyspnea   . Family hx of colon cancer   . GERD (gastroesophageal reflux disease)   . HTN (hypertension)   . Hyperlipemia   . Hyperplastic colon polyp 07/16/07   30cm   . Myocardial infarction ([place]) 12/2012  . OSA (obstructive sleep apnea) 12/21/2007   sleep study perform REM 43mns REM and NREM was 95.0%, AHI of 12.31hr and RDI of 12.4hr   Past Surgical History:  Procedure Laterality Date  . CARDIAC CATHETERIZATION  2006 2011   2 stents  . CATARACT EXTRACTION W/PHACO Left 12/03/2013   Procedure: CATARACT EXTRACTION PHACO AND INTRAOCULAR LENS PLACEMENT (IOC);  Surgeon: KTonny Branch MD;  Location: [facility] ORS;  Service: Ophthalmology;  Laterality: Left;  CDE:7.07  . CATARACT EXTRACTION W/PHACO Right 12/14/2013   Procedure: CATARACT EXTRACTION  PHACO AND INTRAOCULAR LENS PLACEMENT (IOC);  Surgeon: KTonny Branch MD;  Location: [facility] ORS;  Service: Ophthalmology;  Laterality: Right;  CDE 7.84   . COLONOSCOPY  07/2007   Dr JArnoldo Morale hyperplastic polyp 30cm  . COLONOSCOPY N/A 08/12/2012   Procedure: COLONOSCOPY;  Surgeon: SDanie Binder MD;  Location: [facility] ENDO SUITE;  Service: Endoscopy;  Laterality: N/A;  8:30  . COLONOSCOPY N/A 11/11/2017   Dr. FOneida Alar multiple tubular adenomas, next surveillance in 2022.   .Marland KitchenCORONARY ARTERY BYPASS GRAFT  06/2004   x2 performed for 40% left main stenosis, 80% proximal LAD stenosis of the first diagonal artery, and 50% left circumfles artery priximal stenosis.LIMA to LAD and free radial artery to circumflex coronary artery  . LEFT HEART CATHETERIZATION WITH CORONARY/GRAFT ANGIOGRAM  12/29/2012   Procedure: LEFT HEART CATHETERIZATION WITH CBeatrix Fetters  Surgeon: DLeonie Man MD;  [facility];  Service: Cardiovascular;;  . POLYPECTOMY  11/11/2017   Procedure: POLYPECTOMY;  Surgeon: FDanie Binder MD;  Location: [facility] SU[facility];  Service: Endoscopy;;  ascending colon, transverse, splenic flexure, sigmoid  . TONSILLECTOMY  1962   Social History   Socioeconomic History  .  Marital status: Married    Spouse name: Not on file  . Number of children: 2  . Years of education: 64  . Highest education level: Not on file  Occupational History  . Occupation: disabled Dealer   Social Needs  . Financial resource strain: Not on file  . Food insecurity    Worry: Not on file    Inability: Not on file  . Transportation needs    Medical: Not on file    Non-medical: Not on file  Tobacco Use  . Smoking status: Never Smoker  . Smokeless tobacco: Never Used  Substance and Sexual Activity  . Alcohol use: Yes    Comment: social-2 beers/wk  . Drug use: No  . Sexual activity: Yes    Birth control/protection: None  Lifestyle  . Physical activity    Days per week: Not on file    Minutes per  session: Not on file  . Stress: Not on file  Relationships  . Social Herbalist on phone: Not on file    Gets together: Not on file    Attends religious service: Not on file    Active member of club or organization: Not on file    Attends meetings of clubs or organizations: Not on file    Relationship status: Not on file  Other Topics Concern  . Not on file  Social History Narrative  . Not on file   Outpatient Encounter Medications as of 05/05/2019  Medication Sig  . acetaminophen (TYLENOL) 325 MG tablet Take 650 mg by mouth every 6 (six) hours as needed for moderate pain or headache.  [name] aspirin 81 MG EC tablet Take 81 mg by mouth daily.    [name] atorvastatin (LIPITOR) 20 MG tablet TAKE 1 TABLET BY MOUTH EVERYDAY AT BEDTIME  . blood glucose meter kit and supplies KIT Dispense based on patient and insurance preference. Use up to four times daily as directed. (FOR ICD-10 E11.65)  . carvedilol (COREG) 12.5 MG tablet Take 1 tablet (12.5 mg total) by mouth 2 (two) times daily with a meal. KEEP OV.  [name] Cholecalciferol (VITAMIN D3) 1000 UNITS CAPS Take 2,000 Units by mouth daily.   . Continuous Blood Gluc Sensor (FREESTYLE LIBRE 14 DAY SENSOR) MISC Inject 1 each into the skin every 14 (fourteen) days. Use as directed.  . diltiazem (CARDIZEM CD) 120 MG 24 hr capsule Take 1 capsule (120 mg total) by mouth daily.  . fenofibrate (TRICOR) 48 MG tablet Take 1 tablet (48 mg total) by mouth daily.  [name] HUMALOG KWIKPEN 100 UNIT/ML KwikPen INJECT UNDER THE SKIN 10 TO 16 UNITS THREE TIMES DAILY BEFORE MEALS  . hydrochlorothiazide (MICROZIDE) 12.5 MG capsule TAKE 1 CAPSULE BY MOUTH AS  NEEDED.  [name] Insulin Glargine (LANTUS SOLOSTAR) 100 UNIT/ML Solostar Pen ADMINISTER 70 UNITS UNDER THE SKIN AT BEDTIME  . lisinopril (PRINIVIL,ZESTRIL) 20 MG tablet TAKE 1 TABLET BY MOUTH TWICE A DAY  . metFORMIN (GLUCOPHAGE) 1000 MG tablet TAKE 1 TABLET(1000 MG) BY MOUTH TWICE DAILY WITH A MEAL  . Multiple Vitamin  (MULTIVITAMIN) capsule Take 1 capsule by mouth daily.    . nitroGLYCERIN (NITROSTAT) 0.4 MG SL tablet PLACE 1 TABLET UNDER TONGUE IF NEEDED FOR CHEST PAIN..MAX OF 3 DOSES  . pantoprazole (PROTONIX) 40 MG tablet Take 40 mg by mouth daily.  . vitamin C (ASCORBIC ACID) 500 MG tablet Take 500 mg by mouth daily.  . [DISCONTINUED] Insulin Glargine (LANTUS SOLOSTAR) 100 UNIT/ML Solostar Pen ADMINISTER 70  UNITS UNDER THE SKIN AT BEDTIME  . [DISCONTINUED] metFORMIN (GLUCOPHAGE) 1000 MG tablet TAKE 1 TABLET(1000 MG) BY MOUTH TWICE DAILY WITH A MEAL   No facility-administered encounter medications on file as of 05/05/2019.    ALLERGIES: Allergies  Allergen Reactions  . Crestor [Rosuvastatin] Other (See Comments)    myalgia   VACCINATION STATUS:  There is no immunization history on file for this patient.  Diabetes He presents for his follow-up diabetic visit. He has type 2 diabetes mellitus. Onset time: He was diagnosed at approximate age of 83 years. His disease course has been improving. There are no hypoglycemic associated symptoms. Pertinent negatives for hypoglycemia include no confusion, pallor or seizures. There are no diabetic associated symptoms. Pertinent negatives for diabetes include no fatigue, no polydipsia, no polyphagia, no polyuria and no weakness. There are no hypoglycemic complications. Symptoms are improving. Diabetic complications include heart disease and peripheral neuropathy. Risk factors for coronary artery disease include dyslipidemia, diabetes mellitus, hypertension and male sex. Current diabetic treatment includes intensive insulin program. His weight is fluctuating minimally. He is following a generally unhealthy diet. He has had a previous visit with a dietitian. He participates in exercise intermittently. His home blood glucose trend is decreasing steadily. His breakfast blood glucose range is generally 140-180 mg/dl. His lunch blood glucose range is generally 140-180 mg/dl. His  dinner blood glucose range is generally 140-180 mg/dl. His bedtime blood glucose range is generally 140-180 mg/dl. His overall blood glucose range is 140-180 mg/dl. An ACE inhibitor/angiotensin II receptor blocker is being taken. Eye exam is current.  Hyperlipidemia This is a chronic problem. The current episode started more than 1 year ago. The problem is controlled. Recent lipid tests were reviewed and are high. Exacerbating diseases include diabetes and obesity. Pertinent negatives include no myalgias. Current antihyperlipidemic treatment includes statins. Risk factors for coronary artery disease include diabetes mellitus, dyslipidemia, hypertension, male sex, a sedentary lifestyle, obesity and family history.   Review of systems: Limited as above.  Objective:    There were no vitals taken for this visit.  Wt Readings from Last 3 Encounters:  04/03/19 192 lb (87.1 kg)  12/31/18 192 lb (87.1 kg)  05/06/18 201 lb (91.2 kg)      Results for orders placed or performed in visit on 04/27/19  Comprehensive metabolic panel  Result Value Ref Range   Glucose 101 (H) 65 - 99 mg/dL   BUN 14 8 - 27 mg/dL   Creatinine, Ser 1.01 0.76 - 1.27 mg/dL   GFR calc non Af Amer 79 >59 mL/min/1.73   GFR calc Af Amer 92 >59 mL/min/1.73   BUN/Creatinine Ratio 14 10 - 24   Sodium 139 134 - 144 mmol/L   Potassium 4.5 3.5 - 5.2 mmol/L   Chloride 101 96 - 106 mmol/L   CO2 24 20 - 29 mmol/L   Calcium 9.2 8.6 - 10.2 mg/dL   Total Protein 6.3 6.0 - 8.5 g/dL   Albumin 4.2 3.8 - 4.8 g/dL   Globulin, Total 2.1 1.5 - 4.5 g/dL   Albumin/Globulin Ratio 2.0 1.2 - 2.2   Bilirubin Total 0.5 0.0 - 1.2 mg/dL   Alkaline Phosphatase 86 39 - 117 IU/L   AST 20 0 - 40 IU/L   ALT 23 0 - 44 IU/L  Hgb A1c w/o eAG  Result Value Ref Range   Hgb A1c MFr Bld 6.7 (H) 4.8 - 5.6 %   Diabetic Labs (most recent): Lab Results  Component Value Date  HGBA1C 6.7 (H) 04/27/2019   HGBA1C 7.3 (H) 12/24/2018   HGBA1C 7.5 (H)  08/29/2018   Lipid Panel     Component Value Date/Time   CHOL 159 12/24/2017 0952   TRIG 254 (H) 12/24/2017 0952   HDL 34 (L) 12/24/2017 0952   CHOLHDL 4.8 01/22/2017 1039   CHOLHDL 7.0 12/29/2012 0438   VLDL UNABLE TO CALCULATE IF TRIGLYCERIDE OVER 400 mg/dL 12/29/2012 0438   LDLCALC 74 12/24/2017 0952     Assessment & Plan:   1. Type 2 diabetes mellitus with vascular disease ([place])  His diabetes is  complicated by coronary artery disease status post CABG in 2006 and recent stent placement in 2014. He reports controlled glycemic profile both fasting and postprandial, and his previsit labs show A1c of 6.7%, improving.      Glucose logs and insulin administration records pertaining to this visit,  to be scanned into patient's records.    -Recent labs reviewed. - [name] remains at a high risk for more acute and chronic complications of diabetes which include CAD, CVA, CKD, retinopathy, and neuropathy. These are all discussed in detail with the patient.  - I have re-counseled the patient on diet management and weight loss  by adopting a carbohydrate restricted / protein rich  Diet.  - Patient is advised to stick to a routine mealtimes to eat 3 meals  a day and avoid unnecessary snacks ( to snack only to correct hypoglycemia).  - he  admits there is a room for improvement in his diet and drink choices. -  Suggestion is made for him to avoid simple carbohydrates  from his diet including Cakes, Sweet Desserts / Pastries, Ice Cream, Soda (diet and regular), Sweet Tea, Candies, Chips, Cookies, Sweet Pastries,  Store Bought Juices, Alcohol in Excess of  1-2 drinks a day, Artificial Sweeteners, Coffee Creamer, and "Sugar-free" Products. This will help patient to have stable blood glucose profile and potentially avoid unintended weight gain.   - I have approached patient with the following individualized plan to manage diabetes and patient agrees.  -  He'll continue to require  basal/bolus insulin to maintain diabetes control to target.    -He is advised to continue Lantus 70 units nightly , continue NovoLog  10 units 3 times a day before meals  for pre-meal BG readings of 90-172m/dl, plus patient specific correction dose of rapid acting insulin  for unexpected hyperglycemia above 1554mdl, associated with strict monitoring of glucose  AC and HS. - Patient is warned not to take insulin without proper monitoring per orders. -Adjustment parameters are given for hypo and hyperglycemia in writing. -Patient is encouraged to call clinic for blood glucose levels less than 70 or above 300 mg /dl.  -He is advised to continue metformin 1000 mg p.o. twice daily-daily after breakfast and after supper.   -He is benefiting from the CGM device.  He is advised where he is Freestyle libre CGM device at all times.    2) Lipids/HPL: Uncontrolled with low is improving hypertriglyceridemia to 254 from 1700.    He is advised to be consistent with his Lovaza 2 g p.o. twice daily, fenofibrate 145 mg p.o. daily, and Lipitor 20 mg p.o. daily at bedtime.    3) hypertension: he is advised to home monitor blood pressure and report if > 140/90 on 2 separate readings.  He is currently on lisinopril 20 mg p.o. daily, Cardizem 120 mg p.o. daily, carvedilol 12.5 mg p.o. twice daily.  I advised  patient to maintain close follow up with his PCP for primary care needs.  He will also advised to maintain close follow-up with his cardiologist.   - Patient Care Time Today:  25 min, of which >50% was spent in  counseling and the rest reviewing his  current and  previous labs/studies, previous treatments, his blood glucose readings, and medications' doses and developing a plan for long-term care based on the latest recommendations for standards of care.   Francis Hall participated in the discussions, expressed understanding, and voiced agreement with the above plans.  All questions were answered to his  satisfaction. he is encouraged to contact clinic should he have any questions or concerns prior to his return visit.   Follow up plan: Return in about 4 months (around 09/03/2019) for Bring Meter and Logs- A1c in Office, Include 8 log sheets.  Glade Lloyd, MD Phone: [phone]  Fax: [phone]  -  This note was partially dictated with voice recognition software. Similar sounding words can be transcribed inadequately or may not  be corrected upon review.  05/05/2019, 2:32 PM

## 2019-05-11 ENCOUNTER — Other Ambulatory Visit: Payer: Self-pay | Admitting: *Deleted

## 2019-05-11 ENCOUNTER — Encounter: Payer: Self-pay | Admitting: *Deleted

## 2019-05-11 NOTE — Patient Outreach (Signed)
[place] [facility]) Care Management [place] Telephone Outreach Routine referral for Disease Management  05/11/2019  Francis Hall [birth year] [id number]  Unsuccessful telephone outreach attempt to Francis Hall, 63 y/o male referred to [facility] RN CM for disease management follow up after recent retinal screening on 04/28/2019.  Patient has had no recent hospitalizations.  Patient has history including, but not limited to, DM- type II with insulin use; HTN/ HLD; CAD with previous NSTEMI/ CABG; GERD.  Explained to male person answering phone that I was calling to speak with male patient post- recent retinal screening; this person stated that patient is not available/ at home this afternoon, and she requested that I re- attempt follow up call to patient later this week during morning hours, after 10:00 am.  Plan:  Will place [facility] Community CM unsuccessful patient outreach letter in mail requesting call back in writing  Will re-attempt [place] telephone outreach within 4 business days if I do not hear back from patient first  Oneta Rack, RN, BSN, [employer] [facility] Care Management  [phone]

## 2019-05-15 ENCOUNTER — Encounter: Payer: Self-pay | Admitting: *Deleted

## 2019-05-15 ENCOUNTER — Other Ambulatory Visit: Payer: Self-pay | Admitting: *Deleted

## 2019-05-15 NOTE — Patient Outreach (Signed)
[place] [facility]) Care Management [place] Telephone Outreach  05/15/2019  QUAYSHAUN WIZNER Jul 01, 1955 [id number]  Unsuccessful telephone outreach attempt to [name], 63 y/o male referred to [facility] RN CM for disease management follow up after recent retinal screening on 04/28/2019.  Patient has had no recent hospitalizations.  Patient has history including, but not limited to, DM- type II with insulin use; HTN/ HLD; CAD with previous NSTEMI/ CABG; GERD.  Explained again to male person answering phone that I was calling to speak with male patient post- recent retinal screening; this person stated that patient is not available/ at home this morning, and she requested that I re- attempt follow up call to patient next week during morning hours, between 9:00- 10:00 am.  Plan:  Verified [place] unsuccessful patient outreach letter in mail requesting call back in writing on 05/11/2019  Will re-attempt [place] telephone outreach within 4 business days if I do not hear back from patient first  Oneta Rack, RN, BSN, [employer] Coordinator [facility] Care Management  [phone]

## 2019-05-19 ENCOUNTER — Other Ambulatory Visit: Payer: Self-pay | Admitting: *Deleted

## 2019-05-19 ENCOUNTER — Encounter: Payer: Self-pay | Admitting: *Deleted

## 2019-05-19 NOTE — Patient Outreach (Addendum)
[place] [facility]) [place] Telephone Outreach for Disease Management  05/19/2019  MARKEE LIENEMANN 05-May-1956 [id number]  Successful telephone outreach attempt to Francis Hall, 63 y/o male referred to [facility] RN CM for disease management follow up after recent retinal screening on 04/28/2019. Patient has had no recent hospitalizations.  Patient has history including, but not limited to, DM- type II with insulin use; HTN/ HLD; CAD with previous NSTEMI/ CABG; GERD.  HIPAA/ identity verified during phone call today; [facility] CM disease management program discussed with patient, who provides verbal consent to [facility] services today.  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  Patient denies pain and history of falls and sounds to be in no distress throughout phone call today.  Patient feels that he manages his diabetes well and is adherent to overall plan of care and attends all provider appointments.  Wife assists in managing medications and fills pill box for him; takes insulin independently and verbalizes accurate understanding of sliding scale insulin dosing and difference between long and short acting insulin.  Denies medication concerns.  Patient would like to eventually have sliding scale insulin discontinued and to only have to use long acting insulin.  Endocrinologist is Dr. Dorris Fetch, who patient has been active with for many years.  Accurately reports most recent A1-C of 6.7 (May 05, 2019).  Follows diabetic diet; wife is also diabetic.  Monitors and records blood sugars at home 3-4 times per day; has used [employer] in the past and also uses glucometer.  Denies concerns around medications, reports adherence with medications.  Patient does not have Advanced Directives in place for HCPOA or living Will and would like information on same; this part of Advanced Directive  planning was thoroughly discussed with patient.  SDOH completed for: Depression. Transportation, food insecurity and financial resource strain: no concerns identified and patient denies community resource needs  Patient denies issues, concerns, or problems today.  We discussed ongoing outreach and patient would like to be contacted quarterly for follow up.  Discussed with patient that I would place educational material around Advanced Directives and Diabetic meal planning/ dietary strategies for self-health management of blood sugar control.  I confirmed that patient has my direct phone number, the main [facility] CM office phone number, and the [facility] CM 24-hour nurse advice phone number should issues arise prior to next scheduled [place] outreach in 3 months.  Encouraged patient to contact me directly if needs, questions, issues, or concerns arise prior to next scheduled outreach; patient agreed to do so.  Plan:  Will place [facility] Community CM welcome letter in mail to patient  Will make patient's PCP aware of [facility] CM disease management involvement in patient's care  Patient will continue monitoring and recording blood sugars 3 times per day  Patient will continue attending provider appointments as scheduled and taking medications as prescribed  I will place educational materials around [place] and Diabetes diet in mail to patient  [facility] CM disease management outreach to continue with scheduled phone call in 3 months  [facility] CM Care Plan Problem One     Most Recent Value  Care Plan Problem One  Self- health management of chronic disease state of DM, as evidenced by patient reporting  Role Documenting the Problem One  Care Management [place] for Problem One  Active  [facility] Long Term  Goal   Over the next 90 days, patient will continue monitoring and recording blood sugars 3 times per day and will see a decrease in the amount of sliding scale insulin he uses  [facility] Long Term  Goal Start Date  05/19/19  Interventions for Problem One Long Term Goal  Initiated [facility] CM DM program,  discussed patient's current understanding of his diabetes, diet, and use of insulin,  discussed most recent A1-C,  initiated conversation around patient's dietary habits,  mailed patient printed educational material around dietary strategies for DM     Oneta Rack, RN, BSN, [employer] Coordinator [facility] Care Management  [phone]

## 2019-05-21 ENCOUNTER — Other Ambulatory Visit: Payer: Self-pay

## 2019-05-21 MED ORDER — LISINOPRIL 20 MG PO TABS: ORAL_TABLET | ORAL | 1 refills | Status: DC

## 2019-06-16 ENCOUNTER — Other Ambulatory Visit: Payer: Self-pay

## 2019-06-16 MED ORDER — FENOFIBRATE 48 MG PO TABS: 48.0000 mg | ORAL_TABLET | Freq: Every day | ORAL | 3 refills | Status: DC

## 2019-07-01 ENCOUNTER — Other Ambulatory Visit: Payer: Self-pay | Admitting: "Endocrinology

## 2019-07-17 ENCOUNTER — Other Ambulatory Visit: Payer: Self-pay

## 2019-07-17 MED ORDER — VITAMIN D3 25 MCG (1000 UT) PO CAPS: 2000.0000 [IU] | ORAL_CAPSULE | Freq: Every day | ORAL | 3 refills | Status: AC

## 2019-07-22 ENCOUNTER — Telehealth: Payer: Self-pay | Admitting: Cardiovascular Disease

## 2019-07-22 MED ORDER — CARVEDILOL 12.5 MG PO TABS: 12.5000 mg | ORAL_TABLET | Freq: Two times a day (BID) | ORAL | 2 refills | Status: DC

## 2019-07-22 NOTE — Telephone Encounter (Signed)
New message   Per patient's wife he needs a new prescription for carvedilol (COREG) 12.5 MG tablet sent to [facility], [place]. HARRISON S

## 2019-07-22 NOTE — Telephone Encounter (Signed)
Carvedilol has been sent into the pharmacy provided.

## 2019-07-30 IMAGING — NM NM MYOCAR MULTI W/SPECT W/WALL MOTION & EF
2 series · 12 of 12 positions shown · non-contrast
Comparison: none

[Series 1: rest · 6.51mm/px · 6 of 64 frames shown]
[frame 6/64]
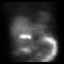
[frame 16/64]
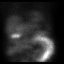
[frame 27/64]
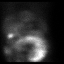
[frame 38/64]
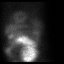
[frame 48/64]
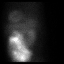
[frame 59/64]
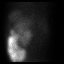

[Series 3: stress gated - perfusion · 6.51mm/px · 6 of 64 frames shown]
[frame 6/64]
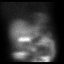
[frame 16/64]
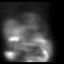
[frame 27/64]
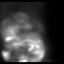
[frame 38/64]
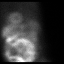
[frame 48/64]
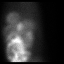
[frame 59/64]
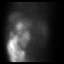

[12 of 12 positions shown; findings below may reference images not displayed]

Canned report from images found in remote index.

Refer to host system for actual result text.

## 2019-08-18 ENCOUNTER — Other Ambulatory Visit: Payer: Self-pay | Admitting: *Deleted

## 2019-08-18 ENCOUNTER — Encounter: Payer: Self-pay | Admitting: *Deleted

## 2019-08-18 NOTE — Patient Outreach (Signed)
[place] [facility]) Care Management [facility] Community CM Telephone Outreach, disease Management follow up call Unsuccessful (consecutive) outreach attempt # 1- previously engaged patient  08/18/2019  Francis Hall [birth year] [id number]  Unsuccessful telephone outreach attempt to [name], 64 y/o male referred to [facility] RN CM for disease management follow up after recent retinal screening on 04/28/2019. Patient has had no recent hospitalizations.  Patient has history including, but not limited to, DM- type II with insulin use; HTN/ HLD; CAD with previous NSTEMI/ CABG; GERD.  HIPAA compliant voice mail message left for patient, requesting return call back.  Plan:  Will re-attempt [place] telephone outreach within 4 business days if I do not hear back from patient first.  Oneta Rack, RN, BSN, [place] Coordinator [facility] Care Management  [phone]

## 2019-08-24 ENCOUNTER — Encounter: Payer: Self-pay | Admitting: *Deleted

## 2019-08-24 ENCOUNTER — Other Ambulatory Visit: Payer: Self-pay | Admitting: *Deleted

## 2019-08-24 NOTE — Patient Outreach (Signed)
[facility]) Care Management [place] Telephone Outreach  08/24/2019  [name] [birth year] [id number]  Successful telephone outreach attempt to Francis Hall, 64 y/o male referred to [facility] RN CM for disease management follow up after recent retinal screening on 04/28/2019. Patient has had no recent hospitalizations.  Patient has history including, but not limited to, DM- type II with insulin use; HTN/ HLD; CAD with previous NSTEMI/ CABG; GERD.  HIPAA/ identity verified during phone call today; Reminded patient of purpose of call and we again discussed [facility] CM disease management program; patient again provides verbal consent to [facility] services today.  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  Patient states "not much has changed" since last [facility] RN CN outreach, and he confirms that he has had no recent hospitalizations/ ER visits.  Patient further reports: -- continues taking all medications as prescribed- denies recent changes to medications; wife continues to assist in medication management by filling patient's pill box, and patient then takes independently: denies concerns around medications; remains able to accurately verbalize difference between short-and long-acting insulins and verbalizes understanding and adherence to insulin instructions ---- patient reports he does he has not been "real successful" in decreasing the amount of short acting insulin he uses, as previously stated as a goal; stated he has "stayed about the same as always;" and today he endorses generally using "between 10-11 units short acting" with meals, and he adjusts as indicated around his blood sugar readings and whatever is is getting to eat.  Continues 70 U long-acting HS insulin  -- Again accurately reports most recent  A1-C of 6.7 (May 05, 2019).  Continues to follow diabetic diet; endorses "mostly" adherent to diabetic and heart healthy diet.  Continues monitoring/ recording blood sugars at home 3-4 times per day; has used [employer] in the past but is no longer using; currently using glucometer.   ---- reports fasting blood sugar ranges "between 140-170," with lows occasionally down in 120- range; fasting this morning was "124" ---- post-prandial ranges "mostly between 150-200" ---- denies recent blood sugars at home significantly low/ high; reports one low reading "about a month ago" which he promptly corrected by eating something; stated that he didn't eat as much that day as normal, which caused him to go low; patient remains able to independently/ accurately verbalize signs/ symptoms hypoglycemia along with corresponding action plan.  ---- has upcoming provider appointment with endocrinologist "in April" with plans to attend; expects A1-C to be obtained and evaluated during visit; continues driving self to all provider appointments  Patient denies issues, concerns, or problems today. We discussed ongoing outreach and patient again stated that he would like to be contacted quarterly for follow up.  Discussed with patient that I would place referral for [place] and he is agreeable to this.  I confirmed that patient hasmy direct phone number, the main [place] office phone number, and the [facility] CM 24-hour nurse advice phone number should issues arise prior to [place] outreach.    Plan:  Will share today's [facility] CM note with patient's PCP as quarterly update  Patient will continue monitoring and recording blood sugars 3 times per day  Patient will continue attending provider appointments as scheduled and taking medications as prescribed  I will place  [facility] RN health Coach referral for ongoing disease management support/ outreach  [facility] CM Care Plan Problem One     Most Recent  Value  Care Plan Problem One  Self- health management of chronic disease state of DM, as evidenced by patient reporting  Role Documenting the Problem One  Care Management Coordinator  Care Plan for Problem One  Active  [facility] Long Term Goal   Over the next 90 days, patient will continue monitoring and recording blood sugars 3 times per day and will see a decrease in the amount of sliding scale insulin he uses  [facility] Long Term Goal Start Date  05/19/19  [facility] Long Term Goal Met Date  08/24/19 [Goal partially met]  Interventions for Problem One Long Term Goal  Discussed current clinical condition with patient and confirmed that he has had no recent clinical concerns and continues monitoring and recording blood sugars at home,  discussed use of insulin and confirmed that patient has contiued using long and short acting insulins as prescribed,  confirmed that patient has not seen significant decrease in amount of short acting insulin,  confirmed that he has scheduled upcoming appointment with endocrinologist for next A1-C level in April with plans to attend,  reviewed A1-C trends with patient     [facility] CM Care Plan Problem One     Most Recent Value  Care Plan Problem One  Self- health management of chronic disease state of DM, as evidenced by patient reporting  Role Documenting the Problem One  Care Management Coordinator  Care Plan for Problem One  Active  [facility] Long Term Goal   Over the next 90 days, patient will attend scheduled endocrinology provider appointment and will verbalize his most recent A1-C, as evidenced by patient reporting during [place] outreach  [place] Term Goal Start Date  08/24/19- goal modified/ expanded  [facility] Long Term Goal Met Date    Interventions for Problem One Long Term Goal  Reviewed with patient previous trends of A1-C,  confirmed that he has accurate understanding of significance of A1-C value,  reviewed patient's medications and confirmed that he has no current  medication concerns,  confirmed that patient understands difference between long and short-acting insulins,  placed [facility] RN health Coach referral for ongoing patient engagement     I have appreciated the opportunity to participate in Limmie's care,  Oneta Rack, RN, BSN, [employer] Coordinator [facility] Care Management  [phone]

## 2019-08-25 ENCOUNTER — Other Ambulatory Visit: Payer: Self-pay | Admitting: *Deleted

## 2019-09-03 ENCOUNTER — Ambulatory Visit: Payer: Medicare Other | Admitting: "Endocrinology

## 2019-09-09 ENCOUNTER — Encounter: Payer: Self-pay | Admitting: "Endocrinology

## 2019-09-09 ENCOUNTER — Other Ambulatory Visit: Payer: Self-pay

## 2019-09-09 ENCOUNTER — Ambulatory Visit (INDEPENDENT_AMBULATORY_CARE_PROVIDER_SITE_OTHER): Payer: Medicare Other | Admitting: "Endocrinology

## 2019-09-09 VITALS — BP 160/78 | HR 67 | Ht 67.0 in | Wt 193.8 lb

## 2019-09-09 DIAGNOSIS — E782 Mixed hyperlipidemia: Secondary | ICD-10-CM

## 2019-09-09 DIAGNOSIS — I10 Essential (primary) hypertension: Secondary | ICD-10-CM

## 2019-09-09 DIAGNOSIS — E1159 Type 2 diabetes mellitus with other circulatory complications: Secondary | ICD-10-CM

## 2019-09-09 LAB — POCT GLYCOSYLATED HEMOGLOBIN (HGB A1C): Hemoglobin A1C: 7.4 % — AB (ref 4.0–5.6)

## 2019-09-09 MED ORDER — FREESTYLE LIBRE 14 DAY SENSOR MISC: 1.0000 | 1 refills | Status: DC

## 2019-09-09 NOTE — Progress Notes (Signed)
09/09/2019   Endocrinology follow-up note   Subjective:    Patient ID: Francis Hall, male    DOB: Feb 19, 1956,    Past Medical History:  Diagnosis Date  . Atrial fibrillation ([place])   . CAD (coronary artery disease)    [place]  . CHF (congestive heart failure) ([place]) 11/08/2010   2D Echo EF=>55%  . Diabetes mellitus    insulin  . Diverticulosis   . Dyspnea   . Family hx of colon cancer   . GERD (gastroesophageal reflux disease)   . HTN (hypertension)   . Hyperlipemia   . Hyperplastic colon polyp 07/16/07   30cm   . Myocardial infarction ([place]) 12/2012  . OSA (obstructive sleep apnea) 12/21/2007   sleep study perform REM 50mns REM and NREM was 95.0%, AHI of 12.31hr and RDI of 12.4hr   Past Surgical History:  Procedure Laterality Date  . CARDIAC CATHETERIZATION  2006 2011   2 stents  . CATARACT EXTRACTION W/PHACO Left 12/03/2013   Procedure: CATARACT EXTRACTION PHACO AND INTRAOCULAR LENS PLACEMENT (IOC);  Surgeon: KTonny Branch MD;  Location: [facility] ORS;  Service: Ophthalmology;  Laterality: Left;  CDE:7.07  . CATARACT EXTRACTION W/PHACO Right 12/14/2013   Procedure: CATARACT EXTRACTION PHACO AND INTRAOCULAR LENS PLACEMENT (IOC);  Surgeon: KTonny Branch MD;  Location: [facility] ORS;  Service: Ophthalmology;  Laterality: Right;  CDE 7.84   . COLONOSCOPY  07/2007   Dr JArnoldo Morale hyperplastic polyp 30cm  . COLONOSCOPY N/A 08/12/2012   Procedure: COLONOSCOPY;  Surgeon: SDanie Binder MD;  Location: [facility] ENDO SUITE;  Service: Endoscopy;  Laterality: N/A;  8:30  . COLONOSCOPY N/A 11/11/2017   Dr. FOneida Alar multiple tubular adenomas, next surveillance in 2022.   .Marland KitchenCORONARY ARTERY BYPASS GRAFT  06/2004   x2 performed for 40% left main stenosis, 80% proximal LAD stenosis of the first diagonal artery, and 50% left circumfles artery priximal stenosis.LIMA to LAD and free radial artery to circumflex coronary artery  . LEFT HEART CATHETERIZATION WITH CORONARY/GRAFT ANGIOGRAM  12/29/2012   Procedure:  LEFT HEART CATHETERIZATION WITH CBeatrix Fetters  Surgeon: DLeonie Man MD;  [facility];  Service: Cardiovascular;;  . POLYPECTOMY  11/11/2017   Procedure: POLYPECTOMY;  Surgeon: FDanie Binder MD;  Location: [facility] ENDO SUITE;  Service: Endoscopy;;  ascending colon, transverse, splenic flexure, sigmoid  . TONSILLECTOMY  1962   Social History   Socioeconomic History  . Marital status: Married    Spouse name: Not on file  . Number of children: 2  . Years of education: 165 . Highest education level: Not on file  Occupational History  . Occupation: disabled mDealer  Tobacco Use  . Smoking status: Never Smoker  . Smokeless tobacco: Never Used  Substance and Sexual Activity  . Alcohol use: Yes    Comment: social-2 beers/wk  . Drug use: No  . Sexual activity: Yes    Birth control/protection: None  Other Topics Concern  . Not on file  Social History Narrative  . Not on file   Social Determinants of Health   Financial Resource Strain: Low Risk   . Difficulty of Paying Living Expenses: Not hard at all  Food Insecurity: No Food Insecurity  . Worried About RCharity fundraiserin the Last Year: Never true  . Ran Out of Food in the Last Year: Never true  Transportation Needs: No Transportation Needs  . Lack of Transportation (Medical): No  . Lack  of Transportation (Non-Medical): No  Physical Activity:   . Days of Exercise per Week:   . Minutes of Exercise per Session:   Stress:   . Feeling of Stress :   Social Connections:   . Frequency of Communication with Friends and Family:   . Frequency of Social Gatherings with Friends and Family:   . Attends Religious Services:   . Active Member of Clubs or Organizations:   . Attends Archivist Meetings:   [name] Marital Status:    Outpatient Encounter Medications as of 09/09/2019  Medication Sig  . acetaminophen (TYLENOL) 325 MG tablet Take 650 mg by mouth every 6 (six) hours as needed for moderate pain or  headache.  [name] aspirin 81 MG EC tablet Take 81 mg by mouth daily.    [name] atorvastatin (LIPITOR) 20 MG tablet TAKE 1 TABLET BY MOUTH EVERYDAY AT BEDTIME  . blood glucose meter kit and supplies KIT Dispense based on patient and insurance preference. Use up to four times daily as directed. (FOR ICD-10 E11.65)  . carvedilol (COREG) 12.5 MG tablet Take 1 tablet (12.5 mg total) by mouth 2 (two) times daily with a meal.  . Cholecalciferol (VITAMIN D3) 25 MCG (1000 UT) CAPS Take 2 capsules (2,000 Units total) by mouth daily.  . Continuous Blood Gluc Sensor (FREESTYLE LIBRE 14 DAY SENSOR) MISC Inject 1 each into the skin every 14 (fourteen) days. Use as directed.  . diltiazem (CARDIZEM CD) 120 MG 24 hr capsule Take 1 capsule (120 mg total) by mouth daily.  . fenofibrate (TRICOR) 48 MG tablet Take 1 tablet (48 mg total) by mouth daily.  [name] HUMALOG KWIKPEN 100 UNIT/ML KwikPen INJECT UNDER THE SKIN 10 TO 16 UNITS THREE TIMES DAILY BEFORE MEALS  . hydrochlorothiazide (MICROZIDE) 12.5 MG capsule TAKE 1 CAPSULE BY MOUTH AS  NEEDED.  [name] Insulin Glargine (LANTUS SOLOSTAR) 100 UNIT/ML Solostar Pen ADMINISTER 70 UNITS UNDER THE SKIN AT BEDTIME  . lisinopril (ZESTRIL) 20 MG tablet TAKE 1 TABLET BY MOUTH TWICE A DAY  . metFORMIN (GLUCOPHAGE) 1000 MG tablet TAKE 1 TABLET(1000 MG) BY MOUTH TWICE DAILY WITH A MEAL  . Multiple Vitamin (MULTIVITAMIN) capsule Take 1 capsule by mouth daily.    . nitroGLYCERIN (NITROSTAT) 0.4 MG SL tablet PLACE 1 TABLET UNDER TONGUE IF NEEDED FOR CHEST PAIN..MAX OF 3 DOSES  . pantoprazole (PROTONIX) 40 MG tablet Take 40 mg by mouth daily.  . vitamin C (ASCORBIC ACID) 500 MG tablet Take 500 mg by mouth daily.  . [DISCONTINUED] Continuous Blood Gluc Sensor (FREESTYLE LIBRE 14 DAY SENSOR) MISC Inject 1 each into the skin every 14 (fourteen) days. Use as directed.   No facility-administered encounter medications on file as of 09/09/2019.   ALLERGIES: Allergies  Allergen Reactions  . Crestor  [Rosuvastatin] Other (See Comments)    myalgia   VACCINATION STATUS:  There is no immunization history on file for this patient.  Diabetes He presents for his follow-up diabetic visit. He has type 2 diabetes mellitus. Onset time: He was diagnosed at approximate age of 35 years. His disease course has been worsening. There are no hypoglycemic associated symptoms. Pertinent negatives for hypoglycemia include no confusion, pallor or seizures. There are no diabetic associated symptoms. Pertinent negatives for diabetes include no fatigue, no polydipsia, no polyphagia, no polyuria and no weakness. There are no hypoglycemic complications. Symptoms are worsening. Diabetic complications include heart disease and peripheral neuropathy. Risk factors for coronary artery disease include dyslipidemia, diabetes mellitus, hypertension and male sex.  Current diabetic treatment includes intensive insulin program. His weight is fluctuating minimally. He is following a generally unhealthy diet. He has had a previous visit with a dietitian. He participates in exercise intermittently. His home blood glucose trend is increasing steadily. His breakfast blood glucose range is generally 140-180 mg/dl. His lunch blood glucose range is generally 140-180 mg/dl. His dinner blood glucose range is generally 140-180 mg/dl. His bedtime blood glucose range is generally 140-180 mg/dl. His overall blood glucose range is 140-180 mg/dl. An ACE inhibitor/angiotensin II receptor blocker is being taken. Eye exam is current.  Hyperlipidemia This is a chronic problem. The current episode started more than 1 year ago. The problem is controlled. Recent lipid tests were reviewed and are high. Exacerbating diseases include diabetes and obesity. Pertinent negatives include no myalgias. Current antihyperlipidemic treatment includes statins. Risk factors for coronary artery disease include diabetes mellitus, dyslipidemia, hypertension, male sex, a sedentary  lifestyle, obesity and family history.     Review of systems  Constitutional: + Minimally fluctuating body weight,  current  Body mass index is 30.35 kg/m. , no fatigue, no subjective hyperthermia, no subjective hypothermia Eyes: no blurry vision, no xerophthalmia ENT: no sore throat, no nodules palpated in throat, no dysphagia/odynophagia, no hoarseness Cardiovascular: no Chest Pain, no Shortness of Breath, no palpitations, no leg swelling Respiratory: no cough, no shortness of breath Gastrointestinal: no Nausea/Vomiting/Diarhhea Musculoskeletal: no muscle/joint aches Skin: no rashes, no hyperemia Neurological: no tremors, no numbness, no tingling, no dizziness Psychiatric: no depression, no anxiety   Objective:    BP (!) 160/78   Pulse 67   Ht _0  (1.702 m)   Wt 193 lb 12.8 oz (87.9 kg)   BMI 30.35 kg/m   Wt Readings from Last 3 Encounters:  09/09/19 193 lb 12.8 oz (87.9 kg)  04/03/19 192 lb (87.1 kg)  12/31/18 192 lb (87.1 kg)     Physical Exam- Limited  Constitutional:  Body mass index is 30.35 kg/m. , not in acute distress, normal state of mind Eyes:  EOMI, no exophthalmos Neck: Supple Thyroid: No gross goiter Respiratory: Adequate breathing efforts Musculoskeletal: no gross deformities, strength intact in all four extremities, no gross restriction of joint movements Skin:  no rashes, no hyperemia Neurological: no tremor with outstretched hands,    Results for orders placed or performed in visit on 09/09/19  HgB A1c  Result Value Ref Range   Hemoglobin A1C 7.4 (A) 4.0 - 5.6 %   HbA1c POC (<> result, manual entry)     HbA1c, POC (prediabetic range)     HbA1c, POC (controlled diabetic range)     Diabetic Labs (most recent): Lab Results  Component Value Date   HGBA1C 7.4 (A) 09/09/2019   HGBA1C 6.7 (H) 04/27/2019   HGBA1C 7.3 (H) 12/24/2018   Lipid Panel     Component Value Date/Time   CHOL 159 12/24/2017 0952   TRIG 254 (H) 12/24/2017 0952   HDL  34 (L) 12/24/2017 0952   CHOLHDL 4.8 01/22/2017 1039   CHOLHDL 7.0 12/29/2012 0438   VLDL UNABLE TO CALCULATE IF TRIGLYCERIDE OVER 400 mg/dL 12/29/2012 0438   LDLCALC 74 12/24/2017 0952     Assessment & Plan:   1. Type 2 diabetes mellitus with vascular disease ([place])  His diabetes is  complicated by coronary artery disease status post CABG in 2006 and recent stent placement in 2014. He returns with slightly higher glycemic profile than last visit and point-of-care A1c 7.4% increasing from 6.7%.  Glucose logs and insulin administration records pertaining to this visit,  to be scanned into patient's records.    -Recent labs reviewed. - Francis Hall remains at a high risk for more acute and chronic complications of diabetes which include CAD, CVA, CKD, retinopathy, and neuropathy. These are all discussed in detail with the patient.  - I have re-counseled the patient on diet management and weight loss  by adopting a carbohydrate restricted / protein rich  Diet.  - Patient is advised to stick to a routine mealtimes to eat 3 meals  a day and avoid unnecessary snacks ( to snack only to correct hypoglycemia).  - he  admits there is a room for improvement in his diet and drink choices. -  Suggestion is made for him to avoid simple carbohydrates  from his diet including Cakes, Sweet Desserts / Pastries, Ice Cream, Soda (diet and regular), Sweet Tea, Candies, Chips, Cookies, Sweet Pastries,  Store Bought Juices, Alcohol in Excess of  1-2 drinks a day, Artificial Sweeteners, Coffee Creamer, and "Sugar-free" Products. This will help patient to have stable blood glucose profile and potentially avoid unintended weight gain.   - I have approached patient with the following individualized plan to manage diabetes and patient agrees.  -  He'll continue to require basal/bolus insulin to maintain diabetes control to target.    -He is advised to continue Lantus 70 units nightly, continue Humalog  10  units 3 times a day before meals  for pre-meal BG readings of 90-'150mg'$ /dl, plus patient specific correction dose of rapid acting insulin  for unexpected hyperglycemia above '150mg'$ /dl, associated with strict monitoring of glucose  AC and HS. - Patient is warned not to take insulin without proper monitoring per orders. -Adjustment parameters are given for hypo and hyperglycemia in writing. -Patient is encouraged to call clinic for blood glucose levels less than 70 or above 300 mg /dl.  -He is advised to continue metformin 1000 mg p.o. twice daily-daily after breakfast and after supper.   -He will benefit from CGM device.  I discussed and prescribed more sensors for him.  He is monitoring blood glucose 4 times a day-before meals and at bedtime.     2) Lipids/HPL: Uncontrolled with low is improving hypertriglyceridemia to 254 from 1700.    He is advised to be consistent with his Lovaza 2 g p.o. twice daily, fenofibrate 145 mg p.o. daily, and Lipitor 20 mg p.o. daily at bedtime.    3) hypertension: His blood pressure is not controlled to target.  He is currently on lisinopril 20 mg p.o. daily, Cardizem 120 mg p.o. daily, carvedilol 12.5 mg p.o. twice daily.  4) weight management-his BMI 30.3.  He will benefit from moderate weight loss.  Exercise, carbs management discussed with him.  I advised patient to maintain close follow up with Dr. Sharilyn Sites for primary care needs.  He is  also advised to maintain close follow-up with his cardiologist.  - Time spent on this patient care encounter:  35 min, of which > 50% was spent in  counseling and the rest reviewing his blood glucose logs , discussing his hypoglycemia and hyperglycemia episodes, reviewing his current and  previous labs / studies  ( including abstraction from other facilities) and medications  doses and developing a  long term treatment plan and documenting his care.   Please refer to Patient Instructions for Blood Glucose Monitoring and  Insulin/Medications Dosing Guide"  in media tab for additional information. Please  also  refer to " Patient Self Inventory" in the Media  tab for reviewed elements of pertinent patient history.  Francis Hall participated in the discussions, expressed understanding, and voiced agreement with the above plans.  All questions were answered to his satisfaction. he is encouraged to contact clinic should he have any questions or concerns prior to his return visit.  Follow up plan: Return in about 4 months (around 01/09/2020) for Bring Meter and Logs- A1c in Office, Follow up with Pre-visit Labs.  Glade Lloyd, MD Phone: [phone]  Fax: [phone]  -  This note was partially dictated with voice recognition software. Similar sounding words can be transcribed inadequately or may not  be corrected upon review.  09/09/2019, 9:44 PM

## 2019-09-09 NOTE — Patient Instructions (Signed)

## 2019-09-10 ENCOUNTER — Other Ambulatory Visit: Payer: Self-pay

## 2019-09-10 MED ORDER — LANTUS SOLOSTAR 100 UNIT/ML ~~LOC~~ SOPN: PEN_INJECTOR | SUBCUTANEOUS | 0 refills | Status: DC

## 2019-09-28 ENCOUNTER — Telehealth: Payer: Self-pay | Admitting: "Endocrinology

## 2019-09-28 MED ORDER — FREESTYLE LIBRE 14 DAY SENSOR MISC: 1.0000 | 3 refills | Status: DC

## 2019-09-28 MED ORDER — BLOOD GLUCOSE METER KIT: 1.0000 | PACK | Freq: Four times a day (QID) | 0 refills | Status: AC

## 2019-09-28 NOTE — Telephone Encounter (Signed)
Pt's wife would like you to call her about his Elenor Legato. Call home first and if not then call cell, Manuela Schwartz.  [phone]

## 2019-09-28 NOTE — Telephone Encounter (Signed)
Rx refill for [place] sensors sent to [facility] order. New rx for glucose meter kit and supplies sent to [facility] in [place] per pt request.

## 2019-09-29 ENCOUNTER — Other Ambulatory Visit: Payer: Self-pay | Admitting: "Endocrinology

## 2019-10-01 ENCOUNTER — Other Ambulatory Visit: Payer: Self-pay | Admitting: "Endocrinology

## 2019-10-02 DIAGNOSIS — I2510 Atherosclerotic heart disease of native coronary artery without angina pectoris: Secondary | ICD-10-CM | POA: Diagnosis not present

## 2019-10-02 DIAGNOSIS — Z794 Long term (current) use of insulin: Secondary | ICD-10-CM | POA: Diagnosis not present

## 2019-10-02 DIAGNOSIS — E1140 Type 2 diabetes mellitus with diabetic neuropathy, unspecified: Secondary | ICD-10-CM | POA: Diagnosis not present

## 2019-10-02 DIAGNOSIS — I10 Essential (primary) hypertension: Secondary | ICD-10-CM | POA: Diagnosis not present

## 2019-10-20 ENCOUNTER — Other Ambulatory Visit: Payer: Self-pay

## 2019-10-26 ENCOUNTER — Other Ambulatory Visit: Payer: Self-pay

## 2019-10-26 MED ORDER — LANTUS SOLOSTAR 100 UNIT/ML ~~LOC~~ SOPN: PEN_INJECTOR | SUBCUTANEOUS | 0 refills | Status: DC

## 2019-11-02 DIAGNOSIS — I2510 Atherosclerotic heart disease of native coronary artery without angina pectoris: Secondary | ICD-10-CM | POA: Diagnosis not present

## 2019-11-02 DIAGNOSIS — Z794 Long term (current) use of insulin: Secondary | ICD-10-CM | POA: Diagnosis not present

## 2019-11-02 DIAGNOSIS — I10 Essential (primary) hypertension: Secondary | ICD-10-CM | POA: Diagnosis not present

## 2019-11-02 DIAGNOSIS — E1140 Type 2 diabetes mellitus with diabetic neuropathy, unspecified: Secondary | ICD-10-CM | POA: Diagnosis not present

## 2019-11-11 ENCOUNTER — Other Ambulatory Visit: Payer: Self-pay | Admitting: *Deleted

## 2019-11-11 NOTE — Patient Outreach (Signed)
[place] [facility]) Care Management  11/11/2019  Francis Hall 1955-07-06 [id number]   [place] Monthly Outreach  Referral Date:  08/24/2019 Referral Source:  Transfer from [place] Reason for Referral:  Continued Disease Management Education Insurance:  [employer]   Outreach Attempt:  Outreach attempt #1 to patient for introduction and follow up. No answer. RN Health Coach left HIPAA compliant voicemail message along with contact information.  Plan:  RN Health Coach will make another outreach attempt within the month of July if no return call back from patient.   [place] Coach [phone] [email or web address]

## 2019-11-13 ENCOUNTER — Other Ambulatory Visit: Payer: Self-pay | Admitting: Cardiology

## 2019-11-13 MED ORDER — HYDROCHLOROTHIAZIDE 12.5 MG PO CAPS: ORAL_CAPSULE | ORAL | 1 refills | Status: DC

## 2019-11-13 MED ORDER — LISINOPRIL 20 MG PO TABS: ORAL_TABLET | ORAL | 1 refills | Status: DC

## 2019-11-13 NOTE — Telephone Encounter (Signed)
New message    *STAT* If patient is at the pharmacy, call can be transferred to refill team.   1. Which medications need to be refilled? (please list name of each medication and dose if known) lisinopril (ZESTRIL) 20 MG tablet   hydrochlorothiazide (MICROZIDE) 12.5 MG capsule 2. Which pharmacy/location (including street and city if local pharmacy) is medication to be sent to?[id number] - [place], [place]. Do they need a 30 day or 90 day supply? [place]

## 2019-11-16 ENCOUNTER — Other Ambulatory Visit: Payer: Self-pay | Admitting: "Endocrinology

## 2019-11-16 ENCOUNTER — Other Ambulatory Visit: Payer: Self-pay

## 2019-11-30 DIAGNOSIS — Z0000 Encounter for general adult medical examination without abnormal findings: Secondary | ICD-10-CM | POA: Diagnosis not present

## 2019-11-30 DIAGNOSIS — Z23 Encounter for immunization: Secondary | ICD-10-CM | POA: Diagnosis not present

## 2019-11-30 DIAGNOSIS — E118 Type 2 diabetes mellitus with unspecified complications: Secondary | ICD-10-CM | POA: Diagnosis not present

## 2019-11-30 DIAGNOSIS — Z6830 Body mass index (BMI) 30.0-30.9, adult: Secondary | ICD-10-CM | POA: Diagnosis not present

## 2019-11-30 DIAGNOSIS — Z1389 Encounter for screening for other disorder: Secondary | ICD-10-CM | POA: Diagnosis not present

## 2019-11-30 DIAGNOSIS — I10 Essential (primary) hypertension: Secondary | ICD-10-CM | POA: Diagnosis not present

## 2019-11-30 DIAGNOSIS — E109 Type 1 diabetes mellitus without complications: Secondary | ICD-10-CM | POA: Diagnosis not present

## 2019-12-02 DIAGNOSIS — E1140 Type 2 diabetes mellitus with diabetic neuropathy, unspecified: Secondary | ICD-10-CM | POA: Diagnosis not present

## 2019-12-02 DIAGNOSIS — Z794 Long term (current) use of insulin: Secondary | ICD-10-CM | POA: Diagnosis not present

## 2019-12-02 DIAGNOSIS — I10 Essential (primary) hypertension: Secondary | ICD-10-CM | POA: Diagnosis not present

## 2019-12-02 DIAGNOSIS — I2510 Atherosclerotic heart disease of native coronary artery without angina pectoris: Secondary | ICD-10-CM | POA: Diagnosis not present

## 2019-12-07 ENCOUNTER — Other Ambulatory Visit: Payer: Self-pay | Admitting: "Endocrinology

## 2019-12-08 ENCOUNTER — Other Ambulatory Visit: Payer: Self-pay | Admitting: *Deleted

## 2019-12-08 NOTE — Patient Outreach (Signed)
[place] [facility]) Care Management  12/08/2019  GREYCEN FELTER 1955/09/22 [id number]   [place] Outreach  Referral Date:  08/24/2019 Referral Source:  Transfer from [place] Reason for Referral:  Continued Disease Management Education Insurance:  [employer] Medicare   Outreach Attempt:  Outreach attempt #2 to patient for introduction and follow up. No answer and unable to leave voicemail message due to voicemail not recording.  Plan:  RN Health Coach will make another outreach attempt within the month of August if no return call back from patient.   [place] [phone] [email or web address]

## 2019-12-24 ENCOUNTER — Other Ambulatory Visit: Payer: Self-pay

## 2019-12-24 MED ORDER — ATORVASTATIN CALCIUM 20 MG PO TABS: ORAL_TABLET | ORAL | 2 refills | Status: DC

## 2019-12-31 ENCOUNTER — Other Ambulatory Visit: Payer: Self-pay | Admitting: "Endocrinology

## 2020-01-06 ENCOUNTER — Other Ambulatory Visit: Payer: Self-pay | Admitting: "Endocrinology

## 2020-01-06 DIAGNOSIS — E1159 Type 2 diabetes mellitus with other circulatory complications: Secondary | ICD-10-CM | POA: Diagnosis not present

## 2020-01-07 LAB — COMPREHENSIVE METABOLIC PANEL
ALT: 22 IU/L (ref 0–44)
AST: 18 IU/L (ref 0–40)
Albumin/Globulin Ratio: 1.8 (ref 1.2–2.2)
Albumin: 4.2 g/dL (ref 3.8–4.8)
Alkaline Phosphatase: 81 IU/L (ref 48–121)
BUN/Creatinine Ratio: 17 (ref 10–24)
BUN: 18 mg/dL (ref 8–27)
Bilirubin Total: 0.3 mg/dL (ref 0.0–1.2)
CO2: 21 mmol/L (ref 20–29)
Calcium: 9.1 mg/dL (ref 8.6–10.2)
Chloride: 105 mmol/L (ref 96–106)
Creatinine, Ser: 1.05 mg/dL (ref 0.76–1.27)
GFR calc Af Amer: 88 mL/min/{1.73_m2} (ref >59)
GFR calc non Af Amer: 76 mL/min/{1.73_m2} (ref >59)
Globulin, Total: 2.3 g/dL (ref 1.5–4.5)
Glucose: 159 mg/dL — ABNORMAL HIGH (ref 65–99)
Potassium: 4.3 mmol/L (ref 3.5–5.2)
Sodium: 140 mmol/L (ref 134–144)
Total Protein: 6.5 g/dL (ref 6.0–8.5)

## 2020-01-07 LAB — LIPID PANEL W/O CHOL/HDL RATIO
Cholesterol, Total: 168 mg/dL (ref 100–199)
HDL: 32 mg/dL — ABNORMAL LOW (ref >39)
LDL Chol Calc (NIH): 89 mg/dL (ref 0–99)
Triglycerides: 278 mg/dL — ABNORMAL HIGH (ref 0–149)
VLDL Cholesterol Cal: 47 mg/dL — ABNORMAL HIGH (ref 5–40)

## 2020-01-07 LAB — TSH: TSH: 3.8 u[IU]/mL (ref 0.450–4.500)

## 2020-01-07 LAB — T4, FREE: Free T4: 1.25 ng/dL (ref 0.82–1.77)

## 2020-01-13 ENCOUNTER — Ambulatory Visit (INDEPENDENT_AMBULATORY_CARE_PROVIDER_SITE_OTHER): Payer: Medicare Other | Admitting: "Endocrinology

## 2020-01-13 ENCOUNTER — Other Ambulatory Visit: Payer: Self-pay | Admitting: "Endocrinology

## 2020-01-13 ENCOUNTER — Encounter: Payer: Self-pay | Admitting: "Endocrinology

## 2020-01-13 ENCOUNTER — Other Ambulatory Visit: Payer: Self-pay

## 2020-01-13 VITALS — BP 144/74 | HR 76 | Ht 67.0 in | Wt 193.2 lb

## 2020-01-13 DIAGNOSIS — E782 Mixed hyperlipidemia: Secondary | ICD-10-CM

## 2020-01-13 DIAGNOSIS — I10 Essential (primary) hypertension: Secondary | ICD-10-CM

## 2020-01-13 DIAGNOSIS — E1159 Type 2 diabetes mellitus with other circulatory complications: Secondary | ICD-10-CM

## 2020-01-13 LAB — POCT GLYCOSYLATED HEMOGLOBIN (HGB A1C): Hemoglobin A1C: 7.3 % — AB (ref 4.0–5.6)

## 2020-01-13 MED ORDER — INSULIN LISPRO (1 UNIT DIAL) 100 UNIT/ML (KWIKPEN): PEN_INJECTOR | SUBCUTANEOUS | 0 refills | Status: DC

## 2020-01-13 NOTE — Patient Instructions (Signed)

## 2020-01-13 NOTE — Progress Notes (Signed)
01/13/2020   Endocrinology follow-up note   Subjective:    Patient ID: Francis Hall, male    DOB: 1956/04/20,    Past Medical History:  Diagnosis Date  . Atrial fibrillation ([place])   . CAD (coronary artery disease)    [place]  . CHF (congestive heart failure) ([place]) 11/08/2010   2D Echo EF=>55%  . Diabetes mellitus    insulin  . Diverticulosis   . Dyspnea   . Family hx of colon cancer   . GERD (gastroesophageal reflux disease)   . HTN (hypertension)   . Hyperlipemia   . Hyperplastic colon polyp 07/16/07   30cm   . Myocardial infarction ([place]) 12/2012  . OSA (obstructive sleep apnea) 12/21/2007   sleep study perform REM 71mns REM and NREM was 95.0%, AHI of 12.31hr and RDI of 12.4hr   Past Surgical History:  Procedure Laterality Date  . CARDIAC CATHETERIZATION  2006 2011   2 stents  . CATARACT EXTRACTION W/PHACO Left 12/03/2013   Procedure: CATARACT EXTRACTION PHACO AND INTRAOCULAR LENS PLACEMENT (IOC);  Surgeon: KTonny Branch MD;  Location: [facility];  Service: Ophthalmology;  Laterality: Left;  CDE:7.07  . CATARACT EXTRACTION W/PHACO Right 12/14/2013   Procedure: CATARACT EXTRACTION PHACO AND INTRAOCULAR LENS PLACEMENT (IOC);  Surgeon: KTonny Branch MD;  Location: [facility];  Service: Ophthalmology;  Laterality: Right;  CDE 7.84   . COLONOSCOPY  07/2007   Dr JArnoldo Morale hyperplastic polyp 30cm  . COLONOSCOPY N/A 08/12/2012   Procedure: COLONOSCOPY;  Surgeon: SDanie Binder MD;  Location: [facility];  Service: Endoscopy;  Laterality: N/A;  8:30  . COLONOSCOPY N/A 11/11/2017   Dr. FOneida Alar multiple tubular adenomas, next surveillance in 2022.   .Marland KitchenCORONARY ARTERY BYPASS GRAFT  06/2004   x2 performed for 40% left main stenosis, 80% proximal LAD stenosis of the first diagonal artery, and 50% left circumfles artery priximal stenosis.LIMA to LAD and free radial artery to circumflex coronary artery  . LEFT HEART CATHETERIZATION WITH CORONARY/GRAFT ANGIOGRAM  12/29/2012   Procedure:  LEFT HEART CATHETERIZATION WITH CBeatrix Fetters  Surgeon: DLeonie Man MD;  [facility];  Service: Cardiovascular;;  . POLYPECTOMY  11/11/2017   Procedure: POLYPECTOMY;  Surgeon: FDanie Binder MD;  Location: [facility];  Service: Endoscopy;;  ascending colon, transverse, splenic flexure, sigmoid  . TONSILLECTOMY  1962   Social History   Socioeconomic History  . Marital status: Married    Spouse name: Not on file  . Number of children: 2  . Years of education: 183 . Highest education level: Not on file  Occupational History  . Occupation: disabled mDealer  Tobacco Use  . Smoking status: Never Smoker  . Smokeless tobacco: Never Used  Vaping Use  . Vaping Use: Never used  Substance and Sexual Activity  . Alcohol use: Yes    Comment: social-2 beers/wk  . Drug use: No  . Sexual activity: Yes    Birth control/protection: None  Other Topics Concern  . Not on file  Social History Narrative  . Not on file   Social Determinants of Health   Financial Resource Strain: Low Risk   . Difficulty of Paying Living Expenses: Not hard at all  Food Insecurity: No Food Insecurity  . Worried About RCharity fundraiserin the Last Year: Never true  . Ran Out of Food in the Last Year: Never true  Transportation Needs: No Transportation Needs  .  Lack of Transportation (Medical): No  . Lack of Transportation (Non-Medical): No  Physical Activity:   . Days of Exercise per Week:   . Minutes of Exercise per Session:   Stress:   . Feeling of Stress :   Social Connections:   . Frequency of Communication with Friends and Family:   . Frequency of Social Gatherings with Friends and Family:   . Attends Religious Services:   . Active Member of Clubs or Organizations:   . Attends Archivist Meetings:   [name] Marital Status:    Outpatient Encounter Medications as of 01/13/2020  Medication Sig  . acetaminophen (TYLENOL) 325 MG tablet Take 650 mg by mouth every 6  (six) hours as needed for moderate pain or headache.  [name] aspirin 81 MG EC tablet Take 81 mg by mouth daily.    [name] atorvastatin (LIPITOR) 20 MG tablet TAKE 1 TABLET BY MOUTH EVERYDAY AT BEDTIME  . blood glucose meter kit and supplies KIT Dispense based on patient and insurance preference. Use up to four times daily as directed. (FOR ICD-10 E11.65)  . blood glucose meter kit and supplies 1 each by Other route 4 (four) times daily. Dispense based on patient and insurance preference. Use up to four times daily as directed. (FOR ICD-10 E10.9, E11.9).  . carvedilol (COREG) 12.5 MG tablet Take 1 tablet (12.5 mg total) by mouth 2 (two) times daily with a meal.  . Cholecalciferol (VITAMIN D3) 25 MCG (1000 UT) CAPS Take 2 capsules (2,000 Units total) by mouth daily.  . Continuous Blood Gluc Sensor (FREESTYLE LIBRE 14 DAY SENSOR) MISC Inject 1 each into the skin every 14 (fourteen) days. Use as directed.  . diltiazem (CARDIZEM CD) 120 MG 24 hr capsule Take 1 capsule (120 mg total) by mouth daily.  . fenofibrate (TRICOR) 48 MG tablet Take 1 tablet (48 mg total) by mouth daily.  . hydrochlorothiazide (MICROZIDE) 12.5 MG capsule TAKE 1 CAPSULE BY MOUTH AS  NEEDED.  [name] insulin lispro (HUMALOG KWIKPEN) 100 UNIT/ML KwikPen INJECT UNDER THE SKIN 10 TO 16 UNITS THREE TIMES DAILY BEFORE MEALS  . LANTUS SOLOSTAR 100 UNIT/ML Solostar Pen ADMINISTER 70 UNITS UNDER THE SKIN AT BEDTIME  . lisinopril (ZESTRIL) 20 MG tablet TAKE 1 TABLET BY MOUTH TWICE A DAY  . metFORMIN (GLUCOPHAGE) 1000 MG tablet TAKE 1 TABLET(1000 MG) BY MOUTH TWICE DAILY WITH A MEAL  . Multiple Vitamin (MULTIVITAMIN) capsule Take 1 capsule by mouth daily.    . nitroGLYCERIN (NITROSTAT) 0.4 MG SL tablet PLACE 1 TABLET UNDER TONGUE IF NEEDED FOR CHEST PAIN..MAX OF 3 DOSES  . ONETOUCH VERIO test strip USE UP TO FOUR TIMES DAILY AS DIRECTED  . pantoprazole (PROTONIX) 40 MG tablet Take 40 mg by mouth daily.  . vitamin C (ASCORBIC ACID) 500 MG tablet Take 500 mg  by mouth daily.  . [DISCONTINUED] HUMALOG KWIKPEN 100 UNIT/ML KwikPen INJECT UNDER THE SKIN 10 TO 16 UNITS THREE TIMES DAILY BEFORE MEALS   No facility-administered encounter medications on file as of 01/13/2020.   ALLERGIES: Allergies  Allergen Reactions  . Crestor [Rosuvastatin] Other (See Comments)    myalgia   VACCINATION STATUS:  There is no immunization history on file for this patient.  Diabetes He presents for his follow-up diabetic visit. He has type 2 diabetes mellitus. Onset time: He was diagnosed at approximate age of 34 years. His disease course has been stable. There are no hypoglycemic associated symptoms. Pertinent negatives for hypoglycemia include no confusion, pallor or  seizures. There are no diabetic associated symptoms. Pertinent negatives for diabetes include no fatigue, no polydipsia, no polyphagia, no polyuria and no weakness. There are no hypoglycemic complications. Symptoms are stable. Diabetic complications include heart disease and peripheral neuropathy. Risk factors for coronary artery disease include dyslipidemia, diabetes mellitus, hypertension and male sex. Current diabetic treatment includes intensive insulin program. His weight is fluctuating minimally. He is following a generally unhealthy diet. He has had a previous visit with a dietitian. He participates in exercise intermittently. His home blood glucose trend is decreasing steadily. His breakfast blood glucose range is generally 140-180 mg/dl. His lunch blood glucose range is generally 140-180 mg/dl. His dinner blood glucose range is generally 140-180 mg/dl. His bedtime blood glucose range is generally 140-180 mg/dl. His overall blood glucose range is 140-180 mg/dl. (He presents with stable glycemic profile, point-of-care A1c 7.3%.  No documented or reported hypoglycemia.) An ACE inhibitor/angiotensin II receptor blocker is being taken. Eye exam is current.  Hyperlipidemia This is a chronic problem. The current  episode started more than 1 year ago. The problem is controlled. Recent lipid tests were reviewed and are high. Exacerbating diseases include diabetes and obesity. Pertinent negatives include no myalgias. Current antihyperlipidemic treatment includes statins. Risk factors for coronary artery disease include diabetes mellitus, dyslipidemia, hypertension, male sex, a sedentary lifestyle, obesity and family history.     Review of systems  Constitutional: + Minimally fluctuating body weight,  current  Body mass index is 30.26 kg/m. , no fatigue, no subjective hyperthermia, no subjective hypothermia Eyes: no blurry vision, no xerophthalmia ENT: no sore throat, no nodules palpated in throat, no dysphagia/odynophagia, no hoarseness Cardiovascular: no Chest Pain, no Shortness of Breath, no palpitations, no leg swelling Respiratory: no cough, no shortness of breath Gastrointestinal: no Nausea/Vomiting/Diarhhea Musculoskeletal: no muscle/joint aches Skin: no rashes, no hyperemia Neurological: no tremors, no numbness, no tingling, no dizziness Psychiatric: no depression, no anxiety   Objective:    BP (!) 144/74   Pulse 76   Ht '5\' 7"'  (1.702 m)   Wt 193 lb 3.2 oz (87.6 kg)   BMI 30.26 kg/m   Wt Readings from Last 3 Encounters:  01/13/20 193 lb 3.2 oz (87.6 kg)  09/09/19 193 lb 12.8 oz (87.9 kg)  04/03/19 192 lb (87.1 kg)      Physical Exam- Limited  Constitutional:  Body mass index is 30.26 kg/m. , not in acute distress, normal state of mind Eyes:  EOMI, no exophthalmos Neck: Supple Thyroid: No gross goiter Respiratory: Adequate breathing efforts Musculoskeletal: no gross deformities, strength intact in all four extremities, no gross restriction of joint movements Skin:  no rashes, no hyperemia Neurological: no tremor with outstretched hands    Results for orders placed or performed in visit on 01/13/20  HgB A1c  Result Value Ref Range   Hemoglobin A1C 7.3 (A) 4.0 - 5.6 %    HbA1c POC (<> result, manual entry)     HbA1c, POC (prediabetic range)     HbA1c, POC (controlled diabetic range)     Diabetic Labs (most recent): Lab Results  Component Value Date   HGBA1C 7.3 (A) 01/13/2020   HGBA1C 7.4 (A) 09/09/2019   HGBA1C 6.7 (H) 04/27/2019   Lipid Panel     Component Value Date/Time   CHOL 168 01/06/2020 0905   TRIG 278 (H) 01/06/2020 0905   HDL 32 (L) 01/06/2020 0905   CHOLHDL 4.8 01/22/2017 1039   CHOLHDL 7.0 12/29/2012 0438   VLDL UNABLE TO CALCULATE IF  TRIGLYCERIDE OVER 400 mg/dL 12/29/2012 0438   LDLCALC 89 01/06/2020 0905    CMP Latest Ref Rng & Units 01/06/2020 04/27/2019 12/24/2018  Glucose 65 - 99 mg/dL 159(H) 101(H) 196(H)  BUN 8 - 27 mg/dL '18 14 15  ' Creatinine 0.76 - 1.27 mg/dL 1.05 1.01 0.97  Sodium 134 - 144 mmol/L 140 139 140  Potassium 3.5 - 5.2 mmol/L 4.3 4.5 4.5  Chloride 96 - 106 mmol/L 105 101 104  CO2 20 - 29 mmol/L '21 24 20  ' Calcium 8.6 - 10.2 mg/dL 9.1 9.2 9.5  Total Protein 6.0 - 8.5 g/dL 6.5 6.3 6.3  Total Bilirubin 0.0 - 1.2 mg/dL 0.3 0.5 0.3  Alkaline Phos 48 - 121 IU/L 81 86 71  AST 0 - 40 IU/L '18 20 16  ' ALT 0 - 44 IU/L '22 23 23    ' Assessment & Plan:   1. Type 2 diabetes mellitus with vascular disease ([place])  His diabetes is  complicated by coronary artery disease status post CABG in 2006 and recent stent placement in 2014. He presents with stable glycemic profile, point-of-care A1c 7.3%.  No documented or reported hypoglycemia.  He uses his CGM device, 80% time range, 15% slightly above range.    Glucose logs and insulin administration records pertaining to this visit,  to be scanned into patient's records.    -Recent labs reviewed. - [name] remains at a high risk for more acute and chronic complications of diabetes which include CAD, CVA, CKD, retinopathy, and neuropathy. These are all discussed in detail with the patient.  - I have re-counseled the patient on diet management and weight loss  by adopting a  carbohydrate restricted / protein rich  Diet.  - Patient is advised to stick to a routine mealtimes to eat 3 meals  a day and avoid unnecessary snacks ( to snack only to correct hypoglycemia).  - he  admits there is a room for improvement in his diet and drink choices. -  Suggestion is made for him to avoid simple carbohydrates  from his diet including Cakes, Sweet Desserts / Pastries, Ice Cream, Soda (diet and regular), Sweet Tea, Candies, Chips, Cookies, Sweet Pastries,  Store Bought Juices, Alcohol in Excess of  1-2 drinks a day, Artificial Sweeteners, Coffee Creamer, and "Sugar-free" Products. This will help patient to have stable blood glucose profile and potentially avoid unintended weight gain.  - I have approached patient with the following individualized plan to manage diabetes and patient agrees.  -  He'll continue to require basal/bolus insulin to maintain diabetes control to target.    -He is advised to continue Lantus 70 units nightly, continue NovoLog 10 units 3 times a day before meals  for pre-meal BG readings of 90-159m/dl, plus patient specific correction dose of rapid acting insulin  for unexpected hyperglycemia above 1511mdl, associated with strict monitoring of glucose  AC and HS. - Patient is warned not to take insulin without proper monitoring per orders. -Adjustment parameters are given for hypo and hyperglycemia in writing. -Patient is encouraged to call clinic for blood glucose levels less than 70 or above 300 mg /dl.  -He is advised to continue Metformin 1000 mg p.o. twice daily--daily after breakfast and after supper.   -He will benefit from CGM device.  He is encouraged to use it at all times.   He is monitoring blood glucose 4 times a day-before meals and at bedtime.     2) Lipids/HPL: Lipid panel with improving, LDL at  89.  Triglycerides are 278 improving from 1700.    He is advised to be consistent with his Lovaza 2 g p.o. twice daily, fenofibrate 145 mg p.o.  daily, and Lipitor 20 mg p.o. daily at bedtime.    3) hypertension:  His blood pressure is better, not controlled to target.  He is currently on lisinopril 20 mg p.o. daily, Cardizem 120 mg p.o. daily, carvedilol 12.5 mg p.o. twice daily.  4) weight management-his BMI 30.26.  He will benefit from moderate weight loss.  Exercise, carbs management discussed with him.  I advised patient to maintain close follow up with Dr. Sharilyn Sites for primary care needs.  He is  also advised to maintain close follow-up with his cardiologist.  - Time spent on this patient care encounter:  35 min, of which > 50% was spent in  counseling and the rest reviewing his blood glucose logs , discussing his hypoglycemia and hyperglycemia episodes, reviewing his current and  previous labs / studies  ( including abstraction from other facilities) and medications  doses and developing a  long term treatment plan and documenting his care.   Please refer to Patient Instructions for Blood Glucose Monitoring and Insulin/Medications Dosing Guide"  in media tab for additional information. Please  also refer to " Patient Self Inventory" in the Media  tab for reviewed elements of pertinent patient history.  [name] participated in the discussions, expressed understanding, and voiced agreement with the above plans.  All questions were answered to his satisfaction. he is encouraged to contact clinic should he have any questions or concerns prior to his return visit.   Follow up plan: Return in about 3 months (around 04/14/2020) for Bring Meter and Logs- A1c in Office, [place] with [place].  Glade Lloyd, MD Phone: [phone]  Fax: [phone]  -  This note was partially dictated with voice recognition software. Similar sounding words can be transcribed inadequately or may not  be corrected upon review.  01/13/2020, 11:04 AM

## 2020-01-15 ENCOUNTER — Other Ambulatory Visit: Payer: Self-pay | Admitting: *Deleted

## 2020-01-15 NOTE — Patient Outreach (Signed)
[place] [facility]) Care Management  01/15/2020  Francis Hall 10-11-1955 [id number]   RN Health Coach Quarterly Outreach  Referral Date:08/24/2019 Referral Source:Transfer from [place] Reason for Referral:Continued Disease Management Education Insurance:[employer] [facility] Medicare   Outreach Attempt: Outreach attempt #3 to patient for follow up and introduction. No answer. RN Health Coach left HIPAA compliant voicemail message along with contact information.  Plan:  RN Health Coach will make another outreach attempt within the month of September if no return call back from patient.  RN Health Coach will send unsuccessful outreach letter.   [place] Coach [phone] [email or web address]

## 2020-01-20 ENCOUNTER — Other Ambulatory Visit: Payer: Self-pay

## 2020-01-20 DIAGNOSIS — E1159 Type 2 diabetes mellitus with other circulatory complications: Secondary | ICD-10-CM

## 2020-01-20 MED ORDER — FREESTYLE LIBRE 14 DAY SENSOR MISC: 1.0000 | 3 refills | Status: DC

## 2020-01-21 DIAGNOSIS — Z7984 Long term (current) use of oral hypoglycemic drugs: Secondary | ICD-10-CM | POA: Diagnosis not present

## 2020-01-21 DIAGNOSIS — H524 Presbyopia: Secondary | ICD-10-CM | POA: Diagnosis not present

## 2020-01-21 DIAGNOSIS — Z794 Long term (current) use of insulin: Secondary | ICD-10-CM | POA: Diagnosis not present

## 2020-01-21 DIAGNOSIS — Z961 Presence of intraocular lens: Secondary | ICD-10-CM | POA: Diagnosis not present

## 2020-01-21 DIAGNOSIS — E119 Type 2 diabetes mellitus without complications: Secondary | ICD-10-CM | POA: Diagnosis not present

## 2020-01-21 LAB — HM DIABETES EYE EXAM

## 2020-01-23 ENCOUNTER — Other Ambulatory Visit: Payer: Self-pay | Admitting: "Endocrinology

## 2020-01-25 ENCOUNTER — Other Ambulatory Visit: Payer: Self-pay | Admitting: "Endocrinology

## 2020-01-25 ENCOUNTER — Other Ambulatory Visit: Payer: Self-pay

## 2020-01-25 DIAGNOSIS — E1159 Type 2 diabetes mellitus with other circulatory complications: Secondary | ICD-10-CM

## 2020-01-25 MED ORDER — LANTUS SOLOSTAR 100 UNIT/ML ~~LOC~~ SOPN: 70.0000 [IU] | PEN_INJECTOR | Freq: Every day | SUBCUTANEOUS | 0 refills | Status: DC

## 2020-01-25 NOTE — Telephone Encounter (Signed)
Pt's Libre sensors was sent to [facility] on 01/20/20. Sent rx refill for lantus to [facility].

## 2020-01-25 NOTE — Telephone Encounter (Signed)
Patient is needing a refill on his sensors.  [facility]) [place], [place] Phone:  [phone]  Fax:  [phone]       Also needing a refill on Lantus  [id number] - [place], [place]. [place] [place] Phone:  [phone]  Fax:  [phone]

## 2020-01-26 ENCOUNTER — Other Ambulatory Visit: Payer: Self-pay | Admitting: "Endocrinology

## 2020-01-26 DIAGNOSIS — E1159 Type 2 diabetes mellitus with other circulatory complications: Secondary | ICD-10-CM

## 2020-02-14 ENCOUNTER — Other Ambulatory Visit: Payer: Self-pay | Admitting: Cardiology

## 2020-02-19 ENCOUNTER — Other Ambulatory Visit: Payer: Self-pay | Admitting: Cardiology

## 2020-02-19 ENCOUNTER — Other Ambulatory Visit: Payer: Self-pay | Admitting: *Deleted

## 2020-02-19 NOTE — Patient Outreach (Signed)
[place] [facility]) Care Management  02/19/2020  Francis Hall 21-Feb-1956 [id number]   RN Health CoachQuarterly Outreach  Referral Date:08/24/2019 Referral Source:Transfer from [place] Reason for Referral:Continued Disease Management Education Insurance:[employer] Cross [facility] Medicare   Outreach Attempt:  Outreach attempt #4 to patient for introduction and follow up. No answer. RN Health Coach left HIPAA compliant voicemail message along with contact information.  Plan:  RN Health Coach will make another outreach attempt within the month of October if no return call back from patient.   [place] [phone] [email or web address]

## 2020-03-03 DIAGNOSIS — E7849 Other hyperlipidemia: Secondary | ICD-10-CM | POA: Diagnosis not present

## 2020-03-03 DIAGNOSIS — E119 Type 2 diabetes mellitus without complications: Secondary | ICD-10-CM | POA: Diagnosis not present

## 2020-03-03 DIAGNOSIS — E6609 Other obesity due to excess calories: Secondary | ICD-10-CM | POA: Diagnosis not present

## 2020-03-03 DIAGNOSIS — I10 Essential (primary) hypertension: Secondary | ICD-10-CM | POA: Diagnosis not present

## 2020-03-18 ENCOUNTER — Other Ambulatory Visit: Payer: Self-pay | Admitting: Cardiovascular Disease

## 2020-03-25 ENCOUNTER — Other Ambulatory Visit: Payer: Self-pay | Admitting: *Deleted

## 2020-03-25 NOTE — Patient Outreach (Signed)
[place] [facility]) Care Management  Francis Hall  03/25/2020   Francis Hall December 28, 1955 [id number]   RN Health CoachQuarterly Outreach  Referral Date:08/24/2019 Referral Source:Transfer from [place] Reason for Referral:Continued Disease Management Education Insurance:[employer] [name] Medicare   Outreach Attempt:  Outreach attempt #5 to patient for introduction and follow up. No answer. RN Health Coach left voicemail message along with contact information.  Plan:  RN Health Coach will make another outreach attempt within the month of November if no return call back from patient.   [name] [phone] [email or web address]

## 2020-04-02 ENCOUNTER — Other Ambulatory Visit: Payer: Self-pay | Admitting: "Endocrinology

## 2020-04-02 DIAGNOSIS — E6609 Other obesity due to excess calories: Secondary | ICD-10-CM | POA: Diagnosis not present

## 2020-04-02 DIAGNOSIS — E7849 Other hyperlipidemia: Secondary | ICD-10-CM | POA: Diagnosis not present

## 2020-04-02 DIAGNOSIS — I10 Essential (primary) hypertension: Secondary | ICD-10-CM | POA: Diagnosis not present

## 2020-04-02 DIAGNOSIS — E119 Type 2 diabetes mellitus without complications: Secondary | ICD-10-CM | POA: Diagnosis not present

## 2020-04-07 ENCOUNTER — Other Ambulatory Visit: Payer: Self-pay | Admitting: "Endocrinology

## 2020-04-10 ENCOUNTER — Other Ambulatory Visit: Payer: Self-pay | Admitting: "Endocrinology

## 2020-04-11 ENCOUNTER — Other Ambulatory Visit: Payer: Self-pay | Admitting: Cardiovascular Disease

## 2020-04-14 ENCOUNTER — Ambulatory Visit: Payer: Medicare Other | Admitting: Nurse Practitioner

## 2020-04-14 ENCOUNTER — Encounter: Payer: Self-pay | Admitting: Nurse Practitioner

## 2020-04-14 ENCOUNTER — Other Ambulatory Visit: Payer: Self-pay

## 2020-04-14 VITALS — BP 161/82 | HR 56 | Ht 67.0 in | Wt 194.0 lb

## 2020-04-14 DIAGNOSIS — I10 Essential (primary) hypertension: Secondary | ICD-10-CM

## 2020-04-14 DIAGNOSIS — E1159 Type 2 diabetes mellitus with other circulatory complications: Secondary | ICD-10-CM

## 2020-04-14 DIAGNOSIS — E782 Mixed hyperlipidemia: Secondary | ICD-10-CM | POA: Diagnosis not present

## 2020-04-14 LAB — POCT GLYCOSYLATED HEMOGLOBIN (HGB A1C): Hemoglobin A1C: 7.2 % — AB (ref 4.0–5.6)

## 2020-04-14 MED ORDER — LANTUS SOLOSTAR 100 UNIT/ML ~~LOC~~ SOPN: 70.0000 [IU] | PEN_INJECTOR | Freq: Every day | SUBCUTANEOUS | 3 refills | Status: DC

## 2020-04-14 MED ORDER — INSULIN LISPRO (1 UNIT DIAL) 100 UNIT/ML (KWIKPEN): PEN_INJECTOR | SUBCUTANEOUS | 3 refills | Status: DC

## 2020-04-14 NOTE — Progress Notes (Signed)
04/14/2020   Endocrinology follow-up note   Subjective:    Patient ID: Francis Hall, male    DOB: [birth year],    Past Medical History:  Diagnosis Date  . Atrial fibrillation ([place])   . CAD (coronary artery disease)    [place]  . CHF (congestive heart failure) (Francis Hall) 11/08/2010   2D Echo EF=>55%  . Diabetes mellitus    insulin  . Diverticulosis   . Dyspnea   . Family hx of colon cancer   . GERD (gastroesophageal reflux disease)   . HTN (hypertension)   . Hyperlipemia   . Hyperplastic colon polyp 07/16/07   30cm   . Myocardial infarction (Francis Hall) 12/2012  . OSA (obstructive sleep apnea) 12/21/2007   sleep study perform REM 24mns REM and NREM was 95.0%, AHI of 12.31hr and RDI of 12.4hr   Past Surgical History:  Procedure Laterality Date  . CARDIAC CATHETERIZATION  2006 2011   2 stents  . CATARACT EXTRACTION W/PHACO Left 12/03/2013   Procedure: CATARACT EXTRACTION PHACO AND INTRAOCULAR LENS PLACEMENT (IOC);  Surgeon: KTonny Branch MD;  Location: [facility];  Service: Ophthalmology;  Laterality: Left;  CDE:7.07  . CATARACT EXTRACTION W/PHACO Right 12/14/2013   Procedure: CATARACT EXTRACTION PHACO AND INTRAOCULAR LENS PLACEMENT (IOC);  Surgeon: KTonny Branch MD;  Location: [facility];  Service: Ophthalmology;  Laterality: Right;  CDE 7.84   . COLONOSCOPY  07/2007   Dr JArnoldo Morale hyperplastic polyp 30cm  . COLONOSCOPY N/A 08/12/2012   Procedure: COLONOSCOPY;  Surgeon: SDanie Binder MD;  Location: [facility];  Service: Endoscopy;  Laterality: N/A;  8:30  . COLONOSCOPY N/A 11/11/2017   Dr. FOneida Alar multiple tubular adenomas, next surveillance in 2022.   .Marland KitchenCORONARY ARTERY BYPASS GRAFT  06/2004   x2 performed for 40% left main stenosis, 80% proximal LAD stenosis of the first diagonal artery, and 50% left circumfles artery priximal stenosis.LIMA to LAD and free radial artery to circumflex coronary artery  . LEFT HEART CATHETERIZATION WITH CORONARY/GRAFT ANGIOGRAM  12/29/2012    Procedure: LEFT HEART CATHETERIZATION WITH CBeatrix Hall  Surgeon: DLeonie Man MD;  [facility];  Service: Cardiovascular;;  . POLYPECTOMY  11/11/2017   Procedure: POLYPECTOMY;  Surgeon: FDanie Binder MD;  Location: [facility];  Service: Endoscopy;;  ascending colon, transverse, splenic flexure, sigmoid  . TONSILLECTOMY  1962   Social History   Socioeconomic History  . Marital status: Married    Spouse name: Not on file  . Number of children: 2  . Years of education: 114 . Highest education level: Not on file  Occupational History  . Occupation: disabled mDealer  Tobacco Use  . Smoking status: Never Smoker  . Smokeless tobacco: Never Used  Vaping Use  . Vaping Use: Never used  Substance and Sexual Activity  . Alcohol use: Yes    Comment: social-2 beers/wk  . Drug use: No  . Sexual activity: Yes    Birth control/protection: None  Other Topics Concern  . Not on file  Social History Narrative  . Not on file   Social Determinants of Health   Financial Resource Strain: Low Risk   . Difficulty of Paying Living Expenses: Not hard at all  Food Insecurity: No Food Insecurity  . Worried About RCharity fundraiserin the Last Year: Never true  . Ran Out of Food in the Last Year: Never true  Transportation Needs: No Transportation Needs  .  Lack of Transportation (Medical): No  . Lack of Transportation (Non-Medical): No  Physical Activity:   . Days of Exercise per Week: Not on file  . Minutes of Exercise per Session: Not on file  Stress:   . Feeling of Stress : Not on file  Social Connections:   . Frequency of Communication with Friends and Family: Not on file  . Frequency of Social Gatherings with Friends and Family: Not on file  . Attends Religious Services: Not on file  . Active Member of Clubs or Organizations: Not on file  . Attends Archivist Meetings: Not on file  . Marital Status: Not on file   Outpatient Encounter  Medications as of 04/14/2020  Medication Sig  . acetaminophen (TYLENOL) 325 MG tablet Take 650 mg by mouth every 6 (six) hours as needed for moderate pain or headache.  [name] aspirin 81 MG EC tablet Take 81 mg by mouth daily.    [name] atorvastatin (LIPITOR) 20 MG tablet TAKE 1 TABLET BY MOUTH EVERYDAY AT BEDTIME  . blood glucose meter kit and supplies KIT Dispense based on patient and insurance preference. Use up to four times daily as directed. (FOR ICD-10 E11.65)  . blood glucose meter kit and supplies 1 each by Other route 4 (four) times daily. Dispense based on patient and insurance preference. Use up to four times daily as directed. (FOR ICD-10 E10.9, E11.9).  . carvedilol (COREG) 12.5 MG tablet TAKE 1 TABLET(12.5 MG) BY MOUTH TWICE DAILY WITH A MEAL  . Cholecalciferol (VITAMIN D3) 25 MCG (1000 UT) CAPS Take 2 capsules (2,000 Units total) by mouth daily.  . Continuous Blood Gluc Sensor (FREESTYLE LIBRE 14 DAY SENSOR) MISC Inject 1 each into the skin every 14 (fourteen) days. Use as directed to check blood glucose four times daily  . diltiazem (CARDIZEM CD) 120 MG 24 hr capsule Take 1 capsule (120 mg total) by mouth daily.  . fenofibrate (TRICOR) 48 MG tablet Take 1 tablet (48 mg total) by mouth daily. Patient needs OV for future refills  . hydrochlorothiazide (MICROZIDE) 12.5 MG capsule TAKE 1 CAPSULE BY MOUTH DAILY AS NEEDED  . insulin glargine (LANTUS SOLOSTAR) 100 UNIT/ML Solostar Pen Inject 70 Units into the skin at bedtime.  . insulin lispro (HUMALOG KWIKPEN) 100 UNIT/ML KwikPen ADMINISTER 10 TO 16 UNITS UNDER THE SKIN THREE TIMES DAILY BEFORE MEALS  . lisinopril (ZESTRIL) 20 MG tablet TAKE 1 TABLET BY MOUTH TWICE DAILY  . metFORMIN (GLUCOPHAGE) 1000 MG tablet TAKE 1 TABLET(1000 MG) BY MOUTH TWICE DAILY WITH A MEAL  . Multiple Vitamin (MULTIVITAMIN) capsule Take 1 capsule by mouth daily.    . nitroGLYCERIN (NITROSTAT) 0.4 MG SL tablet PLACE 1 TABLET UNDER TONGUE IF NEEDED FOR CHEST PAIN..MAX OF 3  DOSES  . ONETOUCH VERIO test strip USE UP TO FOUR TIMES DAILY AS DIRECTED  . pantoprazole (PROTONIX) 40 MG tablet Take 40 mg by mouth daily.  . vitamin C (ASCORBIC ACID) 500 MG tablet Take 500 mg by mouth daily.  . [DISCONTINUED] insulin glargine (LANTUS SOLOSTAR) 100 UNIT/ML Solostar Pen Inject 70 Units into the skin at bedtime.  . [DISCONTINUED] insulin lispro (HUMALOG KWIKPEN) 100 UNIT/ML KwikPen ADMINISTER 10 TO 16 UNITS UNDER THE SKIN THREE TIMES DAILY BEFORE MEALS   No facility-administered encounter medications on file as of 04/14/2020.   ALLERGIES: Allergies  Allergen Reactions  . Crestor [Rosuvastatin] Other (See Comments)    myalgia   VACCINATION STATUS:  There is no immunization history on file for  this patient.  Diabetes He presents for his follow-up diabetic visit. He has type 2 diabetes mellitus. Onset time: He was diagnosed at approximate age of 28 years. His disease course has been improving. Hypoglycemia symptoms include sweats. Pertinent negatives for hypoglycemia include no confusion, pallor or seizures. There are no diabetic associated symptoms. Pertinent negatives for diabetes include no fatigue, no polydipsia, no polyphagia, no polyuria and no weakness. There are no hypoglycemic complications. Symptoms are improving. Diabetic complications include heart disease and peripheral neuropathy. Risk factors for coronary artery disease include dyslipidemia, diabetes mellitus, hypertension, male sex, sedentary lifestyle and obesity. Current diabetic treatment includes intensive insulin program and oral agent (monotherapy). He is compliant with treatment most of the time. His weight is fluctuating minimally. He is following a generally unhealthy diet. When asked about meal planning, he reported none. He has had a previous visit with a dietitian. He participates in exercise intermittently. His home blood glucose trend is fluctuating minimally. His breakfast blood glucose range is  generally >200 mg/dl. His lunch blood glucose range is generally 140-180 mg/dl. His dinner blood glucose range is generally 110-130 mg/dl. (He presents today with his logs, no meter, showing stable glycemic profile with above target fasting glucose and near target postprandial glycemic profile.  His POCT A1c today is 7.2%, essentially unchanged from previous visit of 7.3%.  He endorses eating snacks at night before bed while watching TV.  He does have some mild, rare episodes of hypoglycemia noted, usually due to meal timing.) An ACE inhibitor/angiotensin II receptor blocker is being taken. He does not see a podiatrist.Eye exam is current.  Hyperlipidemia This is a chronic problem. The current episode started more than 1 year ago. The problem is controlled. Recent lipid tests were reviewed and are variable. Exacerbating diseases include chronic renal disease, diabetes and obesity. Factors aggravating his hyperlipidemia include beta blockers and thiazides. Pertinent negatives include no myalgias. Current antihyperlipidemic treatment includes statins and fibric acid derivatives. The current treatment provides moderate improvement of lipids. There are no compliance problems.  Risk factors for coronary artery disease include diabetes mellitus, dyslipidemia, hypertension, male sex, a sedentary lifestyle, obesity and family history.     Review of systems  Constitutional: + Minimally fluctuating body weight,  current Body mass index is 30.38 kg/m. , no fatigue, no subjective hyperthermia, no subjective hypothermia Eyes: no blurry vision, no xerophthalmia ENT: no sore throat, no nodules palpated in throat, no dysphagia/odynophagia, no hoarseness Cardiovascular: no chest pain, no shortness of breath, no palpitations, no leg swelling Respiratory: no cough, no shortness of breath Gastrointestinal: no nausea/vomiting/diarrhea Musculoskeletal: no muscle/joint aches Skin: no rashes, no hyperemia Neurological: no  tremors, no numbness, no tingling, no dizziness Psychiatric: no depression, no anxiety   Objective:    BP (!) 161/82 (BP Location: Left Arm, Patient Position: Sitting)   Pulse (!) 56   Ht _0  (1.702 m)   Wt 194 lb (88 kg)   BMI 30.38 kg/m   Wt Readings from Last 3 Encounters:  04/14/20 194 lb (88 kg)  01/13/20 193 lb 3.2 oz (87.6 kg)  09/09/19 193 lb 12.8 oz (87.9 kg)    BP Readings from Last 3 Encounters:  04/14/20 (!) 161/82  01/13/20 (!) 144/74  09/09/19 (!) 160/78     Physical Exam- Limited  Constitutional:  Body mass index is 30.38 kg/m. , not in acute distress, normal state of mind Eyes:  EOMI, no exophthalmos Neck: Supple Thyroid: No gross goiter Cardiovascular: RRR, no murmers, rubs, or  gallops, no edema Respiratory: Adequate breathing efforts, no crackles, rales, rhonchi, or wheezing Musculoskeletal: no gross deformities, strength intact in all four extremities, no gross restriction of joint movements Skin:  no rashes, no hyperemia Neurological: no tremor with outstretched hands  Foot exam:   No rashes, ulcers, cuts, calluses, onychodystrophy.   Good pulses bilat.  Good sensation to 10 g monofilament bilat.    Results for orders placed or performed in visit on 04/14/20  HgB A1c  Result Value Ref Range   Hemoglobin A1C 7.2 (A) 4.0 - 5.6 %   HbA1c POC (<> result, manual entry)     HbA1c, POC (prediabetic range)     HbA1c, POC (controlled diabetic range)     Diabetic Labs (most recent): Lab Results  Component Value Date   HGBA1C 7.2 (A) 04/14/2020   HGBA1C 7.3 (A) 01/13/2020   HGBA1C 7.4 (A) 09/09/2019   Lipid Panel     Component Value Date/Time   CHOL 168 01/06/2020 0905   TRIG 278 (H) 01/06/2020 0905   HDL 32 (L) 01/06/2020 0905   CHOLHDL 4.8 01/22/2017 1039   CHOLHDL 7.0 12/29/2012 0438   VLDL UNABLE TO CALCULATE IF TRIGLYCERIDE OVER 400 mg/dL 12/29/2012 0438   LDLCALC 89 01/06/2020 0905    CMP Latest Ref Rng & Units 01/06/2020  04/27/2019 12/24/2018  Glucose 65 - 99 mg/dL 159(H) 101(H) 196(H)  BUN 8 - 27 mg/dL _0 Creatinine 0.76 - 1.27 mg/dL 1.05 1.01 0.97  Sodium 134 - 144 mmol/L 140 139 140  Potassium 3.5 - 5.2 mmol/L 4.3 4.5 4.5  Chloride 96 - 106 mmol/L 105 101 104  CO2 20 - 29 mmol/L _1 Calcium 8.6 - 10.2 mg/dL 9.1 9.2 9.5  Total Protein 6.0 - 8.5 g/dL 6.5 6.3 6.3  Total Bilirubin 0.0 - 1.2 mg/dL 0.3 0.5 0.3  Alkaline Phos 48 - 121 IU/L 81 86 71  AST 0 - 40 IU/L _2 ALT 0 - 44 IU/L _3 Assessment & Plan:   1. Type 2 diabetes mellitus with vascular disease ([place])  His diabetes is  complicated by coronary artery disease status post CABG in 2006 and recent stent placement in 2014.  He presents today with his logs, no meter, showing stable glycemic profile with above target fasting glucose and near target postprandial glycemic profile.  His POCT A1c today is 7.2%, essentially unchanged from previous visit of 7.3%.  He endorses eating snacks at night before bed while watching TV.  He does have some mild, rare episodes of hypoglycemia noted, usually due to meal timing.  -Recent labs reviewed.  - [name] remains at a high risk for more acute and chronic complications of diabetes which include CAD, CVA, CKD, retinopathy, and neuropathy. These are all discussed in detail with the patient.  - Nutritional counseling repeated at each appointment due to patients tendency to fall back in to old habits.  - The patient admits there is a room for improvement in their diet and drink choices. -  Suggestion is made for the patient to avoid simple carbohydrates from their diet including Cakes, Sweet Desserts / Pastries, Ice Cream, Soda (diet and regular), Sweet Tea, Candies, Chips, Cookies, Sweet Pastries,  Store Bought Juices, Alcohol in Excess of  1-2 drinks a day, Artificial Sweeteners, Coffee Creamer, and "Sugar-free" Products. This will help patient to have stable blood glucose profile and  potentially avoid unintended weight gain.   -  I encouraged the patient to switch to  unprocessed or minimally processed complex starch and increased protein intake (animal or plant source), fruits, and vegetables.   - Patient is advised to stick to a routine mealtimes to eat 3 meals  a day and avoid unnecessary snacks ( to snack only to correct hypoglycemia).  - I have approached patient with the following individualized plan to manage diabetes and patient agrees.  -  He'll continue to require basal/bolus insulin to maintain diabetes control to target.    -Based on his stable glycemic profile, no changes will be made to his regimen today.  He is advised to continue Novolog 10-16 units TID with meals if glucos is above 90 and he is eating.  Specific instructions on how to titrate insulin dose based on glucose readings given to patient in writing.  He is advised to continue Lantus 70 units SQ nightly and Metformin 1000 mg po twice daily with meals.  -He is encouraged to continue using his CGM and meter to monitor glucose 4 times daily, before meals and before bed, and call the clinic if he has readings less than 70 or greater than 300 for 3 tests in a row.  2) Lipids/HPL:  His most recent lipid panel from 01/06/20 shows controlled LDL at 89 and elevated triglycerides at 278 (overall improving).  He is advised to continue Lipitor 20 mg po daily at bedtime and Fenofibrate 48 mg po daily.  Side effects and precautions discussed with him.  3) Hypertension:  His blood pressure is not controlled to target.  He is advised to continue Coreg 12.5 mg po twice daily, Diltiazem 120 mg po daily, HCTZ 12.5 mg po daily, and Lisinopril 20 mg po daily.  May consider increasing on subsequent visits if BP remains elevated.  4) Weight management- His Body mass index is 30.38 kg/m.  He will benefit from moderate weight loss.  Exercise, carbs management discussed with him.  I advised patient to maintain close follow  up with Dr. Sharilyn Sites for primary care needs.  He is  also advised to maintain close follow-up with his cardiologist.  - Time spent on this patient care encounter:  35 min, of which > 50% was spent in  counseling and the rest reviewing his blood glucose logs , discussing his hypoglycemia and hyperglycemia episodes, reviewing his current and  previous labs / studies  ( including abstraction from other facilities) and medications  doses and developing a  long term treatment plan and documenting his care.   Please refer to Patient Instructions for Blood Glucose Monitoring and Insulin/Medications Dosing Guide"  in media tab for additional information. Please  also refer to " Patient Self Inventory" in the Media  tab for reviewed elements of pertinent patient history.  [name] participated in the discussions, expressed understanding, and voiced agreement with the above plans.  All questions were answered to his satisfaction. he is encouraged to contact clinic should he have any questions or concerns prior to his return visit.   Follow up plan: Return in about 4 months (around 08/12/2020) for Diabetes follow up- A1c and urine micro in office, Previsit labs, Bring glucometer and logs.  Rayetta Pigg, [facility] [address] [place], [place] [address] Phone: [phone] Fax: [phone]  04/14/2020, 11:02 AM

## 2020-04-14 NOTE — Patient Instructions (Signed)

## 2020-04-18 ENCOUNTER — Other Ambulatory Visit: Payer: Self-pay | Admitting: "Endocrinology

## 2020-04-26 ENCOUNTER — Other Ambulatory Visit: Payer: Self-pay | Admitting: *Deleted

## 2020-04-26 NOTE — Patient Outreach (Signed)
[facility]) Care Management  [name]  04/26/2020   Francis Hall 1956-03-23 [id number]   RN Health CoachQuarterly Outreach  Referral Date:08/24/2019 Referral Source:Transfer from [place] Reason for Referral:Continued Disease Management Education Insurance:[employer] [facility] Medicare   Outreach Attempt:  Outreach attempt #6 to patient for follow up. No answer. RN Health Coach left voicemail message along with contact information.  Plan:  RN Health Coach will make another outreach attempt within the month of December if no return call back from patient.   [name] [phone] [name]

## 2020-05-03 DIAGNOSIS — I10 Essential (primary) hypertension: Secondary | ICD-10-CM | POA: Diagnosis not present

## 2020-05-03 DIAGNOSIS — E782 Mixed hyperlipidemia: Secondary | ICD-10-CM | POA: Diagnosis not present

## 2020-05-03 DIAGNOSIS — E6609 Other obesity due to excess calories: Secondary | ICD-10-CM | POA: Diagnosis not present

## 2020-05-03 DIAGNOSIS — E119 Type 2 diabetes mellitus without complications: Secondary | ICD-10-CM | POA: Diagnosis not present

## 2020-05-17 ENCOUNTER — Other Ambulatory Visit: Payer: Self-pay | Admitting: *Deleted

## 2020-05-17 NOTE — Patient Outreach (Signed)
[facility]) Care Management  [place]  05/17/2020   Francis Hall 1955-12-27 [id number]   RN Health CoachQuarterly Outreach  Referral Date:08/24/2019 Referral Source:Transfer from [place] Reason for Referral:Continued Disease Management Education Insurance:[employer] [facility] Shield Medicare   Outreach Attempt:  Successful telephone outreach to patient.  HIPAA verified with patient.  RN Health Coach introduced self and role.  Patient declines continued participation in Disease Management program, stating he feels his chronic conditions are very well managed.  States he has [facility] contact information to contact in the future if needs arise.  Encouraged patient to contact [facility] if he has any questions, concerns or needs arise in the future.  Plan:  RN Health Coach will send primary care provider Case Closure Letter.  RN Health Coach will close cased based on patient declining services at this time.  RN Health Coach will send patient Case Closure Letter.   [place] Coach [phone] [name]

## 2020-05-19 ENCOUNTER — Ambulatory Visit: Payer: Self-pay | Admitting: *Deleted

## 2020-05-24 ENCOUNTER — Ambulatory Visit: Payer: Self-pay | Admitting: *Deleted

## 2020-05-26 ENCOUNTER — Other Ambulatory Visit: Payer: Self-pay | Admitting: Cardiology

## 2020-06-03 DIAGNOSIS — E119 Type 2 diabetes mellitus without complications: Secondary | ICD-10-CM | POA: Diagnosis not present

## 2020-06-03 DIAGNOSIS — I10 Essential (primary) hypertension: Secondary | ICD-10-CM | POA: Diagnosis not present

## 2020-06-03 DIAGNOSIS — E782 Mixed hyperlipidemia: Secondary | ICD-10-CM | POA: Diagnosis not present

## 2020-06-03 DIAGNOSIS — E6609 Other obesity due to excess calories: Secondary | ICD-10-CM | POA: Diagnosis not present

## 2020-06-22 ENCOUNTER — Other Ambulatory Visit: Payer: Self-pay | Admitting: Cardiovascular Disease

## 2020-06-27 DIAGNOSIS — U071 COVID-19: Secondary | ICD-10-CM | POA: Diagnosis not present

## 2020-07-02 DIAGNOSIS — E119 Type 2 diabetes mellitus without complications: Secondary | ICD-10-CM | POA: Diagnosis not present

## 2020-07-02 DIAGNOSIS — I10 Essential (primary) hypertension: Secondary | ICD-10-CM | POA: Diagnosis not present

## 2020-07-02 DIAGNOSIS — E6609 Other obesity due to excess calories: Secondary | ICD-10-CM | POA: Diagnosis not present

## 2020-07-02 DIAGNOSIS — E782 Mixed hyperlipidemia: Secondary | ICD-10-CM | POA: Diagnosis not present

## 2020-07-09 ENCOUNTER — Other Ambulatory Visit: Payer: Self-pay | Admitting: "Endocrinology

## 2020-07-12 ENCOUNTER — Telehealth: Payer: Self-pay

## 2020-07-12 ENCOUNTER — Other Ambulatory Visit: Payer: Self-pay

## 2020-07-12 ENCOUNTER — Ambulatory Visit: Payer: Medicare Other | Admitting: Cardiovascular Disease

## 2020-07-12 ENCOUNTER — Encounter: Payer: Self-pay | Admitting: Cardiovascular Disease

## 2020-07-12 VITALS — BP 160/64 | HR 78 | Ht 67.0 in | Wt 192.4 lb

## 2020-07-12 DIAGNOSIS — E782 Mixed hyperlipidemia: Secondary | ICD-10-CM | POA: Diagnosis not present

## 2020-07-12 DIAGNOSIS — E669 Obesity, unspecified: Secondary | ICD-10-CM

## 2020-07-12 DIAGNOSIS — E118 Type 2 diabetes mellitus with unspecified complications: Secondary | ICD-10-CM

## 2020-07-12 DIAGNOSIS — I5032 Chronic diastolic (congestive) heart failure: Secondary | ICD-10-CM

## 2020-07-12 DIAGNOSIS — I10 Essential (primary) hypertension: Secondary | ICD-10-CM

## 2020-07-12 DIAGNOSIS — I25810 Atherosclerosis of coronary artery bypass graft(s) without angina pectoris: Secondary | ICD-10-CM | POA: Diagnosis not present

## 2020-07-12 DIAGNOSIS — Z794 Long term (current) use of insulin: Secondary | ICD-10-CM

## 2020-07-12 DIAGNOSIS — E1159 Type 2 diabetes mellitus with other circulatory complications: Secondary | ICD-10-CM

## 2020-07-12 MED ORDER — METFORMIN HCL 1000 MG PO TABS: ORAL_TABLET | ORAL | 1 refills | Status: DC

## 2020-07-12 MED ORDER — HYDROCHLOROTHIAZIDE 25 MG PO TABS: 25.0000 mg | ORAL_TABLET | Freq: Every day | ORAL | 1 refills | Status: DC

## 2020-07-12 NOTE — Telephone Encounter (Signed)
Andree Coss, LPN talked with pt and has resent Rx.

## 2020-07-12 NOTE — Telephone Encounter (Signed)
Resent script, patient aware

## 2020-07-12 NOTE — Progress Notes (Signed)
Cardiology Office Note    Date:  07/17/2020   ID:  Francis Hall, Francis Hall [birth year], MRN [id number]  PCP:  Sharilyn Sites, MD  Cardiologist:   Sanda Klein, MD   Chief complaint: CAD  History of Present Illness:  Francis Hall is a 65 y.o. male with coronary artery disease and previous bypass surgery, diastolic heart failure, insulin-requiring diabetes mellitus, hypertension, mixed hyperlipidemia with severe hypertriglyceridemia.  Overall Francis Hall is doing well and denies any cardiovascular complaints. The patient specifically denies any chest pain at rest exertion, dyspnea at rest or with exertion, orthopnea, paroxysmal nocturnal dyspnea, syncope, palpitations, focal neurological deficits, intermittent claudication, lower extremity edema, unexplained weight gain, cough, hemoptysis or wheezing.  He had COVID-19 infection twice in the last 12 months, thankfully both times with relatively minor symptoms.  He did not require oxygen or hospitalization  He has noticed that his blood pressure is  on the high side.  He has been less physically active this winter, although he has not really gained any weight in the last 6 months (borderline obese).  Hemoglobin A1c remains borderline acceptable at 7.2%.  Triglycerides have decreased from 400-278, HDL remains low at 32, although this is an improvement.  LDL is not quite at desirable range at 89.  Francis Hall had multivessel bypass surgery in 2006 (LIMA to LAD, free radial to circumflex) and in 2014 presented with a small non-STEMI and required percutaneous revascularization (proximal posterolateral artery 2.25 x 16 mm Promus and mid right coronary artery 2.5 x 38 mm Promus and balloon angioplasty of the ostium of the PDA). He is a "Plavix nonresponder", on ticagrelor chronically.  Past Medical History:  Diagnosis Date  . Atrial fibrillation ([place])   . CAD (coronary artery disease)    [facility]  . CHF (congestive heart failure) ([place]) 11/08/2010   2D Echo  EF=>55%  . Diabetes mellitus    insulin  . Diverticulosis   . Dyspnea   . Family hx of colon cancer   . GERD (gastroesophageal reflux disease)   . HTN (hypertension)   . Hyperlipemia   . Hyperplastic colon polyp 07/16/07   30cm   . Myocardial infarction ([place]) 12/2012  . OSA (obstructive sleep apnea) 12/21/2007   sleep study perform REM 65mns REM and NREM was 95.0%, AHI of 12.31hr and RDI of 12.4hr    Past Surgical History:  Procedure Laterality Date  . CARDIAC CATHETERIZATION  2006 2011   2 stents  . CATARACT EXTRACTION W/PHACO Left 12/03/2013   Procedure: CATARACT EXTRACTION PHACO AND INTRAOCULAR LENS PLACEMENT (IOC);  Surgeon: KTonny Branch MD;  Location: [facility];  Service: Ophthalmology;  Laterality: Left;  CDE:7.07  . CATARACT EXTRACTION W/PHACO Right 12/14/2013   Procedure: CATARACT EXTRACTION PHACO AND INTRAOCULAR LENS PLACEMENT (IOC);  Surgeon: KTonny Branch MD;  Location: [facility];  Service: Ophthalmology;  Laterality: Right;  CDE 7.84   . COLONOSCOPY  07/2007   Dr JArnoldo Morale hyperplastic polyp 30cm  . COLONOSCOPY N/A 08/12/2012   Procedure: COLONOSCOPY;  Surgeon: SDanie Binder MD;  Location: [facility];  Service: Endoscopy;  Laterality: N/A;  8:30  . COLONOSCOPY N/A 11/11/2017   Dr. FOneida Alar multiple tubular adenomas, next surveillance in 2022.   .Marland KitchenCORONARY ARTERY BYPASS GRAFT  06/2004   x2 performed for 40% left main stenosis, 80% proximal LAD stenosis of the first diagonal artery, and 50% left circumfles artery priximal stenosis.LIMA to LAD and free radial artery to circumflex coronary artery  . LEFT HEART CATHETERIZATION WITH CORONARY/GRAFT  ANGIOGRAM  12/29/2012   Procedure: LEFT HEART CATHETERIZATION WITH Beatrix Fetters;  Surgeon: Leonie Man, MD;  Location[facility] C[facility] LAB;  Service: Cardiovascular;;  . POLYPECTOMY  11/11/2017   Procedure: POLYPECTOMY;  Surgeon: Danie Binder, MD;  Location: [facility];  Service: Endoscopy;;  ascending colon, transverse, splenic  flexure, sigmoid  . TONSILLECTOMY  1962    Current Medications: Outpatient Medications Prior to Visit  Medication Sig Dispense Refill  . acetaminophen (TYLENOL) 325 MG tablet Take 650 mg by mouth every 6 (six) hours as needed for moderate pain or headache.    [name] aspirin 81 MG EC tablet Take 81 mg by mouth daily.    [name] atorvastatin (LIPITOR) 20 MG tablet TAKE 1 TABLET BY MOUTH EVERYDAY AT BEDTIME 90 tablet 2  . blood glucose meter kit and supplies KIT Dispense based on patient and insurance preference. Use up to four times daily as directed. (FOR ICD-10 E11.65) 1 each 5  . blood glucose meter kit and supplies 1 each by Other route 4 (four) times daily. Dispense based on patient and insurance preference. Use up to four times daily as directed. (FOR ICD-10 E10.9, E11.9). 1 each 0  . carvedilol (COREG) 12.5 MG tablet TAKE 1 TABLET(12.5 MG) BY MOUTH TWICE DAILY WITH A MEAL 180 tablet 2  . Cholecalciferol (VITAMIN D3) 25 MCG (1000 UT) CAPS Take 2 capsules (2,000 Units total) by mouth daily. 60 capsule 3  . Continuous Blood Gluc Sensor (FREESTYLE LIBRE 14 DAY SENSOR) MISC Inject 1 each into the skin every 14 (fourteen) days. Use as directed to check blood glucose four times daily 2 each 3  . diltiazem (CARDIZEM CD) 120 MG 24 hr capsule Take 1 capsule (120 mg total) by mouth daily. 90 capsule 1  . fenofibrate (TRICOR) 48 MG tablet TAKE 1 TABLET(48 MG) BY MOUTH DAILY 90 tablet 0  . insulin glargine (LANTUS SOLOSTAR) 100 UNIT/ML Solostar Pen Inject 70 Units into the skin at bedtime. 45 mL 3  . insulin lispro (HUMALOG KWIKPEN) 100 UNIT/ML KwikPen ADMINISTER 10 TO 16 UNITS UNDER THE SKIN THREE TIMES DAILY BEFORE MEALS 48 mL 3  . lisinopril (ZESTRIL) 20 MG tablet TAKE 1 TABLET BY MOUTH TWICE DAILY 180 tablet 1  . Multiple Vitamin (MULTIVITAMIN) capsule Take 1 capsule by mouth daily.    . nitroGLYCERIN (NITROSTAT) 0.4 MG SL tablet PLACE 1 TABLET UNDER TONGUE IF NEEDED FOR CHEST PAIN..MAX OF 3 DOSES 25 tablet 0   . ONETOUCH VERIO test strip USE UP TO FOUR TIMES DAILY AS DIRECTED 300 strip 1  . pantoprazole (PROTONIX) 40 MG tablet Take 40 mg by mouth daily.    . vitamin C (ASCORBIC ACID) 500 MG tablet Take 500 mg by mouth daily.    . hydrochlorothiazide (MICROZIDE) 12.5 MG capsule TAKE 1 CAPSULE BY MOUTH DAILY AS NEEDED 90 capsule 1  . metFORMIN (GLUCOPHAGE) 1000 MG tablet TAKE 1 TABLET(1000 MG) BY MOUTH TWICE DAILY WITH A MEAL 180 tablet 0   No facility-administered medications prior to visit.     Allergies:   Crestor [rosuvastatin]   Social History   Socioeconomic History  . Marital status: Married    Spouse name: Not on file  . Number of children: 2  . Years of education: 79  . Highest education level: Not on file  Occupational History  . Occupation: disabled Dealer   Tobacco Use  . Smoking status: Never Smoker  . Smokeless tobacco: Never Used  Vaping Use  . Vaping Use:  Never used  Substance and Sexual Activity  . Alcohol use: Yes    Comment: social-2 beers/wk  . Drug use: No  . Sexual activity: Yes    Birth control/protection: None  Other Topics Concern  . Not on file  Social History Narrative  . Not on file   Social Determinants of Health   Financial Resource Strain: Not on file  Food Insecurity: Not on file  Transportation Needs: Not on file  Physical Activity: Not on file  Stress: Not on file  Social Connections: Not on file     Family History:  The patient's family history includes Arthritis in an other family member; Colon cancer in his father; Coronary artery disease (age of onset: 56) in his father; Diabetes in an other family member; Heart disease in an other family member; Lung cancer in his father.   ROS:   Please see the history of present illness.    All other systems are reviewed and are negative.   PHYSICAL EXAM:   VS:  BP (!) 160/64 (BP Location: Left Arm, Patient Position: Sitting)   Pulse 78   Ht '5\' 7"'  (1.702 m)   Wt 192 lb 6.4 oz (87.3 kg)    SpO2 98%   BMI 30.13 kg/m      General: Alert, oriented x3, no distress, borderline obese Head: no evidence of trauma, PERRL, EOMI, no exophtalmos or lid lag, no myxedema, no xanthelasma; normal ears, nose and oropharynx Neck: normal jugular venous pulsations and no hepatojugular reflux; brisk carotid pulses without delay and no carotid bruits Chest: clear to auscultation, no signs of consolidation by percussion or palpation, normal fremitus, symmetrical and full respiratory excursions Cardiovascular: normal position and quality of the apical impulse, regular rhythm, normal first and second heart sounds, no murmurs, rubs or gallops Abdomen: no tenderness or distention, no masses by palpation, no abnormal pulsatility or arterial bruits, normal bowel sounds, no hepatosplenomegaly Extremities: no clubbing, cyanosis or edema; 2+ radial, ulnar and brachial pulses bilaterally; 2+ right femoral, posterior tibial and dorsalis pedis pulses; 2+ left femoral, posterior tibial and dorsalis pedis pulses; no subclavian or femoral bruits Neurological: grossly nonfocal Psych: Normal mood and affect   Wt Readings from Last 3 Encounters:  07/12/20 192 lb 6.4 oz (87.3 kg)  04/14/20 194 lb (88 kg)  01/13/20 193 lb 3.2 oz (87.6 kg)      Studies/Labs Reviewed:   EKG:  EKG is ordered today.  The ekg ordered today borderline obese normal sinus rhythm and is a completely normal tracing   Recent Labs: 01/06/2020: ALT 22; BUN 18; Creatinine, Ser 1.05; Potassium 4.3; Sodium 140; TSH 3.800   Lipid Panel    Component Value Date/Time   CHOL 168 01/06/2020 0905   TRIG 278 (H) 01/06/2020 0905   HDL 32 (L) 01/06/2020 0905   CHOLHDL 4.8 01/22/2017 1039   CHOLHDL 7.0 12/29/2012 0438   VLDL UNABLE TO CALCULATE IF TRIGLYCERIDE OVER 400 mg/dL 12/29/2012 0438   LDLCALC 89 01/06/2020 0905   11/27/2018 Chol 179, HDL 27, TG 408 Creat 0.97 12/24/2018 A1c 7.3% 04/14/2020  hemoglobin A1c 7.2%  Additional studies/  records that were reviewed today include:    ASSESSMENT:    1. Chronic diastolic heart failure ([place])   2. Coronary artery disease involving coronary bypass graft of native heart without angina pectoris   3. Essential hypertension   4. Mixed hyperlipidemia   5. Type 2 diabetes mellitus with complication, with long-term current use of insulin ([place])  6. Mild obesity      PLAN:  In order of problems listed above:  1. CHF: Currently asymptomatic (NYHA functional class I), euvolemic without the need for loop diuretics.  He did not tolerate full dose carvedilol because of severe reduction in energy/fatigue.  Recommend that we consider adding [place]. 2. CAD s/p CABG 2006, PCI-RCA 2014: Angina free, asymptomatic with activity.  Known to be a clopidogrel nonresponder. 3. HTN: Blood pressure has been high and was also elevated in November at 161/82.  We'll increase his hydrochlorothiazide.  He did not tolerate higher doses of beta-blocker and is on a maximum dose of ACE inhibitor.  Also takes diltiazem. 4. HLP: On atorvastatin + fibrate + omega-3 FFA. Did not tolerate rosuvastatin or higher doses of atorvastatin.  Triglycerides are the best they've been in years as of the labs performed in August.  Target LDL less than 70, will repeat fasting labs in [place]. 5. DM: borderline acceptable glycemic control, considering the severity of his metabolic issues, would prefer hemoglobin A1c less than 7%.  Would recommend a SGLT2 inhibitor, since this is the only agent currently shown to improve outcomes with diastolic heart failure.  He did take [place] back in 2018-2019, after being on Invokana in 2016-2017.  Discuss restarting Wilder Glade with his endocrinologist.   6. Mild obesity: Strongly recommend starting back with regular exercise and paying more attention to avoiding sweets and starches with high glycemic index in his diet, in order to lose weight.    Medication Adjustments/Labs and Tests  Ordered: Current medicines are reviewed at length with the patient today.  Concerns regarding medicines are outlined above.  Medication changes, Labs and Tests ordered today are listed in the Patient Instructions below. Patient Instructions  Medication Instructions:  INCREASE the Hydrochlorothiazide to 25 mg once daily  *If you need a refill on your cardiac medications before your next appointment, please call your pharmacy*   Lab Work: Your provider would like for you to return in a few weeks to have the following labs drawn: fasting Lipid. You do not need an appointment for the lab. Once in our office lobby there is a podium where you can sign in and ring the doorbell to alert [place] that you are here. The lab is open from 8:00 am to 4:30 pm; closed for lunch from 12:45pm-1:45pm.  If you have labs (blood work) drawn today and your tests are completely normal, you will receive your results only by: [name] MyChart Message (if you have MyChart) OR . A paper copy in the mail If you have any lab test that is abnormal or we need to change your treatment, we will call you to review the results.   Testing/Procedures: None ordered   Follow-Up: At [facility], you and your health needs are our priority.  As part of our continuing mission to provide you with exceptional heart care, we have created designated Provider Care Teams.  These Care Teams include your primary Cardiologist (physician) and Advanced Practice Providers (APPs -  Physician Assistants and Nurse Practitioners) who all work together to provide you with the care you need, when you need it.  We recommend signing up for the patient portal called "MyChart".  Sign up information is provided on this After Visit Summary.  MyChart is used to connect with patients for Virtual Visits (Telemedicine).  Patients are able to view lab/test results, encounter notes, upcoming appointments, etc.  Non-urgent messages can be sent to your provider as well.  To learn more about what you can do with MyChart, go to [email or web address].    Your next appointment:   6 month(s)  The format for your next appointment:   In Person  Provider:   You may see Sanda Klein, MD or one of the following Advanced Practice Providers on your designated Care Team:    Almyra Deforest, PA-C  Fabian Sharp, [place] or   Roby Lofts, PA-C     Signed, Sanda Klein, MD  07/17/2020 1:02 PM    [address], [place], [place]  [address] Phone: [phone]; Fax: [phone]

## 2020-07-12 NOTE — Patient Instructions (Signed)
Medication Instructions:  INCREASE the Hydrochlorothiazide to 25 mg once daily  *If you need a refill on your cardiac medications before your next appointment, please call your pharmacy*   Lab Work: Your provider would like for you to return in a few weeks to have the following labs drawn: fasting Lipid. You do not need an appointment for the lab. Once in our office lobby there is a podium where you can sign in and ring the doorbell to alert [place] that you are here. The lab is open from 8:00 am to 4:30 pm; closed for lunch from 12:45pm-1:45pm.  If you have labs (blood work) drawn today and your tests are completely normal, you will receive your results only by:  [place] (if you have MyChart) OR  A paper copy in the mail If you have any lab test that is abnormal or we need to change your treatment, we will call you to review the results.   Testing/Procedures: None ordered   Follow-Up: At [facility], you and your health needs are our priority.  As part of our continuing mission to provide you with exceptional heart care, we have created designated Provider Care Teams.  These Care Teams include your primary Cardiologist (physician) and Advanced Practice Providers (APPs -  Physician Assistants and Nurse Practitioners) who all work together to provide you with the care you need, when you need it.  We recommend signing up for the patient portal called "MyChart".  Sign up information is provided on this After Visit Summary.  MyChart is used to connect with patients for Virtual Visits (Telemedicine).  Patients are able to view lab/test results, encounter notes, upcoming appointments, etc.  Non-urgent messages can be sent to your provider as well.   To learn more about what you can do with MyChart, go to [email or web address].    Your next appointment:   6 month(s)  The format for your next appointment:   In Person  Provider:   You may see Sanda Klein, MD or one of the  following Advanced Practice Providers on your designated Care Team:    Almyra Deforest, PA-C  Fabian Sharp, PA-C or   Roby Lofts, [place]

## 2020-07-12 NOTE — Telephone Encounter (Signed)
Patient's wife, Manuela Schwartz said that the pharmacy keeps telling her to call us about his metformin. Can you resend it?

## 2020-08-02 ENCOUNTER — Telehealth: Payer: Self-pay | Admitting: Cardiovascular Disease

## 2020-08-02 MED ORDER — NITROGLYCERIN 0.4 MG SL SUBL: SUBLINGUAL_TABLET | SUBLINGUAL | 6 refills | Status: AC

## 2020-08-02 NOTE — Telephone Encounter (Signed)
*  STAT* If patient is at the pharmacy, call can be transferred to refill team.   1. Which medications need to be refilled? (please list name of each medication and dose if known) new prescription for Nitroglycerin  2. Which pharmacy/location (including street and city if local pharmacy) is medication to be sent to? [facility] [address], [place]  3. Do they need a 30 day or 90 day supply?

## 2020-08-05 DIAGNOSIS — I5032 Chronic diastolic (congestive) heart failure: Secondary | ICD-10-CM | POA: Diagnosis not present

## 2020-08-05 DIAGNOSIS — E1159 Type 2 diabetes mellitus with other circulatory complications: Secondary | ICD-10-CM | POA: Diagnosis not present

## 2020-08-05 DIAGNOSIS — E782 Mixed hyperlipidemia: Secondary | ICD-10-CM | POA: Diagnosis not present

## 2020-08-06 LAB — COMPREHENSIVE METABOLIC PANEL
ALT: 28 IU/L (ref 0–44)
AST: 21 IU/L (ref 0–40)
Albumin/Globulin Ratio: 1.7 (ref 1.2–2.2)
Albumin: 4.3 g/dL (ref 3.8–4.8)
Alkaline Phosphatase: 88 IU/L (ref 44–121)
BUN/Creatinine Ratio: 15 (ref 10–24)
BUN: 16 mg/dL (ref 8–27)
Bilirubin Total: 0.2 mg/dL (ref 0.0–1.2)
CO2: 21 mmol/L (ref 20–29)
Calcium: 9.6 mg/dL (ref 8.6–10.2)
Chloride: 102 mmol/L (ref 96–106)
Creatinine, Ser: 1.1 mg/dL (ref 0.76–1.27)
Globulin, Total: 2.5 g/dL (ref 1.5–4.5)
Glucose: 162 mg/dL — ABNORMAL HIGH (ref 65–99)
Potassium: 4.8 mmol/L (ref 3.5–5.2)
Sodium: 140 mmol/L (ref 134–144)
Total Protein: 6.8 g/dL (ref 6.0–8.5)
eGFR: 75 mL/min/{1.73_m2} (ref >59)

## 2020-08-06 LAB — LIPID PANEL
Chol/HDL Ratio: 6.9 ratio — ABNORMAL HIGH (ref 0.0–5.0)
Cholesterol, Total: 233 mg/dL — ABNORMAL HIGH (ref 100–199)
HDL: 34 mg/dL — ABNORMAL LOW (ref >39)
LDL Chol Calc (NIH): 136 mg/dL — ABNORMAL HIGH (ref 0–99)
Triglycerides: 346 mg/dL — ABNORMAL HIGH (ref 0–149)
VLDL Cholesterol Cal: 63 mg/dL — ABNORMAL HIGH (ref 5–40)

## 2020-08-12 ENCOUNTER — Encounter: Payer: Self-pay | Admitting: Nurse Practitioner

## 2020-08-12 ENCOUNTER — Other Ambulatory Visit: Payer: Self-pay

## 2020-08-12 ENCOUNTER — Ambulatory Visit (INDEPENDENT_AMBULATORY_CARE_PROVIDER_SITE_OTHER): Payer: Medicare Other | Admitting: Nurse Practitioner

## 2020-08-12 VITALS — BP 161/93 | HR 84 | Ht 67.0 in | Wt 191.4 lb

## 2020-08-12 DIAGNOSIS — I10 Essential (primary) hypertension: Secondary | ICD-10-CM | POA: Diagnosis not present

## 2020-08-12 DIAGNOSIS — E782 Mixed hyperlipidemia: Secondary | ICD-10-CM | POA: Diagnosis not present

## 2020-08-12 DIAGNOSIS — E1159 Type 2 diabetes mellitus with other circulatory complications: Secondary | ICD-10-CM | POA: Diagnosis not present

## 2020-08-12 LAB — POCT GLYCOSYLATED HEMOGLOBIN (HGB A1C): HbA1c, POC (controlled diabetic range): 7 % (ref 0.0–7.0)

## 2020-08-12 NOTE — Progress Notes (Signed)
08/12/2020   Endocrinology follow-up note   Subjective:    Patient ID: Francis Hall, male    DOB: Jun 20, 1955,    Past Medical History:  Diagnosis Date  . Atrial fibrillation ([place])   . CAD (coronary artery disease)    [facility]  . CHF (congestive heart failure) ([place]) 11/08/2010   2D Echo EF=>55%  . Diabetes mellitus    insulin  . Diverticulosis   . Dyspnea   . Family hx of colon cancer   . GERD (gastroesophageal reflux disease)   . HTN (hypertension)   . Hyperlipemia   . Hyperplastic colon polyp 07/16/07   30cm   . Myocardial infarction ([place]) 12/2012  . OSA (obstructive sleep apnea) 12/21/2007   sleep study perform REM 70mns REM and NREM was 95.0%, AHI of 12.31hr and RDI of 12.4hr   Past Surgical History:  Procedure Laterality Date  . CARDIAC CATHETERIZATION  2006 2011   2 stents  . CATARACT EXTRACTION W/PHACO Left 12/03/2013   Procedure: CATARACT EXTRACTION PHACO AND INTRAOCULAR LENS PLACEMENT (IOC);  Surgeon: KTonny Branch MD;  Location: [facility];  Service: Ophthalmology;  Laterality: Left;  CDE:7.07  . CATARACT EXTRACTION W/PHACO Right 12/14/2013   Procedure: CATARACT EXTRACTION PHACO AND INTRAOCULAR LENS PLACEMENT (IOC);  Surgeon: KTonny Branch MD;  Location: [facility];  Service: Ophthalmology;  Laterality: Right;  CDE 7.84   . COLONOSCOPY  07/2007   Dr JArnoldo Morale hyperplastic polyp 30cm  . COLONOSCOPY N/A 08/12/2012   Procedure: COLONOSCOPY;  Surgeon: SDanie Binder MD;  Location: [facility];  Service: Endoscopy;  Laterality: N/A;  8:30  . COLONOSCOPY N/A 11/11/2017   Dr. FOneida Alar multiple tubular adenomas, next surveillance in 2022.   .Marland KitchenCORONARY ARTERY BYPASS GRAFT  06/2004   x2 performed for 40% left main stenosis, 80% proximal LAD stenosis of the first diagonal artery, and 50% left circumfles artery priximal stenosis.LIMA to LAD and free radial artery to circumflex coronary artery  . LEFT HEART CATHETERIZATION WITH CORONARY/GRAFT ANGIOGRAM  12/29/2012   Procedure:  LEFT HEART CATHETERIZATION WITH CBeatrix Fetters  Surgeon: DLeonie Man MD;  [facility];  Service: Cardiovascular;;  . POLYPECTOMY  11/11/2017   Procedure: POLYPECTOMY;  Surgeon: FDanie Binder MD;  Location: [facility];  Service: Endoscopy;;  ascending colon, transverse, splenic flexure, sigmoid  . TONSILLECTOMY  1962   Social History   Socioeconomic History  . Marital status: Married    Spouse name: Not on file  . Number of children: 2  . Years of education: 150 . Highest education level: Not on file  Occupational History  . Occupation: disabled mDealer  Tobacco Use  . Smoking status: Never Smoker  . Smokeless tobacco: Never Used  Vaping Use  . Vaping Use: Never used  Substance and Sexual Activity  . Alcohol use: Yes    Comment: social-2 beers/wk  . Drug use: No  . Sexual activity: Yes    Birth control/protection: None  Other Topics Concern  . Not on file  Social History Narrative  . Not on file   Social Determinants of Health   Financial Resource Strain: Not on file  Food Insecurity: Not on file  Transportation Needs: Not on file  Physical Activity: Not on file  Stress: Not on file  Social Connections: Not on file   Outpatient Encounter Medications as of 08/12/2020  Medication Sig  . acetaminophen (TYLENOL) 325 MG tablet Take 650 mg  by mouth every 6 (six) hours as needed for moderate pain or headache.  [name] aspirin 81 MG EC tablet Take 81 mg by mouth daily.  [name] atorvastatin (LIPITOR) 20 MG tablet TAKE 1 TABLET BY MOUTH EVERYDAY AT BEDTIME  . blood glucose meter kit and supplies KIT Dispense based on patient and insurance preference. Use up to four times daily as directed. (FOR ICD-10 E11.65)  . blood glucose meter kit and supplies 1 each by Other route 4 (four) times daily. Dispense based on patient and insurance preference. Use up to four times daily as directed. (FOR ICD-10 E10.9, E11.9).  . carvedilol (COREG) 12.5 MG tablet TAKE 1  TABLET(12.5 MG) BY MOUTH TWICE DAILY WITH A MEAL  . Cholecalciferol (VITAMIN D3) 25 MCG (1000 UT) CAPS Take 2 capsules (2,000 Units total) by mouth daily.  . Continuous Blood Gluc Sensor (FREESTYLE LIBRE 14 DAY SENSOR) MISC Inject 1 each into the skin every 14 (fourteen) days. Use as directed to check blood glucose four times daily  . diltiazem (CARDIZEM CD) 120 MG 24 hr capsule Take 1 capsule (120 mg total) by mouth daily.  . fenofibrate (TRICOR) 48 MG tablet TAKE 1 TABLET(48 MG) BY MOUTH DAILY  . hydrochlorothiazide (HYDRODIURIL) 25 MG tablet Take 1 tablet (25 mg total) by mouth daily.  . insulin glargine (LANTUS SOLOSTAR) 100 UNIT/ML Solostar Pen Inject 70 Units into the skin at bedtime.  . insulin lispro (HUMALOG KWIKPEN) 100 UNIT/ML KwikPen ADMINISTER 10 TO 16 UNITS UNDER THE SKIN THREE TIMES DAILY BEFORE MEALS  . lisinopril (ZESTRIL) 20 MG tablet TAKE 1 TABLET BY MOUTH TWICE DAILY  . metFORMIN (GLUCOPHAGE) 1000 MG tablet TAKE 1 TABLET(1000 MG) BY MOUTH TWICE DAILY WITH A MEAL  . Multiple Vitamin (MULTIVITAMIN) capsule Take 1 capsule by mouth daily.  . nitroGLYCERIN (NITROSTAT) 0.4 MG SL tablet PLACE 1 TABLET UNDER TONGUE IF NEEDED FOR CHEST PAIN..MAX OF 3 DOSES  . ONETOUCH VERIO test strip USE UP TO FOUR TIMES DAILY AS DIRECTED  . pantoprazole (PROTONIX) 40 MG tablet Take 40 mg by mouth daily.  . vitamin C (ASCORBIC ACID) 500 MG tablet Take 500 mg by mouth daily.   No facility-administered encounter medications on file as of 08/12/2020.   ALLERGIES: Allergies  Allergen Reactions  . Crestor [Rosuvastatin] Other (See Comments)    myalgia   VACCINATION STATUS:  There is no immunization history on file for this patient.  Diabetes He presents for his follow-up diabetic visit. He has type 2 diabetes mellitus. Onset time: He was diagnosed at approximate age of 77 years. His disease course has been improving. There are no hypoglycemic associated symptoms. Pertinent negatives for  hypoglycemia include no confusion, pallor or seizures. There are no diabetic associated symptoms. Pertinent negatives for diabetes include no fatigue, no polydipsia, no polyphagia, no polyuria and no weakness. There are no hypoglycemic complications. Symptoms are improving. Diabetic complications include heart disease and peripheral neuropathy. Risk factors for coronary artery disease include dyslipidemia, diabetes mellitus, hypertension, male sex, sedentary lifestyle and obesity. Current diabetic treatment includes intensive insulin program and oral agent (monotherapy). He is compliant with treatment most of the time. His weight is decreasing steadily. He is following a generally unhealthy diet. When asked about meal planning, he reported none. He has had a previous visit with a dietitian. He participates in exercise intermittently. His home blood glucose trend is fluctuating minimally. His breakfast blood glucose range is generally 140-180 mg/dl. His lunch blood glucose range is generally 110-130 mg/dl. His dinner  blood glucose range is generally 110-130 mg/dl. (He presents today, accompanied by his wife, with his logs, no meter, showing stable glycemic profile overall.  His POCT A1c today is 7%, improving from last visit of 7.3%.  He denies any major hypoglycemia.) An ACE inhibitor/angiotensin II receptor blocker is being taken. He does not see a podiatrist.Eye exam is current.  Hyperlipidemia This is a chronic problem. The current episode started more than 1 year ago. The problem is controlled. Recent lipid tests were reviewed and are variable. Exacerbating diseases include chronic renal disease, diabetes and obesity. Factors aggravating his hyperlipidemia include beta blockers and thiazides. Pertinent negatives include no myalgias. Current antihyperlipidemic treatment includes statins and fibric acid derivatives. The current treatment provides moderate improvement of lipids. There are no compliance problems.   Risk factors for coronary artery disease include diabetes mellitus, dyslipidemia, hypertension, male sex, a sedentary lifestyle, obesity and family history.     Review of systems  Constitutional: + Minimally fluctuating body weight,  current Body mass index is 29.98 kg/m. , no fatigue, no subjective hyperthermia, no subjective hypothermia Eyes: no blurry vision, no xerophthalmia ENT: no sore throat, no nodules palpated in throat, no dysphagia/odynophagia, no hoarseness Cardiovascular: no chest pain, no shortness of breath, no palpitations, no leg swelling Respiratory: no cough, no shortness of breath Gastrointestinal: no nausea/vomiting/diarrhea Musculoskeletal: no muscle/joint aches Skin: no rashes, no hyperemia Neurological: no tremors, no numbness, no tingling, no dizziness Psychiatric: no depression, no anxiety   Objective:    BP (!) 161/93 (BP Location: Left Arm)   Pulse 84   Ht _0  (1.702 m)   Wt 191 lb 6.4 oz (86.8 kg)   BMI 29.98 kg/m   Wt Readings from Last 3 Encounters:  08/12/20 191 lb 6.4 oz (86.8 kg)  07/12/20 192 lb 6.4 oz (87.3 kg)  04/14/20 194 lb (88 kg)    BP Readings from Last 3 Encounters:  08/12/20 (!) 161/93  07/12/20 (!) 160/64  04/14/20 (!) 161/82      Physical Exam- Limited  Constitutional:  Body mass index is 29.98 kg/m. , not in acute distress, normal state of mind Eyes:  EOMI, no exophthalmos Neck: Supple Cardiovascular: RRR, no murmers, rubs, or gallops, no edema Respiratory: Adequate breathing efforts, no crackles, rales, rhonchi, or wheezing Musculoskeletal: no gross deformities, strength intact in all four extremities, no gross restriction of joint movements Skin:  no rashes, no hyperemia Neurological: no tremor with outstretched hands   POCT ABI Results 08/12/20   Right ABI:  1.28      Left ABI:  1.24  Right leg systolic / diastolic: 747/18 mmHg Left leg systolic / diastolic: 550/15 mmHg  Arm systolic / diastolic: 868/25  mmHG  Detailed report will be scanned into patient chart.   Results for orders placed or performed in visit on 08/12/20  HgB A1c  Result Value Ref Range   Hemoglobin A1C     HbA1c POC (<> result, manual entry)     HbA1c, POC (prediabetic range)     HbA1c, POC (controlled diabetic range) 7.0 0.0 - 7.0 %   Diabetic Labs (most recent): Lab Results  Component Value Date   HGBA1C 7.0 08/12/2020   HGBA1C 7.2 (A) 04/14/2020   HGBA1C 7.3 (A) 01/13/2020   Lipid Panel     Component Value Date/Time   CHOL 233 (H) 08/05/2020 0821   TRIG 346 (H) 08/05/2020 0821   HDL 34 (L) 08/05/2020 0821   CHOLHDL 6.9 (H) 08/05/2020 7493  CHOLHDL 7.0 12/29/2012 0438   VLDL UNABLE TO CALCULATE IF TRIGLYCERIDE OVER 400 mg/dL 12/29/2012 0438   LDLCALC 136 (H) 08/05/2020 0821    CMP Latest Ref Rng & Units 08/05/2020 01/06/2020 04/27/2019  Glucose 65 - 99 mg/dL 162(H) 159(H) 101(H)  BUN 8 - 27 mg/dL _0 Creatinine 0.76 - 1.27 mg/dL 1.10 1.05 1.01  Sodium 134 - 144 mmol/L 140 140 139  Potassium 3.5 - 5.2 mmol/L 4.8 4.3 4.5  Chloride 96 - 106 mmol/L 102 105 101  CO2 20 - 29 mmol/L _1 Calcium 8.6 - 10.2 mg/dL 9.6 9.1 9.2  Total Protein 6.0 - 8.5 g/dL 6.8 6.5 6.3  Total Bilirubin 0.0 - 1.2 mg/dL 0.2 0.3 0.5  Alkaline Phos 44 - 121 IU/L 88 81 86  AST 0 - 40 IU/L _2 ALT 0 - 44 IU/L _3 Assessment & Plan:   1) Type 2 diabetes mellitus with vascular disease ([place])  His diabetes is  complicated by coronary artery disease status post CABG in 2006 and recent stent placement in 2014.  He presents today, accompanied by his wife, with his logs, no meter, showing stable glycemic profile overall.  His POCT A1c today is 7%, improving from last visit of 7.3%.  He denies any major hypoglycemia.  -Recent labs reviewed.  - Francis Hall remains at a high risk for more acute and chronic complications of diabetes which include CAD, CVA, CKD, retinopathy, and neuropathy. These are all discussed  in detail with the patient.  - Nutritional counseling repeated at each appointment due to patients tendency to fall back in to old habits.  - The patient admits there is a room for improvement in their diet and drink choices. -  Suggestion is made for the patient to avoid simple carbohydrates from their diet including Cakes, Sweet Desserts / Pastries, Ice Cream, Soda (diet and regular), Sweet Tea, Candies, Chips, Cookies, Sweet Pastries,  Store Bought Juices, Alcohol in Excess of  1-2 drinks a day, Artificial Sweeteners, Coffee Creamer, and "Sugar-free" Products. This will help patient to have stable blood glucose profile and potentially avoid unintended weight gain.   - I encouraged the patient to switch to  unprocessed or minimally processed complex starch and increased protein intake (animal or plant source), fruits, and vegetables.   - Patient is advised to stick to a routine mealtimes to eat 3 meals  a day and avoid unnecessary snacks ( to snack only to correct hypoglycemia).  - I have approached patient with the following individualized plan to manage diabetes and patient agrees.  -Based on his stable glycemic profile, no changes will be made to his regimen today.  He is advised to continue Novolog 10-16 units TID with meals if glucose is above 90 and he is eating.  Specific instructions on how to titrate insulin dose based on glucose readings given to patient in writing.  He is advised to continue Lantus 70 units SQ nightly and Metformin 1000 mg po twice daily with meals.  -He is encouraged to continue to monitor glucose 4 times daily, before meals and before bed, and call the clinic if he has readings less than 70 or greater than 300 for 3 tests in a row.  2) Lipids/HPL:  His most recent lipid panel from 08/05/20 shows uncontrolled LDL at 136 and elevated triglycerides at 346 (worsening).  He is advised to continue Lipitor 20 mg po daily at bedtime and  Fenofibrate 48 mg po daily.  Side  effects and precautions discussed with him.  He is advised to avoid fried foods and butter.  3) Hypertension:  His blood pressure is still not controlled to target.  He is advised to continue Coreg 12.5 mg po twice daily, Diltiazem 120 mg po daily, HCTZ 12.5 mg po daily, and Lisinopril 20 mg po daily.    4) Weight management- His Body mass index is 29.98 kg/m.  He will benefit from moderate weight loss.  Exercise, carbs management discussed with him.  I advised patient to maintain close follow up with Dr. Sharilyn Sites for primary care needs.  He is  also advised to maintain close follow-up with his cardiologist.  - Time spent on this patient care encounter:  40 min, of which > 50% was spent in  counseling and the rest reviewing his blood glucose logs , discussing his hypoglycemia and hyperglycemia episodes, reviewing his current and  previous labs / studies  ( including abstraction from other facilities) and medications  doses and developing a  long term treatment plan and documenting his care.   Please refer to Patient Instructions for Blood Glucose Monitoring and Insulin/Medications Dosing Guide"  in media tab for additional information. Please  also refer to " Patient Self Inventory" in the Media  tab for reviewed elements of pertinent patient history.  Francis Hall participated in the discussions, expressed understanding, and voiced agreement with the above plans.  All questions were answered to his satisfaction. he is encouraged to contact clinic should he have any questions or concerns prior to his return visit.   Follow up plan: Return in about 4 months (around 12/12/2020) for Diabetes follow up- A1c and urine micro in office, No previsit labs, Bring glucometer and logs.  Rayetta Pigg, [facility] [address] [place], [place] [address] Phone: [phone] Fax: [phone]  08/12/2020, 10:56 AM

## 2020-08-12 NOTE — Patient Instructions (Signed)

## 2020-08-26 ENCOUNTER — Other Ambulatory Visit: Payer: Self-pay | Admitting: Cardiovascular Disease

## 2020-08-31 DIAGNOSIS — I10 Essential (primary) hypertension: Secondary | ICD-10-CM | POA: Diagnosis not present

## 2020-08-31 DIAGNOSIS — E6609 Other obesity due to excess calories: Secondary | ICD-10-CM | POA: Diagnosis not present

## 2020-08-31 DIAGNOSIS — E7849 Other hyperlipidemia: Secondary | ICD-10-CM | POA: Diagnosis not present

## 2020-08-31 DIAGNOSIS — E119 Type 2 diabetes mellitus without complications: Secondary | ICD-10-CM | POA: Diagnosis not present

## 2020-09-06 ENCOUNTER — Ambulatory Visit: Payer: Medicare Other

## 2020-10-03 ENCOUNTER — Telehealth: Payer: Self-pay | Admitting: Cardiovascular Disease

## 2020-10-03 MED ORDER — FENOFIBRATE 48 MG PO TABS: ORAL_TABLET | ORAL | 0 refills | Status: DC

## 2020-10-03 NOTE — Telephone Encounter (Signed)
*  STAT* If patient is at the pharmacy, call can be transferred to refill team.   1. Which medications need to be refilled? (please list name of each medication and dose if known)  fenofibrate (TRICOR) 48 MG tablet  2. Which pharmacy/location (including street and city if local pharmacy) is medication to be sent to? [id number] - [place], [place] [address]  3. Do they need a 30 day or 90 day supply? 90 day  Patient is completely out. Patient ran out of medication Friday.

## 2020-10-20 ENCOUNTER — Other Ambulatory Visit: Payer: Self-pay | Admitting: Cardiovascular Disease

## 2020-10-20 ENCOUNTER — Other Ambulatory Visit: Payer: Self-pay | Admitting: Nurse Practitioner

## 2020-10-20 DIAGNOSIS — E1159 Type 2 diabetes mellitus with other circulatory complications: Secondary | ICD-10-CM

## 2020-11-01 DIAGNOSIS — I10 Essential (primary) hypertension: Secondary | ICD-10-CM | POA: Diagnosis not present

## 2020-11-01 DIAGNOSIS — E782 Mixed hyperlipidemia: Secondary | ICD-10-CM | POA: Diagnosis not present

## 2020-11-01 DIAGNOSIS — E119 Type 2 diabetes mellitus without complications: Secondary | ICD-10-CM | POA: Diagnosis not present

## 2020-11-17 ENCOUNTER — Encounter: Payer: Self-pay | Admitting: Internal Medicine

## 2020-11-17 ENCOUNTER — Other Ambulatory Visit: Payer: Self-pay | Admitting: Cardiology

## 2020-12-12 ENCOUNTER — Ambulatory Visit: Payer: Medicare Other

## 2020-12-15 ENCOUNTER — Encounter: Payer: Self-pay | Admitting: Nurse Practitioner

## 2020-12-15 ENCOUNTER — Ambulatory Visit (INDEPENDENT_AMBULATORY_CARE_PROVIDER_SITE_OTHER): Payer: Medicare Other | Admitting: Nurse Practitioner

## 2020-12-15 ENCOUNTER — Ambulatory Visit (INDEPENDENT_AMBULATORY_CARE_PROVIDER_SITE_OTHER): Payer: Self-pay | Admitting: *Deleted

## 2020-12-15 ENCOUNTER — Other Ambulatory Visit: Payer: Self-pay

## 2020-12-15 VITALS — Ht 68.0 in | Wt 180.2 lb

## 2020-12-15 VITALS — BP 160/75 | HR 66 | Ht 67.0 in | Wt 181.0 lb

## 2020-12-15 DIAGNOSIS — E782 Mixed hyperlipidemia: Secondary | ICD-10-CM | POA: Diagnosis not present

## 2020-12-15 DIAGNOSIS — Z860100 Personal history of colon polyps, unspecified: Secondary | ICD-10-CM

## 2020-12-15 DIAGNOSIS — I10 Essential (primary) hypertension: Secondary | ICD-10-CM | POA: Diagnosis not present

## 2020-12-15 DIAGNOSIS — E1159 Type 2 diabetes mellitus with other circulatory complications: Secondary | ICD-10-CM

## 2020-12-15 LAB — POCT GLYCOSYLATED HEMOGLOBIN (HGB A1C): Hemoglobin A1C: 7 % — AB (ref 4.0–5.6)

## 2020-12-15 MED ORDER — PEG 3350-KCL-NA BICARB-NACL 420 G PO SOLR: 4000.0000 mL | Freq: Once | ORAL | 0 refills | Status: AC

## 2020-12-15 MED ORDER — LANTUS SOLOSTAR 100 UNIT/ML ~~LOC~~ SOPN: PEN_INJECTOR | SUBCUTANEOUS | 3 refills | Status: DC

## 2020-12-15 MED ORDER — METFORMIN HCL 1000 MG PO TABS: ORAL_TABLET | ORAL | 3 refills | Status: DC

## 2020-12-15 NOTE — Patient Instructions (Signed)

## 2020-12-15 NOTE — Progress Notes (Signed)
Gastroenterology Pre-Procedure Review  Request Date: 12/15/2020 Requesting Physician: 3 year recall, Last TCS 11/11/2017 done by Dr. Oneida Alar, tubular adenoma (x4)  PATIENT REVIEW QUESTIONS: The patient responded to the following health history questions as indicated:    1. Diabetes Melitis: yes, type II 2. Joint replacements in the past 12 months: no 3. Major health problems in the past 3 months: no 4. Has an artificial valve or MVP: no 5. Has a defibrillator: no 6. Has been advised in past to take antibiotics in advance of a procedure like teeth cleaning: no 7. Family history of colon cancer: no  8. Alcohol Use: yes, 2 beers a month 9. Illicit drug Use: no 10. History of sleep apnea: yes, but doesn't use CPAP anymore 11. History of coronary artery or other vascular stents placed within the last 12 months: no 12. History of any prior anesthesia complications: no 13. Body mass index is 27.4 kg/m.    MEDICATIONS & ALLERGIES:    Patient reports the following regarding taking any blood thinners:   Plavix? no Aspirin? yes, 81 mg Coumadin? no Brilinta? no Xarelto? no Eliquis? no Pradaxa? no Savaysa? no Effient? no  Patient confirms/reports the following medications:  Current Outpatient Medications  Medication Sig Dispense Refill   acetaminophen (TYLENOL) 325 MG tablet Take 650 mg by mouth as needed for moderate pain or headache.     aspirin 81 MG EC tablet Take 81 mg by mouth daily.     atorvastatin (LIPITOR) 20 MG tablet TAKE 1 TABLET BY MOUTH EVERY DAY AT BEDTIME 90 tablet 3   blood glucose meter kit and supplies KIT Dispense based on patient and insurance preference. Use up to four times daily as directed. (FOR ICD-10 E11.65) 1 each 5   blood glucose meter kit and supplies 1 each by Other route 4 (four) times daily. Dispense based on patient and insurance preference. Use up to four times daily as directed. (FOR ICD-10 E10.9, E11.9). 1 each 0   carvedilol (COREG) 12.5 MG tablet  TAKE 1 TABLET(12.5 MG) BY MOUTH TWICE DAILY WITH A MEAL 180 tablet 0   Cholecalciferol (VITAMIN D3) 25 MCG (1000 UT) CAPS Take 2 capsules (2,000 Units total) by mouth daily. 60 capsule 3   Continuous Blood Gluc Sensor (FREESTYLE LIBRE 14 DAY SENSOR) MISC Inject 1 each into the skin every 14 (fourteen) days. Use as directed to check blood glucose four times daily 2 each 3   diltiazem (CARDIZEM CD) 120 MG 24 hr capsule Take 1 capsule (120 mg total) by mouth daily. 90 capsule 1   fenofibrate (TRICOR) 48 MG tablet TAKE 1 TABLET(48 MG) BY MOUTH DAILY 90 tablet 0   hydrochlorothiazide (HYDRODIURIL) 25 MG tablet TAKE 1 TABLET(25 MG) BY MOUTH DAILY 90 tablet 0   insulin glargine (LANTUS SOLOSTAR) 100 UNIT/ML Solostar Pen ADMINISTER 70 UNITS UNDER THE SKIN AT BEDTIME 60 mL 3   insulin lispro (HUMALOG KWIKPEN) 100 UNIT/ML KwikPen ADMINISTER 10 TO 16 UNITS UNDER THE SKIN THREE TIMES DAILY BEFORE MEALS 48 mL 3   lisinopril (ZESTRIL) 20 MG tablet TAKE 1 TABLET BY MOUTH TWICE DAILY 180 tablet 1   metFORMIN (GLUCOPHAGE) 1000 MG tablet TAKE 1 TABLET(1000 MG) BY MOUTH TWICE DAILY WITH A MEAL 180 tablet 3   Multiple Vitamin (MULTIVITAMIN) capsule Take 1 capsule by mouth daily.     nitroGLYCERIN (NITROSTAT) 0.4 MG SL tablet PLACE 1 TABLET UNDER TONGUE IF NEEDED FOR CHEST PAIN.[name] OF 3 DOSES 25 tablet 6   ONETOUCH VERIO test  strip USE UP TO FOUR TIMES DAILY AS DIRECTED 300 strip 1   pantoprazole (PROTONIX) 40 MG tablet Take 40 mg by mouth daily.     vitamin C (ASCORBIC ACID) 500 MG tablet Take 500 mg by mouth daily.     No current facility-administered medications for this visit.    Patient confirms/reports the following allergies:  Allergies  Allergen Reactions   Crestor [Rosuvastatin] Other (See Comments)    myalgia    No orders of the defined types were placed in this encounter.   AUTHORIZATION INFORMATION Primary Insurance: BC[employer] Medicare,  ID #: [id number],  Group #: [id number] Pre-Cert / Auth  required: No, not required  SCHEDULE INFORMATION: Procedure has been scheduled as follows:  Date: 01/10/2021, Time: 8:45  Location: [facility] with Dr. Abbey Chatters  This Gastroenterology Pre-Precedure Review Form is being routed to the following provider(s): Roseanne Kaufman, NP

## 2020-12-15 NOTE — Progress Notes (Signed)
12/15/2020   Endocrinology follow-up note   Subjective:    Patient ID: Francis Hall, male    DOB: 07/01/1955,    Past Medical History:  Diagnosis Date   Atrial fibrillation ([place])    CAD (coronary artery disease)    [place]   CHF (congestive heart failure) ([place]) 11/08/2010   2D Echo EF=>55%   Diabetes mellitus    insulin   Diverticulosis    Dyspnea    Family hx of colon cancer    GERD (gastroesophageal reflux disease)    HTN (hypertension)    Hyperlipemia    Hyperplastic colon polyp 07/16/07   30cm    Myocardial infarction ([place]) 12/2012   OSA (obstructive sleep apnea) 12/21/2007   sleep study perform REM 76mns REM and NREM was 95.0%, AHI of 12.31hr and RDI of 12.4hr   Past Surgical History:  Procedure Laterality Date   CARDIAC CATHETERIZATION  2006 2011   2 stents   CATARACT EXTRACTION W/PHACO Left 12/03/2013   Procedure: CATARACT EXTRACTION PHACO AND INTRAOCULAR LENS PLACEMENT [place];  Surgeon: KTonny Branch MD;  Location: [facility];  Service: Ophthalmology;  Laterality: Left;  CDE:7.07   CATARACT EXTRACTION W/PHACO Right 12/14/2013   Procedure: CATARACT EXTRACTION PHACO AND INTRAOCULAR LENS PLACEMENT ([facility]);  Surgeon: KTonny Branch MD;  Location: [facility];  Service: Ophthalmology;  Laterality: Right;  CDE 7.84    COLONOSCOPY  07/2007   Dr JArnoldo Morale hyperplastic polyp 30cm   COLONOSCOPY N/A 08/12/2012   Procedure: COLONOSCOPY;  Surgeon: SDanie Binder MD;  Location: [facility];  Service: Endoscopy;  Laterality: N/A;  8:30   COLONOSCOPY N/A 11/11/2017   Dr. FOneida Alar multiple tubular adenomas, next surveillance in 2022.    CORONARY ARTERY BYPASS GRAFT  06/2004   x2 performed for 40% left main stenosis, 80% proximal LAD stenosis of the first diagonal artery, and 50% left circumfles artery priximal stenosis.LIMA to LAD and free radial artery to circumflex coronary artery   LEFT HEART CATHETERIZATION WITH CORONARY/GRAFT ANGIOGRAM  12/29/2012   Procedure: LEFT HEART  CATHETERIZATION WITH CBeatrix Fetters  Surgeon: DLeonie Man MD;  [facility];  Service: Cardiovascular;;   POLYPECTOMY  11/11/2017   Procedure: POLYPECTOMY;  Surgeon: FDanie Binder MD;  Location: [facility];  Service: Endoscopy;;  ascending colon, transverse, splenic flexure, sigmoid   TONSILLECTOMY  1962   Social History   Socioeconomic History   Marital status: Married    Spouse name: Not on file   Number of children: 2   Years of education: 12   Highest education level: Not on file  Occupational History   Occupation: disabled mDealer  Tobacco Use   Smoking status: Never   Smokeless tobacco: Never  Vaping Use   Vaping Use: Never used  Substance and Sexual Activity   Alcohol use: Yes    Comment: social-2 beers/wk   Drug use: No   Sexual activity: Yes    Birth control/protection: None  Other Topics Concern   Not on file  Social History Narrative   Not on file   Social Determinants of Health   Financial Resource Strain: Not on file  Food Insecurity: Not on file  Transportation Needs: Not on file  Physical Activity: Not on file  Stress: Not on file  Social Connections: Not on file   Outpatient Encounter Medications as of 12/15/2020  Medication Sig   acetaminophen (TYLENOL) 325 MG tablet Take 650 mg by mouth  as needed for moderate pain or headache.   aspirin 81 MG EC tablet Take 81 mg by mouth daily.   atorvastatin (LIPITOR) 20 MG tablet TAKE 1 TABLET BY MOUTH EVERY DAY AT BEDTIME   blood glucose meter kit and supplies KIT Dispense based on patient and insurance preference. Use up to four times daily as directed. (FOR ICD-10 E11.65)   blood glucose meter kit and supplies 1 each by Other route 4 (four) times daily. Dispense based on patient and insurance preference. Use up to four times daily as directed. (FOR ICD-10 E10.9, E11.9).   carvedilol (COREG) 12.5 MG tablet TAKE 1 TABLET(12.5 MG) BY MOUTH TWICE DAILY WITH A MEAL   Cholecalciferol  (VITAMIN D3) 25 MCG (1000 UT) CAPS Take 2 capsules (2,000 Units total) by mouth daily.   Continuous Blood Gluc Sensor (FREESTYLE LIBRE 14 DAY SENSOR) MISC Inject 1 each into the skin every 14 (fourteen) days. Use as directed to check blood glucose four times daily   diltiazem (CARDIZEM CD) 120 MG 24 hr capsule Take 1 capsule (120 mg total) by mouth daily.   fenofibrate (TRICOR) 48 MG tablet TAKE 1 TABLET(48 MG) BY MOUTH DAILY   hydrochlorothiazide (HYDRODIURIL) 25 MG tablet TAKE 1 TABLET(25 MG) BY MOUTH DAILY   insulin glargine (LANTUS SOLOSTAR) 100 UNIT/ML Solostar Pen ADMINISTER 70 UNITS UNDER THE SKIN AT BEDTIME   insulin lispro (HUMALOG KWIKPEN) 100 UNIT/ML KwikPen ADMINISTER 10 TO 16 UNITS UNDER THE SKIN THREE TIMES DAILY BEFORE MEALS   lisinopril (ZESTRIL) 20 MG tablet TAKE 1 TABLET BY MOUTH TWICE DAILY   metFORMIN (GLUCOPHAGE) 1000 MG tablet TAKE 1 TABLET(1000 MG) BY MOUTH TWICE DAILY WITH A MEAL   Multiple Vitamin (MULTIVITAMIN) capsule Take 1 capsule by mouth daily.   nitroGLYCERIN (NITROSTAT) 0.4 MG SL tablet PLACE 1 TABLET UNDER TONGUE IF NEEDED FOR CHEST PAIN.[name] OF 3 DOSES   ONETOUCH VERIO test strip USE UP TO FOUR TIMES DAILY AS DIRECTED   pantoprazole (PROTONIX) 40 MG tablet Take 40 mg by mouth daily.   vitamin C (ASCORBIC ACID) 500 MG tablet Take 500 mg by mouth daily.   [DISCONTINUED] LANTUS SOLOSTAR 100 UNIT/ML Solostar Pen ADMINISTER 70 UNITS UNDER THE SKIN AT BEDTIME   [DISCONTINUED] metFORMIN (GLUCOPHAGE) 1000 MG tablet TAKE 1 TABLET(1000 MG) BY MOUTH TWICE DAILY WITH A MEAL   No facility-administered encounter medications on file as of 12/15/2020.   ALLERGIES: Allergies  Allergen Reactions   Crestor [Rosuvastatin] Other (See Comments)    myalgia   VACCINATION STATUS:  There is no immunization history on file for this patient.  Diabetes He presents for his follow-up diabetic visit. He has type 2 diabetes mellitus. Onset time: He was diagnosed at approximate age of  28 years. His disease course has been stable. There are no hypoglycemic associated symptoms. Pertinent negatives for hypoglycemia include no confusion, pallor or seizures. There are no diabetic associated symptoms. Pertinent negatives for diabetes include no fatigue, no polydipsia, no polyphagia, no polyuria and no weakness. There are no hypoglycemic complications. Symptoms are stable. Diabetic complications include heart disease and peripheral neuropathy. Risk factors for coronary artery disease include dyslipidemia, diabetes mellitus, hypertension, male sex, sedentary lifestyle and obesity. Current diabetic treatment includes intensive insulin program and oral agent (monotherapy). He is compliant with treatment most of the time. His weight is decreasing steadily. He is following a generally healthy diet. When asked about meal planning, he reported none. He has had a previous visit with a dietitian. He participates in exercise  intermittently. His home blood glucose trend is fluctuating minimally. His breakfast blood glucose range is generally 130-140 mg/dl. His lunch blood glucose range is generally 130-140 mg/dl. His dinner blood glucose range is generally 140-180 mg/dl. (He presents today with stable glycemic profile overall.  His POCT A1c today is 7%, unchanged from previous visit.  He has had periods where he lost control and ate things he shouldn't, but he tends to always get back on track.  He denies any hypoglycemia.) An ACE inhibitor/angiotensin II receptor blocker is being taken. He does not see a podiatrist.Eye exam is current.  Hyperlipidemia This is a chronic problem. The current episode started more than 1 year ago. The problem is uncontrolled. Recent lipid tests were reviewed and are variable. Exacerbating diseases include chronic renal disease, diabetes and obesity. Factors aggravating his hyperlipidemia include beta blockers and thiazides. Pertinent negatives include no myalgias. Current  antihyperlipidemic treatment includes statins and fibric acid derivatives. The current treatment provides moderate improvement of lipids. There are no compliance problems.  Risk factors for coronary artery disease include diabetes mellitus, dyslipidemia, hypertension, male sex, a sedentary lifestyle, obesity and family history.    Review of systems  Constitutional: + steadily decreasing body weight,  current Body mass index is 28.35 kg/m. , no fatigue, no subjective hyperthermia, no subjective hypothermia Eyes: no blurry vision, no xerophthalmia ENT: no sore throat, no nodules palpated in throat, no dysphagia/odynophagia, no hoarseness Cardiovascular: no chest pain, no shortness of breath, no palpitations, no leg swelling Respiratory: no cough, no shortness of breath Gastrointestinal: no nausea/vomiting/diarrhea Musculoskeletal: no muscle/joint aches Skin: no rashes, no hyperemia Neurological: no tremors, no numbness, no tingling, no dizziness Psychiatric: no depression, no anxiety   Objective:    BP (!) 160/75   Pulse 66   Ht '5\' 7"'  (1.702 m)   Wt 181 lb (82.1 kg)   BMI 28.35 kg/m   Wt Readings from Last 3 Encounters:  12/15/20 180 lb 3.2 oz (81.7 kg)  12/15/20 181 lb (82.1 kg)  08/12/20 191 lb 6.4 oz (86.8 kg)    BP Readings from Last 3 Encounters:  12/15/20 (!) 160/75  08/12/20 (!) 161/93  07/12/20 (!) 160/64     Physical Exam- Limited  Constitutional:  Body mass index is 28.35 kg/m. , not in acute distress, normal state of mind Eyes:  EOMI, no exophthalmos Neck: Supple Cardiovascular: RRR, no murmurs, rubs, or gallops, no edema Respiratory: Adequate breathing efforts, no crackles, rales, rhonchi, or wheezing Musculoskeletal: no gross deformities, strength intact in all four extremities, no gross restriction of joint movements Skin:  no rashes, no hyperemia Neurological: no tremor with outstretched hands    Results for orders placed or performed in visit on  12/15/20  HgB A1c  Result Value Ref Range   Hemoglobin A1C 7.0 (A) 4.0 - 5.6 %   HbA1c POC (<> result, manual entry)     HbA1c, POC (prediabetic range)     HbA1c, POC (controlled diabetic range)     Diabetic Labs (most recent): Lab Results  Component Value Date   HGBA1C 7.0 (A) 12/15/2020   HGBA1C 7.0 08/12/2020   HGBA1C 7.2 (A) 04/14/2020   Lipid Panel     Component Value Date/Time   CHOL 233 (H) 08/05/2020 0821   TRIG 346 (H) 08/05/2020 0821   HDL 34 (L) 08/05/2020 0821   CHOLHDL 6.9 (H) 08/05/2020 0821   CHOLHDL 7.0 12/29/2012 0438   VLDL UNABLE TO CALCULATE IF TRIGLYCERIDE OVER 400 mg/dL 12/29/2012 0438  [place] 136 (H) 08/05/2020 0821    CMP Latest Ref Rng & Units 08/05/2020 01/06/2020 04/27/2019  Glucose 65 - 99 mg/dL 162(H) 159(H) 101(H)  BUN 8 - 27 mg/dL '16 18 14  ' Creatinine 0.76 - 1.27 mg/dL 1.10 1.05 1.01  Sodium 134 - 144 mmol/L 140 140 139  Potassium 3.5 - 5.2 mmol/L 4.8 4.3 4.5  Chloride 96 - 106 mmol/L 102 105 101  CO2 20 - 29 mmol/L '21 21 24  ' Calcium 8.6 - 10.2 mg/dL 9.6 9.1 9.2  Total Protein 6.0 - 8.5 g/dL 6.8 6.5 6.3  Total Bilirubin 0.0 - 1.2 mg/dL 0.2 0.3 0.5  Alkaline Phos 44 - 121 IU/L 88 81 86  AST 0 - 40 IU/L '21 18 20  ' ALT 0 - 44 IU/L '28 22 23    ' Assessment & Plan:   1) Type 2 diabetes mellitus with vascular disease ([place])  His diabetes is complicated by coronary artery disease status post CABG in 2006 and recent stent placement in 2014.  He presents today with stable glycemic profile overall.  His POCT A1c today is 7%, unchanged from previous visit.  He has had periods where he lost control and ate things he shouldn't, but he tends to always get back on track.  He denies any hypoglycemia.  -Recent labs reviewed.  - [name] remains at a high risk for more acute and chronic complications of diabetes which include CAD, CVA, CKD, retinopathy, and neuropathy. These are all discussed in detail with the patient.  - Nutritional counseling  repeated at each appointment due to patients tendency to fall back in to old habits.  - The patient admits there is a room for improvement in their diet and drink choices. -  Suggestion is made for the patient to avoid simple carbohydrates from their diet including Cakes, Sweet Desserts / Pastries, Ice Cream, Soda (diet and regular), Sweet Tea, Candies, Chips, Cookies, Sweet Pastries, Store Bought Juices, Alcohol in Excess of 1-2 drinks a day, Artificial Sweeteners, Coffee Creamer, and "Sugar-free" Products. This will help patient to have stable blood glucose profile and potentially avoid unintended weight gain.   - I encouraged the patient to switch to unprocessed or minimally processed complex starch and increased protein intake (animal or plant source), fruits, and vegetables.   - Patient is advised to stick to a routine mealtimes to eat 3 meals a day and avoid unnecessary snacks (to snack only to correct hypoglycemia).  - I have approached patient with the following individualized plan to manage diabetes and patient agrees.  -Based on his stable, at goal glycemic profile, no changes will be made to his regimen today.  He is advised to continue Novolog 10-16 units TID with meals if glucose is above 90 and he is eating.  Specific instructions on how to titrate insulin dose based on glucose readings given to patient in writing.  He is advised to continue Lantus 70 units SQ nightly and Metformin 1000 mg po twice daily with meals.  -He is encouraged to continue to monitor glucose 4 times daily, before meals and before bed, and call the clinic if he has readings less than 70 or greater than 300 for 3 tests in a row.  2) Lipids/HPL:  His most recent lipid panel from 08/05/20 shows uncontrolled LDL at 136 and elevated triglycerides at 346 (worsening).  He is advised to continue Lipitor 20 mg po daily at bedtime and Fenofibrate 48 mg po daily.  Side effects and precautions discussed with  him.  He is advised  to avoid fried foods and butter.  3) Hypertension:  His blood pressure is still not controlled to target.  He is advised to continue Coreg 12.5 mg po twice daily, Diltiazem 120 mg po daily, HCTZ 25 mg po daily, and Lisinopril 20 mg po daily.    4) Weight management- His Body mass index is 28.35 kg/m.  He will benefit from moderate weight loss.  Exercise, carbs management discussed with him.  I advised patient to maintain close follow up with Dr. Sharilyn Sites for primary care needs.  He is  also advised to maintain close follow-up with his cardiologist.    I spent 41 minutes in the care of the patient today including review of labs from [place], Lipids, Thyroid Function, Hematology (current and previous including abstractions from other facilities); face-to-face time discussing  his blood glucose readings/logs, discussing hypoglycemia and hyperglycemia episodes and symptoms, medications doses, his options of short and long term treatment based on the latest standards of care / guidelines;  discussion about incorporating lifestyle medicine;  and documenting the encounter.    Please refer to Patient Instructions for Blood Glucose Monitoring and Insulin/Medications Dosing Guide"  in media tab for additional information. Please  also refer to " Patient Self Inventory" in the Media  tab for reviewed elements of pertinent patient history.  Francis Hall participated in the discussions, expressed understanding, and voiced agreement with the above plans.  All questions were answered to his satisfaction. he is encouraged to contact clinic should he have any questions or concerns prior to his return visit.   Follow up plan: Return in about 4 months (around 04/17/2021) for Diabetes F/U with A1c in office, Previsit labs, Bring meter and logs.  Rayetta Pigg, [facility] [address] [place], [place] [address] Phone: [phone] Fax: [phone]  12/15/2020, 1:01 PM

## 2020-12-15 NOTE — Patient Instructions (Signed)
Francis Hall   February 01, 1956 MRN: [id number] Procedure Date: 01/10/2021 Arrival Time: 7:15 AM     Location of Procedure: [facility] Short Stay  PREPARATION FOR COLONOSCOPY WITH TRI-LYTE PREP   Please notify us immediately if you are diabetic, take iron supplements, or if you are on Coumadin or any other blood thinners.   Please hold the following medications:  See letter  You will need to purchase 1 fleet enema and 1 box of Bisacodyl 5mg  tablets.   PROCEDURE IS SCHEDULED FOR DEMARR Hall AS FOLLOWS:  Procedure Date: 01/10/2021 Time to register: 7:15 AM Place to register: [facility] Short Stay Scheduled provider: Dr. Abbey Hall   2 DAYS BEFORE PROCEDURE:  DATE: 01/08/2021   DAY: Sunday Begin clear liquid diet AFTER your lunch meal. NO SOLID FOODS!   1 DAY BEFORE PROCEDURE:  DATE: 01/09/2021   DAY: Monday  Continue clear liquids the entire day - NO SOLID FOOD.   Diabetic medications adjustments for today: See letter.  At 12:00pm (noon): Take 2 (two) Dulcolax (Bisacodyl) tablets  At 2:00pm: Start drinking your solution. Try to drink 1 (one) 8 ounce glass every 10-15 minutes, until you have consumed HALF the jug. (You should complete the first 1/2 of the jug in 2 hours. Wait 30 minutes, then drink 3-4 more glasses of the solution. Your stools should be clear; if not, you may have to consume the rest of the jug.   One hour after completing the solution: take the last 2 (two) Dulcolax (Bisacodyl) tablets, with a clear liquid.  YOU MUST DRINK PLENTY OF CLEAR LIQUIDS DURING YOUR PREP TO REDUCE RISKS OF KIDNEY FAILURE.   Continue clear liquids only, until midnight. Do not eat or drink anything after midnight.  EXCEPTION:  If you take medications for your heart, blood pressure or breathing, you may take these medications with a small amount of clear liquid.      DAY OF PROCEDURE:   DATE: 01/10/2021       DAY:  Tuesday The morning of your procedure give yourself 1 (one) Fleet Enema, at least  1 hour before going to the hospital.   You may take Tylenol products. Please continue your regular medications unless we have instructed otherwise.   Diabetic medications adjustments for today. See letter.  Someone MUST be available to drive you home; the hospital will cancel this appointment if you do not have a driver.   Please call the office if you have any questions (Dept: [phone]).  Please see below for Dietary Information.  CLEAR LIQUIDS INCLUDE:  Water Jello (NOT red in color)   Ice Popsicles (NOT red in color)   Tea (sugar ok, no milk/cream) Powdered fruit flavored drinks  Coffee (sugar ok, no milk/cream) Gatorade/ Lemonade/ Kool-Aid  (NOT red in color)   Juice: apple, white grape, white cranberry Soft drinks  Clear bullion, consomme, broth (fat free beef/chicken/vegetable)  Carbonated beverages (any kind)  Strained chicken noodle soup Hard Candy   REMEMBER: Clear liquids are liquids that will allow you to see your fingers on the other side of a clear glass. Be sure liquids are NOT red in color, and not cloudy, but CLEAR.   DO NOT EAT OR DRINK ANY OF THE FOLLOWING:  Dairy products of any kind   Cranberry juice Tomato juice / V8 juice   Grapefruit juice Orange juice     Red grape juice  Do not eat any solid foods, including such foods as: cereal, oatmeal, yogurt, fruits, vegetables,  creamed soups, eggs, bread, etc.    HELPFUL HINTS FOR DRINKING PREP SOLUTION:  Make sure prep is extremely cold. Refrigerate the night before. You may also put in the freezer.  You may try mixing some Crystal Light or Country Time Lemonade if you prefer. Mix in small amounts; add more if necessary. Try drinking through a straw Rinse mouth with water or a mouthwash between glasses, to remove after-taste. Try sipping on a cold beverage /ice/ popsicles between glasses of prep Place a piece of sugar-free hard candy in mouth between glasses If you become nauseated, try consuming smaller  amounts, or stretch out the time between glasses. Stop for 30-60 minutes, then slowly start back drinking    You may call the office (Dept: [phone]) before 5:00pm, or page the doctor on call after 5:00pm [phone]), for further instructions, if necessary.   OTHER INSTRUCTIONS  You will need a responsible adult at least 64 years of age to accompany you and drive you home. This person must remain in the waiting room during your procedure.  Wear loose fitting clothing that is easily removed.  Leave jewelry and other valuables at home.   Remove all body piercing jewelry and leave at home.  Total time from sign-in until discharge is approximately 2-3 hours.  You should go home directly after your procedure and rest. You can resume normal activities the day after your procedure.  The day of your procedure you should not: Drive Make legal decisions Operate machinery Drink alcohol Return to work

## 2020-12-23 ENCOUNTER — Other Ambulatory Visit: Payer: Self-pay | Admitting: Cardiovascular Disease

## 2020-12-29 NOTE — Progress Notes (Signed)
No oral diabetes meds day of procedure. 1/2 dose of insulin evening prior. Appropriate. ASA 2.

## 2020-12-30 ENCOUNTER — Other Ambulatory Visit: Payer: Self-pay | Admitting: *Deleted

## 2020-12-30 ENCOUNTER — Encounter: Payer: Self-pay | Admitting: *Deleted

## 2020-12-30 DIAGNOSIS — Z86010 Personal history of colonic polyps: Secondary | ICD-10-CM

## 2020-12-30 NOTE — Progress Notes (Addendum)
Mailed letter to pt with diabetes medication adjustments.  Also, called pt and informed him of how to adjust diabetes medication prior to procedure.  Pt voiced understanding.

## 2021-01-02 ENCOUNTER — Encounter: Payer: Self-pay | Admitting: *Deleted

## 2021-01-10 ENCOUNTER — Ambulatory Visit (HOSPITAL_COMMUNITY): Payer: Medicare Other | Admitting: Anesthesiology

## 2021-01-10 ENCOUNTER — Encounter (HOSPITAL_COMMUNITY)
Admission: RE | Disposition: A | Discharge status: Home / Self Care | Payer: Self-pay | Source: Home / Self Care | Attending: Internal Medicine

## 2021-01-10 ENCOUNTER — Encounter (HOSPITAL_COMMUNITY): Payer: Self-pay

## 2021-01-10 ENCOUNTER — Ambulatory Visit (HOSPITAL_COMMUNITY)
Admission: RE | Admit: 2021-01-10 | Discharge: 2021-01-10 | Disposition: A | Discharge status: Home / Self Care | Payer: Medicare Other | Attending: Internal Medicine | Admitting: Internal Medicine

## 2021-01-10 ENCOUNTER — Other Ambulatory Visit: Payer: Self-pay

## 2021-01-10 DIAGNOSIS — Z1211 Encounter for screening for malignant neoplasm of colon: Secondary | ICD-10-CM | POA: Diagnosis not present

## 2021-01-10 DIAGNOSIS — Z951 Presence of aortocoronary bypass graft: Secondary | ICD-10-CM | POA: Diagnosis not present

## 2021-01-10 DIAGNOSIS — D120 Benign neoplasm of cecum: Secondary | ICD-10-CM | POA: Diagnosis not present

## 2021-01-10 DIAGNOSIS — Z79899 Other long term (current) drug therapy: Secondary | ICD-10-CM | POA: Insufficient documentation

## 2021-01-10 DIAGNOSIS — K648 Other hemorrhoids: Secondary | ICD-10-CM | POA: Diagnosis not present

## 2021-01-10 DIAGNOSIS — K635 Polyp of colon: Secondary | ICD-10-CM | POA: Diagnosis not present

## 2021-01-10 DIAGNOSIS — D123 Benign neoplasm of transverse colon: Secondary | ICD-10-CM | POA: Insufficient documentation

## 2021-01-10 DIAGNOSIS — Z794 Long term (current) use of insulin: Secondary | ICD-10-CM | POA: Diagnosis not present

## 2021-01-10 DIAGNOSIS — Z888 Allergy status to other drugs, medicaments and biological substances status: Secondary | ICD-10-CM | POA: Diagnosis not present

## 2021-01-10 DIAGNOSIS — Z7982 Long term (current) use of aspirin: Secondary | ICD-10-CM | POA: Diagnosis not present

## 2021-01-10 DIAGNOSIS — Z86010 Personal history of colonic polyps: Secondary | ICD-10-CM | POA: Insufficient documentation

## 2021-01-10 DIAGNOSIS — I110 Hypertensive heart disease with heart failure: Secondary | ICD-10-CM | POA: Diagnosis not present

## 2021-01-10 DIAGNOSIS — K5730 Diverticulosis of large intestine without perforation or abscess without bleeding: Secondary | ICD-10-CM | POA: Insufficient documentation

## 2021-01-10 DIAGNOSIS — I509 Heart failure, unspecified: Secondary | ICD-10-CM | POA: Diagnosis not present

## 2021-01-10 DIAGNOSIS — Z7984 Long term (current) use of oral hypoglycemic drugs: Secondary | ICD-10-CM | POA: Insufficient documentation

## 2021-01-10 HISTORY — PX: COLONOSCOPY WITH PROPOFOL: SHX5780

## 2021-01-10 HISTORY — PX: POLYPECTOMY: SHX5525

## 2021-01-10 LAB — GLUCOSE, CAPILLARY: Glucose-Capillary: 165 mg/dL — ABNORMAL HIGH (ref 70–99)

## 2021-01-10 SURGERY — COLONOSCOPY WITH PROPOFOL
Anesthesia: General

## 2021-01-10 MED ORDER — PROPOFOL 10 MG/ML IV BOLUS
INTRAVENOUS | Status: DC | PRN
  Administered 2021-01-10: 100 mg via INTRAVENOUS

## 2021-01-10 MED ORDER — PROPOFOL 500 MG/50ML IV EMUL
INTRAVENOUS | Status: DC | PRN
  Administered 2021-01-10: 150 ug/kg/min via INTRAVENOUS

## 2021-01-10 MED ORDER — LACTATED RINGERS IV SOLN: INTRAVENOUS | Status: DC

## 2021-01-10 NOTE — Transfer of Care (Signed)
Immediate Anesthesia Transfer of Care Note  Patient: Francis Hall  Procedure(s) Performed: COLONOSCOPY WITH PROPOFOL POLYPECTOMY  Patient Location: Endoscopy Unit  Anesthesia Type:General  Level of Consciousness: awake  Airway & Oxygen Therapy: Patient Spontanous Breathing  Post-op Assessment: Report given to RN and Post -op Vital signs reviewed and stable  Post vital signs: Reviewed and stable  Last Vitals:  Vitals Value Taken Time  BP    Temp    Pulse    Resp    SpO2      Last Pain:  Vitals:   01/10/21 0825  TempSrc:   PainSc: 0-No pain      Patients Stated Pain Goal: 9 ([phone] [id number])  Complications: No notable events documented.

## 2021-01-10 NOTE — H&P (Signed)
Primary Care Physician:  Sharilyn Sites, MD Primary Gastroenterologist:  Dr. Abbey Chatters  Pre-Procedure History & Physical: HPI:  Francis Hall is a 65 y.o. male is here for a colonoscopy for surveillance due to personal history of adenomatous colon polyps.  3 year recall, Last TCS 11/11/2017 done by Dr. Oneida Alar, tubular adenoma (x4)  Past Medical History:  Diagnosis Date   Atrial fibrillation ([place])    CAD (coronary artery disease)    [place]   CHF (congestive heart failure) ([place]) 11/08/2010   2D Echo EF=>55%   Diabetes mellitus    insulin   Diverticulosis    Dyspnea    Family hx of colon cancer    GERD (gastroesophageal reflux disease)    HTN (hypertension)    Hyperlipemia    Hyperplastic colon polyp 07/16/07   30cm    Myocardial infarction ([place]) 12/2012   OSA (obstructive sleep apnea) 12/21/2007   sleep study perform REM 89mns REM and NREM was 95.0%, AHI of 12.31hr and RDI of 12.4hr    Past Surgical History:  Procedure Laterality Date   CARDIAC CATHETERIZATION  2006 2011   2 stents   CATARACT EXTRACTION W/PHACO Left 12/03/2013   Procedure: CATARACT EXTRACTION PHACO [place]TRAOCULAR [place]AC[place]  Surgeon: KTonny Branch MD;  Location: [facility];  Service: Ophthalmology;  Laterality: Left;  CDE:7.07   CATARACT EXTRACTION W/PHACO Right 12/14/2013   Procedure: CATARACT EXTRACTION PHACO AND INTRAOCULAR LENS PLACEMENT ([facility]);  Surgeon: KTonny Branch MD;  Location: [facility];  Service: Ophthalmology;  Laterality: Right;  CDE 7.84    COLONOSCOPY  07/2007   Dr JArnoldo Morale hyperplastic polyp 30cm   COLONOSCOPY N/A 08/12/2012   Procedure: COLONOSCOPY;  Surgeon: SDanie Binder MD;  Location: [facility];  Service: Endoscopy;  Laterality: N/A;  8:30   COLONOSCOPY N/A 11/11/2017   Dr. FOneida Alar multiple tubular adenomas, next surveillance in 2022.    CORONARY ARTERY BYPASS GRAFT  06/2004   x2 performed for 40% left main stenosis, 80% proximal LAD stenosis of the first diagonal artery, and 50% left  circumfles artery priximal stenosis.LIMA to LAD and free radial artery to circumflex coronary artery   LEFT HEART CATHETERIZATION WITH CORONARY/GRAFT ANGIOGRAM  12/29/2012   Procedure: LEFT HEART CATHETERIZATION WITH CBeatrix Fetters  Surgeon: DLeonie Man MD;  [facility];  Service: Cardiovascular;;   POLYPECTOMY  11/11/2017   Procedure: POLYPECTOMY;  Surgeon: FDanie Binder MD;  Location: [facility];  Service: Endoscopy;;  ascending colon, transverse, splenic flexure, sigmoid   TONSILLECTOMY  1962    Prior to Admission medications   Medication Sig Start Date End Date Taking? Authorizing Provider  aspirin 81 MG EC tablet Take 81 mg by mouth daily.   Yes [provider]  atorvastatin (LIPITOR) 20 MG tablet TAKE 1 TABLET BY MOUTH EVERY DAY AT BEDTIME Patient taking differently: Take 20 mg by mouth at bedtime. 08/26/20  Yes Croitoru, Mihai, MD  carvedilol (COREG) 12.5 MG tablet TAKE 1 TABLET(12.5 MG) BY MOUTH TWICE DAILY WITH A MEAL Patient taking differently: Take 12.5 mg by mouth 2 (two) times daily with a meal. 10/20/20  Yes Croitoru, Mihai, MD  Cholecalciferol (VITAMIN D3) 25 MCG (1000 UT) CAPS Take 2 capsules (2,000 Units total) by mouth daily. 07/17/19  Yes Croitoru, Mihai, MD  diltiazem (CARDIZEM CD) 120 MG 24 hr capsule Take 1 capsule (120 mg total) by mouth daily. 09/01/18  Yes Croitoru, Mihai, MD  fenofibrate (TRICOR) 48 MG tablet TAKE 1 TABLET(48 MG) BY  MOUTH DAILY Patient taking differently: Take 48 mg by mouth daily. 12/23/20  Yes Croitoru, Mihai, MD  hydrochlorothiazide (HYDRODIURIL) 25 MG tablet TAKE 1 TABLET(25 MG) BY MOUTH DAILY Patient taking differently: Take 25 mg by mouth daily. 10/20/20  Yes Croitoru, Mihai, MD  insulin glargine (LANTUS SOLOSTAR) 100 UNIT/ML Solostar Pen ADMINISTER 70 UNITS UNDER THE SKIN AT BEDTIME Patient taking differently: Inject 70 Units into the skin at bedtime. 12/15/20  Yes Reardon, Juanetta Beets, NP  insulin lispro (HUMALOG  KWIKPEN) 100 UNIT/ML KwikPen ADMINISTER 10 TO 16 UNITS UNDER THE SKIN THREE TIMES DAILY BEFORE MEALS Patient taking differently: Inject 10-16 Units into the skin 3 (three) times daily before meals. 04/18/20  Yes Reardon, Juanetta Beets, NP  lisinopril (ZESTRIL) 20 MG tablet TAKE 1 TABLET BY MOUTH TWICE DAILY Patient taking differently: Take 20 mg by mouth in the morning and at bedtime. 11/17/20  Yes Leonie Man, MD  metFORMIN (GLUCOPHAGE) 1000 MG tablet TAKE 1 TABLET(1000 MG) BY MOUTH TWICE DAILY WITH A MEAL Patient taking differently: Take 1,000 mg by mouth 2 (two) times daily with a meal. 12/15/20  Yes Brita Romp, NP  Multiple Vitamin (MULTIVITAMIN) capsule Take 1 capsule by mouth daily.   Yes [provider]  nitroGLYCERIN (NITROSTAT) 0.4 MG SL tablet PLACE 1 TABLET UNDER TONGUE IF NEEDED FOR CHEST PAIN.[name] OF 3 DOSES Patient taking differently: Place 0.4 mg under the tongue every 5 (five) minutes as needed for chest pain. MAX OF 3 DOSES 08/02/20  Yes Croitoru, Mihai, MD  pantoprazole (PROTONIX) 40 MG tablet Take 40 mg by mouth daily.   Yes [provider]  vitamin C (ASCORBIC ACID) 500 MG tablet Take 500 mg by mouth daily.   Yes [provider]  acetaminophen (TYLENOL) 325 MG tablet Take 650 mg by mouth as needed for moderate pain or headache.    [provider]  blood glucose meter kit and supplies KIT Dispense based on patient and insurance preference. Use up to four times daily as directed. (FOR ICD-10 E11.65) 05/29/18   Cassandria Anger, MD  blood glucose meter kit and supplies 1 each by Other route 4 (four) times daily. Dispense based on patient and insurance preference. Use up to four times daily as directed. (FOR ICD-10 E10.9, E11.9). 09/28/19   Cassandria Anger, MD  Continuous Blood Gluc Sensor (FREESTYLE LIBRE 14 DAY SENSOR) MISC Inject 1 each into the skin every 14 (fourteen) days. Use as directed to check blood glucose four times daily  01/20/20   Cassandria Anger, MD  [facility] VERIO test strip USE UP TO FOUR TIMES DAILY AS DIRECTED 04/11/20   Cassandria Anger, MD    Allergies as of 12/30/2020 - Review Complete 12/15/2020  Allergen Reaction Noted   Crestor [rosuvastatin] Other (See Comments) 09/22/2012    Family History  Problem Relation Age of Onset   Colon cancer Father        5's?   Lung cancer Father    Coronary artery disease Father 85       MI   Arthritis Other    Heart disease Other    Diabetes Other     Social History   Socioeconomic History   Marital status: Married    Spouse name: Not on file   Number of children: 2   Years of education: 12   Highest education level: Not on file  Occupational History   Occupation: disabled Dealer   Tobacco Use   Smoking status: Never  Smokeless tobacco: Never  Vaping Use   Vaping Use: Never used  Substance and Sexual Activity   Alcohol use: Yes    Comment: social-2 beers/wk   Drug use: No   Sexual activity: Yes    Birth control/protection: None  Other Topics Concern   Not on file  Social History Narrative   Not on file   Social Determinants of Health   Financial Resource Strain: Not on file  Food Insecurity: Not on file  Transportation Needs: Not on file  Physical Activity: Not on file  Stress: Not on file  Social Connections: Not on file  Intimate Partner Violence: Not on file    Review of Systems: See HPI, otherwise negative ROS  Physical Exam: Vital signs in last 24 hours: Temp:  [98.4 F (36.9 C)] 98.4 F (36.9 C) (08/09 0722) Pulse Rate:  [87] 87 (08/09 0722) Resp:  [14] 14 (08/09 0722) SpO2:  [99 %] 99 % (08/09 0722) Weight:  [81.6 kg] 81.6 kg (08/09 0722)   General:   Alert,  Well-developed, well-nourished, pleasant and cooperative in NAD Head:  Normocephalic and atraumatic. Eyes:  Sclera clear, no icterus.   Conjunctiva pink. Ears:  Normal auditory acuity. Nose:  No deformity, discharge,  or lesions. Mouth:  No  deformity or lesions, dentition normal. Neck:  Supple; no masses or thyromegaly. Lungs:  Clear throughout to auscultation.   No wheezes, crackles, or rhonchi. No acute distress. Heart:  Regular rate and rhythm; no murmurs, clicks, rubs,  or gallops. Abdomen:  Soft, nontender and nondistended. No masses, hepatosplenomegaly or hernias noted. Normal bowel sounds, without guarding, and without rebound.   Msk:  Symmetrical without gross deformities. Normal posture. Extremities:  Without clubbing or edema. Neurologic:  Alert and  oriented x4;  grossly normal neurologically. Skin:  Intact without significant lesions or rashes. Cervical Nodes:  No significant cervical adenopathy. Psych:  Alert and cooperative. Normal mood and affect.  Impression/Plan: Francis Hall is here for a colonoscopy for surveillance due to personal history of adenomatous colon polyps.  3 year recall, Last TCS 11/11/2017 done by Dr. Oneida Alar, tubular adenoma (x4)  The risks of the procedure including infection, bleed, or perforation as well as benefits, limitations, alternatives and imponderables have been reviewed with the patient. Questions have been answered. All parties agreeable.

## 2021-01-10 NOTE — Anesthesia Preprocedure Evaluation (Signed)
Anesthesia Evaluation  Patient identified by MRN, date of birth, ID band Patient awake    Reviewed: Allergy & Precautions, NPO status , Patient's Chart, lab work & pertinent test results  Airway Mallampati: II  TM Distance: >3 FB Neck ROM: Full    Dental no notable dental hx.    Pulmonary shortness of breath, sleep apnea ,    Pulmonary exam normal breath sounds clear to auscultation       Cardiovascular hypertension, + CAD, + Past MI and +CHF  Normal cardiovascular exam Rhythm:Regular Rate:Normal     Neuro/Psych negative neurological ROS  negative psych ROS   GI/Hepatic Neg liver ROS, GERD  ,  Endo/Other  negative endocrine ROSdiabetes  Renal/GU negative Renal ROS  negative genitourinary   Musculoskeletal negative musculoskeletal ROS (+)   Abdominal   Peds negative pediatric ROS (+)  Hematology negative hematology ROS (+)   Anesthesia Other Findings   Reproductive/Obstetrics negative OB ROS                             Anesthesia Physical Anesthesia Plan  ASA: 3  Anesthesia Plan: General   Post-op Pain Management:    Induction:   PONV Risk Score and Plan:   Airway Management Planned:   Additional Equipment:   Intra-op Plan:   Post-operative Plan:   Informed Consent: I have reviewed the patients History and Physical, chart, labs and discussed the procedure including the risks, benefits and alternatives for the proposed anesthesia with the patient or authorized representative who has indicated his/her understanding and acceptance.       Plan Discussed with: CRNA  Anesthesia Plan Comments:         Anesthesia Quick Evaluation

## 2021-01-10 NOTE — Discharge Instructions (Addendum)
  Colonoscopy Discharge Instructions  Read the instructions outlined below and refer to this sheet in the next few weeks. These discharge instructions provide you with general information on caring for yourself after you leave the hospital. Your doctor may also give you specific instructions. While your treatment has been planned according to the most current medical practices available, unavoidable complications occasionally occur.   ACTIVITY You may resume your regular activity, but move at a slower pace for the next 24 hours.  Take frequent rest periods for the next 24 hours.  Walking will help get rid of the air and reduce the bloated feeling in your belly (abdomen).  No driving for 24 hours (because of the medicine (anesthesia) used during the test).   Do not sign any important legal documents or operate any machinery for 24 hours (because of the anesthesia used during the test).  NUTRITION Drink plenty of fluids.  You may resume your normal diet as instructed by your doctor.  Begin with a light meal and progress to your normal diet. Heavy or fried foods are harder to digest and may make you feel sick to your stomach (nauseated).  Avoid alcoholic beverages for 24 hours or as instructed.  MEDICATIONS You may resume your normal medications unless your doctor tells you otherwise.  WHAT YOU CAN EXPECT TODAY Some feelings of bloating in the abdomen.  Passage of more gas than usual.  Spotting of blood in your stool or on the toilet paper.  IF YOU HAD POLYPS REMOVED DURING THE COLONOSCOPY: No aspirin products for 7 days or as instructed.  No alcohol for 7 days or as instructed.  Eat a soft diet for the next 24 hours.  FINDING OUT THE RESULTS OF YOUR TEST Not all test results are available during your visit. If your test results are not back during the visit, make an appointment with your caregiver to find out the results. Do not assume everything is normal if you have not heard from your  caregiver or the medical facility. It is important for you to follow up on all of your test results.  SEEK IMMEDIATE MEDICAL ATTENTION IF: You have more than a spotting of blood in your stool.  Your belly is swollen (abdominal distention).  You are nauseated or vomiting.  You have a temperature over 101.  You have abdominal pain or discomfort that is severe or gets worse throughout the day.   Your colonoscopy revealed 3 polyp(s) which I removed successfully. Await pathology results, my office will contact you. I recommend repeating colonoscopy in 5 years for surveillance purposes.   You also have diverticulosis and internal hemorrhoids. I would recommend increasing fiber in your diet or adding OTC Benefiber/Metamucil. Be sure to drink at least 4 to 6 glasses of water daily. Follow-up with GI as needed.   I hope you have a great rest of your week!  Charles K. Carver, D.O. Gastroenterology and Hepatology [facility]  

## 2021-01-10 NOTE — Op Note (Signed)
[facility] Patient Name: Francis Hall Procedure Date: 01/10/2021 8:18 AM MRN: [id number] Date of Birth: 09-20-1955 Attending MD: Elon Alas. Abbey Chatters DO CSN: [id number] Age: 65 Admit Type: Outpatient Procedure:                Colonoscopy Indications:              Surveillance: Personal history of adenomatous                            polyps on last colonoscopy 5 years ago Providers:                Elon Alas. Abbey Chatters, DO, Gwenlyn Fudge, RN, Randa Spike, Technician Referring MD:              Medicines:                See the Anesthesia note for documentation of the                            administered medications Complications:            No immediate complications. Estimated Blood Loss:     Estimated blood loss was minimal. Procedure:                Pre-Anesthesia Assessment:                           - The anesthesia plan was to use monitored                            anesthesia care (MAC).                           After obtaining informed consent, the colonoscope                            was passed under direct vision. Throughout the                            procedure, the patient's blood pressure, pulse, and                            oxygen saturations were monitored continuously. The                            [id number]-HQ190L ([id number]) scope was introduced through                            the anus and advanced to the the cecum, identified                            by appendiceal orifice and ileocecal valve. The                            colonoscopy was performed without difficulty.  The                            patient tolerated the procedure well. The quality                            of the bowel preparation was evaluated using the                            BBPS [facility] Bowel Preparation Scale) with scores                            of: Right Colon = 3, Transverse Colon = 3 and Left                            Colon = 3 (entire mucosa  seen well with no residual                            staining, small fragments of stool or opaque                            liquid). The total BBPS score equals 9. Scope In: 8:28:39 AM Scope Out: 8:39:47 AM Scope Withdrawal Time: 0 hours 8 minutes 49 seconds  Total Procedure Duration: 0 hours 11 minutes 8 seconds  Findings:      The perianal and digital rectal examinations were normal.      Non-bleeding internal hemorrhoids were found during endoscopy.      Multiple small and large-mouthed diverticula were found in the sigmoid       colon and descending colon.      Two sessile polyps were found in the cecum. The polyps were 1 to 2 mm in       size. These polyps were removed with a cold biopsy forceps. Resection       and retrieval were complete.      A 5 mm polyp was found in the transverse colon. The polyp was sessile.       The polyp was removed with a cold snare. Resection and retrieval were       complete.      The exam was otherwise without abnormality. Impression:               - Non-bleeding internal hemorrhoids.                           - Diverticulosis in the sigmoid colon and in the                            descending colon.                           - Two 1 to 2 mm polyps in the cecum, removed with a                            cold biopsy forceps. Resected and retrieved.                           -  One 5 mm polyp in the transverse colon, removed                            with a cold snare. Resected and retrieved.                           - The examination was otherwise normal. Moderate Sedation:      Per Anesthesia Care Recommendation:           - Patient has a contact number available for                            emergencies. The signs and symptoms of potential                            delayed complications were discussed with the                            patient. Return to normal activities tomorrow.                            Written discharge instructions  were provided to the                            patient.                           - Resume previous diet.                           - Continue present medications.                           - Await pathology results.                           - Repeat colonoscopy in 5 years for surveillance.                           - Return to GI clinic PRN. Procedure Code(s):        --- Professional ---                           [phone], Colonoscopy, flexible; with removal of                            tumor(s), polyp(s), or other lesion(s) by snare                            technique                           [id number], 59, Colonoscopy, flexible; with biopsy,                            single or multiple Diagnosis Code(s):        ---  Professional ---                           K63.5, Polyp of colon                           Z86.010, Personal history of colonic polyps                           K64.8, Other hemorrhoids                           K57.30, Diverticulosis of large intestine without                            perforation or abscess without bleeding CPT copyright 2019 American Medical Association. All rights reserved. The codes documented in this report are preliminary and upon coder review may  be revised to meet current compliance requirements. Elon Alas. Abbey Chatters, DO [place] Abbey Chatters, DO 01/10/2021 8:42:18 AM This report has been signed electronically. Number of Addenda: 0

## 2021-01-10 NOTE — Anesthesia Postprocedure Evaluation (Signed)
Anesthesia Post Note  Patient: Francis Hall  Procedure(s) Performed: COLONOSCOPY WITH PROPOFOL POLYPECTOMY  Patient location during evaluation: PACU Anesthesia Type: General Level of consciousness: awake and alert Pain management: pain level controlled Vital Signs Assessment: post-procedure vital signs reviewed and stable Respiratory status: spontaneous breathing, nonlabored ventilation, respiratory function stable and patient connected to nasal cannula oxygen Cardiovascular status: blood pressure returned to baseline and stable Postop Assessment: no apparent nausea or vomiting Anesthetic complications: no   No notable events documented.   Last Vitals:  Vitals:   01/10/21 0722 01/10/21 0842  BP:  (!) 119/56  Pulse: 87   Resp: 14 20  Temp: 36.9 C 36.9 C  SpO2: 99% 96%    Last Pain:  Vitals:   01/10/21 0842  TempSrc: Oral  PainSc: 0-No pain                 Nicanor Alcon

## 2021-01-11 LAB — SURGICAL PATHOLOGY

## 2021-01-13 ENCOUNTER — Encounter: Payer: Self-pay | Admitting: *Deleted

## 2021-01-17 ENCOUNTER — Encounter (HOSPITAL_COMMUNITY): Payer: Self-pay | Admitting: Internal Medicine

## 2021-01-25 ENCOUNTER — Other Ambulatory Visit: Payer: Self-pay

## 2021-01-25 MED ORDER — CARVEDILOL 12.5 MG PO TABS: ORAL_TABLET | ORAL | 3 refills | Status: DC

## 2021-02-23 ENCOUNTER — Other Ambulatory Visit: Payer: Self-pay | Admitting: Cardiology

## 2021-03-01 ENCOUNTER — Telehealth: Payer: Self-pay | Admitting: Cardiovascular Disease

## 2021-03-01 ENCOUNTER — Ambulatory Visit: Payer: Medicare Other | Admitting: Cardiovascular Disease

## 2021-03-01 MED ORDER — DILTIAZEM HCL ER COATED BEADS 120 MG PO CP24: 120.0000 mg | ORAL_CAPSULE | Freq: Every day | ORAL | 1 refills | Status: DC

## 2021-03-01 MED ORDER — HYDROCHLOROTHIAZIDE 25 MG PO TABS: 25.0000 mg | ORAL_TABLET | Freq: Every day | ORAL | 1 refills | Status: DC

## 2021-03-01 NOTE — Telephone Encounter (Signed)
*  STAT* If patient is at the pharmacy, call can be transferred to refill team.   1. Which medications need to be refilled? (please list name of each medication and dose if known)  Diltiazem and Hydrochlorothiazide  2. Which pharmacy/location (including street and city if local pharmacy) is medication to be sent to? [place], [place]  3. Do they need a 30 day or 90 day supply? 90 days and refills

## 2021-03-01 NOTE — Telephone Encounter (Signed)
Refills has been sent to the pharmacy. 

## 2021-03-07 DIAGNOSIS — Z0001 Encounter for general adult medical examination with abnormal findings: Secondary | ICD-10-CM | POA: Diagnosis not present

## 2021-03-07 DIAGNOSIS — E109 Type 1 diabetes mellitus without complications: Secondary | ICD-10-CM | POA: Diagnosis not present

## 2021-03-07 DIAGNOSIS — Z1331 Encounter for screening for depression: Secondary | ICD-10-CM | POA: Diagnosis not present

## 2021-03-07 DIAGNOSIS — K219 Gastro-esophageal reflux disease without esophagitis: Secondary | ICD-10-CM | POA: Diagnosis not present

## 2021-03-07 DIAGNOSIS — E1140 Type 2 diabetes mellitus with diabetic neuropathy, unspecified: Secondary | ICD-10-CM | POA: Diagnosis not present

## 2021-03-13 ENCOUNTER — Other Ambulatory Visit: Payer: Self-pay

## 2021-03-24 ENCOUNTER — Other Ambulatory Visit: Payer: Self-pay | Admitting: Cardiovascular Disease

## 2021-04-11 ENCOUNTER — Other Ambulatory Visit: Payer: Self-pay

## 2021-04-11 ENCOUNTER — Ambulatory Visit (INDEPENDENT_AMBULATORY_CARE_PROVIDER_SITE_OTHER): Payer: Medicare Other | Admitting: Urology

## 2021-04-11 ENCOUNTER — Encounter: Payer: Self-pay | Admitting: Urology

## 2021-04-11 VITALS — BP 165/74 | HR 80 | Temp 98.9°F

## 2021-04-11 DIAGNOSIS — R972 Elevated prostate specific antigen [PSA]: Secondary | ICD-10-CM | POA: Diagnosis not present

## 2021-04-11 NOTE — Progress Notes (Signed)
Urological Symptom Review  Patient is experiencing the following symptoms: Get up at night to urinate Stream starts and stops Trouble starting stream   Review of Systems  Gastrointestinal (upper)  : Negative for upper GI symptoms  Gastrointestinal (lower) : Negative for lower GI symptoms  Constitutional : Negative for symptoms  Skin: Negative for skin symptoms  Eyes: Negative for eye symptoms  Ear/Nose/Throat : Negative for Ear/Nose/Throat symptoms  Hematologic/Lymphatic: Negative for Hematologic/Lymphatic symptoms  Cardiovascular : Negative for cardiovascular symptoms  Respiratory : Negative for respiratory symptoms  Endocrine: Negative for endocrine symptoms  Musculoskeletal: Negative for musculoskeletal symptoms  Neurological: Negative for neurological symptoms  Psychologic: Negative for psychiatric symptoms

## 2021-04-11 NOTE — Progress Notes (Signed)
Assessment: 1. Elevated PSA     Plan: Today I had a long discussion with the patient regarding PSA and the rationale and controversies of prostate cancer early detection.  I discussed the pros and cons of further evaluation including TRUS and prostate Bx.  Potential adverse events and complications as well as standard instructions were given.  Patient expressed his understanding of these issues. Repeat PSA with labs tomorrow Will call with results  Chief Complaint:  Chief Complaint  Patient presents with   Elevated PSA     History of Present Illness:  Francis Hall is a 65 y.o. year old male who is seen in consultation from Sharilyn Sites, MD for evaluation of elevated PSA. PSA results: 7/11 1.22 7/13 1.07 2/15 1.75 1/17 2.0 9/19 2.6 6/21 3.3 10/22 4.3  No family history of prostate cancer.  No history of UTIs or prostatitis.  No prior prostate biopsy.  He does not have any significant lower urinary tract symptoms other than nocturia x3 and daytime frequency.  No dysuria or gross hematuria. AUA score = 8 today.   Past Medical History:  Past Medical History:  Diagnosis Date   Atrial fibrillation ([place])    CAD (coronary artery disease)    [facility]   CHF (congestive heart failure) ([place]) 11/08/2010   2D Echo EF=>55%   Diabetes mellitus    insulin   Diverticulosis    Dyspnea    Family hx of colon cancer    GERD (gastroesophageal reflux disease)    HTN (hypertension)    Hyperlipemia    Hyperplastic colon polyp 07/16/07   30cm    Myocardial infarction ([place]) 12/2012   OSA (obstructive sleep apnea) 12/21/2007   sleep study perform REM 29mins REM and NREM was 95.0%, AHI of 12.31hr and RDI of 12.4hr    Past Surgical History:  Past Surgical History:  Procedure Laterality Date   CARDIAC CATHETERIZATION  2006 2011   2 stents   CATARACT EXTRACTION W/PHACO Left 12/03/2013   Procedure: CATARACT EXTRACTION PHACO AND INTRAOCULAR LENS PLACEMENT ([place]);  Surgeon: Tonny Branch,  MD;  Location: [facility];  Service: Ophthalmology;  Laterality: Left;  CDE:7.07   CATARACT EXTRACTION W/PHACO Right 12/14/2013   Procedure: CATARACT EXTRACTION PHACO AND INTRAOCULAR LENS PLACEMENT (IOC);  Surgeon: Tonny Branch, MD;  Location: [facility];  Service: Ophthalmology;  Laterality: Right;  CDE 7.84    COLONOSCOPY  07/2007   Dr Arnoldo Morale: hyperplastic polyp 30cm   COLONOSCOPY N/A 08/12/2012   Procedure: COLONOSCOPY;  Surgeon: Danie Binder, MD;  Location[facility];  Service: Endoscopy;  Laterality: N/A;  8:30   COLONOSCOPY N/A 11/11/2017   Dr. Oneida Alar: multiple tubular adenomas, next surveillance in 2022.    COLONOSCOPY WITH PROPOFOL N/A 01/10/2021   Procedure: COLONOSCOPY WITH PROPOFOL;  Surgeon: Eloise Harman, DO;  [facility];  Service: Endoscopy;  Laterality: N/A;  8:45 / ASA II   CORONARY ARTERY BYPASS GRAFT  06/2004   x2 performed for 40% left main stenosis, 80% proximal LAD stenosis of the first diagonal artery, and 50% left circumfles artery priximal stenosis.LIMA to LAD and free radial artery to circumflex coronary artery   LEFT HEART CATHETERIZATION WITH CORONARY/GRAFT ANGIOGRAM  12/29/2012   Procedure: LEFT HEART CATHETERIZATION WITH Beatrix Fetters;  Surgeon: Leonie Man, MD;  Location: [facility] CATH LAB;  Service: Cardiovascular;;   POLYPECTOMY  11/11/2017   Procedure: POLYPECTOMY;  Surgeon: Danie Binder, MD;  [facility];  Service: Endoscopy;;  ascending colon, transverse, splenic flexure, sigmoid   POLYPECTOMY  01/10/2021   Procedure: POLYPECTOMY;  Surgeon: Eloise Harman, DO;  [facility];  Service: Endoscopy;;   TONSILLECTOMY  1962    Allergies:  Allergies  Allergen Reactions   Crestor [Rosuvastatin] Other (See Comments)    myalgia    Family History:  Family History  Problem Relation Age of Onset   Colon cancer Father        76's?   Lung cancer Father    Coronary artery disease Father 26       MI   Arthritis Other     Heart disease Other    Diabetes Other     Social History:  Social History   Tobacco Use   Smoking status: Never   Smokeless tobacco: Never  Vaping Use   Vaping Use: Never used  Substance Use Topics   Alcohol use: Yes    Comment: social-2 beers/wk   Drug use: No    Review of symptoms:  Constitutional:  Negative for unexplained weight loss, night sweats, fever, chills ENT:  Negative for nose bleeds, sinus pain, painful swallowing CV:  Negative for chest pain, shortness of breath, exercise intolerance, palpitations, loss of consciousness Resp:  Negative for cough, wheezing, shortness of breath GI:  Negative for nausea, vomiting, diarrhea, bloody stools GU:  Positives noted in HPI; otherwise negative for gross hematuria, dysuria, urinary incontinence Neuro:  Negative for seizures, poor balance, limb weakness, slurred speech Psych:  Negative for lack of energy, depression, anxiety Endocrine:  Negative for polydipsia, polyuria, symptoms of hypoglycemia (dizziness, hunger, sweating) Hematologic:  Negative for anemia, purpura, petechia, prolonged or excessive bleeding, use of anticoagulants  Allergic:  Negative for difficulty breathing or choking as a result of exposure to anything; no shellfish allergy; no allergic response (rash/itch) to materials, foods  Physical exam: BP (!) 165/74   Pulse 80   Temp 98.9 F (37.2 C)  GENERAL APPEARANCE:  Well appearing, well developed, well nourished, NAD HEENT: Atraumatic, Normocephalic, oropharynx clear. NECK: Supple without lymphadenopathy or thyromegaly. LUNGS: Clear to auscultation bilaterally. HEART: Regular Rate and Rhythm without murmurs, gallops, or rubs. ABDOMEN: Soft, non-tender, No Masses. EXTREMITIES: Moves all extremities well.  Without clubbing, cyanosis, or edema. NEUROLOGIC:  Alert and oriented x 3, normal gait, CN II-XII grossly intact.  MENTAL STATUS:  Appropriate. BACK:  Non-tender to palpation.  No CVAT SKIN:  Warm, dry  and intact.   GU: Penis:  uncircumcised Meatus: Normal Scrotum: normal, bilateral scrotal enlargement consistent with hydroceles Testis: normal without masses bilateral Prostate: 40 g, NT, no nodules Rectum: Normal tone,  no masses or tenderness   Results: U/A:  2+ protein

## 2021-04-12 DIAGNOSIS — R972 Elevated prostate specific antigen [PSA]: Secondary | ICD-10-CM | POA: Diagnosis not present

## 2021-04-12 DIAGNOSIS — E1159 Type 2 diabetes mellitus with other circulatory complications: Secondary | ICD-10-CM | POA: Diagnosis not present

## 2021-04-13 LAB — COMPREHENSIVE METABOLIC PANEL
ALT: 18 IU/L (ref 0–44)
AST: 16 IU/L (ref 0–40)
Albumin/Globulin Ratio: 1.8 (ref 1.2–2.2)
Albumin: 4.4 g/dL (ref 3.8–4.8)
Alkaline Phosphatase: 88 IU/L (ref 44–121)
BUN/Creatinine Ratio: 17 (ref 10–24)
BUN: 19 mg/dL (ref 8–27)
Bilirubin Total: 0.3 mg/dL (ref 0.0–1.2)
CO2: 24 mmol/L (ref 20–29)
Calcium: 9.4 mg/dL (ref 8.6–10.2)
Chloride: 99 mmol/L (ref 96–106)
Creatinine, Ser: 1.09 mg/dL (ref 0.76–1.27)
Globulin, Total: 2.5 g/dL (ref 1.5–4.5)
Glucose: 147 mg/dL — ABNORMAL HIGH (ref 70–99)
Potassium: 4.7 mmol/L (ref 3.5–5.2)
Sodium: 136 mmol/L (ref 134–144)
Total Protein: 6.9 g/dL (ref 6.0–8.5)
eGFR: 76 mL/min/{1.73_m2} (ref >59)

## 2021-04-13 LAB — PSA: Prostate Specific Ag, Serum: 3.4 ng/mL (ref 0.0–4.0)

## 2021-04-18 ENCOUNTER — Ambulatory Visit (INDEPENDENT_AMBULATORY_CARE_PROVIDER_SITE_OTHER): Payer: Medicare Other | Admitting: Nurse Practitioner

## 2021-04-18 ENCOUNTER — Encounter: Payer: Self-pay | Admitting: Nurse Practitioner

## 2021-04-18 ENCOUNTER — Other Ambulatory Visit: Payer: Self-pay

## 2021-04-18 VITALS — BP 176/70 | HR 71 | Ht 67.0 in | Wt 191.0 lb

## 2021-04-18 DIAGNOSIS — I10 Essential (primary) hypertension: Secondary | ICD-10-CM

## 2021-04-18 DIAGNOSIS — E782 Mixed hyperlipidemia: Secondary | ICD-10-CM | POA: Diagnosis not present

## 2021-04-18 DIAGNOSIS — E1159 Type 2 diabetes mellitus with other circulatory complications: Secondary | ICD-10-CM

## 2021-04-18 LAB — POCT UA - MICROALBUMIN: Microalbumin Ur, POC: 150 mg/L

## 2021-04-18 LAB — POCT GLYCOSYLATED HEMOGLOBIN (HGB A1C): Hemoglobin A1C: 6.9 % — AB (ref 4.0–5.6)

## 2021-04-18 MED ORDER — INSULIN LISPRO (1 UNIT DIAL) 100 UNIT/ML (KWIKPEN): 10.0000 [IU] | PEN_INJECTOR | Freq: Three times a day (TID) | SUBCUTANEOUS | 3 refills | Status: DC

## 2021-04-18 MED ORDER — LANTUS SOLOSTAR 100 UNIT/ML ~~LOC~~ SOPN: 70.0000 [IU] | PEN_INJECTOR | Freq: Every day | SUBCUTANEOUS | 3 refills | Status: DC

## 2021-04-18 MED ORDER — METFORMIN HCL 1000 MG PO TABS: 1000.0000 mg | ORAL_TABLET | Freq: Two times a day (BID) | ORAL | 3 refills | Status: DC

## 2021-04-18 NOTE — Patient Instructions (Signed)

## 2021-04-18 NOTE — Progress Notes (Signed)
04/18/2021   Endocrinology follow-up note   Subjective:    Patient ID: Francis Hall, male    DOB: August 16, 1955,    Past Medical History:  Diagnosis Date   Atrial fibrillation ([place])    CAD (coronary artery disease)    [facility]   CHF (congestive heart failure) ([place]) 11/08/2010   2D Echo EF=>55%   Diabetes mellitus    insulin   Diverticulosis    Dyspnea    Family hx of colon cancer    GERD (gastroesophageal reflux disease)    HTN (hypertension)    Hyperlipemia    Hyperplastic colon polyp 07/16/07   30cm    Myocardial infarction ([place]) 12/2012   OSA (obstructive sleep apnea) 12/21/2007   sleep study perform REM 54mns REM and NREM was 95.0%, AHI of 12.31hr and RDI of 12.4hr   Past Surgical History:  Procedure Laterality Date   CARDIAC CATHETERIZATION  2006 2011   2 stents   CATARACT EXTRACTION W/PHACO Left 12/03/2013   Procedure: CATARACT EXTRACTION PHACO AND INTRAOCULAR LENS PLACEMENT ([place];  Surgeon: KTonny Branch MD;  Location[facility];  Service: Ophthalmology;  Laterality: Left;  CDE:7.07   CATARACT EXTRACTION W/PHACO Right 12/14/2013   Procedure: CATARACT EXTRACTION PHACO AND INTRAOCULAR LENS PLACEMENT (IOC);  Surgeon: KTonny Branch MD;  Location: [facility];  Service: Ophthalmology;  Laterality: Right;  CDE 7.84    COLONOSCOPY  07/2007   Dr JArnoldo Morale hyperplastic polyp 30cm   COLONOSCOPY N/A 08/12/2012   Procedure: COLONOSCOPY;  Surgeon: SDanie Binder MD;  [facility];  Service: Endoscopy;  Laterality: N/A;  8:30   COLONOSCOPY N/A 11/11/2017   Dr. FOneida Alar multiple tubular adenomas, next surveillance in 2022.    COLONOSCOPY WITH PROPOFOL N/A 01/10/2021   Procedure: COLONOSCOPY WITH PROPOFOL;  Surgeon: CEloise Harman DO;  [facility];  Service: Endoscopy;  Laterality: N/A;  8:45 / ASA II   CORONARY ARTERY BYPASS GRAFT  06/2004   x2 performed for 40% left main stenosis, 80% proximal LAD stenosis of the first diagonal artery, and 50% left  circumfles artery priximal stenosis.LIMA to LAD and free radial artery to circumflex coronary artery   LEFT HEART CATHETERIZATION WITH CORONARY/GRAFT ANGIOGRAM  12/29/2012   Procedure: LEFT HEART CATHETERIZATION WITH CBeatrix Fetters  Surgeon: DLeonie Man MD;  [facility];  Service: Cardiovascular;;   POLYPECTOMY  11/11/2017   Procedure: POLYPECTOMY;  Surgeon: FDanie Binder MD;  [facility];  Service: Endoscopy;;  ascending colon, transverse, splenic flexure, sigmoid   POLYPECTOMY  01/10/2021   Procedure: POLYPECTOMY;  Surgeon: CEloise Harman DO;  [facility];  Service: Endoscopy;;   TONSILLECTOMY  1962   Social History   Socioeconomic History   Marital status: Married    Spouse name: Not on file   Number of children: 2   Years of education: 12   Highest education level: Not on file  Occupational History   Occupation: disabled mDealer  Tobacco Use   Smoking status: Never   Smokeless tobacco: Never  Vaping Use   Vaping Use: Never used  Substance and Sexual Activity   Alcohol use: Yes    Comment: social-2 beers/wk   Drug use: No   Sexual activity: Yes    Birth control/protection: None  Other Topics Concern   Not on file  Social History Narrative   Not on file   Social Determinants of Health  Financial Resource Strain: Not on file  Food Insecurity: Not on file  Transportation Needs: Not on file  Physical Activity: Not on file  Stress: Not on file  Social Connections: Not on file   Outpatient Encounter Medications as of 04/18/2021  Medication Sig   acetaminophen (TYLENOL) 325 MG tablet Take 650 mg by mouth as needed for moderate pain or headache.   aspirin 81 MG EC tablet Take 81 mg by mouth daily.   atorvastatin (LIPITOR) 20 MG tablet TAKE 1 TABLET BY MOUTH EVERY DAY AT BEDTIME (Patient taking differently: Take 20 mg by mouth at bedtime.)   blood glucose meter kit and supplies KIT Dispense based on patient and  insurance preference. Use up to four times daily as directed. (FOR ICD-10 E11.65)   blood glucose meter kit and supplies 1 each by Other route 4 (four) times daily. Dispense based on patient and insurance preference. Use up to four times daily as directed. (FOR ICD-10 E10.9, E11.9).   carvedilol (COREG) 12.5 MG tablet TAKE 1 TABLET(12.5 MG) BY MOUTH TWICE DAILY WITH A MEAL   Cholecalciferol (VITAMIN D3) 25 MCG (1000 UT) CAPS Take 2 capsules (2,000 Units total) by mouth daily. (Patient taking differently: Take 1,000 Units by mouth daily.)   Continuous Blood Gluc Sensor (FREESTYLE LIBRE 14 DAY SENSOR) MISC Inject 1 each into the skin every 14 (fourteen) days. Use as directed to check blood glucose four times daily   diltiazem (CARDIZEM CD) 120 MG 24 hr capsule Take 1 capsule (120 mg total) by mouth daily.   fenofibrate (TRICOR) 48 MG tablet TAKE 1 TABLET(48 MG) BY MOUTH DAILY   hydrochlorothiazide (HYDRODIURIL) 25 MG tablet Take 1 tablet (25 mg total) by mouth daily. TAKE 1 TABLET(25 MG) BY MOUTH DAILY   insulin glargine (LANTUS SOLOSTAR) 100 UNIT/ML Solostar Pen Inject 70 Units into the skin at bedtime.   insulin lispro (HUMALOG KWIKPEN) 100 UNIT/ML KwikPen Inject 10-16 Units into the skin 3 (three) times daily before meals.   lisinopril (ZESTRIL) 20 MG tablet TAKE 1 TABLET BY MOUTH TWICE DAILY   metFORMIN (GLUCOPHAGE) 1000 MG tablet Take 1 tablet (1,000 mg total) by mouth 2 (two) times daily with a meal.   Multiple Vitamin (MULTIVITAMIN) capsule Take 1 capsule by mouth daily.   nitroGLYCERIN (NITROSTAT) 0.4 MG SL tablet PLACE 1 TABLET UNDER TONGUE IF NEEDED FOR CHEST PAIN.[name] OF 3 DOSES (Patient taking differently: Place 0.4 mg under the tongue every 5 (five) minutes as needed for chest pain. MAX OF 3 DOSES)   ONETOUCH VERIO test strip USE UP TO FOUR TIMES DAILY AS DIRECTED   pantoprazole (PROTONIX) 40 MG tablet Take 40 mg by mouth daily.   vitamin C (ASCORBIC ACID) 500 MG tablet Take 500 mg by  mouth daily.   [DISCONTINUED] insulin glargine (LANTUS SOLOSTAR) 100 UNIT/ML Solostar Pen ADMINISTER 70 UNITS UNDER THE SKIN AT BEDTIME (Patient taking differently: Inject 70 Units into the skin at bedtime.)   [DISCONTINUED] insulin lispro (HUMALOG KWIKPEN) 100 UNIT/ML KwikPen ADMINISTER 10 TO 16 UNITS UNDER THE SKIN THREE TIMES DAILY BEFORE MEALS (Patient taking differently: Inject 10-16 Units into the skin 3 (three) times daily before meals.)   [DISCONTINUED] metFORMIN (GLUCOPHAGE) 1000 MG tablet TAKE 1 TABLET(1000 MG) BY MOUTH TWICE DAILY WITH A MEAL (Patient taking differently: Take 1,000 mg by mouth 2 (two) times daily with a meal.)   No facility-administered encounter medications on file as of 04/18/2021.   ALLERGIES: Allergies  Allergen Reactions   Crestor [Rosuvastatin] Other (  See Comments)    myalgia   VACCINATION STATUS:  There is no immunization history on file for this patient.  Diabetes He presents for his follow-up diabetic visit. He has type 2 diabetes mellitus. Onset time: He was diagnosed at approximate age of 29 years. His disease course has been stable. There are no hypoglycemic associated symptoms. Pertinent negatives for hypoglycemia include no confusion, pallor or seizures. There are no diabetic associated symptoms. Pertinent negatives for diabetes include no fatigue, no polydipsia, no polyphagia, no polyuria and no weakness. There are no hypoglycemic complications. Symptoms are stable. Diabetic complications include heart disease, nephropathy and peripheral neuropathy. Risk factors for coronary artery disease include dyslipidemia, diabetes mellitus, hypertension, male sex, sedentary lifestyle and obesity. Current diabetic treatment includes intensive insulin program and oral agent (monotherapy). He is compliant with treatment most of the time. His weight is increasing steadily. He is following a generally healthy diet. When asked about meal planning, he reported none. He has  had a previous visit with a dietitian. He participates in exercise intermittently. His home blood glucose trend is fluctuating dramatically. (He presents today with his logs, no meter, showing widely fluctuating glycemic profile.  His POCT A1c today is 6.9% stable from last visit of 7%.  He reports he has not been eating on any routine schedule lately which is contributing to his fluctuations.  He denies any significant hypoglycemia.  He has a CGM device but does not always use it due to problems with the sensor falling off.  ) An ACE inhibitor/angiotensin II receptor blocker is being taken. He does not see a podiatrist.Eye exam is current.  Hyperlipidemia This is a chronic problem. The current episode started more than 1 year ago. The problem is uncontrolled. Recent lipid tests were reviewed and are variable. Exacerbating diseases include chronic renal disease, diabetes and obesity. Factors aggravating his hyperlipidemia include beta blockers and thiazides. Pertinent negatives include no myalgias. Current antihyperlipidemic treatment includes statins and fibric acid derivatives. The current treatment provides moderate improvement of lipids. There are no compliance problems.  Risk factors for coronary artery disease include diabetes mellitus, dyslipidemia, hypertension, male sex, a sedentary lifestyle, obesity and family history.    Review of systems  Constitutional: + steadily increasing body weight,  current Body mass index is 29.91 kg/m. , no fatigue, no subjective hyperthermia, no subjective hypothermia Eyes: no blurry vision, no xerophthalmia ENT: no sore throat, no nodules palpated in throat, no dysphagia/odynophagia, no hoarseness Cardiovascular: no chest pain, no shortness of breath, no palpitations, no leg swelling Respiratory: no cough, no shortness of breath Gastrointestinal: no nausea/vomiting/diarrhea Musculoskeletal: no muscle/joint aches Skin: no rashes, no hyperemia Neurological: no  tremors, no numbness, no tingling, no dizziness Psychiatric: no depression, no anxiety   Objective:    BP (!) 176/70   Pulse 71   Ht _0  (1.702 m)   Wt 191 lb (86.6 kg)   BMI 29.91 kg/m   Wt Readings from Last 3 Encounters:  04/18/21 191 lb (86.6 kg)  01/10/21 180 lb (81.6 kg)  12/15/20 180 lb 3.2 oz (81.7 kg)    BP Readings from Last 3 Encounters:  04/18/21 (!) 176/70  04/11/21 (!) 165/74  01/10/21 (!) 119/56     Physical Exam- Limited  Constitutional:  Body mass index is 29.91 kg/m. , not in acute distress, normal state of mind Eyes:  EOMI, no exophthalmos Neck: Supple Cardiovascular: RRR, no murmurs, rubs, or gallops, no edema Respiratory: Adequate breathing efforts, no crackles, rales, rhonchi, or  wheezing Musculoskeletal: no gross deformities, strength intact in all four extremities, no gross restriction of joint movements Skin:  no rashes, no hyperemia Neurological: no tremor with outstretched hands  Diabetic Foot Exam - Simple   Simple Foot Form Diabetic Foot exam was performed with the following findings: Yes 04/18/2021 11:46 AM  Visual Inspection See comments: Yes Sensation Testing Intact to touch and monofilament testing bilaterally: Yes Pulse Check Posterior Tibialis and Dorsalis pulse intact bilaterally: Yes Comments Has small callused area to left posterior heel, causes some discomfort when walking barefoot     Results for orders placed or performed in visit on 04/11/21  PSA  Result Value Ref Range   Prostate Specific Ag, Serum 3.4 0.0 - 4.0 ng/mL   Diabetic Labs (most recent): Lab Results  Component Value Date   HGBA1C 7.0 (A) 12/15/2020   HGBA1C 7.0 08/12/2020   HGBA1C 7.2 (A) 04/14/2020   Lipid Panel     Component Value Date/Time   CHOL 233 (H) 08/05/2020 0821   TRIG 346 (H) 08/05/2020 0821   HDL 34 (L) 08/05/2020 0821   CHOLHDL 6.9 (H) 08/05/2020 0821   CHOLHDL 7.0 12/29/2012 0438   VLDL UNABLE TO CALCULATE IF TRIGLYCERIDE OVER  400 mg/dL 12/29/2012 0438   LDLCALC 136 (H) 08/05/2020 0821    CMP Latest Ref Rng & Units 04/12/2021 08/05/2020 01/06/2020  Glucose 70 - 99 mg/dL 147(H) 162(H) 159(H)  BUN 8 - 27 mg/dL _0 Creatinine 0.76 - 1.27 mg/dL 1.09 1.10 1.05  Sodium 134 - 144 mmol/L 136 140 140  Potassium 3.5 - 5.2 mmol/L 4.7 4.8 4.3  Chloride 96 - 106 mmol/L 99 102 105  CO2 20 - 29 mmol/L _1 Calcium 8.6 - 10.2 mg/dL 9.4 9.6 9.1  Total Protein 6.0 - 8.5 g/dL 6.9 6.8 6.5  Total Bilirubin 0.0 - 1.2 mg/dL 0.3 0.2 0.3  Alkaline Phos 44 - 121 IU/L 88 88 81  AST 0 - 40 IU/L _2 ALT 0 - 44 IU/L _3 Assessment & Plan:   1) Type 2 diabetes mellitus with vascular disease ([place])  His diabetes is complicated by coronary artery disease status post CABG in 2006 and recent stent placement in 2014.  He presents today with his logs, no meter, showing widely fluctuating glycemic profile.  His POCT A1c today is 6.9% stable from last visit of 7%.  He reports he has not been eating on any routine schedule lately which is contributing to his fluctuations.  He denies any significant hypoglycemia.  He has a CGM device but does not always use it due to problems with the sensor falling off.    -Recent labs reviewed.  POCT UM shows mild microalbuminuria.  He is advised to drink plenty of water throughout the day (something he admits he doesn't do enough of).  - [name] remains at a high risk for more acute and chronic complications of diabetes which include CAD, CVA, CKD, retinopathy, and neuropathy. These are all discussed in detail with the patient.  - Nutritional counseling repeated at each appointment due to patients tendency to fall back in to old habits.  - The patient admits there is a room for improvement in their diet and drink choices. -  Suggestion is made for the patient to avoid simple carbohydrates from their diet including Cakes, Sweet Desserts / Pastries, Ice Cream, Soda (diet and regular),  Sweet Tea, Candies, Chips, Cookies, Sweet Pastries, Store  Bought Juices, Alcohol in Excess of 1-2 drinks a day, Artificial Sweeteners, Coffee Creamer, and "Sugar-free" Products. This will help patient to have stable blood glucose profile and potentially avoid unintended weight gain.   - I encouraged the patient to switch to unprocessed or minimally processed complex starch and increased protein intake (animal or plant source), fruits, and vegetables.   - Patient is advised to stick to a routine mealtimes to eat 3 meals a day and avoid unnecessary snacks (to snack only to correct hypoglycemia).  - I have approached patient with the following individualized plan to manage diabetes and patient agrees.  -Based on his stable, at goal glycemic profile, no changes will be made to his regimen today.  He is advised to continue Lantus 70 units SQ nightly, Novolog 10-16 units TID with meals if glucose is above 90 and he is eating (Specific instructions on how to titrate insulin dosage based on glucose readings given to patient in writing), and Metformin 1000 mg po twice daily with meals.  He is also advised to be more consistent with his meal pattern as well to avoid drastic fluctuations.  -He is encouraged to continue to monitor glucose 4 times daily, before meals and before bed, and call the clinic if he has readings less than 70 or greater than 300 for 3 tests in a row.  2) Lipids/HPL:  His most recent lipid panel from 08/05/20 shows uncontrolled LDL at 136 and elevated triglycerides at 346 (worsening).  He is advised to continue Lipitor 20 mg po daily at bedtime and Fenofibrate 48 mg po daily.  Side effects and precautions discussed with him.  He is advised to avoid fried foods and butter.  3) Hypertension:  His blood pressure is still not controlled to target.  He is advised to continue Coreg 12.5 mg po twice daily, Diltiazem 120 mg po daily, HCTZ 25 mg po daily, and Lisinopril 20 mg po daily.    4) Weight  management- His Body mass index is 29.91 kg/m.  He will benefit from moderate weight loss.  Exercise, carbs management discussed with him.  I advised patient to maintain close follow up with Dr. Sharilyn Sites for primary care needs.  He is  also advised to maintain close follow-up with his cardiologist.     I spent 40 minutes in the care of the patient today including review of labs from [place], Lipids, Thyroid Function, Hematology (current and previous including abstractions from other facilities); face-to-face time discussing  his blood glucose readings/logs, discussing hypoglycemia and hyperglycemia episodes and symptoms, medications doses, his options of short and long term treatment based on the latest standards of care / guidelines;  discussion about incorporating lifestyle medicine;  and documenting the encounter.    Please refer to Patient Instructions for Blood Glucose Monitoring and Insulin/Medications Dosing Guide"  in media tab for additional information. Please  also refer to " Patient Self Inventory" in the Media  tab for reviewed elements of pertinent patient history.  Francis Hall participated in the discussions, expressed understanding, and voiced agreement with the above plans.  All questions were answered to his satisfaction. he is encouraged to contact clinic should he have any questions or concerns prior to his return visit.   Follow up plan: Return in about 4 months (around 08/16/2021) for Diabetes F/U with A1c in office, No previsit labs, Bring meter and logs.  Rayetta Pigg, [facility] [address] [place], [place] [address] Phone: [phone] Fax: [phone]  04/18/2021, 11:48 AM

## 2021-04-19 ENCOUNTER — Telehealth: Payer: Self-pay

## 2021-04-19 NOTE — Telephone Encounter (Signed)
-----   Message from Primus Bravo, MD sent at 04/19/2021  1:17 PM EST ----- Please notify patient that his PSA is normal.  Recommend follow-up in 3 months with repeat PSA.

## 2021-04-19 NOTE — Telephone Encounter (Signed)
Called advised pt of his PSA level pt understood his results.

## 2021-04-21 LAB — URINALYSIS, ROUTINE W REFLEX MICROSCOPIC
Bilirubin, UA: NEGATIVE
Glucose, UA: NEGATIVE
Leukocytes,UA: NEGATIVE
Nitrite, UA: NEGATIVE
RBC, UA: NEGATIVE
Specific Gravity, UA: 1.025 (ref 1.005–1.030)
Urobilinogen, Ur: 1 mg/dL (ref 0.2–1.0)
pH, UA: 5.5 (ref 5.0–7.5)

## 2021-04-21 LAB — MICROSCOPIC EXAMINATION
Bacteria, UA: NONE SEEN
RBC, Urine: NONE SEEN /hpf (ref 0–2)
Renal Epithel, UA: NONE SEEN /hpf
WBC, UA: NONE SEEN /hpf (ref 0–5)

## 2021-04-25 ENCOUNTER — Ambulatory Visit: Payer: Medicare Other | Admitting: Cardiovascular Disease

## 2021-04-25 ENCOUNTER — Other Ambulatory Visit: Payer: Self-pay

## 2021-04-25 ENCOUNTER — Encounter: Payer: Self-pay | Admitting: Cardiovascular Disease

## 2021-04-25 VITALS — BP 152/60 | HR 73 | Ht 68.0 in | Wt 189.8 lb

## 2021-04-25 DIAGNOSIS — G720 Drug-induced myopathy: Secondary | ICD-10-CM

## 2021-04-25 DIAGNOSIS — E782 Mixed hyperlipidemia: Secondary | ICD-10-CM | POA: Diagnosis not present

## 2021-04-25 DIAGNOSIS — I25810 Atherosclerosis of coronary artery bypass graft(s) without angina pectoris: Secondary | ICD-10-CM

## 2021-04-25 DIAGNOSIS — T466X5A Adverse effect of antihyperlipidemic and antiarteriosclerotic drugs, initial encounter: Secondary | ICD-10-CM

## 2021-04-25 DIAGNOSIS — Z794 Long term (current) use of insulin: Secondary | ICD-10-CM

## 2021-04-25 DIAGNOSIS — I10 Essential (primary) hypertension: Secondary | ICD-10-CM

## 2021-04-25 DIAGNOSIS — E118 Type 2 diabetes mellitus with unspecified complications: Secondary | ICD-10-CM

## 2021-04-25 DIAGNOSIS — E663 Overweight: Secondary | ICD-10-CM

## 2021-04-25 MED ORDER — AMLODIPINE BESYLATE 5 MG PO TABS: 5.0000 mg | ORAL_TABLET | Freq: Every day | ORAL | 3 refills | Status: DC

## 2021-04-25 NOTE — Progress Notes (Signed)
Cardiology Office Note    Date:  04/29/2021   ID:  Semir, Brill Apr 27, 1956, MRN [id number]  PCP:  Sharilyn Sites, MD  Cardiologist:   Sanda Klein, MD   Chief complaint: CAD  History of Present Illness:  Francis Hall is a 65 y.o. male with coronary artery disease and previous bypass surgery, diastolic heart failure, insulin-requiring diabetes mellitus, hypertension, mixed hyperlipidemia with severe hypertriglyceridemia.  Statin related myopathy.  Francis Hall denies any problems with chest pain or shortness of breath either at rest or with activity.  He is still trying to eat better and lose some weight, but has actually gained 10 pounds since the summer.  He had to stop the atorvastatin since it caused intolerable aches.  His systolic blood pressure has been relatively high in the low 150s.  He has not tolerated rosuvastatin in the past.  He did not tolerate high-dose atorvastatin, but was taking 20 mg without side effects until this year.  He now has generalized aches that are intolerable and had to stop the medication.  He denies problems with lower extremity edema, orthopnea, PND, palpitations syncope, focal neurological complaints, falls or bleeding problems.  Metabolic parameters do show some improvement.  His hemoglobin A1c is down to 6.9% (previously 7.2%) and his triglycerides are down to 252 (previously 278-400) and his HDL was up to 41 (the first time ever>40 to my knowledge).  Unfortunately, without statin his LDL is well over 130.  Francis Hall had multivessel bypass surgery in 2006 (LIMA to LAD, free radial to circumflex) and in 2014 presented with a small non-STEMI and required percutaneous revascularization (proximal posterolateral artery 2.25 x 16 mm Promus and mid right coronary artery 2.5 x 38 mm Promus and balloon angioplasty of the ostium of the PDA). He is a "Plavix nonresponder", on ticagrelor chronically.  Past Medical History:  Diagnosis Date   Atrial fibrillation ([place])     CAD (coronary artery disease)    [facility]   CHF (congestive heart failure) ([place]) 11/08/2010   2D Echo EF=>55%   Diabetes mellitus    insulin   Diverticulosis    Dyspnea    Family hx of colon cancer    GERD (gastroesophageal reflux disease)    HTN (hypertension)    Hyperlipemia    Hyperplastic colon polyp 07/16/07   30cm    Myocardial infarction ([place]) 12/2012   OSA (obstructive sleep apnea) 12/21/2007   sleep study perform REM 74mns REM and NREM was 95.0%, AHI of 12.31hr and RDI of 12.4hr    Past Surgical History:  Procedure Laterality Date   CARDIAC CATHETERIZATION  2006 2011   2 stents   CATARACT EXTRACTION W/PHACO Left 12/03/2013   Procedure: CATARACT EXTRACTION PHACO AND INTRAOCULAR LENS PLACEMENT ([place];  Surgeon: KTonny Branch MD;  Location: [facility];  Service: Ophthalmology;  Laterality: Left;  CDE:7.07   CATARACT EXTRACTION W/PHACO Right 12/14/2013   Procedure: CATARACT EXTRACTION PHACO AND INTRAOCULAR LENS PLACEMENT ([facility]);  Surgeon: KTonny Branch MD;  Location: [facility];  Service: Ophthalmology;  Laterality: Right;  CDE 7.84    COLONOSCOPY  07/2007   Dr JArnoldo Morale hyperplastic polyp 30cm   COLONOSCOPY N/A 08/12/2012   Procedure: COLONOSCOPY;  Surgeon: SDanie Binder MD;  Location: [facility];  Service: Endoscopy;  Laterality: N/A;  8:30   COLONOSCOPY N/A 11/11/2017   Dr. FOneida Alar multiple tubular adenomas, next surveillance in 2022.    COLONOSCOPY WITH PROPOFOL N/A 01/10/2021   Procedure: COLONOSCOPY WITH PROPOFOL;  Surgeon: CHurshel Keys  K, DO;  Location: [facility];  Service: Endoscopy;  Laterality: N/A;  8:45 / ASA II   CORONARY ARTERY BYPASS GRAFT  06/2004   x2 performed for 40% left main stenosis, 80% proximal LAD stenosis of the first diagonal artery, and 50% left circumfles artery priximal stenosis.LIMA to LAD and free radial artery to circumflex coronary artery   LEFT HEART CATHETERIZATION WITH CORONARY/GRAFT ANGIOGRAM  12/29/2012   Procedure: LEFT HEART  CATHETERIZATION WITH Beatrix Fetters;  Surgeon: Leonie Man, MD;  [facility]: [facility] LAB;  Service: Cardiovascular;;   POLYPECTOMY  11/11/2017   Procedure: POLYPECTOMY;  Surgeon: Danie Binder, MD;  Location: [facility];  Service: Endoscopy;;  ascending colon, transverse, splenic flexure, sigmoid   POLYPECTOMY  01/10/2021   Procedure: POLYPECTOMY;  Surgeon: Eloise Harman, DO;  Location: [facility];  Service: Endoscopy;;   TONSILLECTOMY  1962    Current Medications: Outpatient Medications Prior to Visit  Medication Sig Dispense Refill   acetaminophen (TYLENOL) 325 MG tablet Take 650 mg by mouth as needed for moderate pain or headache.     aspirin 81 MG EC tablet Take 81 mg by mouth daily.     atorvastatin (LIPITOR) 20 MG tablet TAKE 1 TABLET BY MOUTH EVERY DAY AT BEDTIME (Patient taking differently: Take 20 mg by mouth at bedtime.) 90 tablet 3   blood glucose meter kit and supplies KIT Dispense based on patient and insurance preference. Use up to four times daily as directed. (FOR ICD-10 E11.65) 1 each 5   blood glucose meter kit and supplies 1 each by Other route 4 (four) times daily. Dispense based on patient and insurance preference. Use up to four times daily as directed. (FOR ICD-10 E10.9, E11.9). 1 each 0   carvedilol (COREG) 12.5 MG tablet TAKE 1 TABLET(12.5 MG) BY MOUTH TWICE DAILY WITH A MEAL 180 tablet 3   Cholecalciferol (VITAMIN D3) 25 MCG (1000 UT) CAPS Take 2 capsules (2,000 Units total) by mouth daily. (Patient taking differently: Take 1,000 Units by mouth daily.) 60 capsule 3   fenofibrate (TRICOR) 48 MG tablet TAKE 1 TABLET(48 MG) BY MOUTH DAILY 90 tablet 0   hydrochlorothiazide (HYDRODIURIL) 25 MG tablet Take 1 tablet (25 mg total) by mouth daily. TAKE 1 TABLET(25 MG) BY MOUTH DAILY 90 tablet 1   insulin glargine (LANTUS SOLOSTAR) 100 UNIT/ML Solostar Pen Inject 70 Units into the skin at bedtime. 60 mL 3   insulin lispro (HUMALOG KWIKPEN) 100 UNIT/ML KwikPen  Inject 10-16 Units into the skin 3 (three) times daily before meals. 60 mL 3   lisinopril (ZESTRIL) 20 MG tablet TAKE 1 TABLET BY MOUTH TWICE DAILY 180 tablet 1   metFORMIN (GLUCOPHAGE) 1000 MG tablet Take 1 tablet (1,000 mg total) by mouth 2 (two) times daily with a meal. 180 tablet 3   Multiple Vitamin (MULTIVITAMIN) capsule Take 1 capsule by mouth daily.     ONETOUCH VERIO test strip USE UP TO FOUR TIMES DAILY AS DIRECTED 300 strip 1   pantoprazole (PROTONIX) 40 MG tablet Take 40 mg by mouth daily.     vitamin C (ASCORBIC ACID) 500 MG tablet Take 500 mg by mouth daily.     diltiazem (CARDIZEM CD) 120 MG 24 hr capsule Take 1 capsule (120 mg total) by mouth daily. 90 capsule 1   Continuous Blood Gluc Sensor (FREESTYLE LIBRE 14 DAY SENSOR) MISC Inject 1 each into the skin every 14 (fourteen) days. Use as directed to check blood glucose four  times daily (Patient not taking: Reported on 04/25/2021) 2 each 3   nitroGLYCERIN (NITROSTAT) 0.4 MG SL tablet PLACE 1 TABLET UNDER TONGUE IF NEEDED FOR CHEST PAIN.[name] OF 3 DOSES (Patient not taking: Reported on 04/25/2021) 25 tablet 6   No facility-administered medications prior to visit.     Allergies:   Crestor [rosuvastatin]   Social History   Socioeconomic History   Marital status: Married    Spouse name: Not on file   Number of children: 2   Years of education: 12   Highest education level: Not on file  Occupational History   Occupation: disabled Dealer   Tobacco Use   Smoking status: Never   Smokeless tobacco: Never  Vaping Use   Vaping Use: Never used  Substance and Sexual Activity   Alcohol use: Yes    Comment: social-2 beers/wk   Drug use: No   Sexual activity: Yes    Birth control/protection: None  Other Topics Concern   Not on file  Social History Narrative   Not on file   Social Determinants of Health   Financial Resource Strain: Not on file  Food Insecurity: Not on file  Transportation Needs: Not on file  Physical  Activity: Not on file  Stress: Not on file  Social Connections: Not on file     Family History:  The patient's family history includes Arthritis in an other family member; Colon cancer in his father; Coronary artery disease (age of onset: 88) in his father; Diabetes in an other family member; Heart disease in an other family member; Lung cancer in his father.   ROS:   Please see the history of present illness.    All other systems are reviewed and are negative.   PHYSICAL EXAM:   VS:  BP (!) 152/60 (BP Location: Left Arm, Patient Position: Sitting, Cuff Size: Normal)   Pulse 73   Ht '5\' 8"'  (1.727 m)   Wt 189 lb 12.8 oz (86.1 kg)   SpO2 96%   BMI 28.86 kg/m      General: Alert, oriented x3, no distress, overweight Head: no evidence of trauma, PERRL, EOMI, no exophtalmos or lid lag, no myxedema, no xanthelasma; normal ears, nose and oropharynx Neck: normal jugular venous pulsations and no hepatojugular reflux; brisk carotid pulses without delay and no carotid bruits Chest: clear to auscultation, no signs of consolidation by percussion or palpation, normal fremitus, symmetrical and full respiratory excursions Cardiovascular: normal position and quality of the apical impulse, regular rhythm, normal first and second heart sounds, no murmurs, rubs or gallops Abdomen: no tenderness or distention, no masses by palpation, no abnormal pulsatility or arterial bruits, normal bowel sounds, no hepatosplenomegaly Extremities: no clubbing, cyanosis or edema; 2+ radial, ulnar and brachial pulses bilaterally; 2+ right femoral, posterior tibial and dorsalis pedis pulses; 2+ left femoral, posterior tibial and dorsalis pedis pulses; no subclavian or femoral bruits Neurological: grossly nonfocal Psych: Normal mood and affect   Wt Readings from Last 3 Encounters:  04/25/21 189 lb 12.8 oz (86.1 kg)  04/18/21 191 lb (86.6 kg)  01/10/21 180 lb (81.6 kg)      Studies/Labs Reviewed:   EKG:  EKG is  ordered today.  The ekg ordered today borderline obese normal sinus rhythm and is a completely normal tracing   Recent Labs: 04/12/2021: ALT 18; BUN 19; Creatinine, Ser 1.09; Potassium 4.7; Sodium 136   Lipid Panel    Component Value Date/Time   CHOL 233 (H) 08/05/2020 0821   TRIG  346 (H) 08/05/2020 0821   HDL 34 (L) 08/05/2020 0821   CHOLHDL 6.9 (H) 08/05/2020 0821   CHOLHDL 7.0 12/29/2012 0438   VLDL UNABLE TO CALCULATE IF TRIGLYCERIDE OVER 400 mg/dL 12/29/2012 0438   LDLCALC 136 (H) 08/05/2020 0821   11/27/2018 Chol 179, HDL 27, TG 408 Creat 0.97 12/24/2018 A1c 7.3% 04/14/2020  hemoglobin A1c 7.2%  03/07/2021 Total cholesterol 228, HDL 41, triglycerides 252 Hemoglobin 12.0  04/12/2021 Creatinine 1.09, potassium 4.7, ALT 18  04/18/2021 hemoglobin A1c 6.9%  Additional studies/ records that were reviewed today include:    ASSESSMENT:    1. Coronary artery disease involving coronary bypass graft of native heart without angina pectoris   2. Essential hypertension   3. Mixed hyperlipidemia   4. Type 2 diabetes mellitus with complication, with long-term current use of insulin ([place])   5. Overweight   6. Statin myopathy       PLAN:  In order of problems listed above:  CHF: Euvolemic without diuretics and NYHA functional class I.  Previously he did not tolerate higher doses of carvedilol due to fatigue, reduction in energy, but will try again since his blood pressure is high.   Have discussed adding Francis Hall, but cost was an issue.  Even though he is well compensated, this would be preferable to insulin as far as long-term cardiovascular prognosis. CAD s/p CABG 2006, PCI-RCA 2014: Angina free/asymptomatic.  Known to be a clopidogrel nonresponder. HTN: Switch from diltiazem to amlodipine to see if this allows him to tolerate the higher dose of carvedilol without fatigue.  Target systolic blood pressure 161 or less. HLP: Intolerant of rosuvastatin.  Previously intolerant  of higher doses of atorvastatin now unable to even take the 20 mg dose.  Need to try Repatha or Praluent or Leqvio.  His wife is on Praluent and we could start with that.  Triglycerides have improved.  Without statin therapy we could increase the dose of fenofibrate seen in usual dose.   DM: A1c is now in target range, less than 7%.  Would recommend a SGLT2 inhibitor, since this is the only agent currently shown to improve outcomes with diastolic heart failure.  He did take [place] back in 2018-2019, after being on Invokana in 2016-2017.  Discuss restarting Francis Hall with his endocrinologist.   Overweight: He has done an excellent job with weight reduction; congratulated.  But he needs to be persistent, since he has gained back some weight recently   Medication Adjustments/Labs and Tests Ordered: Current medicines are reviewed at length with the patient today.  Concerns regarding medicines are outlined above.  Medication changes, Labs and Tests ordered today are listed in the Patient Instructions below. Patient Instructions  Medication Instructions:  STOP the Dilitazem  START Amlodipine 5 mg once daily  Dr. Sallyanne Kuster recommends Praluent (PCSK9). This is an injectable cholesterol medication. This medication will need prior approval with your insurance company, which we will work on. If the medication is not approved initially, we may need to do an appeal with your insurance. We will keep you updated on this process. This medication can be provided at some local pharmacies or be shipped to you from a specialty pharmacy.   *If you need a refill on your cardiac medications before your next appointment, please call your pharmacy*   Lab Work: Your provider would like for you to return in 3 months to have the following labs drawn: fasting lipid. You do not need an appointment for the lab. Once in our office  lobby there is a podium where you can sign in and ring the doorbell to alert [place] that you are here.  The lab is open from 8:00 am to 4:30 pm; closed for lunch from 12:45pm-1:45pm.  If you have labs (blood work) drawn today and your tests are completely normal, you will receive your results only by: [place] (if you have MyChart) OR A paper copy in the mail If you have any lab test that is abnormal or we need to change your treatment, we will call you to review the results.   Testing/Procedures: None ordered   Follow-Up: At [facility], you and your health needs are our priority.  As part of our continuing mission to provide you with exceptional heart care, we have created designated Provider Care Teams.  These Care Teams include your primary Cardiologist (physician) and Advanced Practice Providers (APPs -  Physician Assistants and Nurse Practitioners) who all work together to provide you with the care you need, when you need it.  We recommend signing up for the patient portal called "MyChart".  Sign up information is provided on this After Visit Summary.  MyChart is used to connect with patients for Virtual Visits (Telemedicine).  Patients are able to view lab/test results, encounter notes, upcoming appointments, etc.  Non-urgent messages can be sent to your provider as well.   To learn more about what you can do with MyChart, go to [email or web address].    Your next appointment:   12 month(s)  The format for your next appointment:   In Person  Provider:   Sanda Klein, MD     Other Instructions Dr. Sallyanne Kuster would like you to check your blood pressure daily for the next 2 weeks.  Keep a journal of these daily blood pressure and heart rate readings and call our office or send a message through [place] with the results. Thank you!  It is best to check your BP 1-2 hours after taking your medications to see the medications effectiveness on your BP.    Here are some tips that our clinical pharmacists share for home BP monitoring:          Rest 10 minutes before taking  your blood pressure.          Don't smoke or drink caffeinated beverages for at least 30 minutes before.          Take your blood pressure before (not after) you eat.          Sit comfortably with your back supported and both feet on the floor (don't cross your legs).          Elevate your arm to heart level on a table or a desk.          Use the proper sized cuff. It should fit smoothly and snugly around your bare upper arm. There should be enough room to slip a fingertip under the cuff. The bottom edge of the cuff should be 1 inch above the crease of the elbow.    Signed, Sanda Klein, MD  04/29/2021 6:18 PM    [facility] [address], [place], [place]  [address] Phone: [phone]; Fax: [phone]

## 2021-04-25 NOTE — Patient Instructions (Signed)
Medication Instructions:  STOP the Dilitazem  START Amlodipine 5 mg once daily  Dr. Sallyanne Kuster recommends Praluent (PCSK9). This is an injectable cholesterol medication. This medication will need prior approval with your insurance company, which we will work on. If the medication is not approved initially, we may need to do an appeal with your insurance. We will keep you updated on this process. This medication can be provided at some local pharmacies or be shipped to you from a specialty pharmacy.   *If you need a refill on your cardiac medications before your next appointment, please call your pharmacy*   Lab Work: Your provider would like for you to return in 3 months to have the following labs drawn: fasting lipid. You do not need an appointment for the lab. Once in our office lobby there is a podium where you can sign in and ring the doorbell to alert [place] that you are here. The lab is open from 8:00 am to 4:30 pm; closed for lunch from 12:45pm-1:45pm.  If you have labs (blood work) drawn today and your tests are completely normal, you will receive your results only by: [place] (if you have MyChart) OR A paper copy in the mail If you have any lab test that is abnormal or we need to change your treatment, we will call you to review the results.   Testing/Procedures: None ordered   Follow-Up: At [facility], you and your health needs are our priority.  As part of our continuing mission to provide you with exceptional heart care, we have created designated Provider Care Teams.  These Care Teams include your primary Cardiologist (physician) and Advanced Practice Providers (APPs -  Physician Assistants and Nurse Practitioners) who all work together to provide you with the care you need, when you need it.  We recommend signing up for the patient portal called "MyChart".  Sign up information is provided on this After Visit Summary.  MyChart is used to connect with patients for Virtual  Visits (Telemedicine).  Patients are able to view lab/test results, encounter notes, upcoming appointments, etc.  Non-urgent messages can be sent to your provider as well.   To learn more about what you can do with MyChart, go to [email or web address].    Your next appointment:   12 month(s)  The format for your next appointment:   In Person  Provider:   Sanda Klein, MD     Other Instructions Dr. Sallyanne Kuster would like you to check your blood pressure daily for the next 2 weeks.  Keep a journal of these daily blood pressure and heart rate readings and call our office or send a message through [place] with the results. Thank you!  It is best to check your BP 1-2 hours after taking your medications to see the medications effectiveness on your BP.    Here are some tips that our clinical pharmacists share for home BP monitoring:          Rest 10 minutes before taking your blood pressure.          Don't smoke or drink caffeinated beverages for at least 30 minutes before.          Take your blood pressure before (not after) you eat.          Sit comfortably with your back supported and both feet on the floor (don't cross your legs).          Elevate your arm to heart level on a table or  a desk.          Use the proper sized cuff. It should fit smoothly and snugly around your bare upper arm. There should be enough room to slip a fingertip under the cuff. The bottom edge of the cuff should be 1 inch above the crease of the elbow.

## 2021-04-28 DIAGNOSIS — Z961 Presence of intraocular lens: Secondary | ICD-10-CM | POA: Diagnosis not present

## 2021-04-28 DIAGNOSIS — Z794 Long term (current) use of insulin: Secondary | ICD-10-CM | POA: Diagnosis not present

## 2021-04-28 DIAGNOSIS — E119 Type 2 diabetes mellitus without complications: Secondary | ICD-10-CM | POA: Diagnosis not present

## 2021-04-28 DIAGNOSIS — Z7984 Long term (current) use of oral hypoglycemic drugs: Secondary | ICD-10-CM | POA: Diagnosis not present

## 2021-04-28 LAB — HM DIABETES EYE EXAM

## 2021-05-17 ENCOUNTER — Other Ambulatory Visit: Payer: Self-pay | Admitting: "Endocrinology

## 2021-05-29 ENCOUNTER — Other Ambulatory Visit: Payer: Self-pay | Admitting: Cardiology

## 2021-06-20 ENCOUNTER — Other Ambulatory Visit: Payer: Self-pay | Admitting: Cardiovascular Disease

## 2021-08-17 ENCOUNTER — Ambulatory Visit (INDEPENDENT_AMBULATORY_CARE_PROVIDER_SITE_OTHER): Payer: Medicare Other | Admitting: Nurse Practitioner

## 2021-08-17 ENCOUNTER — Encounter: Payer: Self-pay | Admitting: Nurse Practitioner

## 2021-08-17 ENCOUNTER — Other Ambulatory Visit: Payer: Self-pay

## 2021-08-17 VITALS — BP 146/74 | HR 71 | Ht 67.0 in | Wt 193.4 lb

## 2021-08-17 DIAGNOSIS — I10 Essential (primary) hypertension: Secondary | ICD-10-CM | POA: Diagnosis not present

## 2021-08-17 DIAGNOSIS — E782 Mixed hyperlipidemia: Secondary | ICD-10-CM | POA: Diagnosis not present

## 2021-08-17 DIAGNOSIS — E1159 Type 2 diabetes mellitus with other circulatory complications: Secondary | ICD-10-CM | POA: Diagnosis not present

## 2021-08-17 LAB — POCT GLYCOSYLATED HEMOGLOBIN (HGB A1C): HbA1c, POC (controlled diabetic range): 7.5 % — AB (ref 0.0–7.0)

## 2021-08-17 MED ORDER — INSULIN LISPRO (1 UNIT DIAL) 100 UNIT/ML (KWIKPEN): 10.0000 [IU] | PEN_INJECTOR | Freq: Three times a day (TID) | SUBCUTANEOUS | 3 refills | Status: DC

## 2021-08-17 MED ORDER — LANTUS SOLOSTAR 100 UNIT/ML ~~LOC~~ SOPN: 70.0000 [IU] | PEN_INJECTOR | Freq: Every day | SUBCUTANEOUS | 3 refills | Status: DC

## 2021-08-17 MED ORDER — METFORMIN HCL 1000 MG PO TABS: 1000.0000 mg | ORAL_TABLET | Freq: Two times a day (BID) | ORAL | 3 refills | Status: DC

## 2021-08-17 NOTE — Patient Instructions (Signed)

## 2021-08-17 NOTE — Addendum Note (Signed)
Addended by: Brita Romp on: 08/17/2021 11:06 AM ? ? Modules accepted: Orders ? ?

## 2021-08-17 NOTE — Progress Notes (Signed)
? ?       08/17/2021 ? ? ?Endocrinology follow-up note ? ? ?Subjective:  ? ? Patient ID: Francis Hall, male    DOB: Oct 16, 1955,  ? ? ?Past Medical History:  ?Diagnosis Date  ? Atrial fibrillation ([place])   ? CAD (coronary artery disease)   ? [place]  ? CHF (congestive heart failure) ([place]) 11/08/2010  ? 2D Echo EF=>55%  ? Diabetes mellitus   ? insulin  ? Diverticulosis   ? Dyspnea   ? Family hx of colon cancer   ? GERD (gastroesophageal reflux disease)   ? HTN (hypertension)   ? Hyperlipemia   ? Hyperplastic colon polyp 07/16/07  ? 30cm   ? Myocardial infarction [facility]) 12/2012  ? OSA (obstructive sleep apnea) 12/21/2007  ? sleep study perform REM 23mns REM and NREM was 95.0%, AHI of 12.31hr and RDI of 12.4hr  ? ?Past Surgical History:  ?Procedure Laterality Date  ? CARDIAC CATHETERIZATION  2006 2011  ? 2 stents  ? CATARACT EXTRACTION W/PHACO Left 12/03/2013  ? Procedure: CATARACT EXTRACTION PHACO AND INTRAOCULAR LENS PLACEMENT (IOC);  Surgeon: KTonny Branch MD;  Location: [facility];  Service: Ophthalmology;  Laterality: Left;  CDE:7.07  ? CATARACT EXTRACTION W/PHACO Right 12/14/2013  ? Procedure: CATARACT EXTRACTION PHACO AND INTRAOCULAR LENS PLACEMENT (IOC);  Surgeon: KTonny Branch MD;  Location: [facility];  Service: Ophthalmology;  Laterality: Right;  CDE 7.84 ?  ? COLONOSCOPY  07/2007  ? Dr JArnoldo Morale hyperplastic polyp 30cm  ? COLONOSCOPY N/A 08/12/2012  ? Procedure: COLONOSCOPY;  Surgeon: SDanie Binder MD;  Location: [facility];  Service: Endoscopy;  Laterality: N/A;  8:30  ? COLONOSCOPY N/A 11/11/2017  ? Dr. FOneida Alar multiple tubular adenomas, next surveillance in 2022.   ? COLONOSCOPY WITH PROPOFOL N/A 01/10/2021  ? Procedure: COLONOSCOPY WITH PROPOFOL;  Surgeon: CEloise Harman DO;  Location: [facility];  Service: Endoscopy;  Laterality: N/A;  8:45 / ASA II  ? CORONARY ARTERY BYPASS GRAFT  06/2004  ? x2 performed for 40% left main stenosis, 80% proximal LAD stenosis of the first diagonal artery, and 50% left circumfles  artery priximal stenosis.LIMA to LAD and free radial artery to circumflex coronary artery  ? LEFT HEART CATHETERIZATION WITH CORONARY/GRAFT ANGIOGRAM  12/29/2012  ? Procedure: LEFT HEART CATHETERIZATION WITH CBeatrix Fetters  Surgeon: DLeonie Man MD;  [facility];  Service: Cardiovascular;;  ? POLYPECTOMY  11/11/2017  ? Procedure: POLYPECTOMY;  Surgeon: FDanie Binder MD;  Location: [facility];  Service: Endoscopy;;  ascending colon, transverse, splenic flexure, sigmoid  ? POLYPECTOMY  01/10/2021  ? Procedure: POLYPECTOMY;  Surgeon: CEloise Harman DO;  Location: [facility];  Service: Endoscopy;;  ? TONSILLECTOMY  1962  ? ?Social History  ? ?Socioeconomic History  ? Marital status: Married  ?  Spouse name: Not on file  ? Number of children: 2  ? Years of education: 19 ? Highest education level: Not on file  ?Occupational History  ? Occupation: disabled mDealer  ?Tobacco Use  ? Smoking status: Never  ? Smokeless tobacco: Never  ?Vaping Use  ? Vaping Use: Never used  ?Substance and Sexual Activity  ? Alcohol use: Yes  ?  Comment: social-2 beers/wk  ? Drug use: No  ? Sexual activity: Yes  ?  Birth control/protection: None  ?Other Topics Concern  ? Not on file  ?Social History Narrative  ? Not on file  ? ?Social Determinants of Health  ? ?  Financial Resource Strain: Not on file  ?Food Insecurity: Not on file  ?Transportation Needs: Not on file  ?Physical Activity: Not on file  ?Stress: Not on file  ?Social Connections: Not on file  ? ?Outpatient Encounter Medications as of 08/17/2021  ?Medication Sig  ? acetaminophen (TYLENOL) 325 MG tablet Take 650 mg by mouth as needed for moderate pain or headache.  ? aspirin 81 MG EC tablet Take 81 mg by mouth daily.  ? blood glucose meter kit and supplies KIT Dispense based on patient and insurance preference. Use up to four times daily as directed. (FOR ICD-10 E11.65)  ? blood glucose meter kit and supplies 1 each by Other route 4 (four) times  daily. Dispense based on patient and insurance preference. Use up to four times daily as directed. (FOR ICD-10 E10.9, E11.9).  ? carvedilol (COREG) 12.5 MG tablet TAKE 1 TABLET(12.5 MG) BY MOUTH TWICE DAILY WITH A MEAL  ? Cholecalciferol (VITAMIN D3) 25 MCG (1000 UT) CAPS Take 2 capsules (2,000 Units total) by mouth daily. (Patient taking differently: Take 1,000 Units by mouth daily.)  ? Continuous Blood Gluc Sensor (FREESTYLE LIBRE 14 DAY SENSOR) MISC Inject 1 each into the skin every 14 (fourteen) days. Use as directed to check blood glucose four times daily  ? Evolocumab (REPATHA) 140 MG/ML SOSY Inject into the skin every 14 (fourteen) days.  ? fenofibrate (TRICOR) 48 MG tablet TAKE 1 TABLET(48 MG) BY MOUTH DAILY  ? hydrochlorothiazide (HYDRODIURIL) 25 MG tablet Take 1 tablet (25 mg total) by mouth daily. TAKE 1 TABLET(25 MG) BY MOUTH DAILY  ? insulin glargine (LANTUS SOLOSTAR) 100 UNIT/ML Solostar Pen Inject 70 Units into the skin at bedtime.  ? insulin lispro (HUMALOG KWIKPEN) 100 UNIT/ML KwikPen Inject 10-16 Units into the skin 3 (three) times daily before meals.  ? lisinopril (ZESTRIL) 20 MG tablet TAKE 1 TABLET BY MOUTH TWICE DAILY  ? metFORMIN (GLUCOPHAGE) 1000 MG tablet Take 1 tablet (1,000 mg total) by mouth 2 (two) times daily with a meal.  ? Multiple Vitamin (MULTIVITAMIN) capsule Take 1 capsule by mouth daily.  ? nitroGLYCERIN (NITROSTAT) 0.4 MG SL tablet PLACE 1 TABLET UNDER TONGUE IF NEEDED FOR CHEST PAIN.[name] OF 3 DOSES  ? ONETOUCH VERIO test strip USE UP TO FOUR TIMES DAILY AS DIRECTED  ? pantoprazole (PROTONIX) 40 MG tablet Take 40 mg by mouth daily.  ? vitamin C (ASCORBIC ACID) 500 MG tablet Take 500 mg by mouth daily.  ? amLODipine (NORVASC) 5 MG tablet Take 1 tablet (5 mg total) by mouth daily.  ? atorvastatin (LIPITOR) 20 MG tablet TAKE 1 TABLET BY MOUTH EVERY DAY AT BEDTIME (Patient not taking: Reported on 08/17/2021)  ? ?No facility-administered encounter medications on file as of 08/17/2021.   ? ?ALLERGIES: ?Allergies  ?Allergen Reactions  ? Crestor [Rosuvastatin] Other (See Comments)  ?  myalgia  ? ?VACCINATION STATUS: ? ?There is no immunization history on file for this patient. ? ?Diabetes ?He presents for his follow-up diabetic visit. He has type 2 diabetes mellitus. Onset time: He was diagnosed at approximate age of 45 years. His disease course has been worsening. There are no hypoglycemic associated symptoms. Pertinent negatives for hypoglycemia include no confusion, pallor or seizures. There are no diabetic associated symptoms. Pertinent negatives for diabetes include no fatigue, no polydipsia, no polyphagia, no polyuria and no weakness. There are no hypoglycemic complications. Symptoms are stable. Diabetic complications include heart disease, nephropathy and peripheral neuropathy. Risk factors for coronary artery disease include dyslipidemia,  diabetes mellitus, hypertension, male sex, sedentary lifestyle and obesity. Current diabetic treatment includes intensive insulin program and oral agent (monotherapy). He is compliant with treatment most of the time. His weight is increasing steadily. He is following a generally healthy diet. When asked about meal planning, he reported none. He has had a previous visit with a dietitian. He participates in exercise intermittently. His home blood glucose trend is increasing steadily. (He presents today with his meter and logs showing above target fasting and at goal postprandial glycemic profile.  His POCT A1c today is 7.5%, increasing from last visit of 6.9%.  He admits he has been late night snacking and has not been as active as he usually is during the winter months.  He denies any significant hypoglycemia.  He notes his glucose started rising when he stopped his Statin and went on Repatha.) An ACE inhibitor/angiotensin II receptor blocker is being taken. He does not see a podiatrist.Eye exam is current.  ?Hyperlipidemia ?This is a chronic problem. The  current episode started more than 1 year ago. The problem is uncontrolled. Recent lipid tests were reviewed and are variable. Exacerbating diseases include chronic renal disease, diabetes and obesity. Factor

## 2021-08-28 ENCOUNTER — Other Ambulatory Visit: Payer: Self-pay | Admitting: Cardiovascular Disease

## 2021-09-14 DIAGNOSIS — M79672 Pain in left foot: Secondary | ICD-10-CM | POA: Diagnosis not present

## 2021-09-14 DIAGNOSIS — B070 Plantar wart: Secondary | ICD-10-CM | POA: Diagnosis not present

## 2021-09-26 ENCOUNTER — Other Ambulatory Visit: Payer: Self-pay | Admitting: Cardiovascular Disease

## 2021-10-15 ENCOUNTER — Other Ambulatory Visit: Payer: Self-pay | Admitting: "Endocrinology

## 2021-10-19 DIAGNOSIS — Z4889 Encounter for other specified surgical aftercare: Secondary | ICD-10-CM | POA: Diagnosis not present

## 2021-11-22 ENCOUNTER — Other Ambulatory Visit: Payer: Self-pay | Admitting: Cardiology

## 2021-12-13 DIAGNOSIS — E1159 Type 2 diabetes mellitus with other circulatory complications: Secondary | ICD-10-CM | POA: Diagnosis not present

## 2021-12-14 LAB — COMPREHENSIVE METABOLIC PANEL
ALT: 19 IU/L (ref 0–44)
AST: 19 IU/L (ref 0–40)
Albumin/Globulin Ratio: 1.9 (ref 1.2–2.2)
Albumin: 4.2 g/dL (ref 3.9–4.9)
Alkaline Phosphatase: 79 IU/L (ref 44–121)
BUN/Creatinine Ratio: 21 (ref 10–24)
BUN: 26 mg/dL (ref 8–27)
Bilirubin Total: 0.2 mg/dL (ref 0.0–1.2)
CO2: 18 mmol/L — ABNORMAL LOW (ref 20–29)
Calcium: 9.9 mg/dL (ref 8.6–10.2)
Chloride: 101 mmol/L (ref 96–106)
Creatinine, Ser: 1.26 mg/dL (ref 0.76–1.27)
Globulin, Total: 2.2 g/dL (ref 1.5–4.5)
Glucose: 192 mg/dL — ABNORMAL HIGH (ref 70–99)
Potassium: 4.6 mmol/L (ref 3.5–5.2)
Sodium: 138 mmol/L (ref 134–144)
Total Protein: 6.4 g/dL (ref 6.0–8.5)
eGFR: 64 mL/min/{1.73_m2} (ref >59)

## 2021-12-14 LAB — LIPID PANEL
Chol/HDL Ratio: 7 ratio — ABNORMAL HIGH (ref 0.0–5.0)
Cholesterol, Total: 244 mg/dL — ABNORMAL HIGH (ref 100–199)
HDL: 35 mg/dL — ABNORMAL LOW (ref >39)
LDL Chol Calc (NIH): 138 mg/dL — ABNORMAL HIGH (ref 0–99)
Triglycerides: 388 mg/dL — ABNORMAL HIGH (ref 0–149)
VLDL Cholesterol Cal: 71 mg/dL — ABNORMAL HIGH (ref 5–40)

## 2021-12-14 LAB — T4, FREE: Free T4: 1.19 ng/dL (ref 0.82–1.77)

## 2021-12-14 LAB — TSH: TSH: 3.15 u[IU]/mL (ref 0.450–4.500)

## 2021-12-20 ENCOUNTER — Encounter: Payer: Self-pay | Admitting: Nurse Practitioner

## 2021-12-20 ENCOUNTER — Ambulatory Visit (INDEPENDENT_AMBULATORY_CARE_PROVIDER_SITE_OTHER): Payer: Medicare Other | Admitting: Nurse Practitioner

## 2021-12-20 VITALS — BP 152/82 | HR 78 | Ht 67.0 in | Wt 189.0 lb

## 2021-12-20 DIAGNOSIS — E1159 Type 2 diabetes mellitus with other circulatory complications: Secondary | ICD-10-CM | POA: Diagnosis not present

## 2021-12-20 LAB — POCT GLYCOSYLATED HEMOGLOBIN (HGB A1C): HbA1c POC (<> result, manual entry): 7.3 % (ref 4.0–5.6)

## 2021-12-20 MED ORDER — LANTUS SOLOSTAR 100 UNIT/ML ~~LOC~~ SOPN
55.0000 [IU] | PEN_INJECTOR | Freq: Every day | SUBCUTANEOUS | 3 refills | Status: DC
Start: 2021-12-20 — End: 2022-04-25

## 2021-12-20 NOTE — Progress Notes (Signed)
12/20/2021   Endocrinology follow-up note   Subjective:    Patient ID: Francis Hall, male    DOB: Nov 08, 1955,    Past Medical History:  Diagnosis Date   Atrial fibrillation ([place])    CAD (coronary artery disease)    [place]   CHF (congestive heart failure) ([place]) 11/08/2010   2D Echo EF=>55%   Diabetes mellitus    insulin   Diverticulosis    Dyspnea    Family hx of colon cancer    GERD (gastroesophageal reflux disease)    HTN (hypertension)    Hyperlipemia    Hyperplastic colon polyp 07/16/07   30cm    Myocardial infarction ([place]) 12/2012   OSA (obstructive sleep apnea) 12/21/2007   sleep study perform REM 16mns REM and NREM was 95.0%, AHI of 12.31hr and RDI of 12.4hr   Past Surgical History:  Procedure Laterality Date   CARDIAC CATHETERIZATION  2006 2011   2 stents   CATARACT EXTRACTION W/PHACO Left 12/03/2013   Procedure: CATARACT EXTRACTION PHACO AND INTRAOCULAR LENS PLACEMENT ([place];  Surgeon: KTonny Branch MD;  Location: [facility];  Service: Ophthalmology;  Laterality: Left;  CDE:7.07   CATARACT EXTRACTION W/PHACO Right 12/14/2013   Procedure: CATARACT EXTRACTION PHACO AND INTRAOCULAR LENS PLACEMENT ([facility]);  Surgeon: KTonny Branch MD;  Location: [facility];  Service: Ophthalmology;  Laterality: Right;  CDE 7.84    COLONOSCOPY  07/2007   Dr JArnoldo Morale hyperplastic polyp 30cm   COLONOSCOPY N/A 08/12/2012   Procedure: COLONOSCOPY;  Surgeon: SDanie Binder MD;  Location: [facility];  Service: Endoscopy;  Laterality: N/A;  8:30   COLONOSCOPY N/A 11/11/2017   Dr. FOneida Alar multiple tubular adenomas, next surveillance in 2022.    COLONOSCOPY WITH PROPOFOL N/A 01/10/2021   Procedure: COLONOSCOPY WITH PROPOFOL;  Surgeon: CEloise Harman DO;  [facility];  Service: Endoscopy;  Laterality: N/A;  8:45 / ASA II   CORONARY ARTERY BYPASS GRAFT  06/2004   x2 performed for 40% left main stenosis, 80% proximal LAD stenosis of the first diagonal artery, and 50% left circumfles  artery priximal stenosis.LIMA to LAD and free radial artery to circumflex coronary artery   LEFT HEART CATHETERIZATION WITH CORONARY/GRAFT ANGIOGRAM  12/29/2012   Procedure: LEFT HEART CATHETERIZATION WITH CBeatrix Fetters  Surgeon: DLeonie Man MD;  [facility];  Service: Cardiovascular;;   POLYPECTOMY  11/11/2017   Procedure: POLYPECTOMY;  Surgeon: FDanie Binder MD;  [facility];  Service: Endoscopy;;  ascending colon, transverse, splenic flexure, sigmoid   POLYPECTOMY  01/10/2021   Procedure: POLYPECTOMY;  Surgeon: CEloise Harman DO;  [facility];  Service: Endoscopy;;   TONSILLECTOMY  1962   Social History   Socioeconomic History   Marital status: Married    Spouse name: Not on file   Number of children: 2   Years of education: 12   Highest education level: Not on file  Occupational History   Occupation: disabled mDealer  Tobacco Use   Smoking status: Never   Smokeless tobacco: Never  Vaping Use   Vaping Use: Never used  Substance and Sexual Activity   Alcohol use: Yes    Comment: social-2 beers/wk   Drug use: No   Sexual activity: Yes    Birth control/protection: None  Other Topics Concern   Not on file  Social History Narrative   Not on file   Social Determinants of Health  Financial Resource Strain: Low Risk  (05/19/2019)   Overall Financial Resource Strain (CARDIA)    Difficulty of Paying Living Expenses: Not hard at all  Food Insecurity: No Food Insecurity (05/19/2019)   Hunger Vital Sign    Worried About Running Out of Food in the Last Year: Never true    Ran Out of Food in the Last Year: Never true  Transportation Needs: No Transportation Needs (05/19/2019)   PRAPARE - Hydrologist (Medical): No    Lack of Transportation (Non-Medical): No  Physical Activity: Not on file  Stress: Not on file  Social Connections: Not on file   Outpatient Encounter Medications as of 12/20/2021   Medication Sig   hydrochlorothiazide (HYDRODIURIL) 25 MG tablet TAKE 1 TABLET(25 MG) BY MOUTH DAILY   lisinopril (ZESTRIL) 20 MG tablet TAKE 1 TABLET BY MOUTH TWICE DAILY   acetaminophen (TYLENOL) 325 MG tablet Take 650 mg by mouth as needed for moderate pain or headache.   amLODipine (NORVASC) 5 MG tablet Take 1 tablet (5 mg total) by mouth daily.   aspirin 81 MG EC tablet Take 81 mg by mouth daily.   blood glucose meter kit and supplies KIT Dispense based on patient and insurance preference. Use up to four times daily as directed. (FOR ICD-10 E11.65)   blood glucose meter kit and supplies 1 each by Other route 4 (four) times daily. Dispense based on patient and insurance preference. Use up to four times daily as directed. (FOR ICD-10 E10.9, E11.9).   carvedilol (COREG) 12.5 MG tablet TAKE 1 TABLET(12.5 MG) BY MOUTH TWICE DAILY WITH A MEAL   Cholecalciferol (VITAMIN D3) 25 MCG (1000 UT) CAPS Take 2 capsules (2,000 Units total) by mouth daily. (Patient taking differently: Take 1,000 Units by mouth daily.)   Continuous Blood Gluc Sensor (FREESTYLE LIBRE 14 DAY SENSOR) MISC Inject 1 each into the skin every 14 (fourteen) days. Use as directed to check blood glucose four times daily   Evolocumab (REPATHA) 140 MG/ML SOSY Inject into the skin every 14 (fourteen) days.   fenofibrate (TRICOR) 48 MG tablet TAKE 1 TABLET(48 MG) BY MOUTH DAILY   insulin glargine (LANTUS SOLOSTAR) 100 UNIT/ML Solostar Pen Inject 55 Units into the skin at bedtime.   insulin lispro (HUMALOG KWIKPEN) 100 UNIT/ML KwikPen Inject 10-16 Units into the skin 3 (three) times daily before meals.   metFORMIN (GLUCOPHAGE) 1000 MG tablet Take 1 tablet (1,000 mg total) by mouth 2 (two) times daily with a meal.   Multiple Vitamin (MULTIVITAMIN) capsule Take 1 capsule by mouth daily.   nitroGLYCERIN (NITROSTAT) 0.4 MG SL tablet PLACE 1 TABLET UNDER TONGUE IF NEEDED FOR CHEST PAIN.[name] OF 3 DOSES   ONETOUCH VERIO test strip USE TO CHECK  BLOOD SUGAR FOUR TIMES DAILY AS DIRECTED   pantoprazole (PROTONIX) 40 MG tablet Take 40 mg by mouth daily.   vitamin C (ASCORBIC ACID) 500 MG tablet Take 500 mg by mouth daily.   [DISCONTINUED] insulin glargine (LANTUS SOLOSTAR) 100 UNIT/ML Solostar Pen Inject 70 Units into the skin at bedtime.   No facility-administered encounter medications on file as of 12/20/2021.   ALLERGIES: Allergies  Allergen Reactions   Crestor [Rosuvastatin] Other (See Comments)    myalgia   VACCINATION STATUS:  There is no immunization history on file for this patient.  Diabetes He presents for his follow-up diabetic visit. He has type 2 diabetes mellitus. Onset time: He was diagnosed at approximate age of 51 years. His disease course  has been improving. Hypoglycemia symptoms include nervousness/anxiousness, sweats and tremors. Pertinent negatives for hypoglycemia include no confusion, pallor or seizures. There are no diabetic associated symptoms. Pertinent negatives for diabetes include no fatigue, no polydipsia, no polyphagia, no polyuria and no weakness. Hypoglycemia complications include nocturnal hypoglycemia. Symptoms are stable. Diabetic complications include heart disease, nephropathy and peripheral neuropathy. Risk factors for coronary artery disease include dyslipidemia, diabetes mellitus, hypertension, male sex, sedentary lifestyle and obesity. Current diabetic treatment includes intensive insulin program and oral agent (monotherapy). He is compliant with treatment most of the time. His weight is fluctuating minimally. He is following a generally healthy diet. When asked about meal planning, he reported none. He has had a previous visit with a dietitian. He participates in exercise intermittently. His home blood glucose trend is decreasing steadily. (He presents today with his logs showing improved glycemic profile overall.  His POCT A1c today is 7.3%, improving from last visit of 7.5%.  He does note severe  hypoglycemia in the 30s on several occasion during the night, despite having a snack before bed.  He notes he has also changed his eating habits along with his wife.) An ACE inhibitor/angiotensin II receptor blocker is being taken. He does not see a podiatrist.Eye exam is current.  Hyperlipidemia This is a chronic problem. The current episode started more than 1 year ago. The problem is uncontrolled. Recent lipid tests were reviewed and are variable. Exacerbating diseases include chronic renal disease, diabetes and obesity. Factors aggravating his hyperlipidemia include beta blockers and thiazides. Pertinent negatives include no myalgias. Current antihyperlipidemic treatment includes statins and fibric acid derivatives. The current treatment provides moderate improvement of lipids. There are no compliance problems.  Risk factors for coronary artery disease include diabetes mellitus, dyslipidemia, hypertension, male sex, a sedentary lifestyle, obesity and family history.     Review of systems  Constitutional: + Minimally fluctuating body weight,  current Body mass index is 29.6 kg/m. , no fatigue, no subjective hyperthermia, no subjective hypothermia Eyes: no blurry vision, no xerophthalmia ENT: no sore throat, no nodules palpated in throat, no dysphagia/odynophagia, no hoarseness Cardiovascular: no chest pain, no shortness of breath, no palpitations, no leg swelling Respiratory: no cough, no shortness of breath Gastrointestinal: no nausea/vomiting/diarrhea Musculoskeletal: no muscle/joint aches Skin: no rashes, no hyperemia Neurological: no tremors, no numbness, no tingling, no dizziness Psychiatric: no depression, no anxiety   Objective:    BP (!) 152/82   Pulse 78   Ht '5\' 7"'  (1.702 m)   Wt 189 lb (85.7 kg)   BMI 29.60 kg/m   Wt Readings from Last 3 Encounters:  12/20/21 189 lb (85.7 kg)  08/17/21 193 lb 6.4 oz (87.7 kg)  04/25/21 189 lb 12.8 oz (86.1 kg)    BP Readings from Last 3  Encounters:  12/20/21 (!) 152/82  08/17/21 (!) 146/74  04/25/21 (!) 152/60    Physical Exam- Limited  Constitutional:  Body mass index is 29.6 kg/m. , not in acute distress, normal state of mind Eyes:  EOMI, no exophthalmos Neck: Supple Cardiovascular: RRR, no murmurs, rubs, or gallops, no edema Respiratory: Adequate breathing efforts, no crackles, rales, rhonchi, or wheezing Musculoskeletal: no gross deformities, strength intact in all four extremities, no gross restriction of joint movements Skin:  no rashes, no hyperemia Neurological: no tremor with outstretched hands   Diabetic Foot Exam - Simple   No data filed     Results for orders placed or performed in visit on 12/20/21  HgB A1c  Result Value  Ref Range   Hemoglobin A1C     HbA1c POC (<> result, manual entry) 7.3 4.0 - 5.6 %   HbA1c, POC (prediabetic range)     HbA1c, POC (controlled diabetic range)     Diabetic Labs (most recent): Lab Results  Component Value Date   HGBA1C 7.3 12/20/2021   HGBA1C 7.5 (A) 08/17/2021   HGBA1C 6.9 (A) 04/18/2021   MICROALBUR 150 04/18/2021   Lipid Panel     Component Value Date/Time   CHOL 244 (H) 12/13/2021 0943   TRIG 388 (H) 12/13/2021 0943   HDL 35 (L) 12/13/2021 0943   CHOLHDL 7.0 (H) 12/13/2021 0943   CHOLHDL 7.0 12/29/2012 0438   VLDL UNABLE TO CALCULATE IF TRIGLYCERIDE OVER 400 mg/dL 12/29/2012 0438   LDLCALC 138 (H) 12/13/2021 0943       Latest Ref Rng & Units 12/13/2021    9:43 AM 04/12/2021   10:08 AM 08/05/2020    8:20 AM  CMP  Glucose 70 - 99 mg/dL 192  147  162   BUN 8 - 27 mg/dL '26  19  16   ' Creatinine 0.76 - 1.27 mg/dL 1.26  1.09  1.10   Sodium 134 - 144 mmol/L 138  136  140   Potassium 3.5 - 5.2 mmol/L 4.6  4.7  4.8   Chloride 96 - 106 mmol/L 101  99  102   CO2 20 - 29 mmol/L '18  24  21   ' Calcium 8.6 - 10.2 mg/dL 9.9  9.4  9.6   Total Protein 6.0 - 8.5 g/dL 6.4  6.9  6.8   Total Bilirubin 0.0 - 1.2 mg/dL 0.2  0.3  0.2   Alkaline Phos 44 - 121 IU/L  79  88  88   AST 0 - 40 IU/L '19  16  21   ' ALT 0 - 44 IU/L '19  18  28     ' Assessment & Plan:   1) Type 2 diabetes mellitus with vascular disease ([place])  His diabetes is complicated by coronary artery disease status post CABG in 2006 and recent stent placement in 2014.  He presents today with his logs showing improved glycemic profile overall.  His POCT A1c today is 7.3%, improving from last visit of 7.5%.  He does note severe hypoglycemia in the 30s on several occasion during the night, despite having a snack before bed.  He notes he has also changed his eating habits along with his wife.  -Recent labs reviewed.  POCT UM shows mild microalbuminuria.  He is advised to drink plenty of water throughout the day (something he admits he doesn't do enough of).  - [name] remains at a high risk for more acute and chronic complications of diabetes which include CAD, CVA, CKD, retinopathy, and neuropathy. These are all discussed in detail with the patient.  - Nutritional counseling repeated at each appointment due to patients tendency to fall back in to old habits.  - The patient admits there is a room for improvement in their diet and drink choices. -  Suggestion is made for the patient to avoid simple carbohydrates from their diet including Cakes, Sweet Desserts / Pastries, Ice Cream, Soda (diet and regular), Sweet Tea, Candies, Chips, Cookies, Sweet Pastries, Store Bought Juices, Alcohol in Excess of 1-2 drinks a day, Artificial Sweeteners, Coffee Creamer, and "Sugar-free" Products. This will help patient to have stable blood glucose profile and potentially avoid unintended weight gain.   - I encouraged the patient to  switch to unprocessed or minimally processed complex starch and increased protein intake (animal or plant source), fruits, and vegetables.   - Patient is advised to stick to a routine mealtimes to eat 3 meals a day and avoid unnecessary snacks (to snack only to correct  hypoglycemia).  - I have approached patient with the following individualized plan to manage diabetes and patient agrees.  -He is advised to lower his Lantus to 55 units SQ nightly and continue his Novolog 10-16 units TID with meals if glucose is above 90 and he is eating (Specific instructions on how to titrate insulin dosage based on glucose readings given to patient in writing), and Metformin 1000 mg po twice daily with meals.    -He is encouraged to continue to monitor glucose 4 times daily, before meals and before bed, and call the clinic if he has readings less than 70 or greater than 300 for 3 tests in a row.  He could benefit from CGM device.  Ordered Dexcom G7 through [employer].  2) Lipids/HPL:  His most recent lipid panel from 12/13/21 shows uncontrolled LDL at 138 and elevated triglycerides at 388 (worsening).  He is advised to continue Lipitor 20 mg po daily at bedtime and Fenofibrate 48 mg po daily.  Side effects and precautions discussed with him.  He is advised to avoid fried foods and butter.    3) Hypertension:  His blood pressure is still not controlled to target.  He is advised to continue Coreg 12.5 mg po twice daily, Diltiazem 120 mg po daily, HCTZ 25 mg po daily, and Lisinopril 20 mg po daily.    4) Weight management- His Body mass index is 29.6 kg/m.  He will benefit from moderate weight loss.  Exercise, carbs management discussed with him.  I advised patient to maintain close follow up with Dr. Sharilyn Sites for primary care needs.  He is  also advised to maintain close follow-up with his cardiologist.     I spent 40 minutes in the care of the patient today including review of labs from [place], Lipids, Thyroid Function, Hematology (current and previous including abstractions from other facilities); face-to-face time discussing  his blood glucose readings/logs, discussing hypoglycemia and hyperglycemia episodes and symptoms, medications doses, his options of short and long  term treatment based on the latest standards of care / guidelines;  discussion about incorporating lifestyle medicine;  and documenting the encounter. Risk reduction counseling performed per USPSTF guidelines to reduce obesity and cardiovascular risk factors.     Please refer to Patient Instructions for Blood Glucose Monitoring and Insulin/Medications Dosing Guide"  in media tab for additional information. Please  also refer to " Patient Self Inventory" in the Media  tab for reviewed elements of pertinent patient history.  Francis Hall participated in the discussions, expressed understanding, and voiced agreement with the above plans.  All questions were answered to his satisfaction. he is encouraged to contact clinic should he have any questions or concerns prior to his return visit.   Follow up plan: Return in about 4 months (around 04/22/2022) for Diabetes F/U with A1c in office, No previsit labs, Bring meter and logs.  Rayetta Pigg, [facility] [address] [place], [place] [address] Phone: [phone] Fax: [phone]  12/20/2021, 5:00 PM

## 2021-12-23 DIAGNOSIS — E1165 Type 2 diabetes mellitus with hyperglycemia: Secondary | ICD-10-CM | POA: Diagnosis not present

## 2021-12-28 ENCOUNTER — Other Ambulatory Visit: Payer: Self-pay | Admitting: Cardiovascular Disease

## 2021-12-28 DIAGNOSIS — H52223 Regular astigmatism, bilateral: Secondary | ICD-10-CM | POA: Diagnosis not present

## 2021-12-28 DIAGNOSIS — H524 Presbyopia: Secondary | ICD-10-CM | POA: Diagnosis not present

## 2022-01-23 DIAGNOSIS — E1165 Type 2 diabetes mellitus with hyperglycemia: Secondary | ICD-10-CM | POA: Diagnosis not present

## 2022-01-27 ENCOUNTER — Other Ambulatory Visit: Payer: Self-pay | Admitting: Cardiovascular Disease

## 2022-02-14 ENCOUNTER — Telehealth: Payer: Self-pay | Admitting: *Deleted

## 2022-02-14 ENCOUNTER — Encounter: Payer: Self-pay | Admitting: *Deleted

## 2022-02-14 NOTE — Patient Outreach (Signed)
  Care Coordination   Initial Visit Note   02/14/2022 Name: Francis Hall MRN: [id number] DOB: 06/25/1955  Francis Hall is a 66 y.o. year old male who sees Francis Sites, MD for primary care. I spoke with  Francis Hall by phone today.  What matters to the patients health and wellness today?  Ongoing self management of chronic medical conditions     Goals Addressed             This Visit's Progress    COMPLETED: Care Coordination Services (no follow-up required)       Care Coordination Interventions: Reviewed medications with patient and discussed affordability. No problems at this time.  Assessed social determinant of health barriers Discussed DM management and most recent A1C of 7.3. Pt sees endocrinologist every 4 months for management.  Assessed mobility and ability to perform ADLs Assessed family/Social support Provided patient/caregiver with verbal information on [place] [phone]) Encouraged patient to request a referral for [place] from PCP if services are needed in the future         SDOH assessments and interventions completed:  Yes  SDOH Interventions Today    Flowsheet Row Most Recent Value  SDOH Interventions   Housing Interventions Intervention Not Indicated  Utilities Interventions Intervention Not Indicated  Financial Strain Interventions Intervention Not Indicated        Care Coordination Interventions Activated:  Yes  Care Coordination Interventions:  Yes, provided   Follow up plan: No further intervention required.   Encounter Outcome:  Pt. Visit Completed   Chong Sicilian, BSN, RN-BC RN Care Coordinator [place] Direct Dial: [phone] Main #: [phone]

## 2022-02-23 DIAGNOSIS — E1165 Type 2 diabetes mellitus with hyperglycemia: Secondary | ICD-10-CM | POA: Diagnosis not present

## 2022-03-23 ENCOUNTER — Other Ambulatory Visit: Payer: Self-pay

## 2022-03-23 MED ORDER — ONETOUCH VERIO VI STRP: ORAL_STRIP | 2 refills | Status: AC

## 2022-03-25 DIAGNOSIS — E1165 Type 2 diabetes mellitus with hyperglycemia: Secondary | ICD-10-CM | POA: Diagnosis not present

## 2022-03-30 ENCOUNTER — Other Ambulatory Visit: Payer: Self-pay | Admitting: Cardiovascular Disease

## 2022-04-25 ENCOUNTER — Encounter: Payer: Self-pay | Admitting: Nurse Practitioner

## 2022-04-25 ENCOUNTER — Telehealth: Payer: Self-pay | Admitting: Cardiovascular Disease

## 2022-04-25 ENCOUNTER — Ambulatory Visit: Payer: Medicare Other | Admitting: Nurse Practitioner

## 2022-04-25 VITALS — BP 145/81 | HR 73 | Ht 67.0 in | Wt 194.4 lb

## 2022-04-25 DIAGNOSIS — E1159 Type 2 diabetes mellitus with other circulatory complications: Secondary | ICD-10-CM | POA: Diagnosis not present

## 2022-04-25 DIAGNOSIS — E782 Mixed hyperlipidemia: Secondary | ICD-10-CM

## 2022-04-25 DIAGNOSIS — I10 Essential (primary) hypertension: Secondary | ICD-10-CM

## 2022-04-25 DIAGNOSIS — E1165 Type 2 diabetes mellitus with hyperglycemia: Secondary | ICD-10-CM | POA: Diagnosis not present

## 2022-04-25 LAB — POCT GLYCOSYLATED HEMOGLOBIN (HGB A1C): Hemoglobin A1C: 7.3 % — AB (ref 4.0–5.6)

## 2022-04-25 MED ORDER — LANTUS SOLOSTAR 100 UNIT/ML ~~LOC~~ SOPN
55.0000 [IU] | PEN_INJECTOR | Freq: Every day | SUBCUTANEOUS | 3 refills | Status: DC
Start: 2022-04-25 — End: 2022-07-03

## 2022-04-25 MED ORDER — INSULIN LISPRO (1 UNIT DIAL) 100 UNIT/ML (KWIKPEN): 10.0000 [IU] | PEN_INJECTOR | Freq: Three times a day (TID) | SUBCUTANEOUS | 3 refills | Status: DC

## 2022-04-25 MED ORDER — METFORMIN HCL 1000 MG PO TABS
1000.0000 mg | ORAL_TABLET | Freq: Two times a day (BID) | ORAL | 3 refills | Status: DC
Start: 2022-04-25 — End: 2022-12-12

## 2022-04-25 MED ORDER — AMLODIPINE BESYLATE 5 MG PO TABS: 5.0000 mg | ORAL_TABLET | Freq: Every day | ORAL | 0 refills | Status: DC

## 2022-04-25 NOTE — Progress Notes (Signed)
04/25/2022   Endocrinology follow-up note   Subjective:    Patient ID: Francis Hall, male    DOB: 02/02/1956,    Past Medical History:  Diagnosis Date   Atrial fibrillation ([place])    CAD (coronary artery disease)    [place]   CHF (congestive heart failure) ([place]) 11/08/2010   2D Echo EF=>55%   Diabetes mellitus    insulin   Diverticulosis    Dyspnea    Family hx of colon cancer    GERD (gastroesophageal reflux disease)    HTN (hypertension)    Hyperlipemia    Hyperplastic colon polyp 07/16/07   30cm    Myocardial infarction ([place]) 12/2012   OSA (obstructive sleep apnea) 12/21/2007   sleep study perform REM 62mns REM and NREM was 95.0%, AHI of 12.31hr and RDI of 12.4hr   Past Surgical History:  Procedure Laterality Date   CARDIAC CATHETERIZATION  2006 2011   2 stents   CATARACT EXTRACTION W/PHACO Left 12/03/2013   Procedure: CATARACT EXTRACTION PHACO AND INTRAOCULAR LENS PLACEMENT ([place];  Surgeon: KTonny Branch MD;  Location: [facility];  Service: Ophthalmology;  Laterality: Left;  CDE:7.07   CATARACT EXTRACTION W/PHACO Right 12/14/2013   Procedure: CATARACT EXTRACTION PHACO AND INTRAOCULAR LENS PLACEMENT (IOC);  Surgeon: KTonny Branch MD;  Location: [facility];  Service: Ophthalmology;  Laterality: Right;  CDE 7.84    COLONOSCOPY  07/2007   Dr JArnoldo Morale hyperplastic polyp 30cm   COLONOSCOPY N/A 08/12/2012   Procedure: COLONOSCOPY;  Surgeon: SDanie Binder MD;  Location: [facility];  Service: Endoscopy;  Laterality: N/A;  8:30   COLONOSCOPY N/A 11/11/2017   Dr. FOneida Alar multiple tubular adenomas, next surveillance in 2022.    COLONOSCOPY WITH PROPOFOL N/A 01/10/2021   Procedure: COLONOSCOPY WITH PROPOFOL;  Surgeon: CEloise Harman DO;  Location: [facility];  Service: Endoscopy;  Laterality: N/A;  8:45 / ASA II   CORONARY ARTERY BYPASS GRAFT  06/2004   x2 performed for 40% left main stenosis, 80% proximal LAD stenosis of the first diagonal artery, and 50% left  circumfles artery priximal stenosis.LIMA to LAD and free radial artery to circumflex coronary artery   LEFT HEART CATHETERIZATION WITH CORONARY/GRAFT ANGIOGRAM  12/29/2012   Procedure: LEFT HEART CATHETERIZATION WITH CBeatrix Fetters  Surgeon: DLeonie Man MD;  [facility];  Service: Cardiovascular;;   POLYPECTOMY  11/11/2017   Procedure: POLYPECTOMY;  Surgeon: FDanie Binder MD;  [facility]: [facility];  Service: Endoscopy;;  ascending colon, transverse, splenic flexure, sigmoid   POLYPECTOMY  01/10/2021   Procedure: POLYPECTOMY;  Surgeon: CEloise Harman DO;  [facility]: [facility];  Service: Endoscopy;;   TONSILLECTOMY  1962   Social History   Socioeconomic History   Marital status: Married    Spouse name: Not on file   Number of children: 2   Years of education: 12   Highest education level: Not on file  Occupational History   Occupation: disabled mDealer  Tobacco Use   Smoking status: Never   Smokeless tobacco: Never  Vaping Use   Vaping Use: Never used  Substance and Sexual Activity   Alcohol use: Yes    Comment: social-2 beers/wk   Drug use: No   Sexual activity: Yes    Birth control/protection: None  Other Topics Concern   Not on file  Social History Narrative   Not on file   Social Determinants of Health  Financial Resource Strain: Low Risk  (02/14/2022)   Overall Financial Resource Strain (CARDIA)    Difficulty of Paying Living Expenses: Not hard at all  Food Insecurity: No Food Insecurity (05/19/2019)   Hunger Vital Sign    Worried About Running Out of Food in the Last Year: Never true    Ran Out of Food in the Last Year: Never true  Transportation Needs: No Transportation Needs (05/19/2019)   PRAPARE - Hydrologist (Medical): No    Lack of Transportation (Non-Medical): No  Physical Activity: Not on file  Stress: Not on file  Social Connections: Not on file   Outpatient Encounter Medications as of  04/25/2022  Medication Sig   acetaminophen (TYLENOL) 325 MG tablet Take 650 mg by mouth as needed for moderate pain or headache.   amLODipine (NORVASC) 5 MG tablet Take 1 tablet (5 mg total) by mouth daily.   aspirin 81 MG EC tablet Take 81 mg by mouth daily.   blood glucose meter kit and supplies KIT Dispense based on patient and insurance preference. Use up to four times daily as directed. (FOR ICD-10 E11.65)   blood glucose meter kit and supplies 1 each by Other route 4 (four) times daily. Dispense based on patient and insurance preference. Use up to four times daily as directed. (FOR ICD-10 E10.9, E11.9).   carvedilol (COREG) 12.5 MG tablet TAKE 1 TABLET(12.5 MG) BY MOUTH TWICE DAILY WITH A MEAL   Cholecalciferol (VITAMIN D3) 25 MCG (1000 UT) CAPS Take 2 capsules (2,000 Units total) by mouth daily. (Patient taking differently: Take 1,000 Units by mouth daily.)   Continuous Blood Gluc Sensor (FREESTYLE LIBRE 14 DAY SENSOR) MISC Inject 1 each into the skin every 14 (fourteen) days. Use as directed to check blood glucose four times daily   Evolocumab (REPATHA) 140 MG/ML SOSY Inject into the skin every 14 (fourteen) days.   fenofibrate (TRICOR) 48 MG tablet TAKE 1 TABLET(48 MG) BY MOUTH DAILY   glucose blood (ONETOUCH VERIO) test strip USE TO CHECK BLOOD SUGAR FOUR TIMES DAILY AS DIRECTED   hydrochlorothiazide (HYDRODIURIL) 25 MG tablet TAKE 1 TABLET(25 MG) BY MOUTH DAILY   lisinopril (ZESTRIL) 20 MG tablet TAKE 1 TABLET BY MOUTH TWICE DAILY   Multiple Vitamin (MULTIVITAMIN) capsule Take 1 capsule by mouth daily.   nitroGLYCERIN (NITROSTAT) 0.4 MG SL tablet PLACE 1 TABLET UNDER TONGUE IF NEEDED FOR CHEST PAIN.[name] OF 3 DOSES   pantoprazole (PROTONIX) 40 MG tablet Take 40 mg by mouth daily.   vitamin C (ASCORBIC ACID) 500 MG tablet Take 500 mg by mouth daily.   [DISCONTINUED] insulin glargine (LANTUS SOLOSTAR) 100 UNIT/ML Solostar Pen Inject 55 Units into the skin at bedtime.   [DISCONTINUED]  insulin lispro (HUMALOG KWIKPEN) 100 UNIT/ML KwikPen Inject 10-16 Units into the skin 3 (three) times daily before meals.   [DISCONTINUED] metFORMIN (GLUCOPHAGE) 1000 MG tablet Take 1 tablet (1,000 mg total) by mouth 2 (two) times daily with a meal.   insulin glargine (LANTUS SOLOSTAR) 100 UNIT/ML Solostar Pen Inject 55 Units into the skin at bedtime.   insulin lispro (HUMALOG KWIKPEN) 100 UNIT/ML KwikPen Inject 10-16 Units into the skin 3 (three) times daily before meals.   metFORMIN (GLUCOPHAGE) 1000 MG tablet Take 1 tablet (1,000 mg total) by mouth 2 (two) times daily with a meal.   [DISCONTINUED] amLODipine (NORVASC) 5 MG tablet Take 1 tablet (5 mg total) by mouth daily.   No facility-administered encounter medications on file as  of 04/25/2022.   ALLERGIES: Allergies  Allergen Reactions   Crestor [Rosuvastatin] Other (See Comments)    myalgia   VACCINATION STATUS:  There is no immunization history on file for this patient.  Diabetes He presents for his follow-up diabetic visit. He has type 2 diabetes mellitus. Onset time: He was diagnosed at approximate age of 37 years. His disease course has been stable. Hypoglycemia symptoms include nervousness/anxiousness, sweats and tremors. Pertinent negatives for hypoglycemia include no confusion, pallor or seizures. There are no diabetic associated symptoms. Pertinent negatives for diabetes include no fatigue, no polydipsia, no polyphagia, no polyuria and no weakness. Hypoglycemia complications include nocturnal hypoglycemia. Symptoms are stable. Diabetic complications include heart disease, nephropathy and peripheral neuropathy. Risk factors for coronary artery disease include dyslipidemia, diabetes mellitus, hypertension, male sex, sedentary lifestyle and obesity. Current diabetic treatment includes intensive insulin program and oral agent (monotherapy). He is compliant with treatment most of the time. His weight is fluctuating minimally. He is  following a generally healthy diet. When asked about meal planning, he reported none. He has had a previous visit with a dietitian. He participates in exercise intermittently. His home blood glucose trend is fluctuating minimally. His overall blood glucose range is 140-180 mg/dl. (He presents today with his logs, no meter, showing above target fasting and at goal postprandial readings.  His POCT A1c today is 7.3%, unchanged from previous visit.  He has not been using his CGM recently, says the sensors were falling off during the summer months due to his sweat.  He does have occasional hypoglycemia noted.) An ACE inhibitor/angiotensin II receptor blocker is being taken. He does not see a podiatrist.Eye exam is current.  Hyperlipidemia This is a chronic problem. The current episode started more than 1 year ago. The problem is uncontrolled. Recent lipid tests were reviewed and are variable. Exacerbating diseases include chronic renal disease, diabetes and obesity. Factors aggravating his hyperlipidemia include beta blockers and thiazides. Pertinent negatives include no myalgias. Current antihyperlipidemic treatment includes statins and fibric acid derivatives. The current treatment provides moderate improvement of lipids. There are no compliance problems.  Risk factors for coronary artery disease include diabetes mellitus, dyslipidemia, hypertension, male sex, a sedentary lifestyle, obesity and family history.     Review of systems  Constitutional: + Minimally fluctuating body weight,  current Body mass index is 30.45 kg/m. , no fatigue, no subjective hyperthermia, no subjective hypothermia Eyes: no blurry vision, no xerophthalmia ENT: no sore throat, no nodules palpated in throat, no dysphagia/odynophagia, no hoarseness Cardiovascular: no chest pain, no shortness of breath, no palpitations, no leg swelling Respiratory: no cough, no shortness of breath Gastrointestinal: no  nausea/vomiting/diarrhea Musculoskeletal: no muscle/joint aches Skin: no rashes, no hyperemia Neurological: no tremors, no numbness, no tingling, no dizziness Psychiatric: no depression, no anxiety   Objective:    BP (!) 145/81 (BP Location: Left Arm, Patient Position: Sitting, Cuff Size: Large)   Pulse 73   Ht _0  (1.702 m)   Wt 194 lb 6.4 oz (88.2 kg)   BMI 30.45 kg/m   Wt Readings from Last 3 Encounters:  04/25/22 194 lb 6.4 oz (88.2 kg)  12/20/21 189 lb (85.7 kg)  08/17/21 193 lb 6.4 oz (87.7 kg)    BP Readings from Last 3 Encounters:  04/25/22 (!) 145/81  12/20/21 (!) 152/82  08/17/21 (!) 146/74     Physical Exam- Limited  Constitutional:  Body mass index is 30.45 kg/m. , not in acute distress, normal state of mind Eyes:  EOMI, no exophthalmos Neck: Supple Cardiovascular: RRR, no murmurs, rubs, or gallops, no edema Respiratory: Adequate breathing efforts, no crackles, rales, rhonchi, or wheezing Musculoskeletal: no gross deformities, strength intact in all four extremities, no gross restriction of joint movements Skin:  no rashes, no hyperemia Neurological: no tremor with outstretched hands   Diabetic Foot Exam - Simple   No data filed     Results for orders placed or performed in visit on 04/25/22  HgB A1c  Result Value Ref Range   Hemoglobin A1C 7.3 (A) 4.0 - 5.6 %   HbA1c POC (<> result, manual entry)     HbA1c, POC (prediabetic range)     HbA1c, POC (controlled diabetic range)     Diabetic Labs (most recent): Lab Results  Component Value Date   HGBA1C 7.3 (A) 04/25/2022   HGBA1C 7.3 12/20/2021   HGBA1C 7.5 (A) 08/17/2021   MICROALBUR 150 04/18/2021   Lipid Panel     Component Value Date/Time   CHOL 244 (H) 12/13/2021 0943   TRIG 388 (H) 12/13/2021 0943   HDL 35 (L) 12/13/2021 0943   CHOLHDL 7.0 (H) 12/13/2021 0943   CHOLHDL 7.0 12/29/2012 0438   VLDL UNABLE TO CALCULATE IF TRIGLYCERIDE OVER 400 mg/dL 12/29/2012 0438   LDLCALC 138 (H)  12/13/2021 0943       Latest Ref Rng & Units 12/13/2021    9:43 AM 04/12/2021   10:08 AM 08/05/2020    8:20 AM  CMP  Glucose 70 - 99 mg/dL 192  147  162   BUN 8 - 27 mg/dL _0 Creatinine 0.76 - 1.27 mg/dL 1.26  1.09  1.10   Sodium 134 - 144 mmol/L 138  136  140   Potassium 3.5 - 5.2 mmol/L 4.6  4.7  4.8   Chloride 96 - 106 mmol/L 101  99  102   CO2 20 - 29 mmol/L _1 Calcium 8.6 - 10.2 mg/dL 9.9  9.4  9.6   Total Protein 6.0 - 8.5 g/dL 6.4  6.9  6.8   Total Bilirubin 0.0 - 1.2 mg/dL 0.2  0.3  0.2   Alkaline Phos 44 - 121 IU/L 79  88  88   AST 0 - 40 IU/L _2 ALT 0 - 44 IU/L _3 Assessment & Plan:   1) Type 2 diabetes mellitus with vascular disease ([place])  His diabetes is complicated by coronary artery disease status post CABG in 2006 and recent stent placement in 2014.  He presents today with his logs, no meter, showing above target fasting and at goal postprandial readings.  His POCT A1c today is 7.3%, unchanged from previous visit.  He has not been using his CGM recently, says the sensors were falling off during the summer months due to his sweat.  He does have occasional hypoglycemia noted.  -Recent labs reviewed.  POCT UM shows mild microalbuminuria.  He is advised to drink plenty of water throughout the day (something he admits he doesn't do enough of).  - Francis Hall remains at a high risk for more acute and chronic complications of diabetes which include CAD, CVA, CKD, retinopathy, and neuropathy. These are all discussed in detail with the patient.  - Nutritional counseling repeated at each appointment due to patients tendency to fall back in to old habits.  - The patient admits there is a room  for improvement in their diet and drink choices. -  Suggestion is made for the patient to avoid simple carbohydrates from their diet including Cakes, Sweet Desserts / Pastries, Ice Cream, Soda (diet and regular), Sweet Tea, Candies, Chips, Cookies,  Sweet Pastries, Store Bought Juices, Alcohol in Excess of 1-2 drinks a day, Artificial Sweeteners, Coffee Creamer, and "Sugar-free" Products. This will help patient to have stable blood glucose profile and potentially avoid unintended weight gain.   - I encouraged the patient to switch to unprocessed or minimally processed complex starch and increased protein intake (animal or plant source), fruits, and vegetables.   - Patient is advised to stick to a routine mealtimes to eat 3 meals a day and avoid unnecessary snacks (to snack only to correct hypoglycemia).  - I have approached patient with the following individualized plan to manage diabetes and patient agrees.  -He is advised to continue his Lantus 60 units SQ nightly and continue his Novolog 10-16 units TID with meals if glucose is above 90 and he is eating (Specific instructions on how to titrate insulin dosage based on glucose readings given to patient in writing), and Metformin 1000 mg po twice daily with meals.    -He is encouraged to continue to monitor glucose 4 times daily, before meals and before bed, and call the clinic if he has readings less than 70 or greater than 300 for 3 tests in a row.  He is advised to restart using his CGM device.  2) Lipids/HPL:  His most recent lipid panel from 12/13/21 shows uncontrolled LDL at 138 and elevated triglycerides at 388 (worsening).  He is advised to continue Lipitor 20 mg po daily at bedtime and Fenofibrate 48 mg po daily.  Side effects and precautions discussed with him.  He is advised to avoid fried foods and butter.    3) Hypertension:  His blood pressure is still not controlled to target.  He is advised to continue Coreg 12.5 mg po twice daily, Diltiazem 120 mg po daily, HCTZ 25 mg po daily, and Lisinopril 20 mg po daily.    4) Weight management- His Body mass index is 30.45 kg/m.  He will benefit from moderate weight loss.  Exercise, carbs management discussed with him.  I advised  patient to maintain close follow up with Dr. Sharilyn Sites for primary care needs.  He is  also advised to maintain close follow-up with his cardiologist.      I spent 43 minutes in the care of the patient today including review of labs from [place], Lipids, Thyroid Function, Hematology (current and previous including abstractions from other facilities); face-to-face time discussing  his blood glucose readings/logs, discussing hypoglycemia and hyperglycemia episodes and symptoms, medications doses, his options of short and long term treatment based on the latest standards of care / guidelines;  discussion about incorporating lifestyle medicine;  and documenting the encounter. Risk reduction counseling performed per USPSTF guidelines to reduce obesity and cardiovascular risk factors.     Please refer to Patient Instructions for Blood Glucose Monitoring and Insulin/Medications Dosing Guide"  in media tab for additional information. Please  also refer to " Patient Self Inventory" in the Media  tab for reviewed elements of pertinent patient history.  Francis Hall participated in the discussions, expressed understanding, and voiced agreement with the above plans.  All questions were answered to his satisfaction. he is encouraged to contact clinic should he have any questions or concerns prior to his return visit.   Follow  up plan: Return in about 4 months (around 08/24/2022) for Diabetes F/U with A1c in office, No previsit labs, Bring meter and logs.  Francis Hall, [facility] [address] [place],  [address] Phone: [phone] Fax: [phone]  04/25/2022, 5:03 PM

## 2022-04-25 NOTE — Telephone Encounter (Signed)
*  STAT* If patient is at the pharmacy, call can be transferred to refill team.   1. Which medications need to be refilled? (please list name of each medication and dose if known) amLODipine (NORVASC) 5 MG tablet   2. Which pharmacy/location (including street and city if local pharmacy) is medication to be sent to?  [id number] - [place], [address]    3. Do they need a 30 day or 90 day supply?  90 day

## 2022-04-25 NOTE — Telephone Encounter (Signed)
Pt's medication was sent to pt's pharmacy as requested. Confirmation received.  °

## 2022-05-03 DIAGNOSIS — Z0001 Encounter for general adult medical examination with abnormal findings: Secondary | ICD-10-CM | POA: Diagnosis not present

## 2022-05-03 DIAGNOSIS — Z1331 Encounter for screening for depression: Secondary | ICD-10-CM | POA: Diagnosis not present

## 2022-05-03 DIAGNOSIS — E785 Hyperlipidemia, unspecified: Secondary | ICD-10-CM | POA: Diagnosis not present

## 2022-05-03 DIAGNOSIS — E109 Type 1 diabetes mellitus without complications: Secondary | ICD-10-CM | POA: Diagnosis not present

## 2022-05-03 DIAGNOSIS — Z6829 Body mass index (BMI) 29.0-29.9, adult: Secondary | ICD-10-CM | POA: Diagnosis not present

## 2022-05-23 ENCOUNTER — Telehealth: Payer: Self-pay | Admitting: Cardiovascular Disease

## 2022-05-23 MED ORDER — LISINOPRIL 20 MG PO TABS: 20.0000 mg | ORAL_TABLET | Freq: Two times a day (BID) | ORAL | 1 refills | Status: DC

## 2022-05-23 MED ORDER — HYDROCHLOROTHIAZIDE 25 MG PO TABS: ORAL_TABLET | ORAL | 3 refills | Status: DC

## 2022-05-23 MED ORDER — AMLODIPINE BESYLATE 5 MG PO TABS: 5.0000 mg | ORAL_TABLET | Freq: Every day | ORAL | 11 refills | Status: DC

## 2022-05-23 NOTE — Telephone Encounter (Signed)
 *  STAT* If patient is at the pharmacy, call can be transferred to refill team.   1. Which medications need to be refilled? (please list name of each medication and dose if known) amLODipine (NORVASC) 5 MG tablet   hydrochlorothiazide (HYDRODIURIL) 25 MG tablet    lisinopril (ZESTRIL) 20 MG tablet   2. Which pharmacy/location (including street and city if local pharmacy) is medication to be sent to? [id number] - [place], [place] [address]   3. Do they need a 30 day or 90 day supply? 90 days  Pt is running our of meds, also, they are requesting for 90 days supply since pt appt is not until March 2024

## 2022-05-25 ENCOUNTER — Other Ambulatory Visit: Payer: Self-pay | Admitting: Cardiovascular Disease

## 2022-05-25 ENCOUNTER — Other Ambulatory Visit: Payer: Self-pay

## 2022-05-25 DIAGNOSIS — E1165 Type 2 diabetes mellitus with hyperglycemia: Secondary | ICD-10-CM | POA: Diagnosis not present

## 2022-05-25 MED ORDER — HYDROCHLOROTHIAZIDE 25 MG PO TABS: ORAL_TABLET | ORAL | 0 refills | Status: DC

## 2022-05-27 ENCOUNTER — Other Ambulatory Visit: Payer: Self-pay | Admitting: Cardiovascular Disease

## 2022-06-25 DIAGNOSIS — E1165 Type 2 diabetes mellitus with hyperglycemia: Secondary | ICD-10-CM | POA: Diagnosis not present

## 2022-06-30 ENCOUNTER — Other Ambulatory Visit: Payer: Self-pay | Admitting: Cardiovascular Disease

## 2022-06-30 ENCOUNTER — Other Ambulatory Visit: Payer: Self-pay | Admitting: Nurse Practitioner

## 2022-06-30 DIAGNOSIS — E1159 Type 2 diabetes mellitus with other circulatory complications: Secondary | ICD-10-CM

## 2022-07-26 DIAGNOSIS — E1165 Type 2 diabetes mellitus with hyperglycemia: Secondary | ICD-10-CM | POA: Diagnosis not present

## 2022-08-01 ENCOUNTER — Other Ambulatory Visit: Payer: Self-pay | Admitting: Cardiovascular Disease

## 2022-08-03 ENCOUNTER — Other Ambulatory Visit: Payer: Self-pay | Admitting: *Deleted

## 2022-08-21 ENCOUNTER — Ambulatory Visit: Payer: Medicare Other | Attending: Cardiovascular Disease | Admitting: Cardiovascular Disease

## 2022-08-21 ENCOUNTER — Encounter: Payer: Self-pay | Admitting: Cardiovascular Disease

## 2022-08-21 VITALS — Ht 67.0 in | Wt 199.2 lb

## 2022-08-21 DIAGNOSIS — E669 Obesity, unspecified: Secondary | ICD-10-CM

## 2022-08-21 DIAGNOSIS — G720 Drug-induced myopathy: Secondary | ICD-10-CM

## 2022-08-21 DIAGNOSIS — T466X5A Adverse effect of antihyperlipidemic and antiarteriosclerotic drugs, initial encounter: Secondary | ICD-10-CM

## 2022-08-21 DIAGNOSIS — E782 Mixed hyperlipidemia: Secondary | ICD-10-CM

## 2022-08-21 DIAGNOSIS — I25810 Atherosclerosis of coronary artery bypass graft(s) without angina pectoris: Secondary | ICD-10-CM | POA: Diagnosis not present

## 2022-08-21 DIAGNOSIS — E118 Type 2 diabetes mellitus with unspecified complications: Secondary | ICD-10-CM

## 2022-08-21 DIAGNOSIS — I5032 Chronic diastolic (congestive) heart failure: Secondary | ICD-10-CM | POA: Diagnosis not present

## 2022-08-21 DIAGNOSIS — I10 Essential (primary) hypertension: Secondary | ICD-10-CM | POA: Diagnosis not present

## 2022-08-21 DIAGNOSIS — Z794 Long term (current) use of insulin: Secondary | ICD-10-CM

## 2022-08-21 MED ORDER — AMLODIPINE BESYLATE 10 MG PO TABS: 10.0000 mg | ORAL_TABLET | Freq: Every day | ORAL | 3 refills | Status: DC

## 2022-08-21 MED ORDER — REPATHA 140 MG/ML ~~LOC~~ SOSY: 140.0000 mg | PREFILLED_SYRINGE | SUBCUTANEOUS | 3 refills | Status: AC

## 2022-08-21 NOTE — Progress Notes (Signed)
Cardiology Office Note    Date:  08/26/2022   ID:  Francis Hall, Francis Hall 02-27-1956, MRN [id number]  PCP:  Sharilyn Sites, MD  Cardiologist:   Sanda Klein, MD   Chief complaint: CAD  History of Present Illness:  Francis Hall is a 67 y.o. male with coronary artery disease and previous bypass surgery (2006 LIMA-LAD, free radial to circumflex), subsequent presentation with non-STEMI and PCI to the native RCA in [id number] ), diastolic heart failure, insulin-requiring diabetes mellitus, hypertension, mixed hyperlipidemia with severe hypertriglyceridemia, statin myopathy.  He is known to be a "clopidogrel nonresponder".  He is feeling very well.  He denies any problems with chest pain or shortness of breath at rest or with activity.  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  He has not had dizziness, palpitations, syncope.  He denies orthopnea, PND or lower extremity edema.  Denies claudication or new focal neurological complaints.  Unfortunately he remains mildly obese with a BMI of about 31.  He requires insulin for his diabetes which is fairly well-controlled with an A1c of 7.3% last November.  He has chronically elevated triglycerides and low HDL.  LDL which was 126 last December.  Recheck today and plan to start PCSK9 inhibitor.  His blood pressure is staying high, although when he checks it at home it is better than what we got today.  It is typically in the high 130s-140s/70s-80s.  Darel had multivessel bypass surgery in 2006 (LIMA to LAD, free radial to circumflex) and in 2014 presented with a small non-STEMI and required percutaneous revascularization (proximal posterolateral artery 2.25 x 16 mm Promus and mid right coronary artery 2.5 x 38 mm Promus and balloon angioplasty of the ostium of the PDA). He is a "Plavix nonresponder", on ticagrelor chronically.  Past Medical History:  Diagnosis Date   Atrial  fibrillation ([place])    CAD (coronary artery disease)    [facility]   CHF (congestive heart failure) ([place]) 11/08/2010   2D Echo EF=>55%   Diabetes mellitus    insulin   Diverticulosis    Dyspnea    Family hx of colon cancer    GERD (gastroesophageal reflux disease)    HTN (hypertension)    Hyperlipemia    Hyperplastic colon polyp 07/16/07   30cm    Myocardial infarction ([place]) 12/2012   OSA (obstructive sleep apnea) 12/21/2007   sleep study perform REM 81mins REM and NREM was 95.0%, AHI of 12.31hr and RDI of 12.4hr    Past Surgical History:  Procedure Laterality Date   CARDIAC CATHETERIZATION  2006 2011   2 stents   CATARACT EXTRACTION W/PHACO Left 12/03/2013   Procedure: CATARACT EXTRACTION PHACO AND INTRAOCULAR LENS PLACEMENT ([place]);  Surgeon: Tonny Branch, MD;  Location: [facility];  Service: Ophthalmology;  Laterality: Left;  CDE:7.07   CATARACT EXTRACTION W/PHACO Right 12/14/2013   Procedure: CATARACT EXTRACTION PHACO AND INTRAOCULAR LENS PLACEMENT ([facility]);  Surgeon: Tonny Branch, MD;  Location: [facility];  Service: Ophthalmology;  Laterality: Right;  CDE 7.84    COLONOSCOPY  07/2007   Dr Arnoldo Morale: hyperplastic polyp 30cm   COLONOSCOPY N/A 08/12/2012   Procedure: COLONOSCOPY;  Surgeon: Danie Binder, MD;  Location: [facility];  Service: Endoscopy;  Laterality: N/A;  8:30   COLONOSCOPY N/A 11/11/2017   Dr. Oneida Alar: multiple tubular adenomas, next surveillance in 2022.    COLONOSCOPY WITH PROPOFOL N/A  01/10/2021   Procedure: COLONOSCOPY WITH PROPOFOL;  Surgeon: Eloise Harman, DO;  Location: [facility];  Service: Endoscopy;  Laterality: N/A;  8:45 / ASA II   CORONARY ARTERY BYPASS GRAFT  06/2004   x2 performed for 40% left main stenosis, 80% proximal LAD stenosis of the first diagonal artery, and 50% left circumfles artery priximal stenosis.LIMA to LAD and free radial artery to circumflex coronary artery   LEFT HEART CATHETERIZATION WITH CORONARY/GRAFT ANGIOGRAM  12/29/2012   Procedure: LEFT  HEART CATHETERIZATION WITH Beatrix Fetters;  Surgeon: Leonie Man, MD;  Location: [facility] CATH LAB;  Service: Cardiovascular;;   POLYPECTOMY  11/11/2017   Procedure: POLYPECTOMY;  Surgeon: Danie Binder, MD;  Location: [facility];  Service: Endoscopy;;  ascending colon, transverse, splenic flexure, sigmoid   POLYPECTOMY  01/10/2021   Procedure: POLYPECTOMY;  Surgeon: Eloise Harman, DO;  Location: [facility];  Service: Endoscopy;;   TONSILLECTOMY  1962    Current Medications: Outpatient Medications Prior to Visit  Medication Sig Dispense Refill   acetaminophen (TYLENOL) 325 MG tablet Take 650 mg by mouth as needed for moderate pain or headache.     aspirin 81 MG EC tablet Take 81 mg by mouth daily.     blood glucose meter kit and supplies KIT Dispense based on patient and insurance preference. Use up to four times daily as directed. (FOR ICD-10 E11.65) 1 each 5   blood glucose meter kit and supplies 1 each by Other route 4 (four) times daily. Dispense based on patient and insurance preference. Use up to four times daily as directed. (FOR ICD-10 E10.9, E11.9). 1 each 0   carvedilol (COREG) 12.5 MG tablet TAKE 1 TABLET(12.5 MG) BY MOUTH TWICE DAILY WITH A MEAL 180 tablet 3   Cholecalciferol (VITAMIN D3) 25 MCG (1000 UT) CAPS Take 2 capsules (2,000 Units total) by mouth daily. (Patient taking differently: Take 1,000 Units by mouth daily.) 60 capsule 3   Continuous Blood Gluc Sensor (FREESTYLE LIBRE 14 DAY SENSOR) MISC Inject 1 each into the skin every 14 (fourteen) days. Use as directed to check blood glucose four times daily 2 each 3   fenofibrate (TRICOR) 48 MG tablet TAKE 1 TABLET(48 MG) BY MOUTH DAILY 30 tablet 1   glucose blood (ONETOUCH VERIO) test strip USE TO CHECK BLOOD SUGAR FOUR TIMES DAILY AS DIRECTED 300 strip 2   hydrochlorothiazide (HYDRODIURIL) 25 MG tablet TAKE 1 TABLET BY MOUTH DAILY 90 tablet 3   insulin glargine (LANTUS SOLOSTAR) 100 UNIT/ML Solostar Pen Inject  60 Units into the skin at bedtime. 60 mL 0   insulin lispro (HUMALOG KWIKPEN) 100 UNIT/ML KwikPen Inject 10-16 Units into the skin 3 (three) times daily before meals. 45 mL 3   lisinopril (ZESTRIL) 20 MG tablet Take 1 tablet (20 mg total) by mouth 2 (two) times daily. 180 tablet 1   metFORMIN (GLUCOPHAGE) 1000 MG tablet Take 1 tablet (1,000 mg total) by mouth 2 (two) times daily with a meal. 180 tablet 3   Multiple Vitamin (MULTIVITAMIN) capsule Take 1 capsule by mouth daily.     nitroGLYCERIN (NITROSTAT) 0.4 MG SL tablet PLACE 1 TABLET UNDER TONGUE IF NEEDED FOR CHEST PAIN.[name] OF 3 DOSES 25 tablet 6   pantoprazole (PROTONIX) 40 MG tablet Take 40 mg by mouth daily.     vitamin C (ASCORBIC ACID) 500 MG tablet Take 500 mg by mouth daily.     amLODipine (NORVASC) 5 MG tablet Take 1 tablet (5 mg  total) by mouth daily. 90 tablet 0   Evolocumab (REPATHA) 140 MG/ML SOSY Inject into the skin every 14 (fourteen) days.     No facility-administered medications prior to visit.     Allergies:   Crestor [rosuvastatin]   Social History   Socioeconomic History   Marital status: Married    Spouse name: Not on file   Number of children: 2   Years of education: 12   Highest education level: Not on file  Occupational History   Occupation: disabled Dealer   Tobacco Use   Smoking status: Never   Smokeless tobacco: Never  Vaping Use   Vaping Use: Never used  Substance and Sexual Activity   Alcohol use: Yes    Comment: social-2 beers/wk   Drug use: No   Sexual activity: Yes    Birth control/protection: None  Other Topics Concern   Not on file  Social History Narrative   Not on file   Social Determinants of Health   Financial Resource Strain: Low Risk  (02/14/2022)   Overall Financial Resource Strain (CARDIA)    Difficulty of Paying Living Expenses: Not hard at all  Food Insecurity: No Food Insecurity (05/19/2019)   Hunger Vital Sign    Worried About Running Out of Food in the Last Year:  Never true    [place] in the Last Year: Never true  Transportation Needs: No Transportation Needs (05/19/2019)   PRAPARE - Hydrologist (Medical): No    Lack of Transportation (Non-Medical): No  Physical Activity: Not on file  Stress: Not on file  Social Connections: Not on file     Family History:  The patient's family history includes Arthritis in an other family member; Colon cancer in his father; Coronary artery disease (age of onset: 64) in his father; Diabetes in an other family member; Heart disease in an other family member; Lung cancer in his father.   ROS:   Please see the history of present illness.    All other systems are reviewed and are negative.   PHYSICAL EXAM:   VS:  Ht 5\' 7"  (1.702 m)   Wt 199 lb 3.2 oz (90.4 kg)   SpO2 94%   BMI 31.20 kg/m    BP 168/79, recheck 142/70 Heart rate 81  General: Alert, oriented x3, no distress, mildly obese Head: no evidence of trauma, PERRL, EOMI, no exophtalmos or lid lag, no myxedema, no xanthelasma; normal ears, nose and oropharynx Neck: normal jugular venous pulsations and no hepatojugular reflux; brisk carotid pulses without delay and no carotid bruits Chest: clear to auscultation, no signs of consolidation by percussion or palpation, normal fremitus, symmetrical and full respiratory excursions Cardiovascular: normal position and quality of the apical impulse, regular rhythm, normal first and second heart sounds, no murmurs, rubs or gallops Abdomen: no tenderness or distention, no masses by palpation, no abnormal pulsatility or arterial bruits, normal bowel sounds, no hepatosplenomegaly Extremities: no clubbing, cyanosis or edema; 2+ radial, ulnar and brachial pulses bilaterally; 2+ right femoral, posterior tibial and dorsalis pedis pulses; 2+ left femoral, posterior tibial and dorsalis pedis pulses; no subclavian or femoral bruits Neurological: grossly nonfocal Psych: Normal mood and  affect    Wt Readings from Last 3 Encounters:  08/21/22 199 lb 3.2 oz (90.4 kg)  04/25/22 194 lb 6.4 oz (88.2 kg)  12/20/21 189 lb (85.7 kg)      Studies/Labs Reviewed:   EKG:  EKG is ordered today.  Personally  reviewed, shows sinus rhythm with a single PVC and is otherwise normal.  QTc 427 ms.  Recent Labs: 12/13/2021: ALT 19; BUN 26; Creatinine, Ser 1.26; Potassium 4.6; Sodium 138; TSH 3.150   Lipid Panel    Component Value Date/Time   CHOL 244 (H) 12/13/2021 0943   TRIG 388 (H) 12/13/2021 0943   HDL 35 (L) 12/13/2021 0943   CHOLHDL 7.0 (H) 12/13/2021 0943   CHOLHDL 7.0 12/29/2012 0438   VLDL UNABLE TO CALCULATE IF TRIGLYCERIDE OVER 400 mg/dL 12/29/2012 0438   LDLCALC 138 (H) 12/13/2021 0943   11/27/2018 Chol 179, HDL 27, TG 408 Creat 0.97 12/24/2018 A1c 7.3% 04/14/2020  hemoglobin A1c 7.2%  03/07/2021 Total cholesterol 228, HDL 41, triglycerides 252 Hemoglobin 12.0  04/12/2021 Creatinine 1.09, potassium 4.7, ALT 18  04/18/2021 hemoglobin A1c 6.9%  Additional studies/ records that were reviewed today include:    ASSESSMENT:    1. Chronic diastolic heart failure ([place])   2. Essential hypertension   3. Coronary artery disease involving coronary bypass graft of native heart without angina pectoris   4. Mixed hyperlipidemia   5. Type 2 diabetes mellitus with complication, with long-term current use of insulin ([place])   6. Mild obesity        PLAN:  In order of problems listed above:  CHF: Clinically euvolemic NYHA functional class I.  Not on SGLT2 inhibitor: ? due to cost issues (briefly took [place] in 2018-2019).   CAD s/p CABG 2006, PCI-RCA 2014: Currently asymptomatic.  Known to be a clopidogrel nonresponder. HTN: Target systolic blood pressure less than 130.  Previously poorly tolerant of higher doses of carvedilol to fatigue.  Increase amlodipine to 10 mg daily. HLP: Intolerant to both rosuvastatin and atorvastatin due to myopathy.  Starting on  Repatha, need to recheck his lipid profile.  On fenofibrate for his triglycerides. DM: A1c is not quite in target range, less than 7%.   Obesity: He is lost some ground in his effort to lose weight and has gained back 7 pounds.  GLP-1 agonist would be a great addition to his medications for diabetes.   Medication Adjustments/Labs and Tests Ordered: Current medicines are reviewed at length with the patient today.  Concerns regarding medicines are outlined above.  Medication changes, Labs and Tests ordered today are listed in the Patient Instructions below. Patient Instructions  Medication Instructions:  Increase Amlodipine to 10mg  daily *If you need a refill on your cardiac medications before your next appointment, please call your pharmacy*  Follow-Up: At [facility], you and your health needs are our priority.  As part of our continuing mission to provide you with exceptional heart care, we have created designated Provider Care Teams.  These Care Teams include your primary Cardiologist (physician) and Advanced Practice Providers (APPs -  Physician Assistants and Nurse Practitioners) who all work together to provide you with the care you need, when you need it.  We recommend signing up for the patient portal called "MyChart".  Sign up information is provided on this After Visit Summary.  MyChart is used to connect with patients for Virtual Visits (Telemedicine).  Patients are able to view lab/test results, encounter notes, upcoming appointments, etc.  Non-urgent messages can be sent to your provider as well.   To learn more about what you can do with MyChart, go to [email or web address].    Your next appointment:   1 year(s)  Provider:   Sanda Klein, MD       Signed, Sanda Klein, MD  08/26/2022 3:38 PM    [facility] [address], [place], [place]  [address] Phone: [phone]; Fax: [phone]

## 2022-08-21 NOTE — Patient Instructions (Signed)
Medication Instructions:  Increase Amlodipine to 10mg  daily *If you need a refill on your cardiac medications before your next appointment, please call your pharmacy*  Follow-Up: At [facility], you and your health needs are our priority.  As part of our continuing mission to provide you with exceptional heart care, we have created designated Provider Care Teams.  These Care Teams include your primary Cardiologist (physician) and Advanced Practice Providers (APPs -  Physician Assistants and Nurse Practitioners) who all work together to provide you with the care you need, when you need it.  We recommend signing up for the patient portal called "MyChart".  Sign up information is provided on this After Visit Summary.  MyChart is used to connect with patients for Virtual Visits (Telemedicine).  Patients are able to view lab/test results, encounter notes, upcoming appointments, etc.  Non-urgent messages can be sent to your provider as well.   To learn more about what you can do with MyChart, go to [email or web address].    Your next appointment:   1 year(s)  Provider:   Sanda Klein, MD

## 2022-08-23 ENCOUNTER — Telehealth: Payer: Self-pay | Admitting: Cardiovascular Disease

## 2022-08-23 ENCOUNTER — Telehealth: Payer: Self-pay

## 2022-08-23 ENCOUNTER — Other Ambulatory Visit (HOSPITAL_COMMUNITY): Payer: Self-pay

## 2022-08-23 NOTE — Telephone Encounter (Signed)
Pharmacy Patient Advocate Encounter  Prior Authorization for Repatha 140mg /ml has been approved by [employer] of [place] (ins).     Effective dates: 3.21.24 through 3.21.25

## 2022-08-23 NOTE — Telephone Encounter (Signed)
Pharmacy Patient Advocate Encounter   Prior Authorization for Repatha 140mg /ml has been approved by [employer] cross of [place] (ins).       Effective dates: 3.21.24 through 3.21.25

## 2022-08-23 NOTE — Telephone Encounter (Signed)
  Pt c/o medication issue:  1. Name of Medication: Evolocumab (REPATHA) 140 MG/ML SOSY   2. How are you currently taking this medication (dosage and times per day)?   Inject 140 mg into the skin every 14 (fourteen) days.    3. Are you having a reaction (difficulty breathing--STAT)? No   4. What is your medication issue? [facility] calling, they said they need prior auth for this medication

## 2022-08-23 NOTE — Telephone Encounter (Signed)
Please disregard prev msg. Pa has actually been initiated under  the [employer] portal and this is how I will check/update you with the status  Status: currently Pending

## 2022-08-23 NOTE — Telephone Encounter (Addendum)
Pharmacy Patient Advocate Encounter    Status is pending

## 2022-08-24 DIAGNOSIS — E1165 Type 2 diabetes mellitus with hyperglycemia: Secondary | ICD-10-CM | POA: Diagnosis not present

## 2022-08-28 ENCOUNTER — Encounter: Payer: Self-pay | Admitting: Nurse Practitioner

## 2022-08-28 ENCOUNTER — Ambulatory Visit: Payer: Medicare Other | Admitting: Nurse Practitioner

## 2022-08-28 VITALS — BP 137/67 | HR 83 | Ht 67.0 in | Wt 197.0 lb

## 2022-08-28 DIAGNOSIS — E1159 Type 2 diabetes mellitus with other circulatory complications: Secondary | ICD-10-CM | POA: Diagnosis not present

## 2022-08-28 DIAGNOSIS — E782 Mixed hyperlipidemia: Secondary | ICD-10-CM

## 2022-08-28 DIAGNOSIS — I10 Essential (primary) hypertension: Secondary | ICD-10-CM | POA: Diagnosis not present

## 2022-08-28 LAB — POCT GLYCOSYLATED HEMOGLOBIN (HGB A1C): Hemoglobin A1C: 7.9 % — AB (ref 4.0–5.6)

## 2022-08-28 MED ORDER — PEN NEEDLES 31G X 8 MM MISC: 6 refills | Status: DC

## 2022-08-28 MED ORDER — INSULIN LISPRO (1 UNIT DIAL) 100 UNIT/ML (KWIKPEN): 14.0000 [IU] | PEN_INJECTOR | Freq: Three times a day (TID) | SUBCUTANEOUS | 3 refills | Status: DC

## 2022-08-28 NOTE — Progress Notes (Signed)
08/28/2022   Endocrinology follow-up note   Subjective:    Patient ID: Francis Hall, male    DOB: 1956-01-08,    Past Medical History:  Diagnosis Date   Atrial fibrillation ([name])    CAD (coronary artery disease)    [place]   CHF (congestive heart failure) ([place]) 11/08/2010   2D Echo EF=>55%   Diabetes mellitus    insulin   Diverticulosis    Dyspnea    Family hx of colon cancer    GERD (gastroesophageal reflux disease)    HTN (hypertension)    Hyperlipemia    Hyperplastic colon polyp 07/16/07   30cm    Myocardial infarction ([place]) 12/2012   OSA (obstructive sleep apnea) 12/21/2007   sleep study perform REM 67mins REM and NREM was 95.0%, AHI of 12.31hr and RDI of 12.4hr   Past Surgical History:  Procedure Laterality Date   CARDIAC CATHETERIZATION  2006 2011   2 stents   CATARACT EXTRACTION W/PHACO Left 12/03/2013   Procedure: CATARACT EXTRACTION PHACO AND INTRAOCULAR LENS PLACEMENT ([place]);  Surgeon: Tonny Branch, MD;  Location: [facility];  Service: Ophthalmology;  Laterality: Left;  CDE:7.07   CATARACT EXTRACTION W/PHACO Right 12/14/2013   Procedure: CATARACT EXTRACTION PHACO AND INTRAOCULAR LENS PLACEMENT ([facility]);  Surgeon: Tonny Branch, MD;  Location: [facility];  Service: Ophthalmology;  Laterality: Right;  CDE 7.84    COLONOSCOPY  07/2007   Dr Arnoldo Morale: hyperplastic polyp 30cm   COLONOSCOPY N/A 08/12/2012   Procedure: COLONOSCOPY;  Surgeon: Danie Binder, MD;  Location: [facility];  Service: Endoscopy;  Laterality: N/A;  8:30   COLONOSCOPY N/A 11/11/2017   Dr. Oneida Alar: multiple tubular adenomas, next surveillance in 2022.    COLONOSCOPY WITH PROPOFOL N/A 01/10/2021   Procedure: COLONOSCOPY WITH PROPOFOL;  Surgeon: Eloise Harman, DO;  Location: [facility];  Service: Endoscopy;  Laterality: N/A;  8:45 / ASA II   CORONARY ARTERY BYPASS GRAFT  06/2004   x2 performed for 40% left main stenosis, 80% proximal LAD stenosis of the first diagonal artery, and 50% left circumfles  artery priximal stenosis.LIMA to LAD and free radial artery to circumflex coronary artery   LEFT HEART CATHETERIZATION WITH CORONARY/GRAFT ANGIOGRAM  12/29/2012   Procedure: LEFT HEART CATHETERIZATION WITH Beatrix Fetters;  Surgeon: Leonie Man, MD;  Location: [facility] CATH LAB;  Service: Cardiovascular;;   POLYPECTOMY  11/11/2017   Procedure: POLYPECTOMY;  Surgeon: Danie Binder, MD;  [facility]: [facility];  Service: Endoscopy;;  ascending colon, transverse, splenic flexure, sigmoid   POLYPECTOMY  01/10/2021   Procedure: POLYPECTOMY;  Surgeon: Eloise Harman, DO;  Location: [facility];  Service: Endoscopy;;   TONSILLECTOMY  1962   Social History   Socioeconomic History   Marital status: Married    Spouse name: Not on file   Number of children: 2   Years of education: 12   Highest education level: Not on file  Occupational History   Occupation: disabled Dealer   Tobacco Use   Smoking status: Never   Smokeless tobacco: Never  Vaping Use   Vaping Use: Never used  Substance and Sexual Activity   Alcohol use: Yes    Comment: social-2 beers/wk   Drug use: No   Sexual activity: Yes    Birth control/protection: None  Other Topics Concern   Not on file  Social History Narrative   Not on file   Social Determinants of Health  Financial Resource Strain: Low Risk  (02/14/2022)   Overall Financial Resource Strain (CARDIA)    Difficulty of Paying Living Expenses: Not hard at all  Food Insecurity: No Food Insecurity (05/19/2019)   Hunger Vital Sign    Worried About Running Out of Food in the Last Year: Never true    Ran Out of Food in the Last Year: Never true  Transportation Needs: No Transportation Needs (05/19/2019)   PRAPARE - Hydrologist (Medical): No    Lack of Transportation (Non-Medical): No  Physical Activity: Not on file  Stress: Not on file  Social Connections: Not on file   Outpatient Encounter Medications as of 08/28/2022   Medication Sig   acetaminophen (TYLENOL) 325 MG tablet Take 650 mg by mouth as needed for moderate pain or headache.   amLODipine (NORVASC) 10 MG tablet Take 1 tablet (10 mg total) by mouth daily.   aspirin 81 MG EC tablet Take 81 mg by mouth daily.   blood glucose meter kit and supplies KIT Dispense based on patient and insurance preference. Use up to four times daily as directed. (FOR ICD-10 E11.65)   blood glucose meter kit and supplies 1 each by Other route 4 (four) times daily. Dispense based on patient and insurance preference. Use up to four times daily as directed. (FOR ICD-10 E10.9, E11.9).   carvedilol (COREG) 12.5 MG tablet TAKE 1 TABLET(12.5 MG) BY MOUTH TWICE DAILY WITH A MEAL   Cholecalciferol (VITAMIN D3) 25 MCG (1000 UT) CAPS Take 2 capsules (2,000 Units total) by mouth daily. (Patient taking differently: Take 1,000 Units by mouth daily.)   Evolocumab (REPATHA) 140 MG/ML SOSY Inject 140 mg into the skin every 14 (fourteen) days.   fenofibrate (TRICOR) 48 MG tablet TAKE 1 TABLET(48 MG) BY MOUTH DAILY   glucose blood (ONETOUCH VERIO) test strip USE TO CHECK BLOOD SUGAR FOUR TIMES DAILY AS DIRECTED   hydrochlorothiazide (HYDRODIURIL) 25 MG tablet TAKE 1 TABLET BY MOUTH DAILY   insulin glargine (LANTUS SOLOSTAR) 100 UNIT/ML Solostar Pen Inject 60 Units into the skin at bedtime.   Insulin Pen Needle (PEN NEEDLES) 31G X 8 MM MISC Use to inject insulin 4 times daily   lisinopril (ZESTRIL) 20 MG tablet Take 1 tablet (20 mg total) by mouth 2 (two) times daily.   metFORMIN (GLUCOPHAGE) 1000 MG tablet Take 1 tablet (1,000 mg total) by mouth 2 (two) times daily with a meal.   Multiple Vitamin (MULTIVITAMIN) capsule Take 1 capsule by mouth daily.   pantoprazole (PROTONIX) 40 MG tablet Take 40 mg by mouth daily.   vitamin C (ASCORBIC ACID) 500 MG tablet Take 500 mg by mouth daily.   [DISCONTINUED] Continuous Blood Gluc Sensor (FREESTYLE LIBRE 14 DAY SENSOR) MISC Inject 1 each into the skin  every 14 (fourteen) days. Use as directed to check blood glucose four times daily   [DISCONTINUED] insulin lispro (HUMALOG KWIKPEN) 100 UNIT/ML KwikPen Inject 10-16 Units into the skin 3 (three) times daily before meals.   insulin lispro (HUMALOG KWIKPEN) 100 UNIT/ML KwikPen Inject 14-20 Units into the skin 3 (three) times daily.   nitroGLYCERIN (NITROSTAT) 0.4 MG SL tablet PLACE 1 TABLET UNDER TONGUE IF NEEDED FOR CHEST PAIN.[name] OF 3 DOSES   No facility-administered encounter medications on file as of 08/28/2022.   ALLERGIES: Allergies  Allergen Reactions   Crestor [Rosuvastatin] Other (See Comments)    myalgia   VACCINATION STATUS:  There is no immunization history on file for this patient.  Diabetes He presents for his follow-up diabetic visit. He has type 2 diabetes mellitus. Onset time: He was diagnosed at approximate age of 12 years. His disease course has been worsening. There are no hypoglycemic associated symptoms. Pertinent negatives for hypoglycemia include no confusion, pallor or seizures. There are no diabetic associated symptoms. Pertinent negatives for diabetes include no fatigue, no polydipsia, no polyphagia, no polyuria and no weakness. Hypoglycemia complications include nocturnal hypoglycemia. Symptoms are stable. Diabetic complications include heart disease, nephropathy and peripheral neuropathy. Risk factors for coronary artery disease include dyslipidemia, diabetes mellitus, hypertension, male sex, sedentary lifestyle and obesity. Current diabetic treatment includes intensive insulin program and oral agent (monotherapy). He is compliant with treatment most of the time. His weight is fluctuating minimally. He is following a generally healthy diet. When asked about meal planning, he reported none. He has had a previous visit with a dietitian. He participates in exercise intermittently. His home blood glucose trend is fluctuating dramatically. His overall blood glucose range is  140-180 mg/dl. (He presents today with his CGM and logs showing fluctuating glycemic profile overall.  His POCT A1c today is 7.9%, increasing from last visit of 7.3%.  Analysis of his CGM shows TIR 14%, TAR 86%, TBR 0% with a GMI of 9.8%.  He notes sometimes his insulin seems to drip back out depending on the area he is injecting.) An ACE inhibitor/angiotensin II receptor blocker is being taken. He does not see a podiatrist.Eye exam is current.  Hyperlipidemia This is a chronic problem. The current episode started more than 1 year ago. The problem is uncontrolled. Recent lipid tests were reviewed and are variable. Exacerbating diseases include chronic renal disease, diabetes and obesity. Factors aggravating his hyperlipidemia include beta blockers and thiazides. Pertinent negatives include no myalgias. Current antihyperlipidemic treatment includes statins and fibric acid derivatives. The current treatment provides moderate improvement of lipids. There are no compliance problems.  Risk factors for coronary artery disease include diabetes mellitus, dyslipidemia, hypertension, male sex, a sedentary lifestyle, obesity and family history.    Review of systems  Constitutional: + Minimally fluctuating body weight,  current Body mass index is 30.85 kg/m. , no fatigue, no subjective hyperthermia, no subjective hypothermia Eyes: no blurry vision, no xerophthalmia ENT: no sore throat, no nodules palpated in throat, no dysphagia/odynophagia, no hoarseness Cardiovascular: no chest pain, no shortness of breath, no palpitations, no leg swelling Respiratory: no cough, no shortness of breath Gastrointestinal: no nausea/vomiting/diarrhea Musculoskeletal: no muscle/joint aches Skin: no rashes, no hyperemia Neurological: no tremors, no numbness, no tingling, no dizziness Psychiatric: no depression, no anxiety   Objective:    BP 137/67 (BP Location: Left Arm, Patient Position: Sitting, Cuff Size: Large)   Pulse 83    Ht 5\' 7"  (1.702 m)   Wt 197 lb (89.4 kg)   BMI 30.85 kg/m   Wt Readings from Last 3 Encounters:  08/28/22 197 lb (89.4 kg)  08/21/22 199 lb 3.2 oz (90.4 kg)  04/25/22 194 lb 6.4 oz (88.2 kg)    BP Readings from Last 3 Encounters:  08/28/22 137/67  04/25/22 (!) 145/81  12/20/21 (!) 152/82     Physical Exam- Limited  Constitutional:  Body mass index is 30.85 kg/m. , not in acute distress, normal state of mind Eyes:  EOMI, no exophthalmos Musculoskeletal: no gross deformities, strength intact in all four extremities, no gross restriction of joint movements Skin:  no rashes, no hyperemia Neurological: no tremor with outstretched hands   Diabetic Foot Exam - Simple  Simple Foot Form Diabetic Foot exam was performed with the following findings: Yes 08/28/2022  4:21 PM  Visual Inspection No deformities, no ulcerations, no other skin breakdown bilaterally: Yes Sensation Testing Intact to touch and monofilament testing bilaterally: Yes Pulse Check Posterior Tibialis and Dorsalis pulse intact bilaterally: Yes Comments     Results for orders placed or performed in visit on 08/28/22  HgB A1c  Result Value Ref Range   Hemoglobin A1C 7.9 (A) 4.0 - 5.6 %   HbA1c POC (<> result, manual entry)     HbA1c, POC (prediabetic range)     HbA1c, POC (controlled diabetic range)     Diabetic Labs (most recent): Lab Results  Component Value Date   HGBA1C 7.9 (A) 08/28/2022   HGBA1C 7.3 (A) 04/25/2022   HGBA1C 7.3 12/20/2021   MICROALBUR 150 04/18/2021   Lipid Panel     Component Value Date/Time   CHOL 244 (H) 12/13/2021 0943   TRIG 388 (H) 12/13/2021 0943   HDL 35 (L) 12/13/2021 0943   CHOLHDL 7.0 (H) 12/13/2021 0943   CHOLHDL 7.0 12/29/2012 0438   VLDL UNABLE TO CALCULATE IF TRIGLYCERIDE OVER 400 mg/dL 12/29/2012 0438   LDLCALC 138 (H) 12/13/2021 0943       Latest Ref Rng & Units 12/13/2021    9:43 AM 04/12/2021   10:08 AM 08/05/2020    8:20 AM  CMP  Glucose 70 - 99  mg/dL 192  147  162   BUN 8 - 27 mg/dL 26  19  16    Creatinine 0.76 - 1.27 mg/dL 1.26  1.09  1.10   Sodium 134 - 144 mmol/L 138  136  140   Potassium 3.5 - 5.2 mmol/L 4.6  4.7  4.8   Chloride 96 - 106 mmol/L 101  99  102   CO2 20 - 29 mmol/L 18  24  21    Calcium 8.6 - 10.2 mg/dL 9.9  9.4  9.6   Total Protein 6.0 - 8.5 g/dL 6.4  6.9  6.8   Total Bilirubin 0.0 - 1.2 mg/dL 0.2  0.3  0.2   Alkaline Phos 44 - 121 IU/L 79  88  88   AST 0 - 40 IU/L 19  16  21    ALT 0 - 44 IU/L 19  18  28      Assessment & Plan:   1) Type 2 diabetes mellitus with vascular disease ([place])  His diabetes is complicated by coronary artery disease status post CABG in 2006 and recent stent placement in 2014.  He presents today with his CGM and logs showing fluctuating glycemic profile overall.  His POCT A1c today is 7.9%, increasing from last visit of 7.3%.  Analysis of his CGM shows TIR 14%, TAR 86%, TBR 0% with a GMI of 9.8%.  He notes sometimes his insulin seems to drip back out depending on the area he is injecting.  -Recent labs reviewed.  POCT UM shows mild microalbuminuria.  He is advised to drink plenty of water throughout the day (something he admits he doesn't do enough of).  - [name] remains at a high risk for more acute and chronic complications of diabetes which include CAD, CVA, CKD, retinopathy, and neuropathy. These are all discussed in detail with the patient.  - Nutritional counseling repeated at each appointment due to patients tendency to fall back in to old habits.  - The patient admits there is a room for improvement in their diet and drink choices. -  Suggestion is made for the patient to avoid simple carbohydrates from their diet including Cakes, Sweet Desserts / Pastries, Ice Cream, Soda (diet and regular), Sweet Tea, Candies, Chips, Cookies, Sweet Pastries, Store Bought Juices, Alcohol in Excess of 1-2 drinks a day, Artificial Sweeteners, Coffee Creamer, and "Sugar-free" Products. This will  help patient to have stable blood glucose profile and potentially avoid unintended weight gain.   - I encouraged the patient to switch to unprocessed or minimally processed complex starch and increased protein intake (animal or plant source), fruits, and vegetables.   - Patient is advised to stick to a routine mealtimes to eat 3 meals a day and avoid unnecessary snacks (to snack only to correct hypoglycemia).  - I have approached patient with the following individualized plan to manage diabetes and patient agrees.  -He is advised to continue his Lantus 60 units SQ nightly and adjust his Novolog to 14-20 units TID with meals if glucose is above 90 and he is eating (Specific instructions on how to titrate insulin dosage based on glucose readings given to patient in writing), and continue Metformin 1000 mg po twice daily with meals.    -He is encouraged to continue to monitor glucose 4 times daily, before meals and before bed, and call the clinic if he has readings less than 70 or greater than 300 for 3 tests in a row.  He is advised to continue using his CGM device.  2) Lipids/HPL:  His most recent lipid panel from 05/03/22 shows uncontrolled LDL at 126 and elevated triglycerides at 321 (worsening).  He is advised to continue Repatha 140 mg SQ every 2 weeks, and Fenofibrate 48 mg po daily.  Side effects and precautions discussed with him.  He is advised to avoid fried foods and butter.    3) Hypertension:  His blood pressure is controlled to target.  He is advised to continue Coreg 12.5 mg po twice daily, Diltiazem 120 mg po daily, HCTZ 25 mg po daily, and Lisinopril 20 mg po twice daily.    4) Weight management- His Body mass index is 30.85 kg/m.  He will benefit from moderate weight loss.  Exercise, carbs management discussed with him.  I advised patient to maintain close follow up with Dr. Sharilyn Sites for primary care needs.  He is  also advised to maintain close follow-up with his  cardiologist.     I spent  48  minutes in the care of the patient today including review of labs from [place], Lipids, Thyroid Function, Hematology (current and previous including abstractions from other facilities); face-to-face time discussing  his blood glucose readings/logs, discussing hypoglycemia and hyperglycemia episodes and symptoms, medications doses, his options of short and long term treatment based on the latest standards of care / guidelines;  discussion about incorporating lifestyle medicine;  and documenting the encounter. Risk reduction counseling performed per USPSTF guidelines to reduce obesity and cardiovascular risk factors.     Please refer to Patient Instructions for Blood Glucose Monitoring and Insulin/Medications Dosing Guide"  in media tab for additional information. Please  also refer to " Patient Self Inventory" in the Media  tab for reviewed elements of pertinent patient history.  Francis Hall participated in the discussions, expressed understanding, and voiced agreement with the above plans.  All questions were answered to his satisfaction. he is encouraged to contact clinic should he have any questions or concerns prior to his return visit.   Follow up plan: Return in about 3 months (around  11/28/2022) for Diabetes F/U with A1c in office, No previsit labs, Bring meter and logs.  Rayetta Pigg, [facility] [address] [place], [place] [address] Phone: [phone] Fax: [phone]  08/28/2022, 4:40 PM

## 2022-09-20 DIAGNOSIS — G720 Drug-induced myopathy: Secondary | ICD-10-CM | POA: Insufficient documentation

## 2022-09-24 DIAGNOSIS — E1165 Type 2 diabetes mellitus with hyperglycemia: Secondary | ICD-10-CM | POA: Diagnosis not present

## 2022-09-27 ENCOUNTER — Other Ambulatory Visit: Payer: Self-pay | Admitting: *Deleted

## 2022-09-27 ENCOUNTER — Other Ambulatory Visit: Payer: Self-pay | Admitting: Cardiovascular Disease

## 2022-09-27 MED ORDER — FENOFIBRATE 48 MG PO TABS: ORAL_TABLET | ORAL | 3 refills | Status: DC

## 2022-10-24 DIAGNOSIS — E1165 Type 2 diabetes mellitus with hyperglycemia: Secondary | ICD-10-CM | POA: Diagnosis not present

## 2022-11-05 ENCOUNTER — Other Ambulatory Visit: Payer: Self-pay | Admitting: Nurse Practitioner

## 2022-11-24 ENCOUNTER — Other Ambulatory Visit: Payer: Self-pay | Admitting: Cardiovascular Disease

## 2022-11-24 DIAGNOSIS — E1165 Type 2 diabetes mellitus with hyperglycemia: Secondary | ICD-10-CM | POA: Diagnosis not present

## 2022-12-12 ENCOUNTER — Ambulatory Visit (INDEPENDENT_AMBULATORY_CARE_PROVIDER_SITE_OTHER): Payer: Medicare Other | Admitting: Nurse Practitioner

## 2022-12-12 ENCOUNTER — Encounter: Payer: Self-pay | Admitting: Nurse Practitioner

## 2022-12-12 VITALS — BP 119/73 | HR 91 | Ht 67.0 in | Wt 191.2 lb

## 2022-12-12 DIAGNOSIS — E1159 Type 2 diabetes mellitus with other circulatory complications: Secondary | ICD-10-CM

## 2022-12-12 DIAGNOSIS — Z794 Long term (current) use of insulin: Secondary | ICD-10-CM

## 2022-12-12 DIAGNOSIS — E782 Mixed hyperlipidemia: Secondary | ICD-10-CM | POA: Diagnosis not present

## 2022-12-12 DIAGNOSIS — Z7984 Long term (current) use of oral hypoglycemic drugs: Secondary | ICD-10-CM | POA: Diagnosis not present

## 2022-12-12 DIAGNOSIS — I10 Essential (primary) hypertension: Secondary | ICD-10-CM | POA: Diagnosis not present

## 2022-12-12 LAB — POCT GLYCOSYLATED HEMOGLOBIN (HGB A1C): Hemoglobin A1C: 6.9 % — AB (ref 4.0–5.6)

## 2022-12-12 MED ORDER — LANTUS SOLOSTAR 100 UNIT/ML ~~LOC~~ SOPN
60.0000 [IU] | PEN_INJECTOR | Freq: Every day | SUBCUTANEOUS | 3 refills | Status: DC
Start: 2022-12-12 — End: 2023-06-19

## 2022-12-12 MED ORDER — METFORMIN HCL 1000 MG PO TABS
1000.0000 mg | ORAL_TABLET | Freq: Two times a day (BID) | ORAL | 3 refills | Status: DC
Start: 2022-12-12 — End: 2023-03-18

## 2022-12-12 MED ORDER — INSULIN LISPRO (1 UNIT DIAL) 100 UNIT/ML (KWIKPEN): 14.0000 [IU] | PEN_INJECTOR | Freq: Three times a day (TID) | SUBCUTANEOUS | 3 refills | Status: DC

## 2022-12-12 NOTE — Progress Notes (Signed)
12/12/2022   Endocrinology follow-up note   Subjective:    Patient ID: Francis Hall, male    DOB: 07/03/1955,    Past Medical History:  Diagnosis Date   Atrial fibrillation (HCC)    CAD (coronary artery disease)    [facility]   CHF (congestive heart failure) (HCC) 11/08/2010   2D Echo EF=>55%   Diabetes mellitus    insulin   Diverticulosis    Dyspnea    Family hx of colon cancer    GERD (gastroesophageal reflux disease)    HTN (hypertension)    Hyperlipemia    Hyperplastic colon polyp 07/16/07   30cm    Myocardial infarction (HCC) 12/2012   OSA (obstructive sleep apnea) 12/21/2007   sleep study perform REM REM and NREM was 95.0%, AHI of 12.31hr and RDI of 12.4hr   Past Surgical History:  Procedure Laterality Date   CARDIAC CATHETERIZATION  2006 2011   2 stents   CATARACT EXTRACTION W/PHACO Left 12/03/2013   Procedure: CATARACT EXTRACTION PHACO AND INTRAOCULAR LENS PLACEMENT (IOC);  Surgeon: Gemma Payor, MD;  Location: [facility];  Service: Ophthalmology;  Laterality: Left;  CDE:7.07   CATARACT EXTRACTION W/PHACO Right 12/14/2013   Procedure: CATARACT EXTRACTION PHACO AND INTRAOCULAR LENS PLACEMENT (IOC);  Surgeon: Gemma Payor, MD;  Location: [facility];  Service: Ophthalmology;  Laterality: Right;  CDE 7.84    COLONOSCOPY  07/2007   Dr Lovell Sheehan: hyperplastic polyp 30cm   COLONOSCOPY N/A 08/12/2012   Procedure: COLONOSCOPY;  Surgeon: West Bali, MD;  Location: [facility];  Service: Endoscopy;  Laterality: N/A;  8:30   COLONOSCOPY N/A 11/11/2017   Dr. Darrick Penna: multiple tubular adenomas, next surveillance in 2022.    COLONOSCOPY WITH PROPOFOL N/A 01/10/2021   Procedure: COLONOSCOPY WITH PROPOFOL;  Surgeon: Lanelle Bal, DO;  Location: [facility];  Service: Endoscopy;  Laterality: N/A;  8:45 / ASA II   CORONARY ARTERY BYPASS GRAFT  06/2004   x2 performed for 40% left main stenosis, 80% proximal LAD stenosis of the first diagonal artery, and 50% left circumfles  artery priximal stenosis.LIMA to LAD and free radial artery to circumflex coronary artery   LEFT HEART CATHETERIZATION WITH CORONARY/GRAFT ANGIOGRAM  12/29/2012   Procedure: LEFT HEART CATHETERIZATION WITH Isabel Caprice;  Surgeon: Marykay Lex, MD;  Location: [facility] CATH LAB;  Service: Cardiovascular;;   POLYPECTOMY  11/11/2017   Procedure: POLYPECTOMY;  Surgeon: West Bali, MD;  Location: [facility];  Service: Endoscopy;;  ascending colon, transverse, splenic flexure, sigmoid   POLYPECTOMY  01/10/2021   Procedure: POLYPECTOMY;  Surgeon: Lanelle Bal, DO;  Location: [facility];  Service: Endoscopy;;   TONSILLECTOMY  1962   Social History   Socioeconomic History   Marital status: Married    Spouse name: Not on file   Number of children: 2   Years of education: 12   Highest education level: Not on file  Occupational History   Occupation: disabled Curator   Tobacco Use   Smoking status: Never   Smokeless tobacco: Never  Vaping Use   Vaping Use: Never used  Substance and Sexual Activity   Alcohol use: Yes    Comment: social-2 beers/wk   Drug use: No   Sexual activity: Yes    Birth control/protection: None  Other Topics Concern   Not on file  Social History Narrative   Not on file   Social Determinants of Health  Financial Resource Strain: Low Risk  (02/14/2022)   Overall Financial Resource Strain (CARDIA)    Difficulty of Paying Living Expenses: Not hard at all  Food Insecurity: No Food Insecurity (05/19/2019)   Hunger Vital Sign    Worried About Running Out of Food in the Last Year: Never true    Ran Out of Food in the Last Year: Never true  Transportation Needs: No Transportation Needs (05/19/2019)   PRAPARE - Administrator, Civil Service (Medical): No    Lack of Transportation (Non-Medical): No  Physical Activity: Not on file  Stress: Not on file  Social Connections: Not on file   Outpatient Encounter Medications as of 12/12/2022   Medication Sig   acetaminophen (TYLENOL) 325 MG tablet Take 650 mg by mouth as needed for moderate pain or headache.   amLODipine (NORVASC) 10 MG tablet Take 1 tablet (10 mg total) by mouth daily.   aspirin 81 MG EC tablet Take 81 mg by mouth daily.   blood glucose meter kit and supplies KIT Dispense based on patient and insurance preference. Use up to four times daily as directed. (FOR ICD-10 E11.65)   blood glucose meter kit and supplies 1 each by Other route 4 (four) times daily. Dispense based on patient and insurance preference. Use up to four times daily as directed. (FOR ICD-10 E10.9, E11.9).   carvedilol (COREG) 12.5 MG tablet TAKE 1 TABLET(12.5 MG) BY MOUTH TWICE DAILY WITH A MEAL   Cholecalciferol (VITAMIN D3) 25 MCG (1000 UT) CAPS Take 2 capsules (2,000 Units total) by mouth daily. (Patient taking differently: Take 1,000 Units by mouth daily.)   Evolocumab (REPATHA) 140 MG/ML SOSY Inject 140 mg into the skin every 14 (fourteen) days.   fenofibrate (TRICOR) 48 MG tablet TAKE 1 TABLET(48 MG) BY MOUTH DAILY   glucose blood (ONETOUCH VERIO) test strip USE TO CHECK BLOOD SUGAR FOUR TIMES DAILY AS DIRECTED   hydrochlorothiazide (HYDRODIURIL) 25 MG tablet TAKE 1 TABLET BY MOUTH DAILY   insulin glargine (LANTUS SOLOSTAR) 100 UNIT/ML Solostar Pen Inject 60 Units into the skin at bedtime.   insulin lispro (HUMALOG KWIKPEN) 100 UNIT/ML KwikPen Inject 14-20 Units into the skin 3 (three) times daily.   Insulin Pen Needle (PEN NEEDLES) 31G X 8 MM MISC Use to inject insulin 4 times daily   lisinopril (ZESTRIL) 20 MG tablet TAKE 1 TABLET(20 MG) BY MOUTH TWICE DAILY   metFORMIN (GLUCOPHAGE) 1000 MG tablet Take 1 tablet (1,000 mg total) by mouth 2 (two) times daily with a meal.   Multiple Vitamin (MULTIVITAMIN) capsule Take 1 capsule by mouth daily.   nitroGLYCERIN (NITROSTAT) 0.4 MG SL tablet PLACE 1 TABLET UNDER TONGUE IF NEEDED FOR CHEST PAIN.Jake Seats OF 3 DOSES   pantoprazole (PROTONIX) 40 MG tablet  Take 40 mg by mouth daily.   vitamin C (ASCORBIC ACID) 500 MG tablet Take 500 mg by mouth daily.   No facility-administered encounter medications on file as of 12/12/2022.   ALLERGIES: Allergies  Allergen Reactions   Crestor [Rosuvastatin] Other (See Comments)    myalgia   VACCINATION STATUS:  There is no immunization history on file for this patient.  Diabetes He presents for his follow-up diabetic visit. He has type 2 diabetes mellitus. Onset time: He was diagnosed at approximate age of 42 years. His disease course has been improving. There are no hypoglycemic associated symptoms. Pertinent negatives for hypoglycemia include no confusion, pallor or seizures. There are no diabetic associated symptoms. Pertinent negatives for diabetes include  no fatigue, no polydipsia, no polyphagia, no polyuria and no weakness. Hypoglycemia complications include nocturnal hypoglycemia. Symptoms are stable. Diabetic complications include heart disease, nephropathy and peripheral neuropathy. Risk factors for coronary artery disease include dyslipidemia, diabetes mellitus, hypertension, male sex, sedentary lifestyle and obesity. Current diabetic treatment includes intensive insulin program and oral agent (monotherapy). He is compliant with treatment most of the time. His weight is fluctuating minimally. He is following a generally healthy diet. When asked about meal planning, he reported none. He has had a previous visit with a dietitian. He participates in exercise intermittently. His home blood glucose trend is fluctuating dramatically. His overall blood glucose range is 180-200 mg/dl. (He presents today with his CGM and logs showing fluctuating glycemic profile overall.  His POCT A1c today is 6.9%, decreasing from last visit of 7.9%.  Analysis of his CGM shows TIR 34%, TAR 66%, TBR 0% with a GMI of 8.2%.  ) An ACE inhibitor/angiotensin II receptor blocker is being taken. He does not see a podiatrist.Eye exam is  current.  Hyperlipidemia This is a chronic problem. The current episode started more than 1 year ago. The problem is uncontrolled. Recent lipid tests were reviewed and are variable. Exacerbating diseases include chronic renal disease, diabetes and obesity. Factors aggravating his hyperlipidemia include beta blockers and thiazides. Pertinent negatives include no myalgias. Current antihyperlipidemic treatment includes statins and fibric acid derivatives. The current treatment provides moderate improvement of lipids. There are no compliance problems.  Risk factors for coronary artery disease include diabetes mellitus, dyslipidemia, hypertension, male sex, a sedentary lifestyle, obesity and family history.    Review of systems  Constitutional: + decreasing body weight,  current Body mass index is 29.95 kg/m. , no fatigue, no subjective hyperthermia, no subjective hypothermia Eyes: no blurry vision, no xerophthalmia ENT: no sore throat, no nodules palpated in throat, no dysphagia/odynophagia, no hoarseness Cardiovascular: no chest pain, no shortness of breath, no palpitations, no leg swelling Respiratory: no cough, no shortness of breath Gastrointestinal: no nausea/vomiting/diarrhea Musculoskeletal: no muscle/joint aches Skin: no rashes, no hyperemia Neurological: no tremors, no numbness, no tingling, no dizziness Psychiatric: no depression, no anxiety   Objective:    BP 119/73 (BP Location: Left Arm, Patient Position: Sitting, Cuff Size: Normal)   Pulse 91   Ht 5\' 7"  (1.702 m)   Wt 191 lb 3.2 oz (86.7 kg)   BMI 29.95 kg/m   Wt Readings from Last 3 Encounters:  12/12/22 191 lb 3.2 oz (86.7 kg)  08/28/22 197 lb (89.4 kg)  08/21/22 199 lb 3.2 oz (90.4 kg)    BP Readings from Last 3 Encounters:  12/12/22 119/73  08/28/22 137/67  04/25/22 (!) 145/81    Physical Exam- Limited  Constitutional:  Body mass index is 29.95 kg/m. , not in acute distress, normal state of mind Eyes:  EOMI,  no exophthalmos Musculoskeletal: no gross deformities, strength intact in all four extremities, no gross restriction of joint movements Skin:  no rashes, no hyperemia Neurological: no tremor with outstretched hands   Diabetic Foot Exam - Simple   No data filed     Results for orders placed or performed in visit on 12/12/22  HgB A1c  Result Value Ref Range   Hemoglobin A1C 6.9 (A) 4.0 - 5.6 %   HbA1c POC (<> result, manual entry)     HbA1c, POC (prediabetic range)     HbA1c, POC (controlled diabetic range)     Diabetic Labs (most recent): Lab Results  Component Value Date  HGBA1C 6.9 (A) 12/12/2022   HGBA1C 7.9 (A) 08/28/2022   HGBA1C 7.3 (A) 04/25/2022   MICROALBUR 150 04/18/2021   Lipid Panel     Component Value Date/Time   CHOL 244 (H) 12/13/2021 0943   TRIG 388 (H) 12/13/2021 0943   HDL 35 (L) 12/13/2021 0943   CHOLHDL 7.0 (H) 12/13/2021 0943   CHOLHDL 7.0 12/29/2012 0438   VLDL UNABLE TO CALCULATE IF TRIGLYCERIDE OVER 400 mg/dL 53/29/9242 6834   LDLCALC 138 (H) 12/13/2021 0943       Latest Ref Rng & Units 12/13/2021    9:43 AM 04/12/2021   10:08 AM 08/05/2020    8:20 AM  CMP  Glucose 70 - 99 mg/dL 196  222  979   BUN 8 - 27 mg/dL 26  19  16    Creatinine 0.76 - 1.27 mg/dL 8.92  1.19  4.17   Sodium 134 - 144 mmol/L 138  136  140   Potassium 3.5 - 5.2 mmol/L 4.6  4.7  4.8   Chloride 96 - 106 mmol/L 101  99  102   CO2 20 - 29 mmol/L 18  24  21    Calcium 8.6 - 10.2 mg/dL 9.9  9.4  9.6   Total Protein 6.0 - 8.5 g/dL 6.4  6.9  6.8   Total Bilirubin 0.0 - 1.2 mg/dL 0.2  0.3  0.2   Alkaline Phos 44 - 121 IU/L 79  88  88   AST 0 - 40 IU/L 19  16  21    ALT 0 - 44 IU/L 19  18  28      Assessment & Plan:   1) Type 2 diabetes mellitus with vascular disease (HCC)  His diabetes is complicated by coronary artery disease status post CABG in 2006 and stent placement in 2014.  He presents today with his CGM and logs showing fluctuating glycemic profile overall.  His POCT  A1c today is 6.9%, decreasing from last visit of 7.9%.  Analysis of his CGM shows TIR 34%, TAR 66%, TBR 0% with a GMI of 8.2%.    -Recent labs reviewed.  He is advised to drink plenty of water throughout the day (something he admits he doesn't do enough of).  - Federico Flake remains at a high risk for more acute and chronic complications of diabetes which include CAD, CVA, CKD, retinopathy, and neuropathy. These are all discussed in detail with the patient.  - Nutritional counseling repeated at each appointment due to patients tendency to fall back in to old habits.  - The patient admits there is a room for improvement in their diet and drink choices. -  Suggestion is made for the patient to avoid simple carbohydrates from their diet including Cakes, Sweet Desserts / Pastries, Ice Cream, Soda (diet and regular), Sweet Tea, Candies, Chips, Cookies, Sweet Pastries, Store Bought Juices, Alcohol in Excess of 1-2 drinks a day, Artificial Sweeteners, Coffee Creamer, and "Sugar-free" Products. This will help patient to have stable blood glucose profile and potentially avoid unintended weight gain.   - I encouraged the patient to switch to unprocessed or minimally processed complex starch and increased protein intake (animal or plant source), fruits, and vegetables.   - Patient is advised to stick to a routine mealtimes to eat 3 meals a day and avoid unnecessary snacks (to snack only to correct hypoglycemia).  - I have approached patient with the following individualized plan to manage diabetes and patient agrees.  -He is advised to continue his Lantus  60 units SQ nightly and adjust his Novolog to 14-20 units TID with meals if glucose is above 90 and he is eating (Specific instructions on how to titrate insulin dosage based on glucose readings given to patient in writing), and continue Metformin 1000 mg po twice daily with meals.    -He is encouraged to continue to monitor glucose 4 times daily, before meals  and before bed, and call the clinic if he has readings less than 70 or greater than 300 for 3 tests in a row.  He is advised to continue using his CGM device.  2) Lipids/HPL:  His most recent lipid panel from 05/03/22 shows uncontrolled LDL at 126 and elevated triglycerides at 321 (worsening).  He is advised to continue Repatha 140 mg SQ every 2 weeks, and Fenofibrate 48 mg po daily.  Side effects and precautions discussed with him.  He is advised to avoid fried foods and butter.    3) Hypertension:  His blood pressure is controlled to target.  He is advised to continue Coreg 12.5 mg po twice daily, Diltiazem 120 mg po daily, HCTZ 25 mg po daily, and Lisinopril 20 mg po twice daily.    4) Weight management- His Body mass index is 29.95 kg/m.  He will benefit from moderate weight loss.  Exercise, carbs management discussed with him.  I advised patient to maintain close follow up with Dr. Assunta Found for primary care needs.  He is  also advised to maintain close follow-up with his cardiologist.     I spent  30  minutes in the care of the patient today including review of labs from CMP, Lipids, Thyroid Function, Hematology (current and previous including abstractions from other facilities); face-to-face time discussing  his blood glucose readings/logs, discussing hypoglycemia and hyperglycemia episodes and symptoms, medications doses, his options of short and long term treatment based on the latest standards of care / guidelines;  discussion about incorporating lifestyle medicine;  and documenting the encounter. Risk reduction counseling performed per USPSTF guidelines to reduce obesity and cardiovascular risk factors.     Please refer to Patient Instructions for Blood Glucose Monitoring and Insulin/Medications Dosing Guide"  in media tab for additional information. Please  also refer to " Patient Self Inventory" in the Media  tab for reviewed elements of pertinent patient history.  Francis Hall  participated in the discussions, expressed understanding, and voiced agreement with the above plans.  All questions were answered to his satisfaction. he is encouraged to contact clinic should he have any questions or concerns prior to his return visit.   Follow up plan: Return in about 3 months (around 03/14/2023) for Diabetes F/U with A1c in office, Previsit labs, Bring meter and logs.  Ronny Bacon, [facility] [address] [place], [place] [address] Phone: [phone] Fax: [phone]  12/12/2022, 4:16 PM

## 2022-12-24 DIAGNOSIS — E1165 Type 2 diabetes mellitus with hyperglycemia: Secondary | ICD-10-CM | POA: Diagnosis not present

## 2023-01-21 ENCOUNTER — Other Ambulatory Visit: Payer: Self-pay | Admitting: Cardiovascular Disease

## 2023-01-24 DIAGNOSIS — E1165 Type 2 diabetes mellitus with hyperglycemia: Secondary | ICD-10-CM | POA: Diagnosis not present

## 2023-02-24 DIAGNOSIS — E1165 Type 2 diabetes mellitus with hyperglycemia: Secondary | ICD-10-CM | POA: Diagnosis not present

## 2023-02-28 ENCOUNTER — Other Ambulatory Visit: Payer: Self-pay | Admitting: Cardiovascular Disease

## 2023-03-12 DIAGNOSIS — E1159 Type 2 diabetes mellitus with other circulatory complications: Secondary | ICD-10-CM | POA: Diagnosis not present

## 2023-03-13 LAB — LIPID PANEL
Chol/HDL Ratio: 5.6 ratio — ABNORMAL HIGH (ref 0.0–5.0)
Cholesterol, Total: 202 mg/dL — ABNORMAL HIGH (ref 100–199)
HDL: 36 mg/dL — ABNORMAL LOW
LDL Chol Calc (NIH): 102 mg/dL — ABNORMAL HIGH (ref 0–99)
Triglycerides: 381 mg/dL — ABNORMAL HIGH (ref 0–149)
VLDL Cholesterol Cal: 64 mg/dL — ABNORMAL HIGH (ref 5–40)

## 2023-03-13 LAB — COMPREHENSIVE METABOLIC PANEL
ALT: 21 [IU]/L (ref 0–44)
AST: 17 [IU]/L (ref 0–40)
Albumin: 4.4 g/dL (ref 3.9–4.9)
Alkaline Phosphatase: 75 [IU]/L (ref 44–121)
BUN/Creatinine Ratio: 15 (ref 10–24)
BUN: 20 mg/dL (ref 8–27)
Bilirubin Total: 0.2 mg/dL (ref 0.0–1.2)
CO2: 22 mmol/L (ref 20–29)
Calcium: 9.8 mg/dL (ref 8.6–10.2)
Chloride: 102 mmol/L (ref 96–106)
Creatinine, Ser: 1.34 mg/dL — ABNORMAL HIGH (ref 0.76–1.27)
Globulin, Total: 2.3 g/dL (ref 1.5–4.5)
Glucose: 208 mg/dL — ABNORMAL HIGH (ref 70–99)
Potassium: 5.1 mmol/L (ref 3.5–5.2)
Sodium: 139 mmol/L (ref 134–144)
Total Protein: 6.7 g/dL (ref 6.0–8.5)
eGFR: 59 mL/min/{1.73_m2} — ABNORMAL LOW (ref >59)

## 2023-03-13 LAB — VITAMIN D 25 HYDROXY (VIT D DEFICIENCY, FRACTURES): Vit D, 25-Hydroxy: 68.3 ng/mL (ref 30.0–100.0)

## 2023-03-13 LAB — T4, FREE: Free T4: 1.3 ng/dL (ref 0.82–1.77)

## 2023-03-13 LAB — TSH: TSH: 3.22 u[IU]/mL (ref 0.450–4.500)

## 2023-03-18 ENCOUNTER — Encounter: Payer: Self-pay | Admitting: Nurse Practitioner

## 2023-03-18 ENCOUNTER — Ambulatory Visit: Payer: Medicare Other | Admitting: Nurse Practitioner

## 2023-03-18 VITALS — BP 130/74 | HR 95 | Ht 67.0 in | Wt 197.6 lb

## 2023-03-18 DIAGNOSIS — E1159 Type 2 diabetes mellitus with other circulatory complications: Secondary | ICD-10-CM

## 2023-03-18 DIAGNOSIS — I10 Essential (primary) hypertension: Secondary | ICD-10-CM | POA: Diagnosis not present

## 2023-03-18 DIAGNOSIS — E782 Mixed hyperlipidemia: Secondary | ICD-10-CM | POA: Diagnosis not present

## 2023-03-18 DIAGNOSIS — Z7984 Long term (current) use of oral hypoglycemic drugs: Secondary | ICD-10-CM

## 2023-03-18 DIAGNOSIS — Z794 Long term (current) use of insulin: Secondary | ICD-10-CM | POA: Diagnosis not present

## 2023-03-18 LAB — POCT GLYCOSYLATED HEMOGLOBIN (HGB A1C): HbA1c POC (<> result, manual entry): 7 % (ref 4.0–5.6)

## 2023-03-18 LAB — POCT UA - MICROALBUMIN: Microalbumin Ur, POC: 150 mg/L

## 2023-03-18 MED ORDER — METFORMIN HCL 1000 MG PO TABS
500.0000 mg | ORAL_TABLET | Freq: Two times a day (BID) | ORAL | Status: DC
Start: 2023-03-18 — End: 2024-01-20

## 2023-03-18 NOTE — Progress Notes (Signed)
03/18/2023   Endocrinology follow-up note   Subjective:    Patient ID: Francis Hall, male    DOB: 05-Dec-1955,    Past Medical History:  Diagnosis Date   Atrial fibrillation (HCC)    CAD (coronary artery disease)    [facility]   CHF (congestive heart failure) (HCC) 11/08/2010   2D Echo EF=>55%   Diabetes mellitus    insulin   Diverticulosis    Dyspnea    Family hx of colon cancer    GERD (gastroesophageal reflux disease)    HTN (hypertension)    Hyperlipemia    Hyperplastic colon polyp 07/16/07   30cm    Myocardial infarction (HCC) 12/2012   OSA (obstructive sleep apnea) 12/21/2007   sleep study perform REM REM and NREM was 95.0%, AHI of 12.31hr and RDI of 12.4hr   Past Surgical History:  Procedure Laterality Date   CARDIAC CATHETERIZATION  2006 2011   2 stents   CATARACT EXTRACTION W/PHACO Left 12/03/2013   Procedure: CATARACT EXTRACTION PHACO AND INTRAOCULAR LENS PLACEMENT (IOC);  Surgeon: Gemma Payor, MD;  Location: [facility];  Service: Ophthalmology;  Laterality: Left;  CDE:7.07   CATARACT EXTRACTION W/PHACO Right 12/14/2013   Procedure: CATARACT EXTRACTION PHACO AND INTRAOCULAR LENS PLACEMENT (IOC);  Surgeon: Gemma Payor, MD;  Location: [facility];  Service: Ophthalmology;  Laterality: Right;  CDE 7.84    COLONOSCOPY  07/2007   Dr Lovell Sheehan: hyperplastic polyp 30cm   COLONOSCOPY N/A 08/12/2012   Procedure: COLONOSCOPY;  Surgeon: West Bali, MD;  Location: [facility];  Service: Endoscopy;  Laterality: N/A;  8:30   COLONOSCOPY N/A 11/11/2017   Dr. Darrick Penna: multiple tubular adenomas, next surveillance in 2022.    COLONOSCOPY WITH PROPOFOL N/A 01/10/2021   Procedure: COLONOSCOPY WITH PROPOFOL;  Surgeon: Lanelle Bal, DO;  Location: [facility];  Service: Endoscopy;  Laterality: N/A;  8:45 / ASA II   CORONARY ARTERY BYPASS GRAFT  06/2004   x2 performed for 40% left main stenosis, 80% proximal LAD stenosis of the first diagonal artery, and 50% left  circumfles artery priximal stenosis.LIMA to LAD and free radial artery to circumflex coronary artery   LEFT HEART CATHETERIZATION WITH CORONARY/GRAFT ANGIOGRAM  12/29/2012   Procedure: LEFT HEART CATHETERIZATION WITH Isabel Caprice;  Surgeon: Marykay Lex, MD;  [facility]: [facility];  Service: Cardiovascular;;   POLYPECTOMY  11/11/2017   Procedure: POLYPECTOMY;  Surgeon: West Bali, MD;  Location: [facility];  Service: Endoscopy;;  ascending colon, transverse, splenic flexure, sigmoid   POLYPECTOMY  01/10/2021   Procedure: POLYPECTOMY;  Surgeon: Lanelle Bal, DO;  Location: [facility];  Service: Endoscopy;;   TONSILLECTOMY  1962   Social History   Socioeconomic History   Marital status: Married    Spouse name: Not on file   Number of children: 2   Years of education: 12   Highest education level: Not on file  Occupational History   Occupation: disabled Curator   Tobacco Use   Smoking status: Never   Smokeless tobacco: Never  Vaping Use   Vaping status: Never Used  Substance and Sexual Activity   Alcohol use: Yes    Comment: social-2 beers/wk   Drug use: No   Sexual activity: Yes    Birth control/protection: None  Other Topics Concern   Not on file  Social History Narrative   Not on file   Social Determinants of Health  Financial Resource Strain: Low Risk  (02/14/2022)   Overall Financial Resource Strain (CARDIA)    Difficulty of Paying Living Expenses: Not hard at all  Food Insecurity: No Food Insecurity (05/19/2019)   Hunger Vital Sign    Worried About Running Out of Food in the Last Year: Never true    Ran Out of Food in the Last Year: Never true  Transportation Needs: No Transportation Needs (05/19/2019)   PRAPARE - Administrator, Civil Service (Medical): No    Lack of Transportation (Non-Medical): No  Physical Activity: Not on file  Stress: Not on file  Social Connections: Not on file   Outpatient Encounter Medications as  of 03/18/2023  Medication Sig   acetaminophen (TYLENOL) 325 MG tablet Take 650 mg by mouth as needed for moderate pain or headache.   amLODipine (NORVASC) 10 MG tablet Take 1 tablet (10 mg total) by mouth daily.   aspirin 81 MG EC tablet Take 81 mg by mouth daily.   blood glucose meter kit and supplies KIT Dispense based on patient and insurance preference. Use up to four times daily as directed. (FOR ICD-10 E11.65)   blood glucose meter kit and supplies 1 each by Other route 4 (four) times daily. Dispense based on patient and insurance preference. Use up to four times daily as directed. (FOR ICD-10 E10.9, E11.9).   carvedilol (COREG) 12.5 MG tablet TAKE 1 TABLET(12.5 MG) BY MOUTH TWICE DAILY WITH A MEAL   Cholecalciferol (VITAMIN D3) 25 MCG (1000 UT) CAPS Take 2 capsules (2,000 Units total) by mouth daily. (Patient taking differently: Take 1,000 Units by mouth daily.)   Evolocumab (REPATHA) 140 MG/ML SOSY Inject 140 mg into the skin every 14 (fourteen) days.   fenofibrate (TRICOR) 48 MG tablet TAKE 1 TABLET(48 MG) BY MOUTH DAILY   glucose blood (ONETOUCH VERIO) test strip USE TO CHECK BLOOD SUGAR FOUR TIMES DAILY AS DIRECTED   hydrochlorothiazide (HYDRODIURIL) 25 MG tablet TAKE 1 TABLET(25 MG) BY MOUTH DAILY   insulin glargine (LANTUS SOLOSTAR) 100 UNIT/ML Solostar Pen Inject 60 Units into the skin at bedtime.   insulin lispro (HUMALOG KWIKPEN) 100 UNIT/ML KwikPen Inject 14-20 Units into the skin 3 (three) times daily.   Insulin Pen Needle (PEN NEEDLES) 31G X 8 MM MISC Use to inject insulin 4 times daily   lisinopril (ZESTRIL) 20 MG tablet TAKE 1 TABLET(20 MG) BY MOUTH TWICE DAILY   Multiple Vitamin (MULTIVITAMIN) capsule Take 1 capsule by mouth daily.   nitroGLYCERIN (NITROSTAT) 0.4 MG SL tablet PLACE 1 TABLET UNDER TONGUE IF NEEDED FOR CHEST PAIN.Jake Seats OF 3 DOSES   pantoprazole (PROTONIX) 40 MG tablet Take 40 mg by mouth daily.   vitamin C (ASCORBIC ACID) 500 MG tablet Take 500 mg by mouth  daily.   [DISCONTINUED] metFORMIN (GLUCOPHAGE) 1000 MG tablet Take 1 tablet (1,000 mg total) by mouth 2 (two) times daily with a meal.   metFORMIN (GLUCOPHAGE) 1000 MG tablet Take 0.5 tablets (500 mg total) by mouth 2 (two) times daily with a meal.   No facility-administered encounter medications on file as of 03/18/2023.   ALLERGIES: Allergies  Allergen Reactions   Crestor [Rosuvastatin] Other (See Comments)    myalgia   VACCINATION STATUS:  There is no immunization history on file for this patient.  Diabetes He presents for his follow-up diabetic visit. He has type 2 diabetes mellitus. Onset time: He was diagnosed at approximate age of 19 years. His disease course has been worsening. There are no  hypoglycemic associated symptoms. Pertinent negatives for hypoglycemia include no confusion, pallor or seizures. There are no diabetic associated symptoms. Pertinent negatives for diabetes include no fatigue, no polydipsia, no polyphagia, no polyuria and no weakness. Hypoglycemia complications include nocturnal hypoglycemia. Symptoms are stable. Diabetic complications include heart disease, nephropathy and peripheral neuropathy. Risk factors for coronary artery disease include dyslipidemia, diabetes mellitus, hypertension, male sex, sedentary lifestyle and obesity. Current diabetic treatment includes intensive insulin program and oral agent (monotherapy). He is compliant with treatment some of the time (has been missing opportunities to inject his meal time insulin recently). His weight is fluctuating minimally. He is following a generally healthy diet. When asked about meal planning, he reported none. He has had a previous visit with a dietitian. He participates in exercise intermittently. His home blood glucose trend is fluctuating dramatically. His overall blood glucose range is >200 mg/dl. (He presents today with his CGM and logs showing fluctuating glycemic profile overall.  His POCT A1c today is 7%,  increasing slightly from last visit of 6.9%.  Analysis of his CGM shows TIR 30%, TAR 70%, TBR 0% with a GMI of 8.4%.  He admits he has not done well with his diet recently and has missed opportunities to inject his meal time insulin as he did not take his insulin pen with him.) An ACE inhibitor/angiotensin II receptor blocker is being taken. He does not see a podiatrist.Eye exam is current.  Hyperlipidemia This is a chronic problem. The current episode started more than 1 year ago. The problem is uncontrolled. Recent lipid tests were reviewed and are variable. Exacerbating diseases include chronic renal disease, diabetes and obesity. Factors aggravating his hyperlipidemia include beta blockers and thiazides. Pertinent negatives include no myalgias. Current antihyperlipidemic treatment includes statins and fibric acid derivatives. The current treatment provides moderate improvement of lipids. There are no compliance problems.  Risk factors for coronary artery disease include diabetes mellitus, dyslipidemia, hypertension, male sex, a sedentary lifestyle, obesity and family history.    Review of systems  Constitutional: + minimally fluctuating body weight,  current Body mass index is 30.95 kg/m. , no fatigue, no subjective hyperthermia, no subjective hypothermia Eyes: no blurry vision, no xerophthalmia ENT: no sore throat, no nodules palpated in throat, no dysphagia/odynophagia, no hoarseness Cardiovascular: no chest pain, no shortness of breath, no palpitations, no leg swelling Respiratory: no cough, no shortness of breath Gastrointestinal: no nausea/vomiting/diarrhea Genitourinary: + nocturia Musculoskeletal: no muscle/joint aches Skin: no rashes, no hyperemia Neurological: no tremors, no numbness, no tingling, no dizziness Psychiatric: no depression, no anxiety   Objective:    BP 130/74 (BP Location: Left Arm, Patient Position: Sitting, Cuff Size: Large)   Pulse 95   Ht 5\' 7"  (1.702 m)   Wt  197 lb 9.6 oz (89.6 kg)   BMI 30.95 kg/m   Wt Readings from Last 3 Encounters:  03/18/23 197 lb 9.6 oz (89.6 kg)  12/12/22 191 lb 3.2 oz (86.7 kg)  08/28/22 197 lb (89.4 kg)    BP Readings from Last 3 Encounters:  03/18/23 130/74  12/12/22 119/73  08/28/22 137/67    Physical Exam- Limited  Constitutional:  Body mass index is 30.95 kg/m. , not in acute distress, normal state of mind Eyes:  EOMI, no exophthalmos Musculoskeletal: no gross deformities, strength intact in all four extremities, no gross restriction of joint movements Skin:  no rashes, no hyperemia Neurological: no tremor with outstretched hands   Diabetic Foot Exam - Simple   No data filed  Results for orders placed or performed in visit on 03/18/23  HgB A1c  Result Value Ref Range   Hemoglobin A1C     HbA1c POC (<> result, manual entry) 7.0 4.0 - 5.6 %   HbA1c, POC (prediabetic range)     HbA1c, POC (controlled diabetic range)    POCT UA - Microalbumin  Result Value Ref Range   Microalbumin Ur, POC 150 mg/L mg/L   Creatinine, POC 300mg /dL mg/dL   Albumin/Creatinine Ratio, Urine, POC 30-300 mg/G    Diabetic Labs (most recent): Lab Results  Component Value Date   HGBA1C 7.0 03/18/2023   HGBA1C 6.9 (A) 12/12/2022   HGBA1C 7.9 (A) 08/28/2022   MICROALBUR 150 mg/L 03/18/2023   MICROALBUR 150 04/18/2021   Lipid Panel     Component Value Date/Time   CHOL 202 (H) 03/12/2023 1006   TRIG 381 (H) 03/12/2023 1006   HDL 36 (L) 03/12/2023 1006   CHOLHDL 5.6 (H) 03/12/2023 1006   CHOLHDL 7.0 12/29/2012 0438   VLDL UNABLE TO CALCULATE IF TRIGLYCERIDE OVER 400 mg/dL 16/03/9603 5409   LDLCALC 102 (H) 03/12/2023 1006       Latest Ref Rng & Units 03/12/2023   10:06 AM 12/13/2021    9:43 AM 04/12/2021   10:08 AM  CMP  Glucose 70 - 99 mg/dL 811  914  782   BUN 8 - 27 mg/dL 20  26  19    Creatinine 0.76 - 1.27 mg/dL 9.56  2.13  0.86   Sodium 134 - 144 mmol/L 139  138  136   Potassium 3.5 - 5.2 mmol/L 5.1   4.6  4.7   Chloride 96 - 106 mmol/L 102  101  99   CO2 20 - 29 mmol/L 22  18  24    Calcium 8.6 - 10.2 mg/dL 9.8  9.9  9.4   Total Protein 6.0 - 8.5 g/dL 6.7  6.4  6.9   Total Bilirubin 0.0 - 1.2 mg/dL 0.2  0.2  0.3   Alkaline Phos 44 - 121 IU/L 75  79  88   AST 0 - 40 IU/L 17  19  16    ALT 0 - 44 IU/L 21  19  18      Assessment & Plan:   1) Type 2 diabetes mellitus with vascular disease (HCC)  His diabetes is complicated by coronary artery disease status post CABG in 2006 and stent placement in 2014.  He presents today with his CGM and logs showing fluctuating glycemic profile overall.  His POCT A1c today is 7%, increasing slightly from last visit of 6.9%.  Analysis of his CGM shows TIR 30%, TAR 70%, TBR 0% with a GMI of 8.4%.  He admits he has not done well with his diet recently and has missed opportunities to inject his meal time insulin as he did not take his insulin pen with him.   -Recent labs reviewed.  His kidneys are slowly declining and he did have mild microalbuminuria today.  He is advised to drink plenty of water throughout the day (something he admits he doesn't do enough of).  He is going for his AWV with his PCP in December, I asked that they send over any labs for our records.  - Federico Flake remains at a high risk for more acute and chronic complications of diabetes which include CAD, CVA, CKD, retinopathy, and neuropathy. These are all discussed in detail with the patient.  - Nutritional counseling repeated at each appointment due to patients  tendency to fall back in to old habits.  - The patient admits there is a room for improvement in their diet and drink choices. -  Suggestion is made for the patient to avoid simple carbohydrates from their diet including Cakes, Sweet Desserts / Pastries, Ice Cream, Soda (diet and regular), Sweet Tea, Candies, Chips, Cookies, Sweet Pastries, Store Bought Juices, Alcohol in Excess of 1-2 drinks a day, Artificial Sweeteners, Coffee  Creamer, and "Sugar-free" Products. This will help patient to have stable blood glucose profile and potentially avoid unintended weight gain.   - I encouraged the patient to switch to unprocessed or minimally processed complex starch and increased protein intake (animal or plant source), fruits, and vegetables.   - Patient is advised to stick to a routine mealtimes to eat 3 meals a day and avoid unnecessary snacks (to snack only to correct hypoglycemia).  - I have approached patient with the following individualized plan to manage diabetes and patient agrees.  -He is advised to continue his Lantus 60 units SQ nightly and Novolog to 14-20 units TID with meals if glucose is above 90 and he is eating (Specific instructions on how to titrate insulin dosage based on glucose readings given to patient in writing), and lower Metformin to 500 mg po twice daily with meals (can cut his current 1000 mg tabs in half) to see if his kidneys rebound.    -He is encouraged to continue to monitor glucose 4 times daily, before meals and before bed, and call the clinic if he has readings less than 70 or greater than 300 for 3 tests in a row.  He is advised to continue using his CGM device.  2) Lipids/HPL:  His most recent lipid panel from 03/12/23 shows uncontrolled LDL at 102 and elevated triglycerides at 381 (worsening).  He notes he does not tolerate statins and he is having similar side effects with his Repatha so he stopped taking it about 2 weeks ago.  He is advised to reach out to his cardiologist to let them know of the side effects and see if there are other options.  He is advised to avoid fried foods and butter.    3) Hypertension:  His blood pressure is controlled to target.  He is advised to continue Coreg 12.5 mg po twice daily, Diltiazem 120 mg po daily, HCTZ 25 mg po daily, and Lisinopril 20 mg po twice daily.    4) Weight management- His Body mass index is 30.95 kg/m.  He will benefit from moderate  weight loss.  Exercise, carbs management discussed with him.  I advised patient to maintain close follow up with Dr. Assunta Found for primary care needs.  He is  also advised to maintain close follow-up with his cardiologist.    I spent  42  minutes in the care of the patient today including review of labs from CMP, Lipids, Thyroid Function, Hematology (current and previous including abstractions from other facilities); face-to-face time discussing  his blood glucose readings/logs, discussing hypoglycemia and hyperglycemia episodes and symptoms, medications doses, his options of short and long term treatment based on the latest standards of care / guidelines;  discussion about incorporating lifestyle medicine;  and documenting the encounter. Risk reduction counseling performed per USPSTF guidelines to reduce obesity and cardiovascular risk factors.     Please refer to Patient Instructions for Blood Glucose Monitoring and Insulin/Medications Dosing Guide"  in media tab for additional information. Please  also refer to " Patient Self Inventory" in  the Media  tab for reviewed elements of pertinent patient history.  Francis Hall participated in the discussions, expressed understanding, and voiced agreement with the above plans.  All questions were answered to his satisfaction. he is encouraged to contact clinic should he have any questions or concerns prior to his return visit.   Follow up plan: Return in about 3 months (around 06/18/2023) for Diabetes F/U with A1c in office, No previsit labs, Bring meter and logs.  Ronny Bacon, [facility] [address] [place], [place] [address] Phone: [phone] Fax: [phone]  03/18/2023, 4:07 PM

## 2023-03-26 DIAGNOSIS — E1165 Type 2 diabetes mellitus with hyperglycemia: Secondary | ICD-10-CM | POA: Diagnosis not present

## 2023-04-26 DIAGNOSIS — E1165 Type 2 diabetes mellitus with hyperglycemia: Secondary | ICD-10-CM | POA: Diagnosis not present

## 2023-05-04 DIAGNOSIS — G720 Drug-induced myopathy: Secondary | ICD-10-CM | POA: Diagnosis not present

## 2023-05-04 DIAGNOSIS — I110 Hypertensive heart disease with heart failure: Secondary | ICD-10-CM | POA: Diagnosis not present

## 2023-05-04 DIAGNOSIS — I2510 Atherosclerotic heart disease of native coronary artery without angina pectoris: Secondary | ICD-10-CM | POA: Diagnosis not present

## 2023-05-04 DIAGNOSIS — E1140 Type 2 diabetes mellitus with diabetic neuropathy, unspecified: Secondary | ICD-10-CM | POA: Diagnosis not present

## 2023-05-06 DIAGNOSIS — R972 Elevated prostate specific antigen [PSA]: Secondary | ICD-10-CM | POA: Diagnosis not present

## 2023-05-06 DIAGNOSIS — E785 Hyperlipidemia, unspecified: Secondary | ICD-10-CM | POA: Diagnosis not present

## 2023-05-06 DIAGNOSIS — I10 Essential (primary) hypertension: Secondary | ICD-10-CM | POA: Diagnosis not present

## 2023-05-06 DIAGNOSIS — Z1331 Encounter for screening for depression: Secondary | ICD-10-CM | POA: Diagnosis not present

## 2023-05-06 DIAGNOSIS — Z0001 Encounter for general adult medical examination with abnormal findings: Secondary | ICD-10-CM | POA: Diagnosis not present

## 2023-05-06 DIAGNOSIS — Z6831 Body mass index (BMI) 31.0-31.9, adult: Secondary | ICD-10-CM | POA: Diagnosis not present

## 2023-05-06 DIAGNOSIS — E6609 Other obesity due to excess calories: Secondary | ICD-10-CM | POA: Diagnosis not present

## 2023-05-06 DIAGNOSIS — E1140 Type 2 diabetes mellitus with diabetic neuropathy, unspecified: Secondary | ICD-10-CM | POA: Diagnosis not present

## 2023-05-06 DIAGNOSIS — I2510 Atherosclerotic heart disease of native coronary artery without angina pectoris: Secondary | ICD-10-CM | POA: Diagnosis not present

## 2023-05-06 DIAGNOSIS — I110 Hypertensive heart disease with heart failure: Secondary | ICD-10-CM | POA: Diagnosis not present

## 2023-05-26 DIAGNOSIS — E1165 Type 2 diabetes mellitus with hyperglycemia: Secondary | ICD-10-CM | POA: Diagnosis not present

## 2023-05-27 ENCOUNTER — Other Ambulatory Visit: Payer: Self-pay | Admitting: Cardiovascular Disease

## 2023-05-28 ENCOUNTER — Other Ambulatory Visit: Payer: Self-pay | Admitting: Cardiovascular Disease

## 2023-06-04 ENCOUNTER — Telehealth: Payer: Self-pay | Admitting: Cardiovascular Disease

## 2023-06-04 MED ORDER — HYDROCHLOROTHIAZIDE 25 MG PO TABS: ORAL_TABLET | ORAL | 0 refills | Status: DC

## 2023-06-04 NOTE — Telephone Encounter (Signed)
*  STAT* If patient is at the pharmacy, call can be transferred to refill team.   1. Which medications need to be refilled? (please list name of each medication and dose if known)  Hydrochlorothiazine   2. Would you like to learn more about the convenience, safety, & potential cost savings by using the [facility]?     3. Are you open to using the [facility] (Type [facility]. .   4. Which pharmacy/location (including street and city if local pharmacy) is medication to be sent to?[facility] [place], [place]   5. Do they need a 30 day or 90 day supply? 90 days and refills- please call today- out of medicine

## 2023-06-19 ENCOUNTER — Ambulatory Visit: Payer: Medicare Other | Admitting: Nurse Practitioner

## 2023-06-19 ENCOUNTER — Ambulatory Visit: Payer: Medicare Other | Admitting: Urology

## 2023-06-19 ENCOUNTER — Encounter: Payer: Self-pay | Admitting: Nurse Practitioner

## 2023-06-19 VITALS — BP 166/71 | HR 78

## 2023-06-19 VITALS — BP 142/70 | HR 81 | Ht 67.0 in | Wt 204.6 lb

## 2023-06-19 DIAGNOSIS — R339 Retention of urine, unspecified: Secondary | ICD-10-CM | POA: Diagnosis not present

## 2023-06-19 DIAGNOSIS — E1159 Type 2 diabetes mellitus with other circulatory complications: Secondary | ICD-10-CM | POA: Diagnosis not present

## 2023-06-19 DIAGNOSIS — R972 Elevated prostate specific antigen [PSA]: Secondary | ICD-10-CM | POA: Diagnosis not present

## 2023-06-19 DIAGNOSIS — Z7984 Long term (current) use of oral hypoglycemic drugs: Secondary | ICD-10-CM

## 2023-06-19 DIAGNOSIS — E782 Mixed hyperlipidemia: Secondary | ICD-10-CM

## 2023-06-19 DIAGNOSIS — Z794 Long term (current) use of insulin: Secondary | ICD-10-CM

## 2023-06-19 DIAGNOSIS — I10 Essential (primary) hypertension: Secondary | ICD-10-CM

## 2023-06-19 LAB — POCT GLYCOSYLATED HEMOGLOBIN (HGB A1C): Hemoglobin A1C: 7.6 % — AB (ref 4.0–5.6)

## 2023-06-19 LAB — BLADDER SCAN AMB NON-IMAGING: Scan Result: 174

## 2023-06-19 MED ORDER — ALFUZOSIN HCL ER 10 MG PO TB24: 10.0000 mg | ORAL_TABLET | Freq: Every day | ORAL | 11 refills | Status: DC

## 2023-06-19 MED ORDER — PEN NEEDLES 31G X 8 MM MISC: 6 refills | Status: DC

## 2023-06-19 MED ORDER — LANTUS SOLOSTAR 100 UNIT/ML ~~LOC~~ SOPN: 70.0000 [IU] | PEN_INJECTOR | Freq: Every day | SUBCUTANEOUS | 3 refills | Status: DC

## 2023-06-19 MED ORDER — INSULIN LISPRO (1 UNIT DIAL) 100 UNIT/ML (KWIKPEN): 14.0000 [IU] | PEN_INJECTOR | Freq: Three times a day (TID) | SUBCUTANEOUS | 3 refills | Status: DC

## 2023-06-19 NOTE — Progress Notes (Signed)
post void residual= 174

## 2023-06-19 NOTE — Progress Notes (Signed)
 06/19/2023   Endocrinology follow-up note   Subjective:    Patient ID: Francis Hall, male    DOB: 08-16-1955,    Past Medical History:  Diagnosis Date   Atrial fibrillation (HCC)    CAD (coronary artery disease)    [facility]   CHF (congestive heart failure) (HCC) 11/08/2010   2D Echo EF=>55%   Diabetes mellitus    insulin    Diverticulosis    Dyspnea    Family hx of colon cancer    GERD (gastroesophageal reflux disease)    HTN (hypertension)    Hyperlipemia    Hyperplastic colon polyp 07/16/07   30cm    Myocardial infarction (HCC) 12/2012   OSA (obstructive sleep apnea) 12/21/2007   sleep study perform REM REM and NREM was 95.0%, AHI of 12.31hr and RDI of 12.4hr   Past Surgical History:  Procedure Laterality Date   CARDIAC CATHETERIZATION  2006 2011   2 stents   CATARACT EXTRACTION W/PHACO Left 12/03/2013   Procedure: CATARACT EXTRACTION PHACO AND INTRAOCULAR LENS PLACEMENT (IOC);  Surgeon: Anner Kill, MD;  Location: [facility];  Service: Ophthalmology;  Laterality: Left;  CDE:7.07   CATARACT EXTRACTION W/PHACO Right 12/14/2013   Procedure: CATARACT EXTRACTION PHACO AND INTRAOCULAR LENS PLACEMENT (IOC);  Surgeon: Anner Kill, MD;  Location: [facility];  Service: Ophthalmology;  Laterality: Right;  CDE 7.84    COLONOSCOPY  07/2007   Dr Larrie Po: hyperplastic polyp 30cm   COLONOSCOPY N/A 08/12/2012   Procedure: COLONOSCOPY;  Surgeon: Alyce Jubilee, MD;  Location: [facility];  Service: Endoscopy;  Laterality: N/A;  8:30   COLONOSCOPY N/A 11/11/2017   Dr. Nolene Baumgarten: multiple tubular adenomas, next surveillance in 2022.    COLONOSCOPY WITH PROPOFOL  N/A 01/10/2021   Procedure: COLONOSCOPY WITH PROPOFOL ;  Surgeon: Vinetta Greening, DO;  Location: [facility];  Service: Endoscopy;  Laterality: N/A;  8:45 / ASA II   CORONARY ARTERY BYPASS GRAFT  06/2004   x2 performed for 40% left main stenosis, 80% proximal LAD stenosis of the first diagonal artery, and 50% left circumfles  artery priximal stenosis.LIMA to LAD and free radial artery to circumflex coronary artery   LEFT HEART CATHETERIZATION WITH CORONARY/GRAFT ANGIOGRAM  12/29/2012   Procedure: LEFT HEART CATHETERIZATION WITH Estella Helling;  Surgeon: Arleen Lacer, MD;  Location: [facility] CATH [facility];  Service: Cardiovascular;;   POLYPECTOMY  11/11/2017   Procedure: POLYPECTOMY;  Surgeon: Alyce Jubilee, MD;  Location: [facility];  Service: Endoscopy;;  ascending colon, transverse, splenic flexure, sigmoid   POLYPECTOMY  01/10/2021   Procedure: POLYPECTOMY;  Surgeon: Vinetta Greening, DO;  Location: [facility];  Service: Endoscopy;;   TONSILLECTOMY  1962   Social History   Socioeconomic History   Marital status: Married    Spouse name: Not on file   Number of children: 2   Years of education: 12   Highest education level: Not on file  Occupational History   Occupation: disabled Curator   Tobacco Use   Smoking status: Never   Smokeless tobacco: Never  Vaping Use   Vaping status: Never Used  Substance and Sexual Activity   Alcohol use: Yes    Comment: social-2 beers/wk   Drug use: No   Sexual activity: Yes    Birth control/protection: None  Other Topics Concern   Not on file  Social History Narrative   Not on file   Social Drivers of Health  Financial Resource Strain: Low Risk  (02/14/2022)   Overall Financial Resource Strain (CARDIA)    Difficulty of Paying Living Expenses: Not hard at all  Food Insecurity: No Food Insecurity (05/19/2019)   Hunger Vital Sign    Worried About Running Out of Food in the Last Year: Never true    Ran Out of Food in the Last Year: Never true  Transportation Needs: No Transportation Needs (05/19/2019)   PRAPARE - Administrator, Civil Service (Medical): No    Lack of Transportation (Non-Medical): No  Physical Activity: Not on file  Stress: Not on file  Social Connections: Not on file   Outpatient Encounter Medications as of 06/19/2023   Medication Sig   acetaminophen  (TYLENOL ) 325 MG tablet Take 650 mg by mouth as needed for moderate pain or headache.   alfuzosin  (UROXATRAL ) 10 MG 24 hr tablet Take 1 tablet (10 mg total) by mouth at bedtime.   amLODipine  (NORVASC ) 10 MG tablet Take 1 tablet (10 mg total) by mouth daily.   aspirin  81 MG EC tablet Take 81 mg by mouth daily.   blood glucose meter kit and supplies KIT Dispense based on patient and insurance preference. Use up to four times daily as directed. (FOR ICD-10 E11.65)   blood glucose meter kit and supplies 1 each by Other route 4 (four) times daily. Dispense based on patient and insurance preference. Use up to four times daily as directed. (FOR ICD-10 E10.9, E11.9).   carvedilol  (COREG ) 12.5 MG tablet TAKE 1 TABLET(12.5 MG) BY MOUTH TWICE DAILY WITH A MEAL   Cholecalciferol (VITAMIN D3) 25 MCG (1000 UT) CAPS Take 2 capsules (2,000 Units total) by mouth daily. (Patient taking differently: Take 1,000 Units by mouth daily.)   Evolocumab  (REPATHA ) 140 MG/ML SOSY Inject 140 mg into the skin every 14 (fourteen) days.   fenofibrate  (TRICOR ) 48 MG tablet TAKE 1 TABLET(48 MG) BY MOUTH DAILY   glucose blood (ONETOUCH VERIO) test strip USE TO CHECK BLOOD SUGAR FOUR TIMES DAILY AS DIRECTED   hydrochlorothiazide  (HYDRODIURIL ) 25 MG tablet TAKE 1 TABLET(25 MG) BY MOUTH DAILY   lisinopril  (ZESTRIL ) 20 MG tablet TAKE 1 TABLET(20 MG) BY MOUTH TWICE DAILY   metFORMIN  (GLUCOPHAGE ) 1000 MG tablet Take 0.5 tablets (500 mg total) by mouth 2 (two) times daily with a meal.   Multiple Vitamin (MULTIVITAMIN) capsule Take 1 capsule by mouth daily.   nitroGLYCERIN  (NITROSTAT ) 0.4 MG SL tablet PLACE 1 TABLET UNDER TONGUE IF NEEDED FOR CHEST PAIN.Aundria Bloom OF 3 DOSES   pantoprazole (PROTONIX) 40 MG tablet Take 40 mg by mouth daily.   vitamin C (ASCORBIC ACID) 500 MG tablet Take 500 mg by mouth daily.   [DISCONTINUED] insulin  glargine (LANTUS  SOLOSTAR) 100 UNIT/ML Solostar Pen Inject 60 Units into the skin  at bedtime.   [DISCONTINUED] insulin  lispro (HUMALOG  KWIKPEN) 100 UNIT/ML KwikPen Inject 14-20 Units into the skin 3 (three) times daily.   [DISCONTINUED] Insulin  Pen Needle (PEN NEEDLES) 31G X 8 MM MISC Use to inject insulin  4 times daily   insulin  glargine (LANTUS  SOLOSTAR) 100 UNIT/ML Solostar Pen Inject 70 Units into the skin at bedtime.   insulin  lispro (HUMALOG  KWIKPEN) 100 UNIT/ML KwikPen Inject 14-20 Units into the skin 3 (three) times daily.   Insulin  Pen Needle (PEN NEEDLES) 31G X 8 MM MISC Use to inject insulin  4 times daily   No facility-administered encounter medications on file as of 06/19/2023.   ALLERGIES: Allergies  Allergen Reactions   Crestor [Rosuvastatin] Other (See  Comments)    myalgia   VACCINATION STATUS:  There is no immunization history on file for this patient.  Diabetes He presents for his follow-up diabetic visit. He has type 2 diabetes mellitus. Onset time: He was diagnosed at approximate age of 10 years. His disease course has been worsening. There are no hypoglycemic associated symptoms. Pertinent negatives for hypoglycemia include no confusion, pallor or seizures. There are no diabetic associated symptoms. Pertinent negatives for diabetes include no fatigue, no polydipsia, no polyphagia, no polyuria and no weakness. There are no hypoglycemic complications. Symptoms are stable. Diabetic complications include heart disease, nephropathy and peripheral neuropathy. Risk factors for coronary artery disease include dyslipidemia, diabetes mellitus, hypertension, male sex, sedentary lifestyle and obesity. Current diabetic treatment includes intensive insulin  program and oral agent (monotherapy). He is compliant with treatment most of the time. His weight is fluctuating minimally. He is following a generally healthy diet. When asked about meal planning, he reported none. He has had a previous visit with a dietitian. He participates in exercise intermittently. His home blood  glucose trend is fluctuating dramatically. His overall blood glucose range is >200 mg/dl. (He presents today with his CGM and logs showing above target glycemic profile overall.  His POCT A1c today is 7.6% increasing from last visit of 7%.  He notes his diet is about the same but he has not been as active due to the colder weather.  Analysis of his CGM shows TIR 24%, TAR 76%, TBR 0% with a GMI of 8.9%.) An ACE inhibitor/angiotensin II receptor blocker is being taken. He does not see a podiatrist.Eye exam is current.  Hyperlipidemia This is a chronic problem. The current episode started more than 1 year ago. The problem is uncontrolled. Recent lipid tests were reviewed and are variable. Exacerbating diseases include chronic renal disease, diabetes and obesity. Factors aggravating his hyperlipidemia include beta blockers and thiazides. Pertinent negatives include no myalgias. Current antihyperlipidemic treatment includes statins and fibric acid derivatives. The current treatment provides moderate improvement of lipids. There are no compliance problems.  Risk factors for coronary artery disease include diabetes mellitus, dyslipidemia, hypertension, male sex, a sedentary lifestyle, obesity and family history.    Review of systems  Constitutional: + increasing body weight,  current Body mass index is 32.04 kg/m. , no fatigue, no subjective hyperthermia, no subjective hypothermia Eyes: no blurry vision, no xerophthalmia ENT: no sore throat, no nodules palpated in throat, no dysphagia/odynophagia, no hoarseness Cardiovascular: no chest pain, no shortness of breath, no palpitations, no leg swelling Respiratory: no cough, no shortness of breath Gastrointestinal: no nausea/vomiting/diarrhea Genitourinary: + nocturia-seeing urology for urinary retention and elevated PSA Musculoskeletal: no muscle/joint aches Skin: no rashes, no hyperemia Neurological: no tremors, no numbness, no tingling, no  dizziness Psychiatric: no depression, no anxiety   Objective:    BP (!) 142/70 (BP Location: Right Arm, Patient Position: Sitting, Cuff Size: Large) Comment: Retake with a manuel cuff , Patient follows with Dr.Golding for his HTN. Aurel Nguyen made aware  Pulse 81   Ht 5\' 7"  (1.702 m)   Wt 204 lb 9.6 oz (92.8 kg)   BMI 32.04 kg/m   Wt Readings from Last 3 Encounters:  06/19/23 204 lb 9.6 oz (92.8 kg)  03/18/23 197 lb 9.6 oz (89.6 kg)  12/12/22 191 lb 3.2 oz (86.7 kg)    BP Readings from Last 3 Encounters:  06/19/23 (!) 142/70  06/19/23 (!) 166/71  03/18/23 130/74     Physical Exam- Limited  Constitutional:  Body  mass index is 32.04 kg/m. , not in acute distress, normal state of mind Eyes:  EOMI, no exophthalmos Musculoskeletal: no gross deformities, strength intact in all four extremities, no gross restriction of joint movements Skin:  no rashes, no hyperemia Neurological: no tremor with outstretched hands   Diabetic Foot Exam - Simple   No data filed     Results for orders placed or performed in visit on 06/19/23  HgB A1c   Collection Time: 06/19/23  3:37 PM  Result Value Ref Range   Hemoglobin A1C 7.6 (A) 4.0 - 5.6 %   HbA1c POC (<> result, manual entry)     HbA1c, POC (prediabetic range)     HbA1c, POC (controlled diabetic range)     Diabetic Labs (most recent): Lab Results  Component Value Date   HGBA1C 7.6 (A) 06/19/2023   HGBA1C 7.0 03/18/2023   HGBA1C 6.9 (A) 12/12/2022   MICROALBUR 150 mg/L 03/18/2023   MICROALBUR 150 04/18/2021   Lipid Panel     Component Value Date/Time   CHOL 202 (H) 03/12/2023 1006   TRIG 381 (H) 03/12/2023 1006   HDL 36 (L) 03/12/2023 1006   CHOLHDL 5.6 (H) 03/12/2023 1006   CHOLHDL 7.0 12/29/2012 0438   VLDL UNABLE TO CALCULATE IF TRIGLYCERIDE OVER 400 mg/dL 40/98/1191 4782   LDLCALC 102 (H) 03/12/2023 1006       Latest Ref Rng & Units 03/12/2023   10:06 AM 12/13/2021    9:43 AM 04/12/2021   10:08 AM  CMP  Glucose 70 -  99 mg/dL 956  213  086   BUN 8 - 27 mg/dL 20  26  19    Creatinine 0.76 - 1.27 mg/dL 5.78  4.69  6.29   Sodium 134 - 144 mmol/L 139  138  136   Potassium 3.5 - 5.2 mmol/L 5.1  4.6  4.7   Chloride 96 - 106 mmol/L 102  101  99   CO2 20 - 29 mmol/L 22  18  24    Calcium  8.6 - 10.2 mg/dL 9.8  9.9  9.4   Total Protein 6.0 - 8.5 g/dL 6.7  6.4  6.9   Total Bilirubin 0.0 - 1.2 mg/dL 0.2  0.2  0.3   Alkaline Phos 44 - 121 IU/L 75  79  88   AST 0 - 40 IU/L 17  19  16    ALT 0 - 44 IU/L 21  19  18      Assessment & Plan:   1) Type 2 diabetes mellitus with vascular disease (HCC)  His diabetes is complicated by coronary artery disease status post CABG in 2006 and stent placement in 2014.   He presents today with his CGM and logs showing above target glycemic profile overall.  His POCT A1c today is 7.6% increasing from last visit of 7%.  He notes his diet is about the same but he has not been as active due to the colder weather.  Analysis of his CGM shows TIR 24%, TAR 76%, TBR 0% with a GMI of 8.9%.  -Recent labs reviewed.  His kidneys are slowly declining and he did have mild microalbuminuria today.  He is advised to drink plenty of water  throughout the day (something he admits he doesn't do enough of).  Will recheck kidney function prior to next visit.  - Lucretia Sabot remains at a high risk for more acute and chronic complications of diabetes which include CAD, CVA, CKD, retinopathy, and neuropathy. These are all discussed in detail  with the patient.  - Nutritional counseling repeated at each appointment due to patients tendency to fall back in to old habits.  - The patient admits there is a room for improvement in their diet and drink choices. -  Suggestion is made for the patient to avoid simple carbohydrates from their diet including Cakes, Sweet Desserts / Pastries, Ice Cream, Soda (diet and regular), Sweet Tea, Candies, Chips, Cookies, Sweet Pastries, Store Bought Juices, Alcohol in Excess of 1-2  drinks a day, Artificial Sweeteners, Coffee Creamer, and "Sugar-free" Products. This will help patient to have stable blood glucose profile and potentially avoid unintended weight gain.   - I encouraged the patient to switch to unprocessed or minimally processed complex starch and increased protein intake (animal or plant source), fruits, and vegetables.   - Patient is advised to stick to a routine mealtimes to eat 3 meals a day and avoid unnecessary snacks (to snack only to correct hypoglycemia).  - I have approached patient with the following individualized plan to manage diabetes and patient agrees.  -He is advised to increase his Lantus  to 70 units SQ nightly and continue Novolog  14-20 units TID with meals if glucose is above 90 and he is eating (Specific instructions on how to titrate insulin  dosage based on glucose readings given to patient in writing)  He can continue Metformin  500 mg po twice daily with meals (can cut his current 1000 mg tabs in half) to see if his kidneys rebound.    -He is encouraged to continue to monitor glucose 4 times daily, before meals and before bed, and call the clinic if he has readings less than 70 or greater than 300 for 3 tests in a row.  He is advised to continue using his CGM device.  2) Lipids/HPL:  His most recent lipid panel from 03/12/23 shows uncontrolled LDL at 102 and elevated triglycerides at 381 (worsening).  He notes he does not tolerate statins and he is having similar side effects with his Repatha  so he stopped taking it about 2 weeks ago.  He is advised to reach out to his cardiologist to let them know of the side effects and see if there are other options.  He is advised to avoid fried foods and butter.    3) Hypertension:  His blood pressure is controlled to target.  He is advised to continue Coreg  12.5 mg po twice daily, Diltiazem  120 mg po daily, HCTZ 25 mg po daily, and Lisinopril  20 mg po twice daily.    4) Weight management- His Body mass  index is 32.04 kg/m.  He will benefit from moderate weight loss.  Exercise, carbs management discussed with him.  I advised patient to maintain close follow up with Dr. Minus Amel for primary care needs.  He is  also advised to maintain close follow-up with his cardiologist.     I spent  43  minutes in the care of the patient today including review of labs from CMP, Lipids, Thyroid  Function, Hematology (current and previous including abstractions from other facilities); face-to-face time discussing  his blood glucose readings/logs, discussing hypoglycemia and hyperglycemia episodes and symptoms, medications doses, his options of short and long term treatment based on the latest standards of care / guidelines;  discussion about incorporating lifestyle medicine;  and documenting the encounter. Risk reduction counseling performed per USPSTF guidelines to reduce obesity and cardiovascular risk factors.     Please refer to Patient Instructions for Blood Glucose Monitoring and Insulin /Medications Dosing Guide"  in media tab for additional information. Please  also refer to " Patient Self Inventory" in the Media  tab for reviewed elements of pertinent patient history.  Francis Hall participated in the discussions, expressed understanding, and voiced agreement with the above plans.  All questions were answered to his satisfaction. he is encouraged to contact clinic should he have any questions or concerns prior to his return visit.   Follow up plan: Return in about 3 months (around 09/17/2023) for Diabetes F/U with A1c in office, Bring meter and logs, Previsit labs.  Hulon Magic, [facility] [address] [place], [place] [address] Phone: [phone] Fax: [phone]  06/19/2023, 4:21 PM

## 2023-06-19 NOTE — Progress Notes (Signed)
06/19/2023 2:27 PM   Francis Hall 1955-11-07 [id number]  Referring provider: Assunta Found, MD [address] [place],  [place] [address]  Elevated PSA   HPI: Francis Hall is a 20JY here for evaluation of elevated PSA> He was last seen by Dr. Pete Hall in 2022. PSA increased to 8.2 from 4.8 a year ago. IPSS 9 QOl 2 on no BPH therapy. Nocturia 3x. He denies straining to urinate. Urine stream fair   PMH: Past Medical History:  Diagnosis Date   Atrial fibrillation (HCC)    CAD (coronary artery disease)    [facility]   CHF (congestive heart failure) (HCC) 11/08/2010   2D Echo EF=>55%   Diabetes mellitus    insulin   Diverticulosis    Dyspnea    Family hx of colon cancer    GERD (gastroesophageal reflux disease)    HTN (hypertension)    Hyperlipemia    Hyperplastic colon polyp 07/16/07   30cm    Myocardial infarction (HCC) 12/2012   OSA (obstructive sleep apnea) 12/21/2007   sleep study perform REM REM and NREM was 95.0%, AHI of 12.31hr and RDI of 12.4hr    Surgical History: Past Surgical History:  Procedure Laterality Date   CARDIAC CATHETERIZATION  2006 2011   2 stents   CATARACT EXTRACTION W/PHACO Left 12/03/2013   Procedure: CATARACT EXTRACTION PHACO AND INTRAOCULAR LENS PLACEMENT (IOC);  Surgeon: Francis Payor, MD;  Location: [facility];  Service: Ophthalmology;  Laterality: Left;  CDE:7.07   CATARACT EXTRACTION W/PHACO Right 12/14/2013   Procedure: CATARACT EXTRACTION PHACO AND INTRAOCULAR LENS PLACEMENT (IOC);  Surgeon: Francis Payor, MD;  Location: [facility];  Service: Ophthalmology;  Laterality: Right;  CDE 7.84    COLONOSCOPY  07/2007   Dr Francis Hall: hyperplastic polyp 30cm   COLONOSCOPY N/A 08/12/2012   Procedure: COLONOSCOPY;  Surgeon: Francis Bali, MD;  Location: [facility];  Service: Endoscopy;  Laterality: N/A;  8:30   COLONOSCOPY N/A 11/11/2017   Dr. Darrick Hall: multiple tubular adenomas, next surveillance in 2022.    COLONOSCOPY WITH PROPOFOL N/A 01/10/2021    Procedure: COLONOSCOPY WITH PROPOFOL;  Surgeon: Francis Bal, DO;  Location: [facility];  Service: Endoscopy;  Laterality: N/A;  8:45 / ASA II   CORONARY ARTERY BYPASS GRAFT  06/2004   x2 performed for 40% left main stenosis, 80% proximal LAD stenosis of the first diagonal artery, and 50% left circumfles artery priximal stenosis.LIMA to LAD and free radial artery to circumflex coronary artery   LEFT HEART CATHETERIZATION WITH CORONARY/GRAFT ANGIOGRAM  12/29/2012   Procedure: LEFT HEART CATHETERIZATION WITH Isabel Caprice;  Surgeon: Francis Lex, MD;  Location: [facility];  Service: Cardiovascular;;   POLYPECTOMY  11/11/2017   Procedure: POLYPECTOMY;  Surgeon: Francis Bali, MD;  Location: [facility];  Service: Endoscopy;;  ascending colon, transverse, splenic flexure, sigmoid   POLYPECTOMY  01/10/2021   Procedure: POLYPECTOMY;  Surgeon: Francis Bal, DO;  Location: [facility];  Service: Endoscopy;;   TONSILLECTOMY  1962    Home Medications:  Allergies as of 06/19/2023       Reactions   Crestor [rosuvastatin] Other (See Comments)   myalgia        Medication List        Accurate as of June 19, 2023  2:27 PM. If you have any questions, ask your nurse or doctor.          acetaminophen 325 MG tablet Commonly known as: TYLENOL Take 650  mg by mouth as needed for moderate pain or headache.   amLODipine 10 MG tablet Commonly known as: NORVASC Take 1 tablet (10 mg total) by mouth daily.   ascorbic acid 500 MG tablet Commonly known as: VITAMIN C Take 500 mg by mouth daily.   aspirin EC 81 MG tablet Take 81 mg by mouth daily.   blood glucose meter kit and supplies 1 each by Other route 4 (four) times daily. Dispense based on patient and insurance preference. Use up to four times daily as directed. (FOR ICD-10 E10.9, E11.9).   blood glucose meter kit and supplies Kit Dispense based on patient and insurance preference. Use up to four times daily as  directed. (FOR ICD-10 E11.65)   carvedilol 12.5 MG tablet Commonly known as: COREG TAKE 1 TABLET(12.5 MG) BY MOUTH TWICE DAILY WITH A MEAL   fenofibrate 48 MG tablet Commonly known as: TRICOR TAKE 1 TABLET(48 MG) BY MOUTH DAILY   hydrochlorothiazide 25 MG tablet Commonly known as: HYDRODIURIL TAKE 1 TABLET(25 MG) BY MOUTH DAILY   insulin lispro 100 UNIT/ML KwikPen Commonly known as: HumaLOG KwikPen Inject 14-20 Units into the skin 3 (three) times daily.   Lantus SoloStar 100 UNIT/ML Solostar Pen Generic drug: insulin glargine Inject 60 Units into the skin at bedtime.   lisinopril 20 MG tablet Commonly known as: ZESTRIL TAKE 1 TABLET(20 MG) BY MOUTH TWICE DAILY   metFORMIN 1000 MG tablet Commonly known as: GLUCOPHAGE Take 0.5 tablets (500 mg total) by mouth 2 (two) times daily with a meal.   multivitamin capsule Take 1 capsule by mouth daily.   nitroGLYCERIN 0.4 MG SL tablet Commonly known as: Nitrostat PLACE 1 TABLET UNDER TONGUE IF NEEDED FOR CHEST PAIN.Jake Seats OF 3 DOSES   OneTouch Verio test strip Generic drug: glucose blood USE TO CHECK BLOOD SUGAR FOUR TIMES DAILY AS DIRECTED   pantoprazole 40 MG tablet Commonly known as: PROTONIX Take 40 mg by mouth daily.   Pen Needles 31G X 8 MM Misc Use to inject insulin 4 times daily   Repatha 140 MG/ML Sosy Generic drug: Evolocumab Inject 140 mg into the skin every 14 (fourteen) days.   Vitamin D3 25 MCG (1000 UT) Caps Take 2 capsules (2,000 Units total) by mouth daily. What changed: how much to take        Allergies:  Allergies  Allergen Reactions   Crestor [Rosuvastatin] Other (See Comments)    myalgia    Family History: Family History  Problem Relation Age of Onset   Colon cancer Father        14's?   Lung cancer Father    Coronary artery disease Father 4       MI   Arthritis Other    Heart disease Other    Diabetes Other     Social History:  reports that he has never smoked. He has never  used smokeless tobacco. He reports current alcohol use. He reports that he does not use drugs.  ROS: All other review of systems were reviewed and are negative except what is noted above in HPI  Physical Exam: BP (!) 166/71   Pulse 78   Constitutional:  Alert and oriented, No acute distress. HEENT: [place] AT, moist mucus membranes.  Trachea midline, no masses. Cardiovascular: No clubbing, cyanosis, or edema. Respiratory: Normal respiratory effort, no increased work of breathing. GI: Abdomen is soft, nontender, nondistended, no abdominal masses GU: No CVA tenderness. Circumcised phallus. No masses/lesions on penis, testis, scrotum. Prostate 40g smooth  no nodules no induration.  Lymph: No cervical or inguinal lymphadenopathy. Skin: No rashes, bruises or suspicious lesions. Neurologic: Grossly intact, no focal deficits, moving all 4 extremities. Psychiatric: Normal mood and affect.  Laboratory Data: Lab Results  Component Value Date   WBC 6.6 12/30/2012   HGB 12.6 (L) 11/27/2013   HCT 36.1 (L) 11/27/2013   MCV 87.9 12/30/2012   PLT 168 12/30/2012    Lab Results  Component Value Date   CREATININE 1.34 (H) 03/12/2023    No results found for: "PSA"  No results found for: "TESTOSTERONE"  Lab Results  Component Value Date   HGBA1C 7.0 03/18/2023    Urinalysis    Component Value Date/Time   APPEARANCEUR Clear 04/11/2021 1541   GLUCOSEU Negative 04/11/2021 1541   BILIRUBINUR Negative 04/11/2021 1541   PROTEINUR 2+ (A) 04/11/2021 1541   NITRITE Negative 04/11/2021 1541   LEUKOCYTESUR Negative 04/11/2021 1541    Lab Results  Component Value Date   LABMICR See below: 04/11/2021   WBCUA None seen 04/11/2021   LABEPIT 0-10 04/11/2021   MUCUS Present 04/11/2021   BACTERIA None seen 04/11/2021    Pertinent Imaging:  No results found for this or any previous visit.  No results found for this or any previous visit.  No results found for this or any previous visit.  No  results found for this or any previous visit.  No results found for this or any previous visit.  No results found for this or any previous visit.  No results found for this or any previous visit.  No results found for this or any previous visit.   Assessment & Plan:    1. Elevated PSA (Primary) The patient and I talked about etiologies of elevated PSA.  We discussed the possible relationship between elevated PSA and prostate cancer, BPH, prostatitis, infection trauma and recent ejaculations.  I will see him back in 3 months with a PSA - Urinalysis, Routine w reflex microscopic  2. Incomplete bladder emptying Uroxatral 10mg  qhs - BLADDER SCAN AMB NON-IMAGING   No follow-ups on file.  Wilkie Aye, MD  [facility] [place]

## 2023-06-20 LAB — URINALYSIS, ROUTINE W REFLEX MICROSCOPIC
Bilirubin, UA: NEGATIVE
Glucose, UA: NEGATIVE
Ketones, UA: NEGATIVE
Leukocytes,UA: NEGATIVE
Nitrite, UA: NEGATIVE
RBC, UA: NEGATIVE
Specific Gravity, UA: 1.025 (ref 1.005–1.030)
Urobilinogen, Ur: 1 mg/dL (ref 0.2–1.0)
pH, UA: 6 (ref 5.0–7.5)

## 2023-06-20 LAB — MICROSCOPIC EXAMINATION
Bacteria, UA: NONE SEEN
RBC, Urine: NONE SEEN /[HPF] (ref 0–2)

## 2023-06-25 ENCOUNTER — Encounter: Payer: Self-pay | Admitting: Urology

## 2023-06-25 NOTE — Patient Instructions (Signed)

## 2023-06-26 DIAGNOSIS — E1165 Type 2 diabetes mellitus with hyperglycemia: Secondary | ICD-10-CM | POA: Diagnosis not present

## 2023-07-03 ENCOUNTER — Other Ambulatory Visit: Payer: Self-pay | Admitting: Cardiovascular Disease

## 2023-07-27 DIAGNOSIS — E1165 Type 2 diabetes mellitus with hyperglycemia: Secondary | ICD-10-CM | POA: Diagnosis not present

## 2023-08-07 DIAGNOSIS — E1165 Type 2 diabetes mellitus with hyperglycemia: Secondary | ICD-10-CM | POA: Diagnosis not present

## 2023-09-03 ENCOUNTER — Other Ambulatory Visit: Payer: Self-pay | Admitting: Cardiovascular Disease

## 2023-09-04 ENCOUNTER — Other Ambulatory Visit: Payer: Self-pay | Admitting: Cardiovascular Disease

## 2023-09-04 ENCOUNTER — Telehealth: Payer: Self-pay | Admitting: Cardiovascular Disease

## 2023-09-04 MED ORDER — LISINOPRIL 20 MG PO TABS: 20.0000 mg | ORAL_TABLET | Freq: Two times a day (BID) | ORAL | 0 refills | Status: DC

## 2023-09-04 MED ORDER — HYDROCHLOROTHIAZIDE 25 MG PO TABS: ORAL_TABLET | ORAL | 0 refills | Status: DC

## 2023-09-04 NOTE — Telephone Encounter (Signed)
*  STAT* If patient is at the pharmacy, call can be transferred to refill team.   1. Which medications need to be refilled? (please list name of each medication and dose if known) hydrochlorothiazide (HYDRODIURIL) 25 MG tablet   lisinopril (ZESTRIL) 20 MG tablet   2. Which pharmacy/location (including street and city if local pharmacy) is medication to be sent to? [id number] - [place], [place] - [address]   3. Do they need a 30 day or 90 day supply? 90

## 2023-09-04 NOTE — Telephone Encounter (Signed)
 Pt's medications were sent to pt's pharmacy as requested. Confirmation received.

## 2023-09-07 DIAGNOSIS — E1165 Type 2 diabetes mellitus with hyperglycemia: Secondary | ICD-10-CM | POA: Diagnosis not present

## 2023-09-17 ENCOUNTER — Other Ambulatory Visit: Payer: Medicare Other

## 2023-09-17 DIAGNOSIS — R972 Elevated prostate specific antigen [PSA]: Secondary | ICD-10-CM | POA: Diagnosis not present

## 2023-09-17 DIAGNOSIS — E1159 Type 2 diabetes mellitus with other circulatory complications: Secondary | ICD-10-CM | POA: Diagnosis not present

## 2023-09-18 LAB — COMPREHENSIVE METABOLIC PANEL WITH GFR
ALT: 28 IU/L (ref 0–44)
AST: 23 IU/L (ref 0–40)
Albumin: 4.5 g/dL (ref 3.9–4.9)
Alkaline Phosphatase: 76 IU/L (ref 44–121)
BUN/Creatinine Ratio: 18 (ref 10–24)
BUN: 27 mg/dL (ref 8–27)
Bilirubin Total: 0.4 mg/dL (ref 0.0–1.2)
CO2: 24 mmol/L (ref 20–29)
Calcium: 9.6 mg/dL (ref 8.6–10.2)
Chloride: 101 mmol/L (ref 96–106)
Creatinine, Ser: 1.47 mg/dL — ABNORMAL HIGH (ref 0.76–1.27)
Globulin, Total: 2.4 g/dL (ref 1.5–4.5)
Glucose: 64 mg/dL — ABNORMAL LOW (ref 70–99)
Potassium: 4.8 mmol/L (ref 3.5–5.2)
Sodium: 139 mmol/L (ref 134–144)
Total Protein: 6.9 g/dL (ref 6.0–8.5)
eGFR: 52 mL/min/{1.73_m2} — ABNORMAL LOW (ref >59)

## 2023-09-18 LAB — PSA, TOTAL AND FREE
PSA, Free Pct: 46.3 %
PSA, Free: 2.64 ng/mL
Prostate Specific Ag, Serum: 5.7 ng/mL — ABNORMAL HIGH (ref 0.0–4.0)

## 2023-09-20 ENCOUNTER — Ambulatory Visit: Payer: Medicare Other | Admitting: Urology

## 2023-09-24 ENCOUNTER — Encounter: Payer: Self-pay | Admitting: Nurse Practitioner

## 2023-09-24 ENCOUNTER — Ambulatory Visit (INDEPENDENT_AMBULATORY_CARE_PROVIDER_SITE_OTHER): Payer: Medicare Other | Admitting: Nurse Practitioner

## 2023-09-24 VITALS — BP 134/68 | HR 72 | Ht 67.0 in | Wt 199.4 lb

## 2023-09-24 DIAGNOSIS — Z794 Long term (current) use of insulin: Secondary | ICD-10-CM

## 2023-09-24 DIAGNOSIS — E782 Mixed hyperlipidemia: Secondary | ICD-10-CM | POA: Diagnosis not present

## 2023-09-24 DIAGNOSIS — I10 Essential (primary) hypertension: Secondary | ICD-10-CM | POA: Diagnosis not present

## 2023-09-24 DIAGNOSIS — E1159 Type 2 diabetes mellitus with other circulatory complications: Secondary | ICD-10-CM | POA: Diagnosis not present

## 2023-09-24 DIAGNOSIS — Z7984 Long term (current) use of oral hypoglycemic drugs: Secondary | ICD-10-CM

## 2023-09-24 LAB — POCT GLYCOSYLATED HEMOGLOBIN (HGB A1C): HbA1c, POC (controlled diabetic range): 7.2 % — AB (ref 0.0–7.0)

## 2023-09-24 NOTE — Progress Notes (Signed)
 09/24/2023   Endocrinology follow-up note   Subjective:    Patient ID: Francis Hall, male    DOB: 1955/11/12,    Past Medical History:  Diagnosis Date   Atrial fibrillation (HCC)    CAD (coronary artery disease)    [facility]   CHF (congestive heart failure) (HCC) 11/08/2010   2D Echo EF=>55%   Diabetes mellitus    insulin    Diverticulosis    Dyspnea    Family hx of colon cancer    GERD (gastroesophageal reflux disease)    HTN (hypertension)    Hyperlipemia    Hyperplastic colon polyp 07/16/07   30cm    Myocardial infarction (HCC) 12/2012   OSA (obstructive sleep apnea) 12/21/2007   sleep study perform REM REM and NREM was 95.0%, AHI of 12.31hr and RDI of 12.4hr   Past Surgical History:  Procedure Laterality Date   CARDIAC CATHETERIZATION  2006 2011   2 stents   CATARACT EXTRACTION W/PHACO Left 12/03/2013   Procedure: CATARACT EXTRACTION PHACO AND INTRAOCULAR LENS PLACEMENT (IOC);  Surgeon: Anner Kill, MD;  Location: [facility];  Service: Ophthalmology;  Laterality: Left;  CDE:7.07   CATARACT EXTRACTION W/PHACO Right 12/14/2013   Procedure: CATARACT EXTRACTION PHACO AND INTRAOCULAR LENS PLACEMENT (IOC);  Surgeon: Anner Kill, MD;  Location: [facility];  Service: Ophthalmology;  Laterality: Right;  CDE 7.84    COLONOSCOPY  07/2007   Dr Larrie Po: hyperplastic polyp 30cm   COLONOSCOPY N/A 08/12/2012   Procedure: COLONOSCOPY;  Surgeon: Alyce Jubilee, MD;  Location: [facility];  Service: Endoscopy;  Laterality: N/A;  8:30   COLONOSCOPY N/A 11/11/2017   Dr. Nolene Baumgarten: multiple tubular adenomas, next surveillance in 2022.    COLONOSCOPY WITH PROPOFOL  N/A 01/10/2021   Procedure: COLONOSCOPY WITH PROPOFOL ;  Surgeon: Vinetta Greening, DO;  Location: [facility];  Service: Endoscopy;  Laterality: N/A;  8:45 / ASA II   CORONARY ARTERY BYPASS GRAFT  06/2004   x2 performed for 40% left main stenosis, 80% proximal LAD stenosis of the first diagonal artery, and 50% left circumfles  artery priximal stenosis.LIMA to LAD and free radial artery to circumflex coronary artery   LEFT HEART CATHETERIZATION WITH CORONARY/GRAFT ANGIOGRAM  12/29/2012   Procedure: LEFT HEART CATHETERIZATION WITH Estella Helling;  Surgeon: Arleen Lacer, MD;  Location: [facility] C[facility];  Service: Cardiovascular;;   POLYPECTOMY  11/11/2017   Procedure: POLYPECTOMY;  Surgeon: Alyce Jubilee, MD;  Location: [facility];  Service: Endoscopy;;  ascending colon, transverse, splenic flexure, sigmoid   POLYPECTOMY  01/10/2021   Procedure: POLYPECTOMY;  Surgeon: Vinetta Greening, DO;  Location: [facility];  Service: Endoscopy;;   TONSILLECTOMY  1962   Social History   Socioeconomic History   Marital status: Married    Spouse name: Not on file   Number of children: 2   Years of education: 12   Highest education level: Not on file  Occupational History   Occupation: disabled Curator   Tobacco Use   Smoking status: Never   Smokeless tobacco: Never  Vaping Use   Vaping status: Never Used  Substance and Sexual Activity   Alcohol use: Yes    Comment: social-2 beers/wk   Drug use: No   Sexual activity: Yes    Birth control/protection: None  Other Topics Concern   Not on file  Social History Narrative   Not on file   Social Drivers of Health  Financial Resource Strain: Low Risk  (02/14/2022)   Overall Financial Resource Strain (CARDIA)    Difficulty of Paying Living Expenses: Not hard at all  Food Insecurity: No Food Insecurity (05/19/2019)   Hunger Vital Sign    Worried About Running Out of Food in the Last Year: Never true    Ran Out of Food in the Last Year: Never true  Transportation Needs: No Transportation Needs (05/19/2019)   PRAPARE - Administrator, Civil Service (Medical): No    Lack of Transportation (Non-Medical): No  Physical Activity: Not on file  Stress: Not on file  Social Connections: Not on file   Outpatient Encounter Medications as of 09/24/2023   Medication Sig   acetaminophen  (TYLENOL ) 325 MG tablet Take 650 mg by mouth as needed for moderate pain or headache.   alfuzosin  (UROXATRAL ) 10 MG 24 hr tablet Take 1 tablet (10 mg total) by mouth at bedtime.   amLODipine  (NORVASC ) 10 MG tablet Take 1 tablet (10 mg total) by mouth daily.   aspirin  81 MG EC tablet Take 81 mg by mouth daily.   blood glucose meter kit and supplies KIT Dispense based on patient and insurance preference. Use up to four times daily as directed. (FOR ICD-10 E11.65)   blood glucose meter kit and supplies 1 each by Other route 4 (four) times daily. Dispense based on patient and insurance preference. Use up to four times daily as directed. (FOR ICD-10 E10.9, E11.9).   carvedilol  (COREG ) 12.5 MG tablet TAKE 1 TABLET(12.5 MG) BY MOUTH TWICE DAILY WITH A MEAL   Cholecalciferol (VITAMIN D3) 25 MCG (1000 UT) CAPS Take 2 capsules (2,000 Units total) by mouth daily. (Patient taking differently: Take 1,000 Units by mouth daily.)   Evolocumab  (REPATHA ) 140 MG/ML SOSY Inject 140 mg into the skin every 14 (fourteen) days. (Patient not taking: Reported on 09/24/2023)   fenofibrate  (TRICOR ) 48 MG tablet TAKE 1 TABLET(48 MG) BY MOUTH DAILY   glucose blood (ONETOUCH VERIO) test strip USE TO CHECK BLOOD SUGAR FOUR TIMES DAILY AS DIRECTED   hydrochlorothiazide  (HYDRODIURIL ) 25 MG tablet TAKE 1 TABLET(25 MG) BY MOUTH DAILY   insulin  glargine (LANTUS  SOLOSTAR) 100 UNIT/ML Solostar Pen Inject 70 Units into the skin at bedtime.   insulin  lispro (HUMALOG  KWIKPEN) 100 UNIT/ML KwikPen Inject 14-20 Units into the skin 3 (three) times daily.   Insulin  Pen Needle (PEN NEEDLES) 31G X 8 MM MISC Use to inject insulin  4 times daily   lisinopril  (ZESTRIL ) 20 MG tablet Take 1 tablet (20 mg total) by mouth 2 (two) times daily.   metFORMIN  (GLUCOPHAGE ) 1000 MG tablet Take 0.5 tablets (500 mg total) by mouth 2 (two) times daily with a meal.   Multiple Vitamin (MULTIVITAMIN) capsule Take 1 capsule by mouth  daily.   nitroGLYCERIN  (NITROSTAT ) 0.4 MG SL tablet PLACE 1 TABLET UNDER TONGUE IF NEEDED FOR CHEST PAIN.Aundria Bloom OF 3 DOSES   pantoprazole (PROTONIX) 40 MG tablet Take 40 mg by mouth daily.   vitamin C (ASCORBIC ACID) 500 MG tablet Take 500 mg by mouth daily.   No facility-administered encounter medications on file as of 09/24/2023.   ALLERGIES: Allergies  Allergen Reactions   Crestor [Rosuvastatin] Other (See Comments)    myalgia   VACCINATION STATUS:  There is no immunization history on file for this patient.  Diabetes He presents for his follow-up diabetic visit. He has type 2 diabetes mellitus. Onset time: He was diagnosed at approximate age of 12 years. His disease course  has been stable. There are no hypoglycemic associated symptoms. Pertinent negatives for hypoglycemia include no confusion, pallor or seizures. There are no diabetic associated symptoms. Pertinent negatives for diabetes include no fatigue, no polydipsia, no polyphagia, no polyuria and no weakness. There are no hypoglycemic complications. Symptoms are stable. Diabetic complications include heart disease, nephropathy and peripheral neuropathy. Risk factors for coronary artery disease include dyslipidemia, diabetes mellitus, hypertension, male sex, sedentary lifestyle and obesity. Current diabetic treatment includes intensive insulin  program and oral agent (monotherapy). He is compliant with treatment most of the time. His weight is fluctuating minimally. He is following a generally healthy diet. When asked about meal planning, he reported none. He has had a previous visit with a dietitian. He participates in exercise intermittently. His home blood glucose trend is fluctuating minimally. His overall blood glucose range is 180-200 mg/dl. (He presents today with his CGM and logs showing slightly above target glycemic profile overall.  His POCT A1c today is 7.2% improving from last visit of 7.6%.  He notes his diet is about the same but  he sometimes does eat a little later in the evening than usual.  Analysis of his CGM shows TIR 41%, TAR 59%, TBR 0% with a GMI of 8%.) An ACE inhibitor/angiotensin II receptor blocker is being taken. He does not see a podiatrist.Eye exam is current.  Hyperlipidemia This is a chronic problem. The current episode started more than 1 year ago. The problem is uncontrolled. Recent lipid tests were reviewed and are variable. Exacerbating diseases include chronic renal disease, diabetes and obesity. Factors aggravating his hyperlipidemia include beta blockers and thiazides. Pertinent negatives include no myalgias. Current antihyperlipidemic treatment includes statins and fibric acid derivatives. The current treatment provides moderate improvement of lipids. There are no compliance problems.  Risk factors for coronary artery disease include diabetes mellitus, dyslipidemia, hypertension, male sex, a sedentary lifestyle, obesity and family history.    Review of systems  Constitutional: + fluctuating body weight,  current Body mass index is 31.23 kg/m. , no fatigue, no subjective hyperthermia, no subjective hypothermia Eyes: no blurry vision, no xerophthalmia ENT: no sore throat, no nodules palpated in throat, no dysphagia/odynophagia, no hoarseness Cardiovascular: no chest pain, + shortness of breath- with activity (has follow up with cardiology soon), no palpitations, no leg swelling Respiratory: no cough, no shortness of breath Gastrointestinal: no nausea/vomiting/diarrhea Genitourinary: + nocturia-seeing urology for urinary retention and elevated PSA Musculoskeletal: no muscle/joint aches Skin: no rashes, no hyperemia Neurological: no tremors, no numbness, no tingling, no dizziness Psychiatric: no depression, no anxiety   Objective:    BP 134/68   Pulse 72   Ht 5\' 7"  (1.702 m)   Wt 199 lb 6.4 oz (90.4 kg)   BMI 31.23 kg/m   Wt Readings from Last 3 Encounters:  09/24/23 199 lb 6.4 oz (90.4 kg)   06/19/23 204 lb 9.6 oz (92.8 kg)  03/18/23 197 lb 9.6 oz (89.6 kg)    BP Readings from Last 3 Encounters:  09/24/23 134/68  06/19/23 (!) 142/70  06/19/23 (!) 166/71     Physical Exam- Limited  Constitutional:  Body mass index is 31.23 kg/m. , not in acute distress, normal state of mind Eyes:  EOMI, no exophthalmos Musculoskeletal: no gross deformities, strength intact in all four extremities, no gross restriction of joint movements Skin:  no rashes, no hyperemia Neurological: no tremor with outstretched hands   Diabetic Foot Exam - Simple   Simple Foot Form Diabetic Foot exam was performed with the  following findings: Yes 09/24/2023  3:37 PM  Visual Inspection No deformities, no ulcerations, no other skin breakdown bilaterally: Yes Sensation Testing Intact to touch and monofilament testing bilaterally: Yes Pulse Check Posterior Tibialis and Dorsalis pulse intact bilaterally: Yes Comments     Results for orders placed or performed in visit on 09/24/23  HgB A1c   Collection Time: 09/24/23  3:21 PM  Result Value Ref Range   Hemoglobin A1C     HbA1c POC (<> result, manual entry)     HbA1c, POC (prediabetic range)     HbA1c, POC (controlled diabetic range) 7.2 (A) 0.0 - 7.0 %   Diabetic Labs (most recent): Lab Results  Component Value Date   HGBA1C 7.2 (A) 09/24/2023   HGBA1C 7.6 (A) 06/19/2023   HGBA1C 7.0 03/18/2023   MICROALBUR 150 mg/L 03/18/2023   MICROALBUR 150 04/18/2021   Lipid Panel     Component Value Date/Time   CHOL 202 (H) 03/12/2023 1006   TRIG 381 (H) 03/12/2023 1006   HDL 36 (L) 03/12/2023 1006   CHOLHDL 5.6 (H) 03/12/2023 1006   CHOLHDL 7.0 12/29/2012 0438   VLDL UNABLE TO CALCULATE IF TRIGLYCERIDE OVER 400 mg/dL 96/09/5407 8119   LDLCALC 102 (H) 03/12/2023 1006       Latest Ref Rng & Units 09/17/2023   12:56 PM 03/12/2023   10:06 AM 12/13/2021    9:43 AM  CMP  Glucose 70 - 99 mg/dL 64  147  829   BUN 8 - 27 mg/dL 27  20  26     Creatinine 0.76 - 1.27 mg/dL 5.62  1.30  8.65   Sodium 134 - 144 mmol/L 139  139  138   Potassium 3.5 - 5.2 mmol/L 4.8  5.1  4.6   Chloride 96 - 106 mmol/L 101  102  101   CO2 20 - 29 mmol/L 24  22  18    Calcium  8.6 - 10.2 mg/dL 9.6  9.8  9.9   Total Protein 6.0 - 8.5 g/dL 6.9  6.7  6.4   Total Bilirubin 0.0 - 1.2 mg/dL 0.4  0.2  0.2   Alkaline Phos 44 - 121 IU/L 76  75  79   AST 0 - 40 IU/L 23  17  19    ALT 0 - 44 IU/L 28  21  19      Assessment & Plan:   1) Type 2 diabetes mellitus with vascular disease (HCC)  His diabetes is complicated by coronary artery disease status post CABG in 2006 and stent placement in 2014.  He presents today with his CGM and logs showing slightly above target glycemic profile overall.  His POCT A1c today is 7.2% improving from last visit of 7.6%.  He notes his diet is about the same but he sometimes does eat a little later in the evening than usual.  Analysis of his CGM shows TIR 41%, TAR 59%, TBR 0% with a GMI of 8%.  -Recent labs reviewed.    - Lucretia Sabot remains at a high risk for more acute and chronic complications of diabetes which include CAD, CVA, CKD, retinopathy, and neuropathy. These are all discussed in detail with the patient.  - Nutritional counseling repeated at each appointment due to patients tendency to fall back in to old habits.  - The patient admits there is a room for improvement in their diet and drink choices. -  Suggestion is made for the patient to avoid simple carbohydrates from their diet including Cakes, Sweet  Desserts / Pastries, Ice Cream, Soda (diet and regular), Sweet Tea, Candies, Chips, Cookies, Sweet Pastries, Store Bought Juices, Alcohol in Excess of 1-2 drinks a day, Artificial Sweeteners, Coffee Creamer, and "Sugar-free" Products. This will help patient to have stable blood glucose profile and potentially avoid unintended weight gain.   - I encouraged the patient to switch to unprocessed or minimally processed complex  starch and increased protein intake (animal or plant source), fruits, and vegetables.   - Patient is advised to stick to a routine mealtimes to eat 3 meals a day and avoid unnecessary snacks (to snack only to correct hypoglycemia).  - I have approached patient with the following individualized plan to manage diabetes and patient agrees.  -Given his stable, slowly improving glycemic profile, no changes will be made to his regimen today.  He is advised to continue Lantus  70 units SQ nightly and continue Novolog  14-20 units TID with meals if glucose is above 90 and he is eating (Specific instructions on how to titrate insulin  dosage based on glucose readings given to patient in writing)  He can continue Metformin  500 mg po twice daily with meals (can cut his current 1000 mg tabs in half).  -He is encouraged to continue to monitor glucose 4 times daily, before meals and before bed, and call the clinic if he has readings less than 70 or greater than 300 for 3 tests in a row.  He is advised to continue using his CGM device.  2) Lipids/HPL:  His most recent lipid panel from 03/12/23 shows uncontrolled LDL at 102 and elevated triglycerides at 381 (worsening).  He notes he does not tolerate statins and he is having similar side effects with his Repatha  so he stopped taking it.  He is advised to reach out to his cardiologist to let them know of the side effects and see if there are other options.  He is advised to avoid fried foods and butter.    3) Hypertension:  His blood pressure is controlled to target.  He is advised to continue Coreg  12.5 mg po twice daily, Diltiazem  120 mg po daily, HCTZ 25 mg po daily, and Lisinopril  20 mg po twice daily.    4) Weight management- His Body mass index is 31.23 kg/m.  He will benefit from moderate weight loss.  Exercise, carbs management discussed with him.  I advised patient to maintain close follow up with Dr. Minus Amel for primary care needs.  He is  also advised  to maintain close follow-up with his cardiologist.     I spent  52  minutes in the care of the patient today including review of labs from CMP, Lipids, Thyroid  Function, Hematology (current and previous including abstractions from other facilities); face-to-face time discussing  his blood glucose readings/logs, discussing hypoglycemia and hyperglycemia episodes and symptoms, medications doses, his options of short and long term treatment based on the latest standards of care / guidelines;  discussion about incorporating lifestyle medicine;  and documenting the encounter. Risk reduction counseling performed per USPSTF guidelines to reduce obesity and cardiovascular risk factors.     Please refer to Patient Instructions for Blood Glucose Monitoring and Insulin /Medications Dosing Guide"  in media tab for additional information. Please  also refer to " Patient Self Inventory" in the Media  tab for reviewed elements of pertinent patient history.  Francis Hall participated in the discussions, expressed understanding, and voiced agreement with the above plans.  All questions were answered to his satisfaction. he  is encouraged to contact clinic should he have any questions or concerns prior to his return visit.   Follow up plan: Return in about 4 months (around 01/24/2024) for Diabetes F/U with A1c in office, Bring meter and logs.  Hulon Magic, [facility] [address] [place], [place] [address] Phone: [phone] Fax: [phone]  09/24/2023, 4:04 PM

## 2023-09-27 ENCOUNTER — Ambulatory Visit: Payer: Medicare Other | Admitting: Urology

## 2023-09-30 ENCOUNTER — Ambulatory Visit: Payer: Medicare Other | Admitting: Urology

## 2023-09-30 VITALS — BP 157/75 | HR 75

## 2023-09-30 DIAGNOSIS — R972 Elevated prostate specific antigen [PSA]: Secondary | ICD-10-CM | POA: Diagnosis not present

## 2023-09-30 DIAGNOSIS — R339 Retention of urine, unspecified: Secondary | ICD-10-CM

## 2023-09-30 LAB — URINALYSIS, ROUTINE W REFLEX MICROSCOPIC
Bilirubin, UA: NEGATIVE
Glucose, UA: NEGATIVE
Ketones, UA: NEGATIVE
Leukocytes,UA: NEGATIVE
Nitrite, UA: NEGATIVE
RBC, UA: NEGATIVE
Specific Gravity, UA: 1.02 (ref 1.005–1.030)
Urobilinogen, Ur: 1 mg/dL (ref 0.2–1.0)
pH, UA: 6.5 (ref 5.0–7.5)

## 2023-09-30 LAB — MICROSCOPIC EXAMINATION: Bacteria, UA: NONE SEEN

## 2023-09-30 LAB — BLADDER SCAN AMB NON-IMAGING: Scan Result: 171

## 2023-09-30 MED ORDER — ALFUZOSIN HCL ER 10 MG PO TB24: 10.0000 mg | ORAL_TABLET | Freq: Every day | ORAL | 11 refills | Status: DC

## 2023-09-30 NOTE — Progress Notes (Signed)
post void residual=171 

## 2023-09-30 NOTE — Progress Notes (Signed)
 09/30/2023 1:46 PM   Marcie Sever 02-Aug-1955 [id number]  Referring provider: Minus Amel, MD [address] [place],  [place] [address]  Followup elevated PSA   HPI: Mr Denner is a 30FA here for followup for elevated PSA and BPH. PSA decreased to 5.7 from 8.2. IPSS 8 QOL 2 on uroxatral  10mg  at bedtime. Nocturia decreased to 0-1x from 3-5x. Urine stream strong. No straining to urinate.    PMH: Past Medical History:  Diagnosis Date   Atrial fibrillation (HCC)    CAD (coronary artery disease)    [facility]   CHF (congestive heart failure) (HCC) 11/08/2010   2D Echo EF=>55%   Diabetes mellitus    insulin    Diverticulosis    Dyspnea    Family hx of colon cancer    GERD (gastroesophageal reflux disease)    HTN (hypertension)    Hyperlipemia    Hyperplastic colon polyp 07/16/07   30cm    Myocardial infarction (HCC) 12/2012   OSA (obstructive sleep apnea) 12/21/2007   sleep study perform REM REM and NREM was 95.0%, AHI of 12.31hr and RDI of 12.4hr    Surgical History: Past Surgical History:  Procedure Laterality Date   CARDIAC CATHETERIZATION  2006 2011   2 stents   CATARACT EXTRACTION W/PHACO Left 12/03/2013   Procedure: CATARACT EXTRACTION PHACO AND INTRAOCULAR LENS PLACEMENT (IOC);  Surgeon: Anner Kill, MD;  Location: [facility];  Service: Ophthalmology;  Laterality: Left;  CDE:7.07   CATARACT EXTRACTION W/PHACO Right 12/14/2013   Procedure: CATARACT EXTRACTION PHACO AND INTRAOCULAR LENS PLACEMENT (IOC);  Surgeon: Anner Kill, MD;  Location: [facility];  Service: Ophthalmology;  Laterality: Right;  CDE 7.84    COLONOSCOPY  07/2007   Dr Larrie Po: hyperplastic polyp 30cm   COLONOSCOPY N/A 08/12/2012   Procedure: COLONOSCOPY;  Surgeon: Alyce Jubilee, MD;  [facility];  Service: Endoscopy;  Laterality: N/A;  8:30   COLONOSCOPY N/A 11/11/2017   Dr. Nolene Baumgarten: multiple tubular adenomas, next surveillance in 2022.    COLONOSCOPY WITH PROPOFOL  N/A 01/10/2021    Procedure: COLONOSCOPY WITH PROPOFOL ;  Surgeon: Vinetta Greening, DO;  [facility];  Service: Endoscopy;  Laterality: N/A;  8:45 / ASA II   CORONARY ARTERY BYPASS GRAFT  06/2004   x2 performed for 40% left main stenosis, 80% proximal LAD stenosis of the first diagonal artery, and 50% left circumfles artery priximal stenosis.LIMA to LAD and free radial artery to circumflex coronary artery   LEFT HEART CATHETERIZATION WITH CORONARY/GRAFT ANGIOGRAM  12/29/2012   Procedure: LEFT HEART CATHETERIZATION WITH Estella Helling;  Surgeon: Arleen Lacer, MD;  Location: [facility] CATH LAB;  Service: Cardiovascular;;   POLYPECTOMY  11/11/2017   Procedure: POLYPECTOMY;  Surgeon: Alyce Jubilee, MD;  [facility];  Service: Endoscopy;;  ascending colon, transverse, splenic flexure, sigmoid   POLYPECTOMY  01/10/2021   Procedure: POLYPECTOMY;  Surgeon: Vinetta Greening, DO;  [facility];  Service: Endoscopy;;   TONSILLECTOMY  1962    Home Medications:  Allergies as of 09/30/2023       Reactions   Crestor [rosuvastatin] Other (See Comments)   myalgia        Medication List        Accurate as of September 30, 2023  1:46 PM. If you have any questions, ask your nurse or doctor.          acetaminophen  325 MG tablet Commonly known as: TYLENOL  Take 650 mg by mouth as  needed for moderate pain or headache.   alfuzosin  10 MG 24 hr tablet Commonly known as: UROXATRAL  Take 1 tablet (10 mg total) by mouth at bedtime.   amLODipine  10 MG tablet Commonly known as: NORVASC  Take 1 tablet (10 mg total) by mouth daily.   ascorbic acid 500 MG tablet Commonly known as: VITAMIN C Take 500 mg by mouth daily.   aspirin  EC 81 MG tablet Take 81 mg by mouth daily.   blood glucose meter kit and supplies 1 each by Other route 4 (four) times daily. Dispense based on patient and insurance preference. Use up to four times daily as directed. (FOR ICD-10 E10.9, E11.9).   blood  glucose meter kit and supplies Kit Dispense based on patient and insurance preference. Use up to four times daily as directed. (FOR ICD-10 E11.65)   carvedilol  12.5 MG tablet Commonly known as: COREG  TAKE 1 TABLET(12.5 MG) BY MOUTH TWICE DAILY WITH A MEAL   fenofibrate  48 MG tablet Commonly known as: TRICOR  TAKE 1 TABLET(48 MG) BY MOUTH DAILY   hydrochlorothiazide  25 MG tablet Commonly known as: HYDRODIURIL  TAKE 1 TABLET(25 MG) BY MOUTH DAILY   insulin  lispro 100 UNIT/ML KwikPen Commonly known as: HumaLOG  KwikPen Inject 14-20 Units into the skin 3 (three) times daily.   Lantus  SoloStar 100 UNIT/ML Solostar Pen Generic drug: insulin  glargine Inject 70 Units into the skin at bedtime.   lisinopril  20 MG tablet Commonly known as: ZESTRIL  Take 1 tablet (20 mg total) by mouth 2 (two) times daily.   metFORMIN  1000 MG tablet Commonly known as: GLUCOPHAGE  Take 0.5 tablets (500 mg total) by mouth 2 (two) times daily with a meal.   multivitamin capsule Take 1 capsule by mouth daily.   nitroGLYCERIN  0.4 MG SL tablet Commonly known as: Nitrostat  PLACE 1 TABLET UNDER TONGUE IF NEEDED FOR CHEST PAIN.Aundria Bloom OF 3 DOSES   OneTouch Verio test strip Generic drug: glucose blood USE TO CHECK BLOOD SUGAR FOUR TIMES DAILY AS DIRECTED   pantoprazole 40 MG tablet Commonly known as: PROTONIX Take 40 mg by mouth daily.   Pen Needles 31G X 8 MM Misc Use to inject insulin  4 times daily   Repatha  140 MG/ML Sosy Generic drug: Evolocumab  Inject 140 mg into the skin every 14 (fourteen) days.   Vitamin D3 25 MCG (1000 UT) Caps Take 2 capsules (2,000 Units total) by mouth daily. What changed: how much to take        Allergies:  Allergies  Allergen Reactions   Crestor [Rosuvastatin] Other (See Comments)    myalgia    Family History: Family History  Problem Relation Age of Onset   Colon cancer Father        36's?   Lung cancer Father    Coronary artery disease Father 70       MI    Arthritis Other    Heart disease Other    Diabetes Other     Social History:  reports that he has never smoked. He has never used smokeless tobacco. He reports current alcohol use. He reports that he does not use drugs.  ROS: All other review of systems were reviewed and are negative except what is noted above in HPI  Physical Exam: BP (!) 157/75   Pulse 75   Constitutional:  Alert and oriented, No acute distress. HEENT: [place] AT, moist mucus membranes.  Trachea midline, no masses. Cardiovascular: No clubbing, cyanosis, or edema. Respiratory: Normal respiratory effort, no increased work of breathing. GI: Abdomen  is soft, nontender, nondistended, no abdominal masses GU: No CVA tenderness.  Lymph: No cervical or inguinal lymphadenopathy. Skin: No rashes, bruises or suspicious lesions. Neurologic: Grossly intact, no focal deficits, moving all 4 extremities. Psychiatric: Normal mood and affect.  Laboratory Data: Lab Results  Component Value Date   WBC 6.6 12/30/2012   HGB 12.6 (L) 11/27/2013   HCT 36.1 (L) 11/27/2013   MCV 87.9 12/30/2012   PLT 168 12/30/2012    Lab Results  Component Value Date   CREATININE 1.47 (H) 09/17/2023    No results found for: "PSA"  No results found for: "TESTOSTERONE"  Lab Results  Component Value Date   HGBA1C 7.2 (A) 09/24/2023    Urinalysis    Component Value Date/Time   APPEARANCEUR Clear 06/19/2023 1421   GLUCOSEU Negative 06/19/2023 1421   BILIRUBINUR Negative 06/19/2023 1421   PROTEINUR 2+ (A) 06/19/2023 1421   NITRITE Negative 06/19/2023 1421   LEUKOCYTESUR Negative 06/19/2023 1421    Lab Results  Component Value Date   LABMICR See below: 06/19/2023   WBCUA 0-5 06/19/2023   LABEPIT 0-10 06/19/2023   MUCUS Present 04/11/2021   BACTERIA None seen 06/19/2023    Pertinent Imaging:  No results found for this or any previous visit.  No results found for this or any previous visit.  No results found for this or any  previous visit.  No results found for this or any previous visit.  No results found for this or any previous visit.  No results found for this or any previous visit.  No results found for this or any previous visit.  No results found for this or any previous visit.   Assessment & Plan:    1. Incomplete bladder emptying Continue uroxatral  10mg  qhs - BLADDER SCAN AMB NON-IMAGING  2. Elevated PSA (Primary) Followup 6 months with a PSA - Urinalysis, Routine w reflex microscopic   No follow-ups on file.  Johnie Nailer, MD  [facility]

## 2023-10-02 ENCOUNTER — Ambulatory Visit: Payer: Medicare Other | Attending: Cardiovascular Disease | Admitting: Cardiovascular Disease

## 2023-10-02 ENCOUNTER — Encounter: Payer: Self-pay | Admitting: Cardiovascular Disease

## 2023-10-02 VITALS — BP 174/72 | HR 70 | Ht 67.0 in | Wt 197.0 lb

## 2023-10-02 DIAGNOSIS — I10 Essential (primary) hypertension: Secondary | ICD-10-CM

## 2023-10-02 DIAGNOSIS — I25810 Atherosclerosis of coronary artery bypass graft(s) without angina pectoris: Secondary | ICD-10-CM | POA: Diagnosis not present

## 2023-10-02 DIAGNOSIS — Z794 Long term (current) use of insulin: Secondary | ICD-10-CM

## 2023-10-02 DIAGNOSIS — I5032 Chronic diastolic (congestive) heart failure: Secondary | ICD-10-CM | POA: Diagnosis not present

## 2023-10-02 DIAGNOSIS — E118 Type 2 diabetes mellitus with unspecified complications: Secondary | ICD-10-CM | POA: Diagnosis not present

## 2023-10-02 DIAGNOSIS — E782 Mixed hyperlipidemia: Secondary | ICD-10-CM

## 2023-10-02 DIAGNOSIS — E1159 Type 2 diabetes mellitus with other circulatory complications: Secondary | ICD-10-CM

## 2023-10-02 MED ORDER — OLMESARTAN MEDOXOMIL 40 MG PO TABS: 40.0000 mg | ORAL_TABLET | Freq: Every day | ORAL | 3 refills | Status: DC

## 2023-10-02 NOTE — Progress Notes (Signed)
 Cardiology Office Note    Date:  10/03/2023   ID:  Francis Hall, DOB 02-Dec-1955, MRN [id number]  PCP:  Minus Amel, MD  Cardiologist:   Luana Rumple, MD   Chief complaint: CAD  History of Present Illness:  Francis Hall is a 68 y.o. male with coronary artery disease and previous bypass surgery (2006 LIMA-LAD, free radial to circumflex), subsequent presentation with non-STEMI and PCI to the native RCA in 2014 ), diastolic heart failure, insulin -requiring diabetes mellitus, hypertension, mixed hyperlipidemia with severe hypertriglyceridemia, statin myopathy.  He is known to be a "clopidogrel nonresponder".  Recently has had a lot of problems with a variety of aches and pains and he has not taken his Repatha  for the last 2 weeks to see if this will lead to any improvement.  So far there has been no obvious change.  He does have a history of myopathy with multiple statins.  On the other hand he is tolerated Repatha  well for a number of years now.  He does not have any problems with chest pain or shortness of breath with usual activity.  He does not exercise regularly.  He has not had orthopnea, PND, palpitations, dizziness, syncope, focal neurological complaints.  His most recent lipid profile from last October showed LDL cholesterol 102, HDL 36, triglycerides 381 (on Repatha ).  He remains mildly obese with a BMI just under 31.  Diabetes control is fair with a hemoglobin A1c of 7.2%.  His blood pressure was initially quite high at 174/72 when he first checked in but improved substantially when rechecked 10 minutes later at 147/70.  He reports that at home his systolic blood pressure is typically in the high 130s or low 140s.  Creatinine is abnormal, most recently 1.47 responding to a GFR of approximately 50 and worsened from last year when the creatinine was 1.34.  His blood pressure is staying high, although when he checks it at home it is better than what we got today.  It is typically in  the high 130s-140s/70s-80s.  [name] had multivessel bypass surgery in 2006 (LIMA to LAD, free radial to circumflex) and in 2014 presented with a small non-STEMI and required percutaneous revascularization (proximal posterolateral artery 2.25 x 16 mm Promus and mid right coronary artery 2.5 x 38 mm Promus and balloon angioplasty of the ostium of the PDA). He is a "Plavix nonresponder", on ticagrelor  chronically.  Past Medical History:  Diagnosis Date   Atrial fibrillation (HCC)    CAD (coronary artery disease)    [facility]   CHF (congestive heart failure) (HCC) 11/08/2010   2D Echo EF=>55%   Diabetes mellitus    insulin    Diverticulosis    Dyspnea    Family hx of colon cancer    GERD (gastroesophageal reflux disease)    HTN (hypertension)    Hyperlipemia    Hyperplastic colon polyp 07/16/07   30cm    Myocardial infarction (HCC) 12/2012   OSA (obstructive sleep apnea) 12/21/2007   sleep study perform REM REM and NREM was 95.0%, AHI of 12.31hr and RDI of 12.4hr    Past Surgical History:  Procedure Laterality Date   CARDIAC CATHETERIZATION  2006 2011   2 stents   CATARACT EXTRACTION W/PHACO Left 12/03/2013   Procedure: CATARACT EXTRACTION PHACO AND INTRAOCULAR LENS PLACEMENT (IOC);  Surgeon: Anner Kill, MD;  Location: [facility];  Service: Ophthalmology;  Laterality: Left;  CDE:7.07   CATARACT EXTRACTION W/PHACO Right 12/14/2013   Procedure: CATARACT EXTRACTION PHACO  AND INTRAOCULAR LENS PLACEMENT (IOC);  Surgeon: Anner Kill, MD;  Location: [facility];  Service: Ophthalmology;  Laterality: Right;  CDE 7.84    COLONOSCOPY  07/2007   Dr Larrie Po: hyperplastic polyp 30cm   COLONOSCOPY N/A 08/12/2012   Procedure: COLONOSCOPY;  Surgeon: Alyce Jubilee, MD;  [facility];  Service: Endoscopy;  Laterality: N/A;  8:30   COLONOSCOPY N/A 11/11/2017   Dr. Nolene Baumgarten: multiple tubular adenomas, next surveillance in 2022.    COLONOSCOPY WITH PROPOFOL  N/A 01/10/2021   Procedure: COLONOSCOPY WITH  PROPOFOL ;  Surgeon: Vinetta Greening, DO;  [facility];  Service: Endoscopy;  Laterality: N/A;  8:45 / ASA II   CORONARY ARTERY BYPASS GRAFT  06/2004   x2 performed for 40% left main stenosis, 80% proximal LAD stenosis of the first diagonal artery, and 50% left circumfles artery priximal stenosis.LIMA to LAD and free radial artery to circumflex coronary artery   LEFT HEART CATHETERIZATION WITH CORONARY/GRAFT ANGIOGRAM  12/29/2012   Procedure: LEFT HEART CATHETERIZATION WITH Estella Helling;  Surgeon: Arleen Lacer, MD;  [facility] CATH LAB;  Service: Cardiovascular;;   POLYPECTOMY  11/11/2017   Procedure: POLYPECTOMY;  Surgeon: Alyce Jubilee, MD;  [facility];  Service: Endoscopy;;  ascending colon, transverse, splenic flexure, sigmoid   POLYPECTOMY  01/10/2021   Procedure: POLYPECTOMY;  Surgeon: Vinetta Greening, DO;  [facility];  Service: Endoscopy;;   TONSILLECTOMY  1962    Current Medications: Outpatient Medications Prior to Visit  Medication Sig Dispense Refill   acetaminophen  (TYLENOL ) 325 MG tablet Take 650 mg by mouth as needed for moderate pain or headache.     alfuzosin  (UROXATRAL ) 10 MG 24 hr tablet Take 1 tablet (10 mg total) by mouth at bedtime. 30 tablet 11   amLODipine  (NORVASC ) 10 MG tablet Take 1 tablet (10 mg total) by mouth daily. 90 tablet 3   aspirin  81 MG EC tablet Take 81 mg by mouth daily.     blood glucose meter kit and supplies KIT Dispense based on patient and insurance preference. Use up to four times daily as directed. (FOR ICD-10 E11.65) 1 each 5   blood glucose meter kit and supplies 1 each by Other route 4 (four) times daily. Dispense based on patient and insurance preference. Use up to four times daily as directed. (FOR ICD-10 E10.9, E11.9). 1 each 0   carvedilol  (COREG ) 12.5 MG tablet TAKE 1 TABLET(12.5 MG) BY MOUTH TWICE DAILY WITH A MEAL 180 tablet 2   Cholecalciferol (VITAMIN D3) 25 MCG (1000 UT) CAPS Take 2  capsules (2,000 Units total) by mouth daily. (Patient taking differently: Take 1,000 Units by mouth daily.) 60 capsule 3   Evolocumab  (REPATHA ) 140 MG/ML SOSY Inject 140 mg into the skin every 14 (fourteen) days. 6 mL 3   fenofibrate  (TRICOR ) 48 MG tablet TAKE 1 TABLET(48 MG) BY MOUTH DAILY 90 tablet 0   glucose blood (ONETOUCH VERIO) test strip USE TO CHECK BLOOD SUGAR FOUR TIMES DAILY AS DIRECTED 300 strip 2   hydrochlorothiazide  (HYDRODIURIL ) 25 MG tablet TAKE 1 TABLET(25 MG) BY MOUTH DAILY 90 tablet 0   insulin  glargine (LANTUS  SOLOSTAR) 100 UNIT/ML Solostar Pen Inject 70 Units into the skin at bedtime. 60 mL 3   insulin  lispro (HUMALOG  KWIKPEN) 100 UNIT/ML KwikPen Inject 14-20 Units into the skin 3 (three) times daily. 60 mL 3   Insulin  Pen Needle (PEN NEEDLES) 31G X 8 MM MISC Use to inject insulin  4  times daily 200 each 6   metFORMIN  (GLUCOPHAGE ) 1000 MG tablet Take 0.5 tablets (500 mg total) by mouth 2 (two) times daily with a meal.     Multiple Vitamin (MULTIVITAMIN) capsule Take 1 capsule by mouth daily.     nitroGLYCERIN  (NITROSTAT ) 0.4 MG SL tablet PLACE 1 TABLET UNDER TONGUE IF NEEDED FOR CHEST PAIN.Aundria Bloom OF 3 DOSES 25 tablet 6   pantoprazole (PROTONIX) 40 MG tablet Take 40 mg by mouth daily.     vitamin C (ASCORBIC ACID) 500 MG tablet Take 500 mg by mouth daily.     lisinopril  (ZESTRIL ) 20 MG tablet Take 1 tablet (20 mg total) by mouth 2 (two) times daily. 180 tablet 0   No facility-administered medications prior to visit.     Allergies:   Crestor [rosuvastatin]   Family History:  The patient's family history includes Arthritis in an other family member; Colon cancer in his father; Coronary artery disease (age of onset: 77) in his father; Diabetes in an other family member; Heart disease in an other family member; Lung cancer in his father.   ROS:   Please see the history of present illness.    All other systems are reviewed and are negative.   PHYSICAL EXAM:   VS:  BP (!)  174/72   Pulse 70   Ht 5\' 7"  (1.702 m)   Wt 89.4 kg   SpO2 98%   BMI 30.85 kg/m    Recheck BP 147/70   General: Alert, oriented x3, no distress, mildly obese Head: no evidence of trauma, PERRL, EOMI, no exophtalmos or lid lag, no myxedema, no xanthelasma; normal ears, nose and oropharynx Neck: normal jugular venous pulsations and no hepatojugular reflux; brisk carotid pulses without delay and no carotid bruits Chest: clear to auscultation, no signs of consolidation by percussion or palpation, normal fremitus, symmetrical and full respiratory excursions Cardiovascular: normal position and quality of the apical impulse, regular rhythm, normal first and second heart sounds, no murmurs, rubs or gallops Abdomen: no tenderness or distention, no masses by palpation, no abnormal pulsatility or arterial bruits, normal bowel sounds, no hepatosplenomegaly Extremities: no clubbing, cyanosis or edema; 2+ radial, ulnar and brachial pulses bilaterally; 2+ right femoral, posterior tibial and dorsalis pedis pulses; 2+ left femoral, posterior tibial and dorsalis pedis pulses; no subclavian or femoral bruits Neurological: grossly nonfocal Psych: Normal mood and affect    Wt Readings from Last 3 Encounters:  10/02/23 89.4 kg  09/24/23 90.4 kg  06/19/23 92.8 kg      Studies/Labs Reviewed:   EKG:    EKG Interpretation Date/Time:  Wednesday October 02 2023 11:32:20 EDT Ventricular Rate:  70 PR Interval:  198 QRS Duration:  88 QT Interval:  390 QTC Calculation: 421 R Axis:   61  Text Interpretation: Normal sinus rhythm Nonspecific ST abnormality When compared with ECG of 30-Dec-2012 06:01, No significant change was found Confirmed by Kevionna Heffler ([id number]) on 10/03/2023 1:40:20 PM         Recent Labs: 03/12/2023: TSH 3.220 09/17/2023: ALT 28; BUN 27; Creatinine, Ser 1.47; Potassium 4.8; Sodium 139   Lipid Panel    Component Value Date/Time   CHOL 202 (H) 03/12/2023 1006   TRIG 381 (H)  03/12/2023 1006   HDL 36 (L) 03/12/2023 1006   CHOLHDL 5.6 (H) 03/12/2023 1006   CHOLHDL 7.0 12/29/2012 0438   VLDL UNABLE TO CALCULATE IF TRIGLYCERIDE OVER 400 mg/dL 96/29/5284 1324   LDLCALC 102 (H) 03/12/2023 1006   11/27/2018 Chol 179,  HDL 27, TG 408 Creat 0.97 28/41/3244 A1c 7.3% 04/14/2020  hemoglobin A1c 7.2%  03/07/2021 Total cholesterol 228, HDL 41, triglycerides 252 Hemoglobin 12.0  04/12/2021 Creatinine 1.09, potassium 4.7, ALT 18  04/18/2021 hemoglobin A1c 6.9%  Additional studies/ records that were reviewed today include:    ASSESSMENT:    1. Chronic diastolic heart failure (HCC)   2. Coronary artery disease involving coronary bypass graft of native heart without angina pectoris   3. Essential hypertension   4. Type 2 diabetes mellitus with complication, with long-term current use of insulin  (HCC)   5. Mixed hyperlipidemia   6. Type 2 diabetes mellitus with vascular disease (HCC)        PLAN:  In order of problems listed above:  CHF: Appears clinically euvolemic and has NYHA functional class I.  Would rather not add medications for heart failure.  Cost has been an issue with Farxiga  and is likely to be an issue with Entresto as well (briefly took Farxiga  in 2018-2019).   CAD s/p CABG 2006, PCI-RCA 2014: Asymptomatic since his last event more than 10 years ago.  Known to be a clopidogrel nonresponder. HTN: Target systolic blood pressure under 010 since he has diabetes and chronic kidney disease.  He is already on maximum doses of ACE inhibitor and amlodipine  as well as the highest tolerated dose of carvedilol  (higher doses produced severe fatigue) and takes hydrochlorothiazide  and alfuzosin .  Will switch from lisinopril  to olmesartan  which will hopefully provide better blood pressure reduction and renal protection.  Option to consider Kerendia, but cost is again an issue.  Might start low-dose spironolactone. HLP: Intolerant to both rosuvastatin and  atorvastatin  due to myopathy.   On fenofibrate  for his triglycerides.  I do not think that his musculoskeletal side effects are related to the Repatha , but is not unreasonable to try a short holiday to see if his symptoms improved.  We did briefly discuss Leqvio as another option if he has similar complaints on rechallenge with Repatha . DM: Ideally would like hemoglobin A1c less than 7%, but he is close to the target. Obesity: Would greatly benefit from GLP-1 agonist such as Ozempic or Mounjaro, but cost is likely to be a barrier once again.   Medication Adjustments/Labs and Tests Ordered: Current medicines are reviewed at length with the patient today.  Concerns regarding medicines are outlined above.  Medication changes, Labs and Tests ordered today are listed in the Patient Instructions below. Patient Instructions  Medication Instructions:  Stop Lisinopril  Start Olmesartan  40 mg daily *If you need a refill on your cardiac medications before your next appointment, please call your pharmacy*  BP log daily- Please send in one month. Also, let us  know how you are doing off of the Repatha .  Follow-Up: At [facility], you and your health needs are our priority.  As part of our continuing mission to provide you with exceptional heart care, our providers are all part of one team.  This team includes your primary Cardiologist (physician) and Advanced Practice Providers or APPs (Physician Assistants and Nurse Practitioners) who all work together to provide you with the care you need, when you need it.  Your next appointment:   1 year(s)  Provider:   Luana Rumple, MD    We recommend signing up for the patient portal called "MyChart".  Sign up information is provided on this After Visit Summary.  MyChart is used to connect with patients for Virtual Visits (Telemedicine).  Patients are able to view lab/test  results, encounter notes, upcoming appointments, etc.  Non-urgent messages can be  sent to your provider as well.   To learn more about what you can do with MyChart, go to [email or web address].         Signed, Luana Rumple, MD  10/03/2023 1:43 PM    [facility] [address] [place], [place], [place]  [address] Phone: [phone]; Fax: [phone]

## 2023-10-02 NOTE — Progress Notes (Signed)
 Cardiology Office Note    Date:  10/02/2023   ID:  Francis Hall, Francis Hall [birth year], MRN [id number]  PCP:  Francis Amel, MD  Cardiologist:   Francis Rumple, MD   Chief complaint: CAD  History of Present Illness:  Francis Hall is a 68 y.o. male with coronary artery disease and previous bypass surgery (2006 LIMA-LAD, free radial to circumflex), subsequent presentation with non-STEMI and PCI to the native RCA in 2014 ), diastolic heart failure, insulin -requiring diabetes mellitus, hypertension, mixed hyperlipidemia with severe hypertriglyceridemia, statin myopathy.  He is known to be a "clopidogrel nonresponder".  He is feeling very well.  He denies any problems with chest pain or shortness of breath at rest or with activity.  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  He has not had dizziness, palpitations, syncope.  He denies orthopnea, PND or lower extremity edema.  Denies claudication or new focal neurological complaints.  Unfortunately he remains mildly obese with a BMI of about 31.  He requires insulin  for his diabetes which is fairly well-controlled with an A1c of 7.3% last November.  He has chronically elevated triglycerides and low HDL.  LDL which was 126 last December.  Recheck today and plan to start PCSK9 inhibitor.  His blood pressure is staying high, although when he checks it at home it is better than what we got today.  It is typically in the high 130s-140s/70s-80s.  Francis Hall had multivessel bypass surgery in 2006 (LIMA to LAD, free radial to circumflex) and in 2014 presented with a small non-STEMI and required percutaneous revascularization (proximal posterolateral artery 2.25 x 16 mm Promus and mid right coronary artery 2.5 x 38 mm Promus and balloon angioplasty of the ostium of the PDA). He is a "Plavix nonresponder", on ticagrelor  chronically.  Past Medical History:  Diagnosis Date   Atrial  fibrillation (HCC)    CAD (coronary artery disease)    [facility]   CHF (congestive heart failure) (HCC) 11/08/2010   2D Echo EF=>55%   Diabetes mellitus    insulin    Diverticulosis    Dyspnea    Family hx of colon cancer    GERD (gastroesophageal reflux disease)    HTN (hypertension)    Hyperlipemia    Hyperplastic colon polyp 07/16/07   30cm    Myocardial infarction (HCC) 12/2012   OSA (obstructive sleep apnea) 12/21/2007   sleep study perform REM REM and NREM was 95.0%, AHI of 12.31hr and RDI of 12.4hr    Past Surgical History:  Procedure Laterality Date   CARDIAC CATHETERIZATION  2006 2011   2 stents   CATARACT EXTRACTION W/PHACO Left 12/03/2013   Procedure: CATARACT EXTRACTION PHACO AND INTRAOCULAR LENS PLACEMENT (IOC);  Surgeon: Francis Kill, MD;  Location: [facility];  Service: Ophthalmology;  Laterality: Left;  CDE:7.07   CATARACT EXTRACTION W/PHACO Right 12/14/2013   Procedure: CATARACT EXTRACTION PHACO AND INTRAOCULAR LENS PLACEMENT (IOC);  Surgeon: Francis Kill, MD;  Location: [facility];  Service: Ophthalmology;  Laterality: Right;  CDE 7.84    COLONOSCOPY  07/2007   Dr Francis Hall: hyperplastic polyp 30cm   COLONOSCOPY N/A 08/12/2012   Procedure: COLONOSCOPY;  Surgeon: Francis Jubilee, MD;  Location: [facility];  Service: Endoscopy;  Laterality: N/A;  8:30   COLONOSCOPY N/A 11/11/2017   Dr. Nolene Hall: multiple tubular adenomas, next surveillance in 2022.    COLONOSCOPY WITH PROPOFOL  N/A  01/10/2021   Procedure: COLONOSCOPY WITH PROPOFOL ;  Surgeon: Francis Greening, DO;  Location: [facility];  Service: Endoscopy;  Laterality: N/A;  8:45 / ASA II   CORONARY ARTERY BYPASS GRAFT  06/2004   x2 performed for 40% left main stenosis, 80% proximal LAD stenosis of the first diagonal artery, and 50% left circumfles artery priximal stenosis.LIMA to LAD and free radial artery to circumflex coronary artery   LEFT HEART CATHETERIZATION WITH CORONARY/GRAFT ANGIOGRAM  12/29/2012   Procedure: LEFT  HEART CATHETERIZATION WITH Francis Hall;  Surgeon: Francis Lacer, MD;  Location: [facility] CATH LAB;  Service: Cardiovascular;;   POLYPECTOMY  11/11/2017   Procedure: POLYPECTOMY;  Surgeon: Francis Jubilee, MD;  Location: [facility];  Service: Endoscopy;;  ascending colon, transverse, splenic flexure, sigmoid   POLYPECTOMY  01/10/2021   Procedure: POLYPECTOMY;  Surgeon: Francis Greening, DO;  Location: [facility];  Service: Endoscopy;;   TONSILLECTOMY  1962    Current Medications: Outpatient Medications Prior to Visit  Medication Sig Dispense Refill   acetaminophen  (TYLENOL ) 325 MG tablet Take 650 mg by mouth as needed for moderate pain or headache.     alfuzosin  (UROXATRAL ) 10 MG 24 hr tablet Take 1 tablet (10 mg total) by mouth at bedtime. 30 tablet 11   amLODipine  (NORVASC ) 10 MG tablet Take 1 tablet (10 mg total) by mouth daily. 90 tablet 3   aspirin  81 MG EC tablet Take 81 mg by mouth daily.     blood glucose meter kit and supplies KIT Dispense based on patient and insurance preference. Use up to four times daily as directed. (FOR ICD-10 E11.65) 1 each 5   blood glucose meter kit and supplies 1 each by Other route 4 (four) times daily. Dispense based on patient and insurance preference. Use up to four times daily as directed. (FOR ICD-10 E10.9, E11.9). 1 each 0   carvedilol  (COREG ) 12.5 MG tablet TAKE 1 TABLET(12.5 MG) BY MOUTH TWICE DAILY WITH A MEAL 180 tablet 2   Cholecalciferol (VITAMIN D3) 25 MCG (1000 UT) CAPS Take 2 capsules (2,000 Units total) by mouth daily. (Patient taking differently: Take 1,000 Units by mouth daily.) 60 capsule 3   Evolocumab  (REPATHA ) 140 MG/ML SOSY Inject 140 mg into the skin every 14 (fourteen) days. 6 mL 3   fenofibrate  (TRICOR ) 48 MG tablet TAKE 1 TABLET(48 MG) BY MOUTH DAILY 90 tablet 0   glucose blood (ONETOUCH VERIO) test strip USE TO CHECK BLOOD SUGAR FOUR TIMES DAILY AS DIRECTED 300 strip 2   hydrochlorothiazide  (HYDRODIURIL ) 25 MG tablet  TAKE 1 TABLET(25 MG) BY MOUTH DAILY 90 tablet 0   insulin  glargine (LANTUS  SOLOSTAR) 100 UNIT/ML Solostar Pen Inject 70 Units into the skin at bedtime. 60 mL 3   insulin  lispro (HUMALOG  KWIKPEN) 100 UNIT/ML KwikPen Inject 14-20 Units into the skin 3 (three) times daily. 60 mL 3   Insulin  Pen Needle (PEN NEEDLES) 31G X 8 MM MISC Use to inject insulin  4 times daily 200 each 6   lisinopril  (ZESTRIL ) 20 MG tablet Take 1 tablet (20 mg total) by mouth 2 (two) times daily. 180 tablet 0   metFORMIN  (GLUCOPHAGE ) 1000 MG tablet Take 0.5 tablets (500 mg total) by mouth 2 (two) times daily with a meal.     Multiple Vitamin (MULTIVITAMIN) capsule Take 1 capsule by mouth daily.     nitroGLYCERIN  (NITROSTAT ) 0.4 MG SL tablet PLACE 1 TABLET UNDER TONGUE IF NEEDED FOR CHEST PAIN..MAX OF 3 DOSES 25  tablet 6   pantoprazole (PROTONIX) 40 MG tablet Take 40 mg by mouth daily.     vitamin C (ASCORBIC ACID) 500 MG tablet Take 500 mg by mouth daily.     No facility-administered medications prior to visit.     Allergies:   Crestor [rosuvastatin]   Social History   Socioeconomic History   Marital status: Married    Spouse name: Not on file   Number of children: 2   Years of education: 12   Highest education level: Not on file  Occupational History   Occupation: disabled Curator   Tobacco Use   Smoking status: Never   Smokeless tobacco: Never  Vaping Use   Vaping status: Never Used  Substance and Sexual Activity   Alcohol use: Yes    Comment: social-2 beers/wk   Drug use: No   Sexual activity: Yes    Birth control/protection: None  Other Topics Concern   Not on file  Social History Narrative   Not on file   Social Drivers of Health   Financial Resource Strain: Low Risk  (02/14/2022)   Overall Financial Resource Strain (CARDIA)    Difficulty of Paying Living Expenses: Not hard at all  Food Insecurity: No Food Insecurity (05/19/2019)   Hunger Vital Sign    Worried About Running Out of Food in the  Last Year: Never true    Ran Out of Food in the Last Year: Never true  Transportation Needs: No Transportation Needs (05/19/2019)   PRAPARE - Administrator, Civil Service (Medical): No    Lack of Transportation (Non-Medical): No  Physical Activity: Not on file  Stress: Not on file  Social Connections: Not on file     Family History:  The patient's family history includes Arthritis in an other family member; Colon cancer in his father; Coronary artery disease (age of onset: 78) in his father; Diabetes in an other family member; Heart disease in an other family member; Lung cancer in his father.   ROS:   Please see the history of present illness.    All other systems are reviewed and are negative.   PHYSICAL EXAM:   VS:  BP (!) 174/72   Pulse 70   Ht 5\' 7"  (1.702 m)   Wt 89.4 kg   SpO2 98%   BMI 30.85 kg/m    BP 168/79, recheck 142/70 Heart rate 81  General: Alert, oriented x3, no distress, mildly obese Head: no evidence of trauma, PERRL, EOMI, no exophtalmos or lid lag, no myxedema, no xanthelasma; normal ears, nose and oropharynx Neck: normal jugular venous pulsations and no hepatojugular reflux; brisk carotid pulses without delay and no carotid bruits Chest: clear to auscultation, no signs of consolidation by percussion or palpation, normal fremitus, symmetrical and full respiratory excursions Cardiovascular: normal position and quality of the apical impulse, regular rhythm, normal first and second heart sounds, no murmurs, rubs or gallops Abdomen: no tenderness or distention, no masses by palpation, no abnormal pulsatility or arterial bruits, normal bowel sounds, no hepatosplenomegaly Extremities: no clubbing, cyanosis or edema; 2+ radial, ulnar and brachial pulses bilaterally; 2+ right femoral, posterior tibial and dorsalis pedis pulses; 2+ left femoral, posterior tibial and dorsalis pedis pulses; no subclavian or femoral bruits Neurological: grossly nonfocal Psych:  Normal mood and affect    Wt Readings from Last 3 Encounters:  10/02/23 89.4 kg  09/24/23 90.4 kg  06/19/23 92.8 kg      Studies/Labs Reviewed:   EKG:  EKG  is ordered today.  Personally reviewed, shows sinus rhythm with a single PVC and is otherwise normal.  QTc 427 ms.  Recent Labs: 03/12/2023: TSH 3.220 09/17/2023: ALT 28; BUN 27; Creatinine, Ser 1.47; Potassium 4.8; Sodium 139   Lipid Panel    Component Value Date/Time   CHOL 202 (H) 03/12/2023 1006   TRIG 381 (H) 03/12/2023 1006   HDL 36 (L) 03/12/2023 1006   CHOLHDL 5.6 (H) 03/12/2023 1006   CHOLHDL 7.0 12/29/2012 0438   VLDL UNABLE TO CALCULATE IF TRIGLYCERIDE OVER 400 mg/dL 03/47/4259 5638   LDLCALC 102 (H) 03/12/2023 1006   11/27/2018 Chol 179, HDL 27, TG 408 Creat 0.97 75/64/3329 A1c 7.3% 04/14/2020  hemoglobin A1c 7.2%  03/07/2021 Total cholesterol 228, HDL 41, triglycerides 252 Hemoglobin 12.0  04/12/2021 Creatinine 1.09, potassium 4.7, ALT 18  04/18/2021 hemoglobin A1c 6.9%  Additional studies/ records that were reviewed today include:    ASSESSMENT:    1. Essential hypertension   2. Coronary artery disease involving coronary bypass graft of native heart without angina pectoris   3. Chronic diastolic heart failure (HCC)   4. Type 2 diabetes mellitus with complication, with long-term current use of insulin  (HCC)        PLAN:  In order of problems listed above:  CHF: Clinically euvolemic NYHA functional class I.  Not on SGLT2 inhibitor: ? due to cost issues (briefly took Farxiga  in 2018-2019).   CAD s/p CABG 2006, PCI-RCA 2014: Currently asymptomatic.  Known to be a clopidogrel nonresponder. HTN: Target systolic blood pressure less than 130.  Previously poorly tolerant of higher doses of carvedilol  to fatigue.  Increase amlodipine  to 10 mg daily. HLP: Intolerant to both rosuvastatin and atorvastatin  due to myopathy.  Starting on Repatha , need to recheck his lipid profile.  On fenofibrate  for  his triglycerides. DM: A1c is not quite in target range, less than 7%.   Obesity: He is lost some ground in his effort to lose weight and has gained back 7 pounds.  GLP-1 agonist would be a great addition to his medications for diabetes.   Medication Adjustments/Labs and Tests Ordered: Current medicines are reviewed at length with the patient today.  Concerns regarding medicines are outlined above.  Medication changes, Labs and Tests ordered today are listed in the Patient Instructions below. There are no Patient Instructions on file for this visit.   Signed, Francis Rumple, MD  10/02/2023 12:38 PM    [facility] [address] [place], [place], [place]  [address] Phone: [phone]; Fax: [phone]

## 2023-10-02 NOTE — Patient Instructions (Signed)
 Medication Instructions:  Stop Lisinopril  Start Olmesartan 40 mg daily *If you need a refill on your cardiac medications before your next appointment, please call your pharmacy*  BP log daily- Please send in one month. Also, let us  know how you are doing off of the Repatha .  Follow-Up: At [facility], you and your health needs are our priority.  As part of our continuing mission to provide you with exceptional heart care, our providers are all part of one team.  This team includes your primary Cardiologist (physician) and Advanced Practice Providers or APPs (Physician Assistants and Nurse Practitioners) who all work together to provide you with the care you need, when you need it.  Your next appointment:   1 year(s)  Provider:   Luana Rumple, MD    We recommend signing up for the patient portal called "MyChart".  Sign up information is provided on this After Visit Summary.  MyChart is used to connect with patients for Virtual Visits (Telemedicine).  Patients are able to view lab/test results, encounter notes, upcoming appointments, etc.  Non-urgent messages can be sent to your provider as well.   To learn more about what you can do with MyChart, go to [email or web address].

## 2023-10-03 ENCOUNTER — Telehealth: Payer: Self-pay | Admitting: Cardiovascular Disease

## 2023-10-03 ENCOUNTER — Encounter: Payer: Self-pay | Admitting: Urology

## 2023-10-03 MED ORDER — FENOFIBRATE 48 MG PO TABS: ORAL_TABLET | ORAL | 3 refills | Status: AC

## 2023-10-03 NOTE — Patient Instructions (Signed)

## 2023-10-03 NOTE — Telephone Encounter (Signed)
 Pt's medication was sent to pt's pharmacy as requested. Confirmation received.

## 2023-10-03 NOTE — Telephone Encounter (Signed)
*  STAT* If patient is at the pharmacy, call can be transferred to refill team.   1. Which medications need to be refilled? (please list name of each medication and dose if known)   fenofibrate  (TRICOR ) 48 MG tablet   2. Would you like to learn more about the convenience, safety, & potential cost savings by using the [facility]?   3. Are you open to using the [facility] (Type [facility]. ).  4. Which pharmacy/location (including street and city if local pharmacy) is medication to be sent to?  [id number] - [place], [place] - [address]   5. Do they need a 30 day or 90 day supply? 90 day  Wife Amalia Badder) stated patient only has 3 days left of this medication.

## 2023-10-07 DIAGNOSIS — E1165 Type 2 diabetes mellitus with hyperglycemia: Secondary | ICD-10-CM | POA: Diagnosis not present

## 2023-10-25 ENCOUNTER — Other Ambulatory Visit: Payer: Self-pay

## 2023-10-25 MED ORDER — CARVEDILOL 12.5 MG PO TABS: ORAL_TABLET | ORAL | 3 refills | Status: AC

## 2023-11-07 DIAGNOSIS — E1165 Type 2 diabetes mellitus with hyperglycemia: Secondary | ICD-10-CM | POA: Diagnosis not present

## 2023-12-02 ENCOUNTER — Other Ambulatory Visit: Payer: Self-pay | Admitting: Cardiovascular Disease

## 2023-12-07 DIAGNOSIS — E1165 Type 2 diabetes mellitus with hyperglycemia: Secondary | ICD-10-CM | POA: Diagnosis not present

## 2023-12-18 ENCOUNTER — Other Ambulatory Visit: Payer: Self-pay | Admitting: Nurse Practitioner

## 2023-12-18 DIAGNOSIS — E1159 Type 2 diabetes mellitus with other circulatory complications: Secondary | ICD-10-CM

## 2023-12-24 DIAGNOSIS — S63592A Other specified sprain of left wrist, initial encounter: Secondary | ICD-10-CM | POA: Diagnosis not present

## 2023-12-30 ENCOUNTER — Telehealth: Payer: Self-pay | Admitting: Cardiovascular Disease

## 2023-12-31 NOTE — Telephone Encounter (Signed)
*  STAT* If patient is at the pharmacy, call can be transferred to refill team.   1. Which medications need to be refilled? (please list name of each medication and dose if known) amLODipine  (NORVASC ) 10 MG tablet    2. Would you like to learn more about the convenience, safety, & potential cost savings by using the [facility]?      3. Are you open to using the [facility] (Type [facility].  ).   4. Which pharmacy/location (including street and city if local pharmacy) is medication to be sent to? [id number] - [place], [place] - [address]    5. Do they need a 30 day or 90 day supply? 90 day Patient has only 3 pills left.

## 2024-01-07 DIAGNOSIS — E1165 Type 2 diabetes mellitus with hyperglycemia: Secondary | ICD-10-CM | POA: Diagnosis not present

## 2024-01-07 DIAGNOSIS — M19032 Primary osteoarthritis, left wrist: Secondary | ICD-10-CM | POA: Diagnosis not present

## 2024-01-20 ENCOUNTER — Encounter: Payer: Self-pay | Admitting: Nurse Practitioner

## 2024-01-20 ENCOUNTER — Ambulatory Visit (INDEPENDENT_AMBULATORY_CARE_PROVIDER_SITE_OTHER): Admitting: Nurse Practitioner

## 2024-01-20 VITALS — BP 122/62 | HR 61 | Ht 67.0 in | Wt 196.4 lb

## 2024-01-20 DIAGNOSIS — Z794 Long term (current) use of insulin: Secondary | ICD-10-CM | POA: Diagnosis not present

## 2024-01-20 DIAGNOSIS — I10 Essential (primary) hypertension: Secondary | ICD-10-CM | POA: Diagnosis not present

## 2024-01-20 DIAGNOSIS — Z7984 Long term (current) use of oral hypoglycemic drugs: Secondary | ICD-10-CM

## 2024-01-20 DIAGNOSIS — E1159 Type 2 diabetes mellitus with other circulatory complications: Secondary | ICD-10-CM | POA: Diagnosis not present

## 2024-01-20 DIAGNOSIS — E782 Mixed hyperlipidemia: Secondary | ICD-10-CM

## 2024-01-20 LAB — POCT GLYCOSYLATED HEMOGLOBIN (HGB A1C): Hemoglobin A1C: 7.6 % — AB (ref 4.0–5.6)

## 2024-01-20 MED ORDER — LANTUS SOLOSTAR 100 UNIT/ML ~~LOC~~ SOPN: 70.0000 [IU] | PEN_INJECTOR | Freq: Every day | SUBCUTANEOUS | 1 refills | Status: DC

## 2024-01-20 MED ORDER — PEN NEEDLES 31G X 8 MM MISC: 6 refills | Status: AC

## 2024-01-20 MED ORDER — INSULIN LISPRO (1 UNIT DIAL) 100 UNIT/ML (KWIKPEN): 14.0000 [IU] | PEN_INJECTOR | Freq: Three times a day (TID) | SUBCUTANEOUS | 3 refills | Status: DC

## 2024-01-20 MED ORDER — METFORMIN HCL 1000 MG PO TABS: 500.0000 mg | ORAL_TABLET | Freq: Two times a day (BID) | ORAL | Status: DC

## 2024-01-20 NOTE — Progress Notes (Signed)
 01/20/2024   Endocrinology follow-up note   Subjective:    Patient ID: Francis Hall, male    DOB: 17-Oct-1955,    Past Medical History:  Diagnosis Date   Atrial fibrillation (HCC)    CAD (coronary artery disease)    [facility]   CHF (congestive heart failure) (HCC) 11/08/2010   2D Echo EF=>55%   Diabetes mellitus    insulin    Diverticulosis    Dyspnea    Family hx of colon cancer    GERD (gastroesophageal reflux disease)    HTN (hypertension)    Hyperlipemia    Hyperplastic colon polyp 07/16/07   30cm    Myocardial infarction (HCC) 12/2012   OSA (obstructive sleep apnea) 12/21/2007   sleep study perform REM REM and NREM was 95.0%, AHI of 12.31hr and RDI of 12.4hr   Past Surgical History:  Procedure Laterality Date   CARDIAC CATHETERIZATION  2006 2011   2 stents   CATARACT EXTRACTION W/PHACO Left 12/03/2013   Procedure: CATARACT EXTRACTION PHACO AND INTRAOCULAR LENS PLACEMENT (IOC);  Surgeon: Cherene Mania, MD;  Location: [facility];  Service: Ophthalmology;  Laterality: Left;  CDE:7.07   CATARACT EXTRACTION W/PHACO Right 12/14/2013   Procedure: CATARACT EXTRACTION PHACO AND INTRAOCULAR LENS PLACEMENT (IOC);  Surgeon: Cherene Mania, MD;  Location: [facility];  Service: Ophthalmology;  Laterality: Right;  CDE 7.84    COLONOSCOPY  07/2007   Dr Mavis: hyperplastic polyp 30cm   COLONOSCOPY N/A 08/12/2012   Procedure: COLONOSCOPY;  Surgeon: Margo LITTIE Haddock, MD;  Location: [facility];  Service: Endoscopy;  Laterality: N/A;  8:30   COLONOSCOPY N/A 11/11/2017   Dr. Haddock: multiple tubular adenomas, next surveillance in 2022.    COLONOSCOPY WITH PROPOFOL  N/A 01/10/2021   Procedure: COLONOSCOPY WITH PROPOFOL ;  Surgeon: Cindie Carlin POUR, DO;  Location: [facility];  Service: Endoscopy;  Laterality: N/A;  8:45 / ASA II   CORONARY ARTERY BYPASS GRAFT  06/2004   x2 performed for 40% left main stenosis, 80% proximal LAD stenosis of the first diagonal artery, and 50% left circumfles  artery priximal stenosis.LIMA to LAD and free radial artery to circumflex coronary artery   LEFT HEART CATHETERIZATION WITH CORONARY/GRAFT ANGIOGRAM  12/29/2012   Procedure: LEFT HEART CATHETERIZATION WITH EL BILE;  Surgeon: Francis LELON Clay, MD;  Location: [facility];  Service: Cardiovascular;;   POLYPECTOMY  11/11/2017   Procedure: POLYPECTOMY;  Surgeon: Haddock Margo LITTIE, MD;  Location: [facility];  Service: Endoscopy;;  ascending colon, transverse, splenic flexure, sigmoid   POLYPECTOMY  01/10/2021   Procedure: POLYPECTOMY;  Surgeon: Cindie Carlin POUR, DO;  Location: [facility];  Service: Endoscopy;;   TONSILLECTOMY  1962   Social History   Socioeconomic History   Marital status: Married    Spouse name: Not on file   Number of children: 2   Years of education: 12   Highest education level: Not on file  Occupational History   Occupation: disabled Curator   Tobacco Use   Smoking status: Never   Smokeless tobacco: Never  Vaping Use   Vaping status: Never Used  Substance and Sexual Activity   Alcohol use: Yes    Comment: social-2 beers/wk   Drug use: No   Sexual activity: Yes    Birth control/protection: None  Other Topics Concern   Not on file  Social History Narrative   Not on file   Social Drivers of Health  Financial Resource Strain: Low Risk  (02/14/2022)   Overall Financial Resource Strain (CARDIA)    Difficulty of Paying Living Expenses: Not hard at all  Food Insecurity: No Food Insecurity (05/19/2019)   Hunger Vital Sign    Worried About Running Out of Food in the Last Year: Never true    Ran Out of Food in the Last Year: Never true  Transportation Needs: No Transportation Needs (05/19/2019)   PRAPARE - Administrator, Civil Service (Medical): No    Lack of Transportation (Non-Medical): No  Physical Activity: Not on file  Stress: Not on file  Social Connections: Not on file   Outpatient Encounter Medications as of 01/20/2024   Medication Sig   acetaminophen  (TYLENOL ) 325 MG tablet Take 650 mg by mouth as needed for moderate pain or headache.   alfuzosin  (UROXATRAL ) 10 MG 24 hr tablet Take 1 tablet (10 mg total) by mouth at bedtime.   amLODipine  (NORVASC ) 10 MG tablet TAKE 1 TABLET(10 MG) BY MOUTH DAILY   aspirin  81 MG EC tablet Take 81 mg by mouth daily.   blood glucose meter kit and supplies KIT Dispense based on patient and insurance preference. Use up to four times daily as directed. (FOR ICD-10 E11.65)   blood glucose meter kit and supplies 1 each by Other route 4 (four) times daily. Dispense based on patient and insurance preference. Use up to four times daily as directed. (FOR ICD-10 E10.9, E11.9).   carvedilol  (COREG ) 12.5 MG tablet TAKE 1 TABLET(12.5 MG) BY MOUTH TWICE DAILY WITH A MEAL   Evolocumab  (REPATHA ) 140 MG/ML SOSY Inject 140 mg into the skin every 14 (fourteen) days.   fenofibrate  (TRICOR ) 48 MG tablet TAKE 1 TABLET(48 MG) BY MOUTH DAILY   glucose blood (ONETOUCH VERIO) test strip USE TO CHECK BLOOD SUGAR FOUR TIMES DAILY AS DIRECTED   hydrochlorothiazide  (HYDRODIURIL ) 25 MG tablet TAKE 1 TABLET(25 MG) BY MOUTH DAILY   Multiple Vitamin (MULTIVITAMIN) capsule Take 1 capsule by mouth daily.   nitroGLYCERIN  (NITROSTAT ) 0.4 MG SL tablet PLACE 1 TABLET UNDER TONGUE IF NEEDED FOR CHEST PAIN.AZALEA OF 3 DOSES   olmesartan  (BENICAR ) 40 MG tablet Take 1 tablet (40 mg total) by mouth daily.   pantoprazole (PROTONIX) 40 MG tablet Take 40 mg by mouth daily.   vitamin C (ASCORBIC ACID) 500 MG tablet Take 500 mg by mouth daily.   [DISCONTINUED] insulin  glargine (LANTUS  SOLOSTAR) 100 UNIT/ML Solostar Pen Inject 70 Units into the skin at bedtime.   [DISCONTINUED] insulin  lispro (HUMALOG  KWIKPEN) 100 UNIT/ML KwikPen Inject 14-20 Units into the skin 3 (three) times daily.   [DISCONTINUED] Insulin  Pen Needle (PEN NEEDLES) 31G X 8 MM MISC Use to inject insulin  4 times daily   [DISCONTINUED] metFORMIN  (GLUCOPHAGE ) 1000 MG  tablet Take 0.5 tablets (500 mg total) by mouth 2 (two) times daily with a meal.   Cholecalciferol (VITAMIN D3) 25 MCG (1000 UT) CAPS Take 2 capsules (2,000 Units total) by mouth daily. (Patient not taking: Reported on 01/20/2024)   insulin  glargine (LANTUS  SOLOSTAR) 100 UNIT/ML Solostar Pen Inject 70 Units into the skin at bedtime.   insulin  lispro (HUMALOG  KWIKPEN) 100 UNIT/ML KwikPen Inject 14-20 Units into the skin 3 (three) times daily.   Insulin  Pen Needle (PEN NEEDLES) 31G X 8 MM MISC Use to inject insulin  4 times daily   metFORMIN  (GLUCOPHAGE ) 1000 MG tablet Take 0.5 tablets (500 mg total) by mouth 2 (two) times daily with a meal.   No facility-administered encounter  medications on file as of 01/20/2024.   ALLERGIES: Allergies  Allergen Reactions   Crestor [Rosuvastatin] Other (See Comments)    myalgia   VACCINATION STATUS:  There is no immunization history on file for this patient.  Diabetes He presents for his follow-up diabetic visit. He has type 2 diabetes mellitus. Onset time: He was diagnosed at approximate age of 74 years. His disease course has been fluctuating. There are no hypoglycemic associated symptoms. Pertinent negatives for hypoglycemia include no confusion, pallor or seizures. There are no diabetic associated symptoms. Pertinent negatives for diabetes include no fatigue, no polydipsia, no polyphagia, no polyuria and no weakness. There are no hypoglycemic complications. Symptoms are stable. Diabetic complications include heart disease, nephropathy and peripheral neuropathy. Risk factors for coronary artery disease include dyslipidemia, diabetes mellitus, hypertension, male sex, sedentary lifestyle and obesity. Current diabetic treatment includes intensive insulin  program and oral agent (monotherapy). He is compliant with treatment most of the time. His weight is fluctuating minimally. He is following a generally healthy diet. When asked about meal planning, he reported none.  He has had a previous visit with a dietitian. He participates in exercise intermittently. His home blood glucose trend is fluctuating minimally. His overall blood glucose range is >200 mg/dl. (He presents today with his CGM and logs showing slightly above target glycemic profile overall.  His POCT A1c today is 7% improving from last visit of 7.2%.  He notes his diet is about the same but he sometimes does eat a little later in the evening than usual.  He did have a steroid injection in his wrist without much improvement- being evaluated for surgery soon.  Analysis of his CGM shows TIR 38%, TAR 61%, TBR 1% with a GMI of 8.4%.) An ACE inhibitor/angiotensin II receptor blocker is being taken. He does not see a podiatrist.Eye exam is current.    Review of systems  Constitutional: + Minimally fluctuating body weight,  current Body mass index is 30.76 kg/m. , no fatigue, no subjective hyperthermia, no subjective hypothermia Eyes: no blurry vision, no xerophthalmia ENT: no sore throat, no nodules palpated in throat, no dysphagia/odynophagia, no hoarseness Cardiovascular: no chest pain, no shortness of breath, no palpitations, no leg swelling Respiratory: no cough, no shortness of breath Gastrointestinal: no nausea/vomiting/diarrhea Musculoskeletal: left wrist pain- in brace Skin: no rashes, no hyperemia Neurological: no tremors, no numbness, no tingling, no dizziness Psychiatric: no depression, no anxiety   Objective:    BP 122/62 (BP Location: Right Arm, Patient Position: Sitting, Cuff Size: Large)   Pulse 61   Ht 5' 7 (1.702 m)   Wt 196 lb 6.4 oz (89.1 kg)   BMI 30.76 kg/m   Wt Readings from Last 3 Encounters:  01/20/24 196 lb 6.4 oz (89.1 kg)  10/02/23 197 lb (89.4 kg)  09/24/23 199 lb 6.4 oz (90.4 kg)    BP Readings from Last 3 Encounters:  01/20/24 122/62  10/02/23 (!) 174/72  09/30/23 (!) 157/75      Physical Exam- Limited  Constitutional:  Body mass index is 30.76 kg/m. ,  not in acute distress, normal state of mind Eyes:  EOMI, no exophthalmos Musculoskeletal: no gross deformities, strength intact in all four extremities, no gross restriction of joint movements Skin:  no rashes, no hyperemia Neurological: no tremor with outstretched hands   Diabetic Foot Exam - Simple   No data filed     Results for orders placed or performed in visit on 01/20/24  HgB A1c   Collection Time:  01/20/24  3:04 PM  Result Value Ref Range   Hemoglobin A1C 7.6 (A) 4.0 - 5.6 %   HbA1c POC (<> result, manual entry)     HbA1c, POC (prediabetic range)     HbA1c, POC (controlled diabetic range)     Diabetic Labs (most recent): Lab Results  Component Value Date   HGBA1C 7.6 (A) 01/20/2024   HGBA1C 7.2 (A) 09/24/2023   HGBA1C 7.6 (A) 06/19/2023   MICROALBUR 150 mg/L 03/18/2023   MICROALBUR 150 04/18/2021   Lipid Panel     Component Value Date/Time   CHOL 202 (H) 03/12/2023 1006   TRIG 381 (H) 03/12/2023 1006   HDL 36 (L) 03/12/2023 1006   CHOLHDL 5.6 (H) 03/12/2023 1006   CHOLHDL 7.0 12/29/2012 0438   VLDL UNABLE TO CALCULATE IF TRIGLYCERIDE OVER 400 mg/dL 92/71/7985 9561   LDLCALC 102 (H) 03/12/2023 1006       Latest Ref Rng & Units 09/17/2023   12:56 PM 03/12/2023   10:06 AM 12/13/2021    9:43 AM  CMP  Glucose 70 - 99 mg/dL 64  791  807   BUN 8 - 27 mg/dL 27  20  26    Creatinine 0.76 - 1.27 mg/dL 8.52  8.65  8.73   Sodium 134 - 144 mmol/L 139  139  138   Potassium 3.5 - 5.2 mmol/L 4.8  5.1  4.6   Chloride 96 - 106 mmol/L 101  102  101   CO2 20 - 29 mmol/L 24  22  18    Calcium  8.6 - 10.2 mg/dL 9.6  9.8  9.9   Total Protein 6.0 - 8.5 g/dL 6.9  6.7  6.4   Total Bilirubin 0.0 - 1.2 mg/dL 0.4  0.2  0.2   Alkaline Phos 44 - 121 IU/L 76  75  79   AST 0 - 40 IU/L 23  17  19    ALT 0 - 44 IU/L 28  21  19      Assessment & Plan:   1) Type 2 diabetes mellitus with vascular disease (HCC)  His diabetes is complicated by coronary artery disease status post CABG in  2006 and stent placement in 2014.  He presents today with his CGM and logs showing slightly above target glycemic profile overall.  His POCT A1c today is 7% improving from last visit of 7.2%.  He notes his diet is about the same but he sometimes does eat a little later in the evening than usual.  He did have a steroid injection in his wrist without much improvement- being evaluated for surgery soon.  Analysis of his CGM shows TIR 38%, TAR 61%, TBR 1% with a GMI of 8.4%.  -Recent labs reviewed.    - Francis Hall remains at a high risk for more acute and chronic complications of diabetes which include CAD, CVA, CKD, retinopathy, and neuropathy. These are all discussed in detail with the patient.  - Nutritional counseling repeated at each appointment due to patients tendency to fall back in to old habits.  - The patient admits there is a room for improvement in their diet and drink choices. -  Suggestion is made for the patient to avoid simple carbohydrates from their diet including Cakes, Sweet Desserts / Pastries, Ice Cream, Soda (diet and regular), Sweet Tea, Candies, Chips, Cookies, Sweet Pastries, Store Bought Juices, Alcohol in Excess of 1-2 drinks a day, Artificial Sweeteners, Coffee Creamer, and Sugar-free Products. This will help patient to have stable blood glucose  profile and potentially avoid unintended weight gain.   - I encouraged the patient to switch to unprocessed or minimally processed complex starch and increased protein intake (animal or plant source), fruits, and vegetables.   - Patient is advised to stick to a routine mealtimes to eat 3 meals a day and avoid unnecessary snacks (to snack only to correct hypoglycemia).  - I have approached patient with the following individualized plan to manage diabetes and patient agrees.  -Given his stable, slowly improving glycemic profile, no changes will be made to his regimen today.  His recent elevation in sugar readings is correlated with  his steroid injection.  He is advised to continue Lantus  70 units SQ nightly and continue Novolog  14-20 units TID with meals if glucose is above 90 and he is eating (Specific instructions on how to titrate insulin  dosage based on glucose readings given to patient in writing)  He can continue Metformin  500 mg po twice daily with meals (can cut his current 1000 mg tabs in half).  -He is encouraged to continue to monitor glucose 4 times daily, before meals and before bed, and call the clinic if he has readings less than 70 or greater than 300 for 3 tests in a row.  He is advised to continue using his CGM device.  2) Lipids/HPL:  His most recent lipid panel from 03/12/23 shows uncontrolled LDL at 102 and elevated triglycerides at 381 (worsening).  He notes he does not tolerate statins and he is having similar side effects with his Repatha  so he stopped taking it.  He is advised to reach out to his cardiologist to let them know of the side effects and see if there are other options.  He is advised to avoid fried foods and butter.  He has appt coming up with PCP for annual physical.    3) Hypertension:  His blood pressure is controlled to target.  He is advised to continue meds as prescribed by his PCP.    4) Weight management- His Body mass index is 30.76 kg/m.  He will benefit from moderate weight loss.  Exercise, carbs management discussed with him.  I advised patient to maintain close follow up with Dr. Norleen General for primary care needs.  He is  also advised to maintain close follow-up with his cardiologist.     I spent  49  minutes in the care of the patient today including review of labs from CMP, Lipids, Thyroid  Function, Hematology (current and previous including abstractions from other facilities); face-to-face time discussing  his blood glucose readings/logs, discussing hypoglycemia and hyperglycemia episodes and symptoms, medications doses, his options of short and long term treatment based  on the latest standards of care / guidelines;  discussion about incorporating lifestyle medicine;  and documenting the encounter. Risk reduction counseling performed per USPSTF guidelines to reduce obesity and cardiovascular risk factors.     Please refer to Patient Instructions for Blood Glucose Monitoring and Insulin /Medications Dosing Guide  in media tab for additional information. Please  also refer to  Patient Self Inventory in the Media  tab for reviewed elements of pertinent patient history.  Francis Hall in the discussions, expressed understanding, and voiced agreement with the above plans.  All questions were answered to his satisfaction. he is encouraged to contact clinic should he have any questions or concerns prior to his return visit.   Follow up plan: Return in about 4 months (around 05/21/2024) for Diabetes F/U with A1c in office,  No previsit labs, Bring meter and logs.  Benton Rio, [facility] [address] [place], [place] [address] Phone: [phone] Fax: [phone]  01/20/2024, 3:27 PM

## 2024-01-27 ENCOUNTER — Ambulatory Visit: Admitting: Nurse Practitioner

## 2024-01-31 ENCOUNTER — Telehealth: Payer: Self-pay | Admitting: Cardiovascular Disease

## 2024-01-31 NOTE — Telephone Encounter (Signed)
 Left message on [facility] voice mail that pt was taking repatha . Told him he may call back with any questions.

## 2024-01-31 NOTE — Telephone Encounter (Signed)
 [employer] is calling on behalf of pt; they would like to know why pt is no longer taking rosuvastatin and if there is a specific billing diagnosis code for why pt is no longer taking medication. They will also be faxing over a paper explaining why they need this information. Please advise.

## 2024-02-04 NOTE — Telephone Encounter (Signed)
  Francis Hall is calling back. He also wanted to know why the pt was taken off statin drugs. Also he will be faxing us  what specifics he need to know about the patient

## 2024-02-06 ENCOUNTER — Other Ambulatory Visit: Payer: Self-pay | Admitting: Nurse Practitioner

## 2024-02-06 DIAGNOSIS — Z789 Other specified health status: Secondary | ICD-10-CM

## 2024-02-06 DIAGNOSIS — M19032 Primary osteoarthritis, left wrist: Secondary | ICD-10-CM | POA: Diagnosis not present

## 2024-02-06 DIAGNOSIS — M791 Myalgia, unspecified site: Secondary | ICD-10-CM

## 2024-02-07 DIAGNOSIS — E1165 Type 2 diabetes mellitus with hyperglycemia: Secondary | ICD-10-CM | POA: Diagnosis not present

## 2024-03-02 ENCOUNTER — Other Ambulatory Visit: Payer: Self-pay

## 2024-03-03 MED ORDER — HYDROCHLOROTHIAZIDE 25 MG PO TABS: ORAL_TABLET | ORAL | 1 refills | Status: AC

## 2024-03-08 DIAGNOSIS — E1165 Type 2 diabetes mellitus with hyperglycemia: Secondary | ICD-10-CM | POA: Diagnosis not present

## 2024-03-11 DIAGNOSIS — H9193 Unspecified hearing loss, bilateral: Secondary | ICD-10-CM | POA: Diagnosis not present

## 2024-03-11 DIAGNOSIS — H6123 Impacted cerumen, bilateral: Secondary | ICD-10-CM | POA: Diagnosis not present

## 2024-03-27 ENCOUNTER — Other Ambulatory Visit

## 2024-03-27 DIAGNOSIS — R972 Elevated prostate specific antigen [PSA]: Secondary | ICD-10-CM

## 2024-03-28 LAB — PSA, TOTAL AND FREE
PSA, Free Pct: 37.4 %
PSA, Free: 3.22 ng/mL
Prostate Specific Ag, Serum: 8.6 ng/mL — ABNORMAL HIGH (ref 0.0–4.0)

## 2024-03-31 ENCOUNTER — Ambulatory Visit: Payer: Self-pay

## 2024-04-03 ENCOUNTER — Telehealth (HOSPITAL_BASED_OUTPATIENT_CLINIC_OR_DEPARTMENT_OTHER): Payer: Self-pay | Admitting: *Deleted

## 2024-04-03 ENCOUNTER — Ambulatory Visit (INDEPENDENT_AMBULATORY_CARE_PROVIDER_SITE_OTHER): Admitting: Urology

## 2024-04-03 VITALS — BP 128/65 | HR 65

## 2024-04-03 DIAGNOSIS — R972 Elevated prostate specific antigen [PSA]: Secondary | ICD-10-CM | POA: Diagnosis not present

## 2024-04-03 DIAGNOSIS — R339 Retention of urine, unspecified: Secondary | ICD-10-CM

## 2024-04-03 LAB — URINALYSIS, ROUTINE W REFLEX MICROSCOPIC
Bilirubin, UA: NEGATIVE
Glucose, UA: NEGATIVE
Ketones, UA: NEGATIVE
Leukocytes,UA: NEGATIVE
Nitrite, UA: NEGATIVE
RBC, UA: NEGATIVE
Specific Gravity, UA: 1.02 (ref 1.005–1.030)
Urobilinogen, Ur: 1 mg/dL (ref 0.2–1.0)
pH, UA: 6 (ref 5.0–7.5)

## 2024-04-03 LAB — MICROSCOPIC EXAMINATION: Bacteria, UA: NONE SEEN

## 2024-04-03 MED ORDER — LEVOFLOXACIN 750 MG PO TABS: 750.0000 mg | ORAL_TABLET | Freq: Every day | ORAL | 0 refills | Status: DC

## 2024-04-03 MED ORDER — ALFUZOSIN HCL ER 10 MG PO TB24: 10.0000 mg | ORAL_TABLET | Freq: Every day | ORAL | 11 refills | Status: AC

## 2024-04-03 NOTE — Patient Instructions (Signed)
 Prostate Biopsy Instructions:  Appointment Time: 8:15 Appointment Date: 05/08/2024 Location: [facility]   Please note this procedure is completed at [facility] Department. Please arrive 15 minutes prior to your scheduled visit time to allow registration time. If you are late for your scheduled visit time, you may be asked to reschedule due to time restraints.  You will need to stop all aspirin  or blood thinners (aspirin , plavix, coumadin, warfarin, motrin, ibuprofen, advil, aleve, naproxen, naprosyn) for 7 days prior to the procedure.  If you have any questions about stopping these medications, please contact your prescribing doctor.  Our office will obtain clearance for your blood thinners to be held- please do not stop these medications until you have been given instruction to do so.  Having a light meal prior to the procedure is recommended.  If you are diabetic or have low blood sugar please bring a small snack or glucose tablet.  A Fleets enema is needed to be purchased over the counter at a local pharmacy and used 2 hours before you scheduled appointment.  This can be purchased over the counter at any pharmacy.  One antibiotic tablet has been sent to your pharmacy. Please pick this prescription up and take 2 hours prior to your scheduled biopsy appointment. You will also be given an additional antibiotic injection at the biopsy appointment.   Please bring someone with you to the procedure to drive you home if you are given a valium to take prior to your procedure.   If you have any questions or concerns, please feel free to call the office at [phone] or send a Mychart message.  What you can expect after your prostate biopsy:  You may notice blood in your urine or stool up to 1 week after your procedure. You may notice blood in your semen up to 1 month after your procedure.  If you are unable to pee afterwards or develop a fever, please call our  office at [phone] and hit option 3 to speak with a clinical staff member.  Your physicain will notify you of your results.    Thank you, [facility]

## 2024-04-03 NOTE — Progress Notes (Signed)
 04/03/2024 12:03 PM   Francis Hall 1956-04-22 [id number]  Referring provider: Marvine Rush, MD [address] [place],  [place] [address]  Elevated PSA   HPI: Mr Francis Hall is a 34bn here for followup for elevated PSa and BPH with incomplete emptying. PSA increase to 8.2 from 5.7. IPSS 11 QOL 1. No straining to urinate. Uirne stream is fair. Nocturia 1-2x depending on fluid consumption.    PMH: Past Medical History:  Diagnosis Date   Atrial fibrillation (HCC)    CAD (coronary artery disease)    [facility]   CHF (congestive heart failure) (HCC) 11/08/2010   2D Echo EF=>55%   Diabetes mellitus    insulin    Diverticulosis    Dyspnea    Family hx of colon cancer    GERD (gastroesophageal reflux disease)    HTN (hypertension)    Hyperlipemia    Hyperplastic colon polyp 07/16/07   30cm    Myocardial infarction (HCC) 12/2012   OSA (obstructive sleep apnea) 12/21/2007   sleep study perform REM REM and NREM was 95.0%, AHI of 12.31hr and RDI of 12.4hr    Surgical History: Past Surgical History:  Procedure Laterality Date   CARDIAC CATHETERIZATION  2006 2011   2 stents   CATARACT EXTRACTION W/PHACO Left 12/03/2013   Procedure: CATARACT EXTRACTION PHACO AND INTRAOCULAR LENS PLACEMENT (IOC);  Surgeon: Francis Mania, MD;  Location: [facility];  Service: Ophthalmology;  Laterality: Left;  CDE:7.07   CATARACT EXTRACTION W/PHACO Right 12/14/2013   Procedure: CATARACT EXTRACTION PHACO AND INTRAOCULAR LENS PLACEMENT (IOC);  Surgeon: Francis Mania, MD;  Location: [facility];  Service: Ophthalmology;  Laterality: Right;  CDE 7.84    COLONOSCOPY  07/2007   Dr Francis Hall: hyperplastic polyp 30cm   COLONOSCOPY N/A 08/12/2012   Procedure: COLONOSCOPY;  Surgeon: Francis Hall Haddock, MD;  [facility];  Service: Endoscopy;  Laterality: N/A;  8:30   COLONOSCOPY N/A 11/11/2017   Dr. Haddock: multiple tubular adenomas, next surveillance in 2022.    COLONOSCOPY WITH PROPOFOL  N/A 01/10/2021   Procedure:  COLONOSCOPY WITH PROPOFOL ;  Surgeon: Francis Carlin POUR, DO;  [facility];  Service: Endoscopy;  Laterality: N/A;  8:45 / ASA II   CORONARY ARTERY BYPASS GRAFT  06/2004   x2 performed for 40% left main stenosis, 80% proximal LAD stenosis of the first diagonal artery, and 50% left circumfles artery priximal stenosis.LIMA to LAD and free radial artery to circumflex coronary artery   LEFT HEART CATHETERIZATION WITH CORONARY/GRAFT ANGIOGRAM  12/29/2012   Procedure: LEFT HEART CATHETERIZATION WITH EL BILE;  Surgeon: Francis LELON Clay, MD;  Location: [facility] CATH LAB;  Service: Cardiovascular;;   POLYPECTOMY  11/11/2017   Procedure: POLYPECTOMY;  Surgeon: Hall Francis LITTIE, MD;  [facility];  Service: Endoscopy;;  ascending colon, transverse, splenic flexure, sigmoid   POLYPECTOMY  01/10/2021   Procedure: POLYPECTOMY;  Surgeon: Francis Carlin POUR, DO;  [facility];  Service: Endoscopy;;   TONSILLECTOMY  1962    Home Medications:  Allergies as of 04/03/2024       Reactions   Crestor [rosuvastatin] Other (See Comments)   myalgia        Medication List        Accurate as of April 03, 2024 12:03 PM. If you have any questions, ask your nurse or doctor.          acetaminophen  325 MG tablet Commonly known as: TYLENOL  Take 650 mg by mouth as  needed for moderate pain or headache.   alfuzosin  10 MG 24 hr tablet Commonly known as: UROXATRAL  Take 1 tablet (10 mg total) by mouth at bedtime.   amLODipine  10 MG tablet Commonly known as: NORVASC  TAKE 1 TABLET(10 MG) BY MOUTH DAILY   ascorbic acid 500 MG tablet Commonly known as: VITAMIN C Take 500 mg by mouth daily.   aspirin  EC 81 MG tablet Take 81 mg by mouth daily.   blood glucose meter kit and supplies 1 each by Other route 4 (four) times daily. Dispense based on patient and insurance preference. Use up to four times daily as directed. (FOR ICD-10 E10.9, E11.9).   blood glucose meter kit and  supplies Kit Dispense based on patient and insurance preference. Use up to four times daily as directed. (FOR ICD-10 E11.65)   carvedilol  12.5 MG tablet Commonly known as: COREG  TAKE 1 TABLET(12.5 MG) BY MOUTH TWICE DAILY WITH A MEAL   fenofibrate  48 MG tablet Commonly known as: TRICOR  TAKE 1 TABLET(48 MG) BY MOUTH DAILY   hydrochlorothiazide  25 MG tablet Commonly known as: HYDRODIURIL  TAKE 1 TABLET(25 MG) BY MOUTH DAILY   insulin  lispro 100 UNIT/ML KwikPen Commonly known as: HumaLOG  KwikPen Inject 14-20 Units into the skin 3 (three) times daily.   Lantus  SoloStar 100 UNIT/ML Solostar Pen Generic drug: insulin  glargine Inject 70 Units into the skin at bedtime.   metFORMIN  1000 MG tablet Commonly known as: GLUCOPHAGE  Take 0.5 tablets (500 mg total) by mouth 2 (two) times daily with a meal.   multivitamin capsule Take 1 capsule by mouth daily.   nitroGLYCERIN  0.4 MG SL tablet Commonly known as: Nitrostat  PLACE 1 TABLET UNDER TONGUE IF NEEDED FOR CHEST PAIN.AZALEA OF 3 DOSES   olmesartan  40 MG tablet Commonly known as: BENICAR  Take 1 tablet (40 mg total) by mouth daily.   OneTouch Verio test strip Generic drug: glucose blood USE TO CHECK BLOOD SUGAR FOUR TIMES DAILY AS DIRECTED   pantoprazole 40 MG tablet Commonly known as: PROTONIX Take 40 mg by mouth daily.   Pen Needles 31G X 8 MM Misc Use to inject insulin  4 times daily   Repatha  140 MG/ML Sosy Generic drug: Evolocumab  Inject 140 mg into the skin every 14 (fourteen) days.   Vitamin D3 25 MCG (1000 UT) Caps Take 2 capsules (2,000 Units total) by mouth daily.        Allergies:  Allergies  Allergen Reactions   Crestor [Rosuvastatin] Other (See Comments)    myalgia    Family History: Family History  Problem Relation Age of Onset   Colon cancer Father        1's?   Lung cancer Father    Coronary artery disease Father 66       MI   Arthritis Other    Heart disease Other    Diabetes Other      Social History:  reports that he has never smoked. He has never used smokeless tobacco. He reports current alcohol use. He reports that he does not use drugs.  ROS: All other review of systems were reviewed and are negative except what is noted above in HPI  Physical Exam: BP 128/65   Pulse 65   Constitutional:  Alert and oriented, No acute distress. HEENT:  AT, moist mucus membranes.  Trachea midline, no masses. Cardiovascular: No clubbing, cyanosis, or edema. Respiratory: Normal respiratory effort, no increased work of breathing. GI: Abdomen is soft, nontender, nondistended, no abdominal masses GU: No CVA tenderness.  Lymph: No cervical or inguinal lymphadenopathy. Skin: No rashes, bruises or suspicious lesions. Neurologic: Grossly intact, no focal deficits, moving all 4 extremities. Psychiatric: Normal mood and affect.  Laboratory Data: Lab Results  Component Value Date   WBC 6.6 12/30/2012   HGB 12.6 (L) 11/27/2013   HCT 36.1 (L) 11/27/2013   MCV 87.9 12/30/2012   PLT 168 12/30/2012    Lab Results  Component Value Date   CREATININE 1.47 (H) 09/17/2023    No results found for: PSA  No results found for: TESTOSTERONE  Lab Results  Component Value Date   HGBA1C 7.6 (A) 01/20/2024    Urinalysis    Component Value Date/Time   APPEARANCEUR Clear 09/30/2023 1323   GLUCOSEU Negative 09/30/2023 1323   BILIRUBINUR Negative 09/30/2023 1323   PROTEINUR 2+ (A) 09/30/2023 1323   NITRITE Negative 09/30/2023 1323   LEUKOCYTESUR Negative 09/30/2023 1323    Lab Results  Component Value Date   LABMICR See below: 09/30/2023   WBCUA 0-5 09/30/2023   LABEPIT 0-10 09/30/2023   MUCUS Present 04/11/2021   BACTERIA None seen 09/30/2023    Pertinent Imaging:  No results found for this or any previous visit.  No results found for this or any previous visit.  No results found for this or any previous visit.  No results found for this or any previous  visit.  No results found for this or any previous visit.  No results found for this or any previous visit.  No results found for this or any previous visit.  No results found for this or any previous visit.   Assessment & Plan:    1. Elevated PSA (Primary) The patient and I talked about etiologies of elevated PSA.  We discussed the possible relationship between elevated PSA, prostate cancer, BPH, prostatitis, and UTI.   Conservative treatment of elevated PSA with watchful waiting was discussed with the patient.  All questions were answered.        All of the risks and benefits along with alternatives to prostate biopsy were discussed with the patient.  The patient gave fully informed consent to proceed with a transrectal ultrasound guided biopsy of the prostate for the evaluation of their evated PSA.  Prostate biopsy instructions and antibiotics were given to the patient.   2. Incomplete bladder emptying Uroxatral  10mg  qhs   No follow-ups on file.  Belvie Clara, MD  [facility] [place]

## 2024-04-03 NOTE — Telephone Encounter (Signed)
   Pre-operative Risk Assessment    Patient Name: Francis Hall  DOB: 12/03/1955 MRN: [id number]   Date of last office visit: 10/02/23 DR. CROITORU Date of next office visit: NONE   Request for Surgical Clearance    Procedure:  PROSTATE Bx   Date of Surgery:  Clearance 05/07/24                                Surgeon:  DR. PATRICK MCKENZIE Surgeon's Group or Practice Name: [facility] Phone number:  [phone] Fax number:  [phone] ATTN: HOPE LPN   Type of Clearance Requested:   - Medical  - Pharmacy:  Hold Aspirin  x 7 DAYS PRIOR   Type of Anesthesia:  Local    Additional requests/questions:    [name]   04/03/2024, 4:17 PM

## 2024-04-06 ENCOUNTER — Telehealth: Payer: Self-pay

## 2024-04-06 NOTE — Telephone Encounter (Signed)
   Name: Francis Hall  DOB: 09-09-1955  MRN: [id number]  Primary Cardiologist: Jerel Balding, MD   Preoperative team, please contact this patient and set up a phone call appointment for further preoperative risk assessment. Please obtain consent and complete medication review. Thank you for your help.  I confirm that guidance regarding antiplatelet and oral anticoagulation therapy has been completed and, if necessary, noted below.   Okay to hold Aspirin  5-7 days prior to prostate biopsy per Dr. Balding.   I also confirmed the patient resides in the state of [place] . As per [facility] Medical Board telemedicine laws, the patient must reside in the state in which the provider is licensed.   [name], NP 04/06/2024, 1:11 PM [facility]

## 2024-04-06 NOTE — Telephone Encounter (Signed)
 Dr. Francyne , patient's chart was reviewed for preoperative cardiac evaluation.  He has history of:   Francis Hall had multivessel bypass surgery in 2006 (LIMA to LAD, free radial to circumflex) and in 2014 presented with a small non-STEMI and required percutaneous revascularization (proximal posterolateral artery 2.25 x 16 mm Promus and mid right coronary artery 2.5 x 38 mm Promus and balloon angioplasty of the ostium of the PDA). He is a Plavix nonresponder, on ticagrelor  chronically   Due to stent length, can you please commend on agreement to hold aspirin  for 7 days prior to procedure?  Please route your response to p cv div preop.  Thank you, Francis EMERSON Bane, NP-C 04/06/2024, 11:03 AM

## 2024-04-06 NOTE — Telephone Encounter (Signed)
 Patient has been scheduled for televisit, med rec, and consent done.   Patient Consent for Virtual Visit        Francis Hall has provided verbal consent on 04/06/2024 for a virtual visit (video or telephone).   CONSENT FOR VIRTUAL VISIT FOR:  Francis Hall  By participating in this virtual visit I agree to the following:  I hereby voluntarily request, consent and authorize [facility] and its employed or contracted physicians, physician assistants, nurse practitioners or other licensed health care professionals (the Practitioner), to provide me with telemedicine health care services (the "Services) as deemed necessary by the treating Practitioner. I acknowledge and consent to receive the Services by the Practitioner via telemedicine. I understand that the telemedicine visit will involve communicating with the Practitioner through live audiovisual communication technology and the disclosure of certain medical information by electronic transmission. I acknowledge that I have been given the opportunity to request an in-person assessment or other available alternative prior to the telemedicine visit and am voluntarily participating in the telemedicine visit.  I understand that I have the right to withhold or withdraw my consent to the use of telemedicine in the course of my care at any time, without affecting my right to future care or treatment, and that the Practitioner or I may terminate the telemedicine visit at any time. I understand that I have the right to inspect all information obtained and/or recorded in the course of the telemedicine visit and may receive copies of available information for a reasonable fee.  I understand that some of the potential risks of receiving the Services via telemedicine include:  Delay or interruption in medical evaluation due to technological equipment failure or disruption; Information transmitted may not be sufficient (e.g. poor resolution of images) to  allow for appropriate medical decision making by the Practitioner; and/or  In rare instances, security protocols could fail, causing a breach of personal health information.  Furthermore, I acknowledge that it is my responsibility to provide information about my medical history, conditions and care that is complete and accurate to the best of my ability. I acknowledge that Practitioner's advice, recommendations, and/or decision may be based on factors not within their control, such as incomplete or inaccurate data provided by me or distortions of diagnostic images or specimens that may result from electronic transmissions. I understand that the practice of medicine is not an exact science and that Practitioner makes no warranties or guarantees regarding treatment outcomes. I acknowledge that a copy of this consent can be made available to me via my patient portal [facility] MyChart), or I can request a printed copy by calling the office of [facility].    I understand that my insurance will be billed for this visit.   I have read or had this consent read to me. I understand the contents of this consent, which adequately explains the benefits and risks of the Services being provided via telemedicine.  I have been provided ample opportunity to ask questions regarding this consent and the Services and have had my questions answered to my satisfaction. I give my informed consent for the services to be provided through the use of telemedicine in my medical care

## 2024-04-06 NOTE — Telephone Encounter (Signed)
 Okay to hold aspirin  for 5-7 days before prostate biopsy

## 2024-04-06 NOTE — Telephone Encounter (Signed)
 Patient has been scheduled for televisit, med rec, and consent done.

## 2024-04-07 ENCOUNTER — Encounter: Payer: Self-pay | Admitting: Urology

## 2024-04-08 DIAGNOSIS — E1165 Type 2 diabetes mellitus with hyperglycemia: Secondary | ICD-10-CM | POA: Diagnosis not present

## 2024-04-24 ENCOUNTER — Ambulatory Visit: Attending: Internal Medicine | Admitting: Physician Assistant

## 2024-04-24 DIAGNOSIS — Z01810 Encounter for preprocedural cardiovascular examination: Secondary | ICD-10-CM

## 2024-04-24 NOTE — Progress Notes (Signed)
 Virtual Visit via Telephone Note   Because of NIJEE HEATWOLE co-morbid illnesses, he is at least at moderate risk for complications without adequate follow up.  This format is felt to be most appropriate for this patient at this time.  Due to technical limitations with video connection (technology), today's appointment will be conducted as an audio only telehealth visit, and Francis Hall verbally agreed to proceed in this manner.   All issues noted in this document were discussed and addressed.  No physical exam could be performed with this format.  Evaluation Performed:  Preoperative cardiovascular risk assessment _____________   Date:  04/24/2024   Patient ID:  Francis Hall, DOB January 10, 1956, MRN [id number] Patient Location:  Home Provider location:   Office  Primary Care Provider:  Marvine Rush, MD Primary Cardiologist:  Jerel Balding, MD  Chief Complaint / Patient Profile   68 y.o. y/o male with a h/o CAD with PCI, CHF, GERD, HTN, HLD, OSA, DM  who is pending prostate Bx and presents today for telephonic preoperative cardiovascular risk assessment.  History of Present Illness    Francis Hall is a 68 y.o. male who presents via audio/video conferencing for a telehealth visit today.  Pt was last seen in cardiology clinic on 10/02/23 by Dr. Balding.  At that time Francis Hall was doing well.  The patient is now pending procedure as outlined above. Since his last visit, he tells me that he has had no new issues with his heart. Sometime he does have some swelling. 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   Okay to hold Aspirin  5-7 days prior to prostate biopsy per Dr. Balding. Please resume when medically safe to do so.   Past Medical History    Past Medical History:  Diagnosis Date   Atrial fibrillation (HCC)    CAD (coronary artery disease)    [facility]   CHF (congestive heart failure) (HCC) 11/08/2010   2D Echo EF=>55%   Diabetes mellitus     insulin    Diverticulosis    Dyspnea    Family hx of colon cancer    GERD (gastroesophageal reflux disease)    HTN (hypertension)    Hyperlipemia    Hyperplastic colon polyp 07/16/07   30cm    Myocardial infarction (HCC) 12/2012   OSA (obstructive sleep apnea) 12/21/2007   sleep study perform REM REM and NREM was 95.0%, AHI of 12.31hr and RDI of 12.4hr   Past Surgical History:  Procedure Laterality Date   CARDIAC CATHETERIZATION  2006 2011   2 stents   CATARACT EXTRACTION W/PHACO Left 12/03/2013   Procedure: CATARACT EXTRACTION PHACO AND INTRAOCULAR LENS PLACEMENT (IOC);  Surgeon: Cherene Mania, MD;  Location: [facility];  Service: Ophthalmology;  Laterality: Left;  CDE:7.07   CATARACT EXTRACTION W/PHACO Right 12/14/2013   Procedure: CATARACT EXTRACTION PHACO AND INTRAOCULAR LENS PLACEMENT (IOC);  Surgeon: Cherene Mania, MD;  Location: [facility];  Service: Ophthalmology;  Laterality: Right;  CDE 7.84    COLONOSCOPY  07/2007   Dr Mavis: hyperplastic polyp 30cm   COLONOSCOPY N/A 08/12/2012   Procedure: COLONOSCOPY;  Surgeon: Margo LITTIE Haddock, MD;  [facility];  Service: Endoscopy;  Laterality: N/A;  8:30   COLONOSCOPY N/A 11/11/2017   Dr. Haddock: multiple tubular adenomas, next surveillance in 2022.    COLONOSCOPY WITH PROPOFOL  N/A 01/10/2021   Procedure: COLONOSCOPY WITH PROPOFOL ;  Surgeon: Cindie Carlin POUR, DO;  [facility];  Service: Endoscopy;  Laterality: N/A;  8:45 / ASA II   CORONARY ARTERY BYPASS GRAFT  06/2004   x2 performed for 40% left main stenosis, 80% proximal LAD stenosis of the first diagonal artery, and 50% left circumfles artery priximal stenosis.LIMA to LAD and free radial artery to circumflex coronary artery   LEFT HEART CATHETERIZATION WITH CORONARY/GRAFT ANGIOGRAM  12/29/2012   Procedure: LEFT HEART CATHETERIZATION WITH EL BILE;  Surgeon: Alm LELON Clay, MD;  Location: [facility] CATH LAB;  Service: Cardiovascular;;   POLYPECTOMY  11/11/2017    Procedure: POLYPECTOMY;  Surgeon: Harvey Margo CROME, MD;  [facility];  Service: Endoscopy;;  ascending colon, transverse, splenic flexure, sigmoid   POLYPECTOMY  01/10/2021   Procedure: POLYPECTOMY;  Surgeon: Cindie Carlin POUR, DO;  [facility];  Service: Endoscopy;;   TONSILLECTOMY  1962    Allergies  Allergies  Allergen Reactions   Crestor [Rosuvastatin] Other (See Comments)    myalgia    Home Medications    Prior to Admission medications   Medication Sig Start Date End Date Taking? Authorizing Provider  acetaminophen  (TYLENOL ) 325 MG tablet Take 650 mg by mouth as needed for moderate pain or headache.    [provider]  alfuzosin  (UROXATRAL ) 10 MG 24 hr tablet Take 1 tablet (10 mg total) by mouth at bedtime. 04/03/24   McKenzie, Belvie CROME, MD  amLODipine  (NORVASC ) 10 MG tablet TAKE 1 TABLET(10 MG) BY MOUTH DAILY 01/01/24   Croitoru, Mihai, MD  aspirin  81 MG EC tablet Take 81 mg by mouth daily.    [provider]  blood glucose meter kit and supplies KIT Dispense based on patient and insurance preference. Use up to four times daily as directed. (FOR ICD-10 E11.65) 05/29/18   Nida, Gebreselassie W, MD  blood glucose meter kit and supplies 1 each by Other route 4 (four) times daily. Dispense based on patient and insurance preference. Use up to four times daily as directed. (FOR ICD-10 E10.9, E11.9). 09/28/19   Nida, Gebreselassie W, MD  carvedilol  (COREG ) 12.5 MG tablet TAKE 1 TABLET(12.5 MG) BY MOUTH TWICE DAILY WITH A MEAL 10/25/23   Croitoru, Mihai, MD  Cholecalciferol (VITAMIN D3) 25 MCG (1000 UT) CAPS Take 2 capsules (2,000 Units total) by mouth daily. Patient not taking: Reported on 01/20/2024 07/17/19   Croitoru, Mihai, MD  Evolocumab  (REPATHA ) 140 MG/ML SOSY Inject 140 mg into the skin every 14 (fourteen) days. 08/21/22   Croitoru, Mihai, MD  fenofibrate  (TRICOR ) 48 MG tablet TAKE 1 TABLET(48 MG) BY MOUTH DAILY 10/03/23   Croitoru, Mihai, MD   glucose blood (ONETOUCH VERIO) test strip USE TO CHECK BLOOD SUGAR FOUR TIMES DAILY AS DIRECTED 03/23/22   Therisa Benton PARAS, NP  hydrochlorothiazide  (HYDRODIURIL ) 25 MG tablet TAKE 1 TABLET(25 MG) BY MOUTH DAILY 03/03/24   Croitoru, Mihai, MD  insulin  glargine (LANTUS  SOLOSTAR) 100 UNIT/ML Solostar Pen Inject 70 Units into the skin at bedtime. 01/20/24   Therisa Benton PARAS, NP  insulin  lispro (HUMALOG  KWIKPEN) 100 UNIT/ML KwikPen Inject 14-20 Units into the skin 3 (three) times daily. 01/20/24   Therisa Benton PARAS, NP  Insulin  Pen Needle (PEN NEEDLES) 31G X 8 MM MISC Use to inject insulin  4 times daily 01/20/24   Therisa Benton PARAS, NP  levofloxacin  (LEVAQUIN ) 750 MG tablet Take 1 tablet (750 mg total) by mouth daily. Take 1 hour prior to your prostate biopsy procedure. 04/03/24   McKenzie, Belvie CROME, MD  metFORMIN  (GLUCOPHAGE ) 1000  MG tablet Take 0.5 tablets (500 mg total) by mouth 2 (two) times daily with a meal. 01/20/24   Therisa Benton PARAS, NP  Multiple Vitamin (MULTIVITAMIN) capsule Take 1 capsule by mouth daily.    [provider]  nitroGLYCERIN  (NITROSTAT ) 0.4 MG SL tablet PLACE 1 TABLET UNDER TONGUE IF NEEDED FOR CHEST PAIN.AZALEA OF 3 DOSES 08/02/20   Croitoru, Mihai, MD  olmesartan  (BENICAR ) 40 MG tablet Take 1 tablet (40 mg total) by mouth daily. 10/02/23   Croitoru, Mihai, MD  pantoprazole (PROTONIX) 40 MG tablet Take 40 mg by mouth daily.    [provider]  vitamin C (ASCORBIC ACID) 500 MG tablet Take 500 mg by mouth daily.    [provider]    Physical Exam    Vital Signs:  Francis Hall does not have vital signs available for review today.  Given telephonic nature of communication, physical exam is limited. AAOx3. NAD. Normal affect.  Speech and respirations are unlabored.  Accessory Clinical Findings    None  Assessment & Plan    1.  Preoperative Cardiovascular Risk Assessment:  Francis Hall perioperative risk of a major cardiac event is 6.6%  according to the Revised Cardiac Risk Index (RCRI).  Therefore, he is at high risk for perioperative complications.   His functional capacity is good at 5.62 METs according to the Duke Activity Status Index (DASI). Recommendations: According to ACC/AHA guidelines, no further cardiovascular testing needed.  The patient may proceed to surgery at acceptable risk.   Antiplatelet and/or Anticoagulation Recommendations: Aspirin  can be held for 5-7 days prior to his surgery.  Please resume Aspirin  post operatively when it is felt to be safe from a bleeding standpoint.   The patient was advised that if he develops new symptoms prior to surgery to contact our office to arrange for a follow-up visit, and he verbalized understanding.  A copy of this note will be routed to requesting surgeon.  Time:   Today, I have spent 6 minutes with the patient with telehealth technology discussing medical history, symptoms, and management plan.     Francis LOISE Fabry, PA-C  04/24/2024, 10:10 AM

## 2024-05-04 ENCOUNTER — Telehealth: Payer: Self-pay

## 2024-05-04 NOTE — Telephone Encounter (Signed)
 Patients wife called to confirm the day of Francis Hall prostate biopsy, the after visit summary says 12/05 and his appt alert says 12/08.  I confirmed that his biopsy is scheduled for 12/08 and that his clearance to hold asa was received so he would need to hold asa 81 starting today.  Wife also reminded to take the Levaquin  abx 1 hour prior to procedure, she voiced understanding nothing further needed at this time.

## 2024-05-11 ENCOUNTER — Other Ambulatory Visit (HOSPITAL_COMMUNITY)

## 2024-05-11 ENCOUNTER — Encounter: Payer: Self-pay | Admitting: Urology

## 2024-05-11 ENCOUNTER — Other Ambulatory Visit: Payer: Self-pay | Admitting: Urology

## 2024-05-11 ENCOUNTER — Ambulatory Visit: Admitting: Urology

## 2024-05-11 ENCOUNTER — Ambulatory Visit (HOSPITAL_COMMUNITY)
Admission: RE | Admit: 2024-05-11 | Discharge: 2024-05-11 | Disposition: A | Source: Ambulatory Visit | Attending: Urology

## 2024-05-11 ENCOUNTER — Encounter (HOSPITAL_COMMUNITY): Payer: Self-pay

## 2024-05-11 VITALS — BP 133/61 | HR 73 | Temp 98.0°F | Resp 18

## 2024-05-11 DIAGNOSIS — R972 Elevated prostate specific antigen [PSA]: Secondary | ICD-10-CM

## 2024-05-11 MED ORDER — LIDOCAINE HCL (PF) 2 % IJ SOLN
10.0000 mL | Freq: Once | INTRAMUSCULAR | Status: AC
  Administered 2024-05-11: 10 mL

## 2024-05-11 MED ORDER — GENTAMICIN SULFATE 40 MG/ML IJ SOLN
INTRAMUSCULAR | Status: AC
  Filled 2024-05-11: qty 2

## 2024-05-11 MED ORDER — GENTAMICIN SULFATE 40 MG/ML IJ SOLN
80.0000 mg | Freq: Once | INTRAMUSCULAR | Status: AC
  Administered 2024-05-11: 80 mg via INTRAMUSCULAR

## 2024-05-11 MED ORDER — LIDOCAINE HCL (PF) 2 % IJ SOLN
INTRAMUSCULAR | Status: AC
  Filled 2024-05-11: qty 10

## 2024-05-11 NOTE — Patient Instructions (Signed)
 Transrectal Ultrasound-Guided Prostate Biopsy, Care After What can I expect after the procedure? After the procedure, it is common to have: Pain and discomfort near your butt (rectum), especially while sitting. Pink-colored pee (urine). This is due to small amounts of blood in your pee. A burning feeling while peeing. Blood in your poop (stool). Bleeding from your butt. Blood in your semen. Follow these instructions at home: Medicines Take over-the-counter and prescription medicines only as told by your doctor. If you were given a sedative during your procedure, do not drive or use machines until your doctor says that it is safe. A sedative is a medicine that helps you relax. If you were prescribed an antibiotic medicine, take it as told by your doctor. Do not stop taking it even if you start to feel better. Activity  Return to your normal activities when your doctor says that it is safe. Ask your doctor when it is okay for you to have sex. You may have to avoid lifting. Ask your doctor how much you can safely lift. General instructions  Drink enough water to keep your pee pale yellow. Watch your pee, poop, and semen for new bleeding or bleeding that gets worse. Keep all follow-up visits. Contact a doctor if: You have any of these: Blood clots in your pee or poop. Blood in your pee more than 2 weeks after the procedure. Blood in your semen more than 2 months after the procedure. New or worse bleeding in your pee, poop, or semen. Very bad belly pain. Your pee smells bad or unusual. You have trouble peeing. Your lower belly feels firm. You have problems getting an erection. You feel like you may vomit (are nauseous), or you vomit. Get help right away if: You have a fever or chills. You have bright red pee. You have very bad pain that does not get better with medicine. You cannot pee. Summary After this procedure, it is common to have pain and discomfort near your butt,  especially while sitting. You may have blood in your pee and poop. It is common to have blood in your semen. Get help right away if you have a fever or chills. This information is not intended to replace advice given to you by your health care provider. Make sure you discuss any questions you have with your health care provider. Document Revised: 11/14/2020 Document Reviewed: 11/14/2020 [employer]vier Patient Education  2024 [employer].

## 2024-05-11 NOTE — Progress Notes (Signed)
 PT tolerated prostate biopsy procedure and antibiotic injection well today. Labs obtained and sent for pathology by Richard from ultrasound. PT ambulatory at discharge with no acute distress noted and verbalized understanding of discharge instructions. PT to follow up with urologist as scheduled on 05/20/24 @3 :00.

## 2024-05-11 NOTE — Progress Notes (Signed)
 Prostate Biopsy Procedure   Informed consent was obtained after discussing risks/benefits of the procedure.  A time out was performed to ensure correct patient identity.  Pre-Procedure: - Last PSA Level: No results found for: PSA - Gentamicin  given prophylactically - Levaquin  500 mg administered PO -Transrectal Ultrasound performed revealing a 130.8 gm prostate -No significant hypoechoic or median lobe noted  Procedure: - Prostate block performed using 10 cc 1% lidocaine  and biopsies taken from sextant areas, a total of 12 under ultrasound guidance.  Post-Procedure: - Patient tolerated the procedure well - He was counseled to seek immediate medical attention if experiences any severe pain, significant bleeding, or fevers - Return in one week to discuss biopsy results

## 2024-05-12 DIAGNOSIS — H5203 Hypermetropia, bilateral: Secondary | ICD-10-CM | POA: Diagnosis not present

## 2024-05-12 LAB — SURGICAL PATHOLOGY

## 2024-05-20 ENCOUNTER — Ambulatory Visit: Admitting: Urology

## 2024-05-21 ENCOUNTER — Ambulatory Visit: Admitting: Nurse Practitioner

## 2024-06-17 ENCOUNTER — Encounter: Payer: Self-pay | Admitting: Nurse Practitioner

## 2024-06-17 ENCOUNTER — Ambulatory Visit: Admitting: Nurse Practitioner

## 2024-06-17 VITALS — BP 140/62 | HR 75 | Ht 67.0 in | Wt 204.2 lb

## 2024-06-17 DIAGNOSIS — E782 Mixed hyperlipidemia: Secondary | ICD-10-CM

## 2024-06-17 DIAGNOSIS — E1159 Type 2 diabetes mellitus with other circulatory complications: Secondary | ICD-10-CM

## 2024-06-17 DIAGNOSIS — Z7984 Long term (current) use of oral hypoglycemic drugs: Secondary | ICD-10-CM | POA: Diagnosis not present

## 2024-06-17 DIAGNOSIS — T466X5A Adverse effect of antihyperlipidemic and antiarteriosclerotic drugs, initial encounter: Secondary | ICD-10-CM

## 2024-06-17 DIAGNOSIS — I10 Essential (primary) hypertension: Secondary | ICD-10-CM | POA: Diagnosis not present

## 2024-06-17 DIAGNOSIS — Z794 Long term (current) use of insulin: Secondary | ICD-10-CM | POA: Diagnosis not present

## 2024-06-17 MED ORDER — LANTUS SOLOSTAR 100 UNIT/ML ~~LOC~~ SOPN: 70.0000 [IU] | PEN_INJECTOR | Freq: Every day | SUBCUTANEOUS | 3 refills | Status: AC

## 2024-06-17 MED ORDER — INSULIN LISPRO (1 UNIT DIAL) 100 UNIT/ML (KWIKPEN): 14.0000 [IU] | PEN_INJECTOR | Freq: Three times a day (TID) | SUBCUTANEOUS | 3 refills | Status: AC

## 2024-06-17 MED ORDER — METFORMIN HCL 1000 MG PO TABS: 500.0000 mg | ORAL_TABLET | Freq: Two times a day (BID) | ORAL | 1 refills | Status: AC

## 2024-06-17 NOTE — Progress Notes (Signed)
 "         06/17/2024   Endocrinology follow-up note   Subjective:    Patient ID: Francis Hall, male    DOB: March 28, 1956,    Past Medical History:  Diagnosis Date   Atrial fibrillation (HCC)    CAD (coronary artery disease)    [facility]   CHF (congestive heart failure) (HCC) 11/08/2010   2D Echo EF=>55%   Diabetes mellitus    insulin    Diverticulosis    Dyspnea    Family hx of colon cancer    GERD (gastroesophageal reflux disease)    HTN (hypertension)    Hyperlipemia    Hyperplastic colon polyp 07/16/07   30cm    Myocardial infarction (HCC) 12/2012   OSA (obstructive sleep apnea) 12/21/2007   sleep study perform REM REM and NREM was 95.0%, AHI of 12.31hr and RDI of 12.4hr   Past Surgical History:  Procedure Laterality Date   CARDIAC CATHETERIZATION  2006 2011   2 stents   CATARACT EXTRACTION W/PHACO Left 12/03/2013   Procedure: CATARACT EXTRACTION PHACO AND INTRAOCULAR LENS PLACEMENT (IOC);  Surgeon: Cherene Mania, MD;  Location: [facility];  Service: Ophthalmology;  Laterality: Left;  CDE:7.07   CATARACT EXTRACTION W/PHACO Right 12/14/2013   Procedure: CATARACT EXTRACTION PHACO AND INTRAOCULAR LENS PLACEMENT (IOC);  Surgeon: Cherene Mania, MD;  Location: [facility];  Service: Ophthalmology;  Laterality: Right;  CDE 7.84    COLONOSCOPY  07/2007   Dr Mavis: hyperplastic polyp 30cm   COLONOSCOPY N/A 08/12/2012   Procedure: COLONOSCOPY;  Surgeon: Margo LITTIE Haddock, MD;  Location: [facility];  Service: Endoscopy;  Laterality: N/A;  8:30   COLONOSCOPY N/A 11/11/2017   Dr. Haddock: multiple tubular adenomas, next surveillance in 2022.    COLONOSCOPY WITH PROPOFOL  N/A 01/10/2021   Procedure: COLONOSCOPY WITH PROPOFOL ;  Surgeon: Cindie Carlin POUR, DO;  [facility]: [facility];  Service: Endoscopy;  Laterality: N/A;  8:45 / ASA II   CORONARY ARTERY BYPASS GRAFT  06/2004   x2 performed for 40% left main stenosis, 80% proximal LAD stenosis of the first diagonal artery, and 50% left circumfles  artery priximal stenosis.LIMA to LAD and free radial artery to circumflex coronary artery   LEFT HEART CATHETERIZATION WITH CORONARY/GRAFT ANGIOGRAM  12/29/2012   Procedure: LEFT HEART CATHETERIZATION WITH EL BILE;  Surgeon: Francis LELON Clay, MD;  Location: [facility] CATH LAB;  Service: Cardiovascular;;   POLYPECTOMY  11/11/2017   Procedure: POLYPECTOMY;  Surgeon: Haddock Margo LITTIE, MD;  [facility];  Service: Endoscopy;;  ascending colon, transverse, splenic flexure, sigmoid   POLYPECTOMY  01/10/2021   Procedure: POLYPECTOMY;  Surgeon: Cindie Carlin POUR, DO;  Location: [facility];  Service: Endoscopy;;   TONSILLECTOMY  1962   Social History   Socioeconomic History   Marital status: Married    Spouse name: Not on file   Number of children: 2   Years of education: 12   Highest education level: Not on file  Occupational History   Occupation: disabled curator   Tobacco Use   Smoking status: Never   Smokeless tobacco: Never  Vaping Use   Vaping status: Never Used  Substance and Sexual Activity   Alcohol use: Yes    Comment: social-2 beers/wk   Drug use: No   Sexual activity: Yes    Birth control/protection: None  Other Topics Concern   Not on file  Social History Narrative   Not on file   Social Drivers of Health  Tobacco Use: Low Risk (06/17/2024)   Patient History    Smoking Tobacco Use: Never    Smokeless Tobacco Use: Never    Passive Exposure: Not on file  Financial Resource Strain: Low Risk (02/14/2022)   Overall Financial Resource Strain (CARDIA)    Difficulty of Paying Living Expenses: Not hard at all  Food Insecurity: Not on file  Transportation Needs: Not on file  Physical Activity: Not on file  Stress: Not on file  Social Connections: Not on file  Depression (EYV7-0): Not on file  Alcohol Screen: Not on file  Housing: Low Risk (02/14/2022)   Housing    Last Housing Risk Score: 0  Utilities: Not At Risk (02/14/2022)   AHC Utilities     Threatened with loss of utilities: No  Health Literacy: Not on file   Outpatient Encounter Medications as of 06/17/2024  Medication Sig   acetaminophen  (TYLENOL ) 325 MG tablet Take 650 mg by mouth as needed for moderate pain or headache.   alfuzosin  (UROXATRAL ) 10 MG 24 hr tablet Take 1 tablet (10 mg total) by mouth at bedtime.   amLODipine  (NORVASC ) 10 MG tablet TAKE 1 TABLET(10 MG) BY MOUTH DAILY   aspirin  81 MG EC tablet Take 81 mg by mouth daily.   blood glucose meter kit and supplies KIT Dispense based on patient and insurance preference. Use up to four times daily as directed. (FOR ICD-10 E11.65)   blood glucose meter kit and supplies 1 each by Other route 4 (four) times daily. Dispense based on patient and insurance preference. Use up to four times daily as directed. (FOR ICD-10 E10.9, E11.9).   carvedilol  (COREG ) 12.5 MG tablet TAKE 1 TABLET(12.5 MG) BY MOUTH TWICE DAILY WITH A MEAL   Evolocumab  (REPATHA ) 140 MG/ML SOSY Inject 140 mg into the skin every 14 (fourteen) days.   fenofibrate  (TRICOR ) 48 MG tablet TAKE 1 TABLET(48 MG) BY MOUTH DAILY   glucose blood (ONETOUCH VERIO) test strip USE TO CHECK BLOOD SUGAR FOUR TIMES DAILY AS DIRECTED   hydrochlorothiazide  (HYDRODIURIL ) 25 MG tablet TAKE 1 TABLET(25 MG) BY MOUTH DAILY   Insulin  Pen Needle (PEN NEEDLES) 31G X 8 MM MISC Use to inject insulin  4 times daily   Multiple Vitamin (MULTIVITAMIN) capsule Take 1 capsule by mouth daily.   nitroGLYCERIN  (NITROSTAT ) 0.4 MG SL tablet PLACE 1 TABLET UNDER TONGUE IF NEEDED FOR CHEST PAIN.AZALEA OF 3 DOSES   olmesartan  (BENICAR ) 40 MG tablet Take 1 tablet (40 mg total) by mouth daily.   vitamin C (ASCORBIC ACID) 500 MG tablet Take 500 mg by mouth daily.   [DISCONTINUED] insulin  glargine (LANTUS  SOLOSTAR) 100 UNIT/ML Solostar Pen Inject 70 Units into the skin at bedtime.   [DISCONTINUED] insulin  lispro (HUMALOG  KWIKPEN) 100 UNIT/ML KwikPen Inject 14-20 Units into the skin 3 (three) times daily.    [DISCONTINUED] metFORMIN  (GLUCOPHAGE ) 1000 MG tablet Take 0.5 tablets (500 mg total) by mouth 2 (two) times daily with a meal.   Cholecalciferol (VITAMIN D3) 25 MCG (1000 UT) CAPS Take 2 capsules (2,000 Units total) by mouth daily. (Patient not taking: Reported on 06/17/2024)   insulin  glargine (LANTUS  SOLOSTAR) 100 UNIT/ML Solostar Pen Inject 70 Units into the skin at bedtime.   insulin  lispro (HUMALOG  KWIKPEN) 100 UNIT/ML KwikPen Inject 14-20 Units into the skin 3 (three) times daily.   metFORMIN  (GLUCOPHAGE ) 1000 MG tablet Take 0.5 tablets (500 mg total) by mouth 2 (two) times daily with a meal.   pantoprazole (PROTONIX) 40 MG tablet Take 40 mg  by mouth daily.   [DISCONTINUED] levofloxacin  (LEVAQUIN ) 750 MG tablet Take 1 tablet (750 mg total) by mouth daily. Take 1 hour prior to your prostate biopsy procedure. (Patient not taking: Reported on 06/17/2024)   No facility-administered encounter medications on file as of 06/17/2024.   ALLERGIES: Allergies  Allergen Reactions   Crestor [Rosuvastatin] Other (See Comments)    myalgia   VACCINATION STATUS:  There is no immunization history on file for this patient.  Diabetes He presents for his follow-up diabetic visit. He has type 2 diabetes mellitus. Onset time: He was diagnosed at approximate age of 89 years. His disease course has been stable. There are no hypoglycemic associated symptoms. Pertinent negatives for hypoglycemia include no confusion, pallor or seizures. There are no diabetic associated symptoms. Pertinent negatives for diabetes include no fatigue, no polydipsia, no polyphagia, no polyuria and no weakness. There are no hypoglycemic complications. Symptoms are stable. Diabetic complications include heart disease, nephropathy and peripheral neuropathy. Risk factors for coronary artery disease include dyslipidemia, diabetes mellitus, hypertension, male sex, sedentary lifestyle and obesity. Current diabetic treatment includes intensive  insulin  program and oral agent (monotherapy). He is compliant with treatment most of the time. His weight is fluctuating minimally. He is following a generally healthy diet. When asked about meal planning, he reported none. He has had a previous visit with a dietitian. He participates in exercise intermittently. His home blood glucose trend is fluctuating minimally. His overall blood glucose range is 140-180 mg/dl. (He presents today with his logs showing mostly at target glycemic profile overall.  His most recent A1c, checked by PCP on 12/26 was 7.7%, increasing slightly from last visit of 7.6%.  He notes his diet is about the same but he sometimes does eat a little later in the evening than usual.  He did have a prostate biopsy and was on antibiotics for this.  He did note elevation in glucose following that biopsy. He has not used his CGM recently, awaiting shipment of sensors from [employer].) An ACE inhibitor/angiotensin II receptor blocker is being taken. He does not see a podiatrist.Eye exam is current.    Review of systems  Constitutional: + Minimally fluctuating body weight,  current Body mass index is 31.98 kg/m. , no fatigue, no subjective hyperthermia, no subjective hypothermia Eyes: no blurry vision, no xerophthalmia ENT: no sore throat, no nodules palpated in throat, no dysphagia/odynophagia, no hoarseness Cardiovascular: no chest pain, no shortness of breath, no palpitations, no leg swelling Respiratory: no cough, no shortness of breath Gastrointestinal: no nausea/vomiting/diarrhea Musculoskeletal: left wrist pain- in brace Skin: no rashes, no hyperemia Neurological: no tremors, no numbness, no tingling, no dizziness Psychiatric: no depression, no anxiety   Objective:    BP (!) 140/62 (BP Location: Left Arm, Patient Position: Sitting, Cuff Size: Large)   Pulse 75   Ht 5' 7 (1.702 m)   Wt 204 lb 3.2 oz (92.6 kg)   BMI 31.98 kg/m   Wt Readings from Last 3 Encounters:  06/17/24  204 lb 3.2 oz (92.6 kg)  01/20/24 196 lb 6.4 oz (89.1 kg)  10/02/23 197 lb (89.4 kg)    BP Readings from Last 3 Encounters:  06/17/24 (!) 140/62  05/11/24 133/61  04/03/24 128/65      Physical Exam- Limited  Constitutional:  Body mass index is 31.98 kg/m. , not in acute distress, normal state of mind Eyes:  EOMI, no exophthalmos Musculoskeletal: no gross deformities, strength intact in all four extremities, no gross restriction of joint movements  Skin:  no rashes, no hyperemia Neurological: no tremor with outstretched hands   Diabetic Foot Exam - Simple   No data filed     Results for orders placed or performed in visit on 05/11/24  Surgical pathology   Collection Time: 05/11/24 12:00 AM  Result Value Ref Range   SURGICAL PATHOLOGY      SURGICAL PATHOLOGY [facility] [address] [place], [place] [address] Telephone [phone] or [phone] Fax [phone]  REPORT OF SURGICAL PATHOLOGY   Accession #: [id number] Patient Name: RYDELL, WIEGEL Visit # :   MRN: [id number] Physician: Sherrilee Dover DOB/Age 08-03-1955 (Age: 69) Gender: M Collected Date: 05/11/2024 Received Date: 05/11/2024  FINAL DIAGNOSIS       1. LEFT BASE LATERAL,  :       BENIGN PROSTATIC TISSUE       2. RIGHT BASE LATERAL,  :       BENIGN PROSTATIC TISSUE       3. RIGHT MID LATERAL,  :       BENIGN PROSTATIC TISSUE       4. RIGHT APEX LATERAL,  :       BENIGN PROSTATIC TISSUE       5. LEFT MID LATERAL,  :       BENIGN PROSTATIC TISSUE       6. LEFT APEX LATERAL,  :       BENIGN PROSTATIC TISSUE       7. LEFT BASE,  :       BENIGN PROSTATIC TISSUE       8. LEFT MID,  :       BENIGN PROSTATIC TISSUE       9. LEFT APEX,  :       BENIGN PROSTATIC TISSUE       10. RIGHT BA SE,  :       BENIGN PROSTATIC TISSUE       11. RIGHT MID,  :       BENIGN PROSTATIC TISSUE       12. RIGHT APEX,  :       BENIGN PROSTATIC  TISSUE       ELECTRONIC SIGNATURE : Stuart Md, Zhaoli, Sports Administrator, International Aid/development Worker  MICROSCOPIC DESCRIPTION  CASE COMMENTS STAINS USED IN DIAGNOSIS: H&E H&E-2 CUT 1 BLANK SLIDE CUT 2 BLANK SLIDES H&E H&E-2 CUT 1 BLANK SLIDE CUT 2 BLANK SLIDES H&E H&E-2 CUT 1 BLANK SLIDE CUT 2 BLANK SLIDES H&E H&E-2 CUT 1 BLANK SLIDE CUT 2 BLANK SLIDES H&E H&E-2 CUT 1 BLANK SLIDE CUT 2 BLANK SLIDES H&E H&E-2 CUT 1 BLANK SLIDE CUT 2 BLANK SLIDES H&E H&E-2 CUT 1 BLANK SLIDE CUT 2 BLANK SLIDES H&E H&E-2 CUT 1 BLANK SLIDE CUT 2 BLANK SLIDES H&E H&E-2 CUT 1 BLANK SLIDE CUT 2 BLANK SLIDES H&E H&E-2 CUT 1 BLANK SLIDE CUT 2 BLANK SLIDES H&E H&E-2 CUT 1 BLANK SLIDE CUT 2 BLANK SLIDES H&E H&E-2 CUT 1 BLANK SLIDE CUT 2 BLANK SLIDES    CLINICAL HISTORY  SPECIMEN(S) OBTAINED 1. LEFT BASE LATERAL, 2. RI GHT BASE LATERAL, 3. RIGHT MID LATERAL, 4. RIGHT APEX LATERAL, 5. LEFT MID LATERAL, 6. LEFT APEX LATERAL, 7. LEFT BASE, 8. LEFT MID, 9. LEFT APEX, 10. RIGHT BASE, 11. RIGHT MID, 12. RIGHT APEX,  SPECIMEN COMMENTS: SPECIMEN CLINICAL INFORMATION:    Gross Description 1. Received in formalin in a container labeled LLB are 0.1 cm in diameter cores of soft whitish gray tissue.Submitted in  toto in one cassette.Number of biopsies 1  Measurements: 2.0 cm (erh) 2. Received in formalin in a container labeled RLB are 0.1 cm in diameter cores of soft whitish gray tissue.Submitted in toto in one cassette.Number of biopsies 1  Measurements: 1.9 cm (erh) 3. Received in formalin in a container labeled RLM are 0.1 cm in diameter cores of soft whitish gray tissue.Submitted in toto in one cassette.Number of biopsies 1  Measurements: 1.6 cm (erh) 4. Received in formalin in a container labeled RLA are 0.1 cm in diameter cores of soft whitish gray tissue.Submitted in toto in one  cassette.Number of biopsies 1  Measurements: 2.2 cm (erh) 5. Received in formalin in a  container labeled LLM are 0.1 cm in diameter cores of soft whitish gray tissue.Submitted in toto in one cassette.Number of biopsies 1  Measurements: 1.7 cm (erh) 6. Received in formalin in a container labeled LLA are 0.1 cm in diameter cores of soft whitish gray tissue.Submitted in toto in one cassette.Number of biopsies 1  Measurements: 1.7 cm (erh) 7. Received in formalin in a container labeled LB are 0.1 cm in diameter cores of soft whitish gray tissue.Submitted in toto in one cassette.Number of biopsies 1  Measurements: 2.1 cm (erh) 8. Received in formalin in a container labeled LM are 0.1 cm in diameter cores of soft whitish gray tissue.Submitted in toto in one cassette.Number of biopsies 1  Measurements: 2.2 cm (erh) 9. Received in formalin in a container labeled LA are 0.1 cm in diameter cores of soft whitish gray tissue.Submitted in toto in one cassette.Number of biopsies 1  M easurements: 2.2 cm (erh) 10. Received in formalin in a container labeled RB are 0.1 cm in diameter cores of soft whitish gray tissue.Submitted in toto in one cassette.Number of biopsies 1  Measurements: 2.0 cm (erh) 11. Received in formalin in a container labeled RM are 0.1 cm in diameter cores of soft whitish gray tissue.Submitted in toto in one cassette.Number of biopsies 1  Measurements: 2.1 cm (erh) 12. Received in formalin in a container labeled RA are 0.1 cm in diameter cores of soft whitish gray tissue.Submitted in toto in one cassette.Number of biopsies 1  Measurements: 2.2 cm (erh)        Report signed out from the following location(s) [place]. [facility] [address] [place], [place] [address] CLIA #: [id number]  [facility] [address] [place], [place] [address] CLIA #: [id number]    Diabetic Labs (most recent): Lab Results  Component Value Date   HGBA1C 7.6 (A) 01/20/2024   HGBA1C 7.2 (A) 09/24/2023   HGBA1C 7.6 (A) 06/19/2023   MICROALBUR 150  mg/L 03/18/2023   MICROALBUR 150 04/18/2021   Lipid Panel     Component Value Date/Time   CHOL 202 (H) 03/12/2023 1006   TRIG 381 (H) 03/12/2023 1006   HDL 36 (L) 03/12/2023 1006   CHOLHDL 5.6 (H) 03/12/2023 1006   CHOLHDL 7.0 12/29/2012 0438   VLDL UNABLE TO CALCULATE IF TRIGLYCERIDE OVER 400 mg/dL 92/71/7985 9561   LDLCALC 102 (H) 03/12/2023 1006       Latest Ref Rng & Units 09/17/2023   12:56 PM 03/12/2023   10:06 AM 12/13/2021    9:43 AM  CMP  Glucose 70 - 99 mg/dL 64  791  807   BUN 8 - 27 mg/dL 27  20  26    Creatinine 0.76 - 1.27 mg/dL 8.52  8.65  8.73   Sodium 134 - 144  mmol/L 139  139  138   Potassium 3.5 - 5.2 mmol/L 4.8  5.1  4.6   Chloride 96 - 106 mmol/L 101  102  101   CO2 20 - 29 mmol/L 24  22  18    Calcium  8.6 - 10.2 mg/dL 9.6  9.8  9.9   Total Protein 6.0 - 8.5 g/dL 6.9  6.7  6.4   Total Bilirubin 0.0 - 1.2 mg/dL 0.4  0.2  0.2   Alkaline Phos 44 - 121 IU/L 76  75  79   AST 0 - 40 IU/L 23  17  19    ALT 0 - 44 IU/L 28  21  19      Assessment & Plan:   1) Type 2 diabetes mellitus with vascular disease (HCC)  His diabetes is complicated by coronary artery disease status post CABG in 2006 and stent placement in 2014.  He presents today with his logs showing mostly at target glycemic profile overall.  His most recent A1c, checked by PCP on 12/26 was 7.7%, increasing slightly from last visit of 7.6%.  He notes his diet is about the same but he sometimes does eat a little later in the evening than usual.  He did have a prostate biopsy and was on antibiotics for this.  He did note elevation in glucose following that biopsy. He has not used his CGM recently, awaiting shipment of sensors from [employer].  -Recent labs reviewed.    - Francis Hall remains at a high risk for more acute and chronic complications of diabetes which include CAD, CVA, CKD, retinopathy, and neuropathy. These are all discussed in detail with the patient.  - Nutritional counseling repeated/built  upon at each appointment.  - The patient admits there is a room for improvement in their diet and drink choices. -  Suggestion is made for the patient to avoid simple carbohydrates from their diet including Cakes, Sweet Desserts / Pastries, Ice Cream, Soda (diet and regular), Sweet Tea, Candies, Chips, Cookies, Sweet Pastries, Store Bought Juices, Alcohol in Excess of 1-2 drinks a day, Artificial Sweeteners, Coffee Creamer, and Sugar-free Products. This will help patient to have stable blood glucose profile and potentially avoid unintended weight gain.   - I encouraged the patient to switch to unprocessed or minimally processed complex starch and increased protein intake (animal or plant source), fruits, and vegetables.   - Patient is advised to stick to a routine mealtimes to eat 3 meals a day and avoid unnecessary snacks (to snack only to correct hypoglycemia).  - I have approached patient with the following individualized plan to manage diabetes and patient agrees.  -Given his stable, mostly at goal glycemic profile, no changes will be made to his regimen today.  He is advised to continue Lantus  70 units SQ nightly and continue Novolog  14-20 units TID with meals if glucose is above 90 and he is eating (Specific instructions on how to titrate insulin  dosage based on glucose readings given to patient in writing)  He can continue Metformin  500 mg po twice daily with meals (can cut his current 1000 mg tabs in half).  -He is encouraged to continue to monitor glucose 4 times daily, before meals and before bed, and call the clinic if he has readings less than 70 or greater than 300 for 3 tests in a row.  He is advised to continue using his CGM device.  2) Lipids/HPL:  His most recent lipid panel from 05/29/24 shows uncontrolled LDL at 128  and elevated triglycerides at 386 (worsening).  He notes he does not tolerate statins and he is having similar side effects with his Repatha  so he stopped taking it.   He is advised to reach out to his cardiologist to let them know of the side effects and see if there are other options.  He is advised to avoid fried foods and butter.  His PCP is managing this.  3) Hypertension:  His blood pressure is controlled to target.  He is advised to continue meds as prescribed by his PCP.    4) Weight management- His Body mass index is 31.98 kg/m.  He will benefit from moderate weight loss.  Exercise, carbs management discussed with him.  I advised patient to maintain close follow up with Dr. Norleen General for primary care needs.  He is also advised to maintain close follow-up with his cardiologist.    I spent  42  minutes in the care of the patient today including review of labs from CMP, Lipids, Thyroid  Function, Hematology (current and previous including abstractions from other facilities); face-to-face time discussing  his blood glucose readings/logs, discussing hypoglycemia and hyperglycemia episodes and symptoms, medications doses, his options of short and long term treatment based on the latest standards of care / guidelines;  discussion about incorporating lifestyle medicine;  and documenting the encounter. Risk reduction counseling performed per USPSTF guidelines to reduce obesity and cardiovascular risk factors.     Please refer to Patient Instructions for Blood Glucose Monitoring and Insulin /Medications Dosing Guide  in media tab for additional information. Please  also refer to  Patient Self Inventory in the Media  tab for reviewed elements of pertinent patient history.  Francis Hall participated in the discussions, expressed understanding, and voiced agreement with the above plans.  All questions were answered to his satisfaction. he is encouraged to contact clinic should he have any questions or concerns prior to his return visit.   Follow up plan: Return in about 4 months (around 10/15/2024) for Diabetes F/U with A1c in office, No previsit labs, Bring  meter and logs.  [place], [facility] [address] [place], [place] [address] Phone: [phone] Fax: [phone]  06/17/2024, 2:47 PM "

## 2024-06-18 ENCOUNTER — Other Ambulatory Visit: Payer: Self-pay | Admitting: Cardiovascular Disease

## 2024-06-19 NOTE — Telephone Encounter (Signed)
 Labs in 05/29/2024 note from [place] under media

## 2024-06-19 NOTE — Telephone Encounter (Signed)
 Creatine outside of Normal Range  In accordance with refill protocols, please review and address the following requirements before this medication refill can be authorized:  Labs

## 2024-10-15 ENCOUNTER — Ambulatory Visit: Admitting: Nurse Practitioner

## 2024-11-16 ENCOUNTER — Other Ambulatory Visit

## 2024-11-18 ENCOUNTER — Ambulatory Visit: Admitting: Urology
